# Patient Record
Sex: Male | Born: 1950 | Race: White | Hispanic: No | Marital: Married | Smoking: Former smoker
Health system: Southern US, Community
[De-identification: ages and names within clinical notes are randomized; demographics above are authoritative.]

## PROBLEM LIST (undated history)

## (undated) DIAGNOSIS — M810 Age-related osteoporosis without current pathological fracture: Secondary | ICD-10-CM

## (undated) DIAGNOSIS — Z972 Presence of dental prosthetic device (complete) (partial): Secondary | ICD-10-CM

## (undated) DIAGNOSIS — T7840XA Allergy, unspecified, initial encounter: Secondary | ICD-10-CM

## (undated) DIAGNOSIS — M1990 Unspecified osteoarthritis, unspecified site: Secondary | ICD-10-CM

## (undated) DIAGNOSIS — J410 Simple chronic bronchitis: Secondary | ICD-10-CM

## (undated) DIAGNOSIS — Z8719 Personal history of other diseases of the digestive system: Secondary | ICD-10-CM

## (undated) DIAGNOSIS — Z87442 Personal history of urinary calculi: Secondary | ICD-10-CM

## (undated) DIAGNOSIS — Z86010 Personal history of colonic polyps: Secondary | ICD-10-CM

## (undated) DIAGNOSIS — Z860101 Personal history of adenomatous and serrated colon polyps: Secondary | ICD-10-CM

## (undated) DIAGNOSIS — E039 Hypothyroidism, unspecified: Secondary | ICD-10-CM

## (undated) DIAGNOSIS — K811 Chronic cholecystitis: Secondary | ICD-10-CM

## (undated) DIAGNOSIS — F329 Major depressive disorder, single episode, unspecified: Secondary | ICD-10-CM

## (undated) DIAGNOSIS — R911 Solitary pulmonary nodule: Secondary | ICD-10-CM

## (undated) DIAGNOSIS — F32A Depression, unspecified: Secondary | ICD-10-CM

## (undated) DIAGNOSIS — F419 Anxiety disorder, unspecified: Secondary | ICD-10-CM

## (undated) DIAGNOSIS — Z8616 Personal history of COVID-19: Secondary | ICD-10-CM

## (undated) DIAGNOSIS — K08109 Complete loss of teeth, unspecified cause, unspecified class: Secondary | ICD-10-CM

## (undated) DIAGNOSIS — Z720 Tobacco use: Secondary | ICD-10-CM

## (undated) DIAGNOSIS — K5909 Other constipation: Secondary | ICD-10-CM

## (undated) DIAGNOSIS — E785 Hyperlipidemia, unspecified: Secondary | ICD-10-CM

## (undated) DIAGNOSIS — I6529 Occlusion and stenosis of unspecified carotid artery: Secondary | ICD-10-CM

## (undated) DIAGNOSIS — K219 Gastro-esophageal reflux disease without esophagitis: Secondary | ICD-10-CM

## (undated) DIAGNOSIS — J449 Chronic obstructive pulmonary disease, unspecified: Secondary | ICD-10-CM

## (undated) DIAGNOSIS — K5792 Diverticulitis of intestine, part unspecified, without perforation or abscess without bleeding: Secondary | ICD-10-CM

## (undated) DIAGNOSIS — R0609 Other forms of dyspnea: Secondary | ICD-10-CM

## (undated) DIAGNOSIS — I2510 Atherosclerotic heart disease of native coronary artery without angina pectoris: Secondary | ICD-10-CM

## (undated) DIAGNOSIS — Z973 Presence of spectacles and contact lenses: Secondary | ICD-10-CM

## (undated) DIAGNOSIS — I10 Essential (primary) hypertension: Secondary | ICD-10-CM

## (undated) HISTORY — PX: CAROTID ENDARTERECTOMY: SUR193

## (undated) HISTORY — DX: Essential (primary) hypertension: I10

## (undated) HISTORY — DX: Diverticulitis of intestine, part unspecified, without perforation or abscess without bleeding: K57.92

## (undated) HISTORY — DX: Anxiety disorder, unspecified: F41.9

## (undated) HISTORY — DX: Atherosclerotic heart disease of native coronary artery without angina pectoris: I25.10

## (undated) HISTORY — DX: Solitary pulmonary nodule: R91.1

## (undated) HISTORY — PX: THROAT SURGERY: SHX803

## (undated) HISTORY — DX: Major depressive disorder, single episode, unspecified: F32.9

## (undated) HISTORY — PX: COLONOSCOPY: SHX174

## (undated) HISTORY — DX: Depression, unspecified: F32.A

## (undated) HISTORY — DX: Unspecified osteoarthritis, unspecified site: M19.90

## (undated) HISTORY — DX: Hyperlipidemia, unspecified: E78.5

## (undated) HISTORY — DX: Tobacco use: Z72.0

## (undated) HISTORY — DX: Allergy, unspecified, initial encounter: T78.40XA

## (undated) HISTORY — DX: Age-related osteoporosis without current pathological fracture: M81.0

## (undated) HISTORY — DX: Occlusion and stenosis of unspecified carotid artery: I65.29

## (undated) HISTORY — DX: Gastro-esophageal reflux disease without esophagitis: K21.9

## (undated) HISTORY — PX: INGUINAL HERNIA REPAIR: SHX194

## (undated) HISTORY — PX: OTHER SURGICAL HISTORY: SHX169

---

## 1995-06-24 HISTORY — PX: THROAT SURGERY: SHX803

## 2000-06-23 DIAGNOSIS — I2510 Atherosclerotic heart disease of native coronary artery without angina pectoris: Secondary | ICD-10-CM

## 2000-06-23 HISTORY — DX: Atherosclerotic heart disease of native coronary artery without angina pectoris: I25.10

## 2000-08-04 ENCOUNTER — Ambulatory Visit (HOSPITAL_COMMUNITY): Admission: RE | Admit: 2000-08-04 | Discharge: 2000-08-04 | Payer: Self-pay | Admitting: Cardiology

## 2000-08-11 ENCOUNTER — Encounter: Payer: Self-pay | Admitting: Cardiology

## 2000-08-11 ENCOUNTER — Ambulatory Visit (HOSPITAL_COMMUNITY): Admission: RE | Admit: 2000-08-11 | Discharge: 2000-08-11 | Payer: Self-pay | Admitting: Cardiology

## 2000-08-11 HISTORY — PX: CARDIAC CATHETERIZATION: SHX172

## 2003-01-13 ENCOUNTER — Ambulatory Visit (HOSPITAL_COMMUNITY): Admission: RE | Admit: 2003-01-13 | Discharge: 2003-01-13 | Payer: Self-pay | Admitting: General Surgery

## 2003-06-24 HISTORY — PX: BRONCHOSCOPY: SUR163

## 2004-04-24 ENCOUNTER — Ambulatory Visit: Payer: Self-pay | Admitting: Critical Care Medicine

## 2004-04-25 ENCOUNTER — Ambulatory Visit: Payer: Self-pay | Admitting: Critical Care Medicine

## 2004-04-26 ENCOUNTER — Encounter (INDEPENDENT_AMBULATORY_CARE_PROVIDER_SITE_OTHER): Payer: Self-pay | Admitting: *Deleted

## 2004-04-26 ENCOUNTER — Ambulatory Visit: Payer: Self-pay | Admitting: Critical Care Medicine

## 2004-04-26 ENCOUNTER — Ambulatory Visit: Admission: RE | Admit: 2004-04-26 | Discharge: 2004-04-26 | Payer: Self-pay | Admitting: Critical Care Medicine

## 2004-05-06 ENCOUNTER — Ambulatory Visit (HOSPITAL_COMMUNITY): Admission: RE | Admit: 2004-05-06 | Discharge: 2004-05-06 | Payer: Self-pay | Admitting: Critical Care Medicine

## 2005-10-01 ENCOUNTER — Ambulatory Visit (HOSPITAL_COMMUNITY): Admission: RE | Admit: 2005-10-01 | Discharge: 2005-10-01 | Payer: Self-pay | Admitting: General Surgery

## 2005-10-01 HISTORY — PX: EXCISION NEUROMA: SHX6350

## 2006-07-22 ENCOUNTER — Encounter (INDEPENDENT_AMBULATORY_CARE_PROVIDER_SITE_OTHER): Payer: Self-pay | Admitting: *Deleted

## 2006-07-22 ENCOUNTER — Ambulatory Visit (HOSPITAL_COMMUNITY): Admission: RE | Admit: 2006-07-22 | Discharge: 2006-07-22 | Payer: Self-pay | Admitting: General Surgery

## 2007-04-28 ENCOUNTER — Ambulatory Visit (HOSPITAL_COMMUNITY): Admission: RE | Admit: 2007-04-28 | Discharge: 2007-04-28 | Payer: Self-pay | Admitting: Surgery

## 2007-04-28 HISTORY — PX: LAPAROSCOPIC INGUINAL HERNIA REPAIR: SUR788

## 2007-08-25 ENCOUNTER — Ambulatory Visit: Payer: Self-pay | Admitting: Cardiology

## 2007-09-06 ENCOUNTER — Ambulatory Visit: Payer: Self-pay

## 2007-09-21 ENCOUNTER — Ambulatory Visit: Payer: Self-pay | Admitting: Vascular Surgery

## 2007-09-22 ENCOUNTER — Ambulatory Visit: Payer: Self-pay | Admitting: Cardiology

## 2007-09-24 ENCOUNTER — Encounter: Payer: Self-pay | Admitting: Vascular Surgery

## 2007-09-24 ENCOUNTER — Inpatient Hospital Stay (HOSPITAL_COMMUNITY): Admission: RE | Admit: 2007-09-24 | Discharge: 2007-09-25 | Payer: Self-pay | Admitting: Vascular Surgery

## 2007-09-24 ENCOUNTER — Ambulatory Visit: Payer: Self-pay | Admitting: Vascular Surgery

## 2007-09-24 HISTORY — PX: CAROTID ENDARTERECTOMY: SUR193

## 2007-10-26 ENCOUNTER — Ambulatory Visit: Payer: Self-pay | Admitting: Vascular Surgery

## 2008-03-20 ENCOUNTER — Ambulatory Visit: Payer: Self-pay | Admitting: Cardiology

## 2008-03-27 ENCOUNTER — Ambulatory Visit: Payer: Self-pay | Admitting: Cardiology

## 2008-03-27 LAB — CONVERTED CEMR LAB
BUN: 16 mg/dL (ref 6–23)
Basophils Absolute: 0 10*3/uL (ref 0.0–0.1)
Basophils Relative: 0.4 % (ref 0.0–3.0)
CO2: 27 meq/L (ref 19–32)
Calcium: 9.2 mg/dL (ref 8.4–10.5)
Creatinine, Ser: 1 mg/dL (ref 0.4–1.5)
Eosinophils Absolute: 0.1 10*3/uL (ref 0.0–0.7)
Eosinophils Relative: 1.2 % (ref 0.0–5.0)
Hemoglobin: 17.1 g/dL — ABNORMAL HIGH (ref 13.0–17.0)
Lymphocytes Relative: 32.3 % (ref 12.0–46.0)
MCHC: 35.5 g/dL (ref 30.0–36.0)
MCV: 89.5 fL (ref 78.0–100.0)
Neutro Abs: 6 10*3/uL (ref 1.4–7.7)
RBC: 5.38 M/uL (ref 4.22–5.81)
WBC: 10 10*3/uL (ref 4.5–10.5)
aPTT: 27 s (ref 21.7–29.8)

## 2008-03-30 ENCOUNTER — Inpatient Hospital Stay (HOSPITAL_BASED_OUTPATIENT_CLINIC_OR_DEPARTMENT_OTHER): Admission: RE | Admit: 2008-03-30 | Discharge: 2008-03-30 | Payer: Self-pay | Admitting: Cardiology

## 2008-03-30 ENCOUNTER — Ambulatory Visit: Payer: Self-pay | Admitting: Cardiology

## 2008-03-30 HISTORY — PX: CARDIAC CATHETERIZATION: SHX172

## 2008-04-20 ENCOUNTER — Ambulatory Visit: Payer: Self-pay | Admitting: Cardiology

## 2008-06-07 ENCOUNTER — Ambulatory Visit (HOSPITAL_COMMUNITY): Admission: RE | Admit: 2008-06-07 | Discharge: 2008-06-07 | Payer: Self-pay | Admitting: Urology

## 2008-06-08 ENCOUNTER — Ambulatory Visit (HOSPITAL_COMMUNITY): Admission: RE | Admit: 2008-06-08 | Discharge: 2008-06-08 | Payer: Self-pay | Admitting: Urology

## 2009-02-07 ENCOUNTER — Encounter (INDEPENDENT_AMBULATORY_CARE_PROVIDER_SITE_OTHER): Payer: Self-pay | Admitting: *Deleted

## 2009-04-19 ENCOUNTER — Encounter: Payer: Self-pay | Admitting: Cardiology

## 2009-04-19 DIAGNOSIS — E785 Hyperlipidemia, unspecified: Secondary | ICD-10-CM | POA: Insufficient documentation

## 2009-04-19 DIAGNOSIS — I6529 Occlusion and stenosis of unspecified carotid artery: Secondary | ICD-10-CM | POA: Insufficient documentation

## 2009-04-19 DIAGNOSIS — F17200 Nicotine dependence, unspecified, uncomplicated: Secondary | ICD-10-CM | POA: Insufficient documentation

## 2009-04-19 DIAGNOSIS — I2510 Atherosclerotic heart disease of native coronary artery without angina pectoris: Secondary | ICD-10-CM | POA: Insufficient documentation

## 2009-04-20 ENCOUNTER — Ambulatory Visit: Payer: Self-pay | Admitting: Cardiology

## 2009-04-20 ENCOUNTER — Ambulatory Visit: Payer: Self-pay

## 2010-05-03 ENCOUNTER — Encounter: Payer: Self-pay | Admitting: Cardiology

## 2010-05-06 ENCOUNTER — Ambulatory Visit: Payer: Self-pay | Admitting: Cardiology

## 2010-05-06 DIAGNOSIS — N529 Male erectile dysfunction, unspecified: Secondary | ICD-10-CM | POA: Insufficient documentation

## 2010-06-10 ENCOUNTER — Encounter: Payer: Self-pay | Admitting: Cardiology

## 2010-06-10 ENCOUNTER — Ambulatory Visit: Payer: Self-pay

## 2010-07-13 ENCOUNTER — Encounter: Payer: Self-pay | Admitting: Critical Care Medicine

## 2010-07-23 NOTE — Miscellaneous (Signed)
  Clinical Lists Changes  Observations: Added new observation of US CAROTID: Stable carotid artery disease bilaterally, with mild smooth plaque, bilaterally 40-59% RICA stenosis 0-39% LICA stenosis Severe left subclavian artery stenosis, with vetebral steal  f/u 1 year (04/20/2009 8:42)      Carotid Doppler  Procedure date:  04/20/2009  Findings:      Stable carotid artery disease bilaterally, with mild smooth plaque, bilaterally 40-59% RICA stenosis 0-39% LICA stenosis Severe left subclavian artery stenosis, with vetebral steal  f/u 1 year

## 2010-07-23 NOTE — Assessment & Plan Note (Signed)
Summary: 1 yr rov in [place] office 414.01   Visit Type:  Follow-up Primary Provider:  Lindaann Pascal, PAc  CC:  CAD.  History of Present Illness: The patient  returns for yearly followup.  Since I last saw him he has had no new cardiovascular complaints. He still works. 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 This is very exerting and with this he does not have chest pressure, neck or arm discomfort. He does not have palpitations, presyncope or syncope. He does not have shortness of breath, PND or orthopnea. He does not have weight gain or edema. He unfortunately continues to smoke cigarettes despite giving him a prescription of Chantix last year. \ Current Medications (verified): 1)  Imdur 30 Mg Xr24h-Tab (Isosorbide Mononitrate) .... Daily 2)  Multivitamins   Tabs (Multiple Vitamin) .... Daily 3)  Aspirin 81 Mg  Tabs (Aspirin) .... Daily 4)  Flax Seed Oil 1000 Mg Caps (Flaxseed (Linseed)) .... Daily 5)  Prilosec 20 Mg Cpdr (Omeprazole) .... Daily 6)  Aleve 220 Mg Tabs (Naproxen Sodium) .... As Needed 7)  Pro-Flora Concentrate  Caps (Probiotic Product) .Marland Kitchen.. 1 By Mouth Daily 8)  Citracal Plus  Tabs (Multiple Minerals-Vitamins) .Marland Kitchen.. 1 By Mouth Daily 9)  Vitamin D3 1000 Unit Tabs (Cholecalciferol) .Marland Kitchen.. 1 By Mouth Dailhy 10)  Saw Palmetto 500 Mg Caps (Saw Palmetto (Serenoa Repens)) .Marland Kitchen.. 1 By Mouth Daily 11)  Lipitor 10 Mg Tabs (Atorvastatin Calcium) .Marland Kitchen.. 1 By Mouth Daily 12)  Imdur 30 Mg Xr24h-Tab (Isosorbide Mononitrate) .Marland Kitchen.. 1 By Mouth Daily 13)  Glucosamine-Chondroitin 250-200 Mg Caps (Glucosamine-Chondroitin) .Marland Kitchen.. 1 By Mouth Daily 14)  Vitamin E 400 Unit Caps (Vitamin E) .Marland Kitchen.. 1 By Mouth Dialy  Allergies (verified): No Known Drug Allergies  Past History:  Past Medical History: Reviewed history from 04/19/2009 and no changes required.  Coronary artery disease (left main had luminal   irregularities, the LAD had 30% stenosis, the first diagonal had   moderate-sized with ostial 35% stenosis, the circumflex in the AV groove   had ostial 25% stenosis, the first obtuse marginal had long proximal 30%   stenosis, the mid obtuse marginal was normal, the right coronary artery   was dominant and occluded proximally.  There was excellent left-to-right   collateral filling.  The EF was 55% with inferior hypokinesis.),   dyslipidemia, benign lung nodule, ongoing tobacco abuse (he says he has   down on a half-pack a day. This is down to half of his previous   tobacco), carotid stenosis (patchy angioplasty on the right carotid with   40-59% right stenosis, left stenosis normal, severe left subclavian   stenosis with vertebral steal)  Past Surgical History: Reviewed history from 04/19/2009 and no changes required.  Throat surgery, left inguinal herniorrhaphy in 2004.  Review of Systems       Erectile dysfunction. Otherwise as stated in the history of present illness and negative for all other systems.  Vital Signs:  Patient profile:   60 year old male Height:      71 inches Weight:      161 pounds BMI:     22.54 Pulse rate:   93 / minute Resp:     16 per minute BP sitting:   144 / 76  (right arm)  Vitals Entered By: Marrion Coy, CNA (May 06, 2010 11:09 AM)  Physical Exam  General:  Well developed, well nourished, in no acute distress. Head:  normocephalic and atraumatic Eyes:  PERRLA/EOM intact; conjunctiva  and lids normal. Mouth:  Teeth, gums and palate normal. Oral mucosa normal. Neck:  Neck supple, no JVD. No masses, thyromegaly or abnormal cervical nodes, Well-healed right CEA scar Chest Wall:  no deformities or breast masses noted   Detailed Cardiovascular Exam  Neck    Carotids: back left carotid bruit, otherwise unremarkable    Neck Veins: Normal, no JVD.    Heart    Inspection: no deformities or lifts noted.      Palpation: normal PMI with no thrills palpable.      Auscultation: regular rate and rhythm, S1, S2  without murmurs, rubs, gallops, or clicks.    Vascular    Abdominal Aorta: no palpable masses, pulsations, or audible bruits.      Femoral Pulses: normal femoral pulses bilaterally.      Pedal Pulses: normal pedal pulses bilaterally.      Peripheral Circulation: no clubbing, cyanosis, or edema noted with normal capillary refill.     EKG  Procedure date:  05/06/2010  Findings:      Sinus rhythm, rate 93, rightward axis, no acute ST-T wave changes.  Impression & Recommendations:  Problem # 1:  CAD (ICD-414.00) He has no ongoing symptoms. No further cardiovascular testing is suggested. He will continue with risk reduction.  Problem # 2:  CAROTID ARTERY DISEASE (ICD-433.10)  He is overdue for follow up and I will schedule this.  Problem # 3:  TOBACCO ABUSE (ICD-305.1) We again discussed the need to stop smoking. He says he doesn't think he will ever do this because he enjoys it too much.  Problem # 4:  DYSLIPIDEMIA (ICD-272.4) His lipids are followed by his primary physician. The goal should be an LDL less than 70 and HDL greater than 50.  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  Other Orders: Carotid Duplex (Carotid Duplex)  Patient Instructions: 1)  Your physician recommends that you schedule a follow-up appointment in: 12 months 2)  Your physician has recommended you make the following change in your medication: Start metoprolol succinate 25 mg by mouth daily. 3)  Viagra 50 mg by mouth as needed as directed. 4)  Your physician has requested that you have a carotid duplex. This test is an ultrasound of the carotid arteries in your neck. It looks at blood flow through these arteries that supply the brain with blood. Allow one hour for this exam. There are no restrictions or special instructions. Prescriptions: METOPROLOL SUCCINATE 25 MG XR24H-TAB (METOPROLOL SUCCINATE) Take one  tablet by mouth daily  #30 x 6   Entered by:   Dossie Arbour, RN, BSN   Authorized by:   Rollene Rotunda, MD, [facility]   Signed by:   Dossie Arbour, RN, BSN on 05/06/2010   Method used:   Faxed to ...       Hospital doctor (retail)       [address]       [place], [place]  [address]       Ph: [phone] or [phone]       Fax: [phone]   RxID:   [id number] VIAGRA 50 MG TABS (SILDENAFIL CITRATE) take one as needed as directed  #20 x 0   Entered by:   Dossie Arbour, RN, BSN   Authorized by:   Rollene Rotunda, MD, [facility]   Signed by:  Dossie Arbour, RN, BSN on 05/06/2010   Method used:   Faxed to ...       Hospital doctor (retail)       [address]       [place], [place]  [address]       Ph: [phone] or [phone]       Fax: [phone]   RxID:   [id number]  I have reviewed and approved all prescriptions at the time of this visit. Rollene Rotunda, MD, [facility]  May 06, 2010 11:58 AM

## 2010-07-25 NOTE — Medication Information (Signed)
Summary: Prescription Refill   Prescription Refill   Imported By: Roderic Ovens 06/04/2010 10:42:47  _____________________________________________________________________  External Attachment:    Type:   Image     Comment:   External Document

## 2010-11-05 NOTE — Op Note (Signed)
NAME:  [name] [name], [name] [name]            ACCOUNT NO.:  [id number]   MEDICAL RECORD NO.:  [id number]          PATIENT TYPE:  INP   LOCATION:  2550                         FACILITY:  [facility]   PHYSICIAN:  Di Kindle. Edilia Bo, M.D.DATE OF BIRTH:  08-Aug-1950   DATE OF PROCEDURE:  09/24/2007  DATE OF DISCHARGE:                               OPERATIVE REPORT   PREOPERATIVE DIAGNOSIS:  Greater than 80% right carotid stenosis.   POSTOPERATIVE DIAGNOSIS:  Greater than 80% right carotid stenosis.   PROCEDURE:  Right carotid endarterectomy with Dacron patch angioplasty.   SURGEON:  Di Kindle. Edilia Bo, M.D.   ASSISTANT:  Wilmon Arms, PA   ANESTHESIA:  General.   INDICATIONS:  This is a 60 year old gentleman who was seen by Dr.  Antoine Poche with coronary artery disease.  He had bilateral carotid bruits  and this prompted a carotid duplex scan which showed a greater than 80%  right carotid stenosis.  Right carotid endarterectomy was recommended in  order to lower his risk of future stroke.  The procedure and potential  complications including but not limited to bleeding, stroke  (periprocedural risk 1-2%), nerve injury, MI, or other unpredictable  medical problems were discussed with the patient preoperatively.  All of  his questions were answered and he was agreeable to proceed.   TECHNIQUE:  The patient was taken to the operating room and received a  general anesthetic.  Arterial line had been placed by anesthesia.  After  careful positioning the right neck was prepped and draped in the usual  sterile fashion.  An incision was made along the anterior border of the  sternocleidomastoid and the dissection carried down to the common  carotid artery which was dissected free and controlled with a Rumel  tourniquet.  The facial vein was divided between 2-0 silk ties.  The  internal carotid artery was controlled above the plaque.  Of note, the  bifurcation was somewhat high.  The hypoglossal  nerve was identified and  protected.  The patient was heparinized.  Clamps were then placed on the  internal and the external and the common carotid artery.  A longitudinal  arteriotomy was made in the common carotid artery.  This was extended  through the plaque into the internal carotid artery above the plaque.  A  10 Bard shunt was placed into the internal carotid artery and back bled  and placed in the common carotid artery and secured with a Rumel  tourniquet.  Flow was reestablished through the shunt.  An  endarterectomy plane was established proximally and the plaque was  sharply divided.  Eversion endarterectomy was performed of the external  carotid artery.  Distally there was a nice taper in the plaque and no  tacking sutures were required.  The artery was irrigated with copious  amounts of heparin and dextran and all loose debris removed.  A Dacron  patch was then sewn on using continuous 6-0 Prolene suture.  Prior to  completing the patch closure the shunt was removed.  The artery back  bled and flushed appropriately and the anastomosis completed.  Flow was  reestablished first  to the external carotid artery and into the internal  carotid artery.  At completion there was a good pulse distal to the  patch  and good Doppler flow.  Hemostasis was obtained in the wound.  Heparin  was partially reversed with protamine.  The wound was closed with a deep  layer of 3-0 Vicryl.  The skin closed with 4-0 Vicryl.  Sterile dressing  was applied.  The patient tolerated the procedure well and awoke  neurologically intact.      Di Kindle. Edilia Bo, M.D.  Electronically Signed     CSD/MEDQ  D:  09/24/2007  T:  09/24/2007  Job:  [id number]   cc:   Rollene Rotunda, MD, [facility], [facility]

## 2010-11-05 NOTE — Consult Note (Signed)
NEW PATIENT CONSULTATION   [name] [name], [name] [name]  DOB:  [birth year]                                       09/21/2007  [id number]#:[id number]   I saw the patient in the office today in consultation concerning  bilateral carotid disease.  This is a pleasant 60 year old gentleman who  was seen by Dr. Antoine Poche on August 25, 2007 as he was having some  intermittent chest pain.  On exam, Dr. Antoine Poche detected carotid bruits  and this prompted a duplex scan, which was done at the [facility], which  showed a greater than 80% right carotid stenosis with a 60-79% left  carotid stenosis.  He was sent for a vascular consultation.   The patient is left-handed.  He denies any history of stroke, TIA,  expressive or receptive aphasia, amaurosis fugax, or dizziness.   With respect to his chest pain, he says that he usually gets it in the  morning when he wakes up, and apparently has a history of reflux, which  is being treated medically.  Dr. Antoine Poche did set him up for a stress  test, which was an overall low-risk scan.  He has had a previous cardiac  catheterization by Dr. Riley Kill, which showed the left main was normal.  The first diagonal had a 40% osteal stenosis.  The circumflex was  normal.  The right coronary artery was the dominant vessel.  The PDA had  a 70-75% focal proximal stenosis.  He was managed medically after his  heart cath.   PAST MEDICAL HISTORY:  Significant for hypertension,  hypercholesterolemia, coronary artery disease.  He denies any history of  diabetes, history of previous myocardial infarction, history of  congestive heart failure, or history of COPD.   FAMILY HISTORY:  There is no history of premature cardiovascular  disease.  His father died of lung disease at age 5.   SOCIAL HISTORY:  He is married and he has 4 children.  He works as a  [employer].  He smokes a pack per day of cigarettes and has been  smoking for over 35 years.   ALLERGIES:  No known  drug allergies.   MEDICATIONS:  1. Amlodipine 1 p.o. daily.  2. Ciprodex 1 p.o. daily.  3. Vytorin 1 p.o. daily.  4. Aspirin 1 p.o. daily.  He does not have the dosages of his      medications, but will bring them when he comes for surgery.   REVIEW OF SYSTEMS:  GENERAL:  He has had no recent weight loss, weight  gain, or problems with his appetite.  He is 158 pounds, 5 feet 11 inches  tall.  CARDIAC:  He has had some occasional chest pain as described above.  He  has had no significant dyspnea at rest.  He does have some dyspnea on  exertion.  He has had no orthopnea or arrhythmias.  PULMONARY:  He has had no recent productive cough, bronchitis, asthma,  or wheezing.  GI:  He has had no recent change in his bowel habits.  Has no history of  peptic ulcer disease.  GU:  He has had no dysuria or frequency.  VASCULAR:  He denies any claudication, rest pain, or non-healing ulcers.  He has had no history of stroke.  He has no history of DVT or phlebitis.  NEURO:  He has had  no dizziness, black outs, headaches, or seizures.  ORTHO:  He does have some arthritis in his knees.  HEMATOLOGICAL:  He has had no bleeding problems or clotting disorders.   PHYSICAL EXAMINATION:  This is a pleasant 59 year old gentleman who  appears his stated age.  Blood pressure is 113/83, heart rate is 73,  saturation 99%.  Neck is supple.  He has bilateral carotid bruits.  Lungs are clear bilaterally to auscultation.  On cardiac exam, he has a  regular rate and rhythm.  Abdomen is soft and nontender.  He has normal-  pitched bowel sounds.  I cannot palpate an abdominal aortic aneurysm.  He has normal femoral pulses.  Both feet are warm and well-perfused.  He  has no significant lower extremity swelling.  Neurologic exam is  nonfocal.   Carotid duplex scan shows some intimal thickening throughout the common  carotid arteries.  On the right side, he has a greater than 80% carotid  stenosis.  This is just  distal to the origin of the right ICA.  Plaque  appears to extend 1.5 to 2 cm up the internal carotid artery.  On the  left side, he has a 60-79% stenosis.  Of note, he is known to have arm  pressure differential with pressure in the left arm 113/83 in the right  arm 153/89.  On carotid duplex scan, he had monophasic flow in the left  arm with some to and for flow noted in the left vertebral artery,  possibly consistent with subclavian steel.   With respect to his carotid disease, stenosis on the right is greater  than 80%, and I have recommended right carotid endarterectomy in order  to lower his risk of future stroke.  We had discussed the indications  for the procedure and the potential complications, including but not  limited to bleeding, wound healing problems, MI, stroke (periprocedural  risk 1 to 2%), nerve injury, or other unpredictable medical problems.  All of his questions are answered and he is agreeable to proceeding.  He  will continue his aspirin right up through surgery.  With respect to his  left carotid stenosis, this is less than 80% and I recommended followup  duplex scan in 6 months.  Would have to consider left carotid  endarterectomy if he develops left hemispheric symptoms or the stenosis  progressed to greater than 80%.  With respect to his left subclavian  stenosis, this appears to be asymptomatic and no further workup is  needed.   The patient is scheduled to see Dr. Antoine Poche tomorrow and, pending his  approval, will tentatively plan on proceeding with surgery on  09/24/2007.   Di Kindle. Edilia Bo, M.D.  Electronically Signed   CSD/MEDQ  D:  09/21/2007  [name]:  09/22/2007  Job:  [id number]   cc:   Rollene Rotunda, MD, [facility]  Dr Lindaann Pascal

## 2010-11-05 NOTE — Assessment & Plan Note (Signed)
OFFICE VISIT   [name], [name] T  DOB:  14-Jun-1951                                       10/26/2007  EAVWU#:[id number]   I saw the patient in the office today for followup after his recent  right carotid endarterectomy.  He is a pleasant 60 year old gentleman  who was being seen by Dr. Antoine Poche with coronary artery disease.  He  noted bilateral carotid bruits and this prompted a duplex scan which  showed a greater than 80% right carotid stenosis with a 60-79% left  carotid stenosis.  Right carotid endarterectomy was recommended in order  to lower his risk of future stroke.   He underwent a right carotid endarterectomy with Dacron patch  angioplasty on 09/24/2007.  He did well postoperatively and was  discharged on postoperative day #1.  He returns for his first followup  visit.  He has no specific complaints and has been gradually resuming  his normal activities.  He has had no focal weakness or paresthesias.   PHYSICAL EXAMINATION:  Vital signs:  Blood pressure is 152/98, heart  rate is 93.  Neck:  His right neck incision is healing nicely.  Neurologic:  Exam is nonfocal.  Lungs:  Are clear bilaterally to  auscultation.   Overall I am pleased with his progress and I will see him back in 6  months for a followup duplex scan.  He knows to call sooner if he has  problems.  In the meantime he knows to continue taking his aspirin.  We  will also follow his, continue to follow his moderate left carotid  stenosis.  He understands we would not consider a left carotid  endarterectomy unless the stenosis progressed to greater than 80%.   Di Kindle. Edilia Bo, M.D.  Electronically Signed   CSD/MEDQ  D:  10/26/2007  T:  10/27/2007  Job:  [id number]   cc:   Rollene Rotunda, MD, [facility], [address]

## 2010-11-05 NOTE — Op Note (Signed)
NAME:  [name] [name], [name] [name]            ACCOUNT NO.:  [id number]   MEDICAL RECORD NO.:  [id number]          PATIENT TYPE:  AMB   LOCATION:  DAY                          FACILITY:  [facility]   PHYSICIAN:  Ardeth Sportsman, MD     DATE OF BIRTH:  [birth year]   DATE OF PROCEDURE:  04/28/2007  DATE OF DISCHARGE:                               OPERATIVE REPORT   REFERRING PHYSICIAN:  Timothy E. Earlene Plater, M.D.   PRIMARY CARE PHYSICIAN:  [facility].   SURGEON:  Ardeth Sportsman, M.D.   ASSISTANTS:  None.   PREOPERATIVE DIAGNOSIS:  Bilateral inguinal hernias (left recurrent and  right new).   POSTOPERATIVE DIAGNOSIS:  Bilateral inguinal hernias (left recurrent and  right new).   PROCEDURE PERFORMED:  Laparoscopic bilateral hernia repair.   ANESTHESIA:  1. General anesthesia.  2. Local anesthetic in a field block around all port sites.  3. Bilateral ilioinguinal/genitofemoral/cord nerve blocks.   SPECIMENS:  None.   DRAINS:  None.   ESTIMATED BLOOD LOSS:  Approximately 5 mL.   COMPLICATIONS:  None apparent.   INDICATIONS:  Mr. Bartling is a pleasant 60 year old gentleman who had  has had left inguinal hernia repairs x2, the most recent one done in  2004, with evidence of recurrence, and also with a the right inguinal  hernia.  He was seen by my partner, Dr. Kendrick Ranch, who referred the  patient to me for a laparoscopic repair.   The anatomy and physiology of abdominal formation, testicular migration  was explained.  Pathophysiology of inguinal herniation with its risks of  incarceration, strangulation and debilitating pain and function were  discussed.  Options discussed and recommendations made for a  laparoscopic preperitoneal exploration with left and probable right  inguinal hernia repair.  Risks such as stroke, heart attack, deep venous  thrombosis, pulmonary embolism and death were discussed.  Risks such as  bleeding, need for transfusion, wound infection,  abscess, injury to the  nerves, prolonged pain, chronic nerve pain, hernia recurrence and other  risks were discussed.  Questions answered and he agreed to proceed.   OPERATIVE FINDINGS:  He had evidence of a moderate-sized direct hernia  defect on the left side that looked like he had some chronic  inflammation in the area.  He had no evidence of an indirect defect or  femoral obturator defect.  On the right he a small but definite indirect  inguinal hernia defect as well.   DESCRIPTION OF PROCEDURE:  Informed consent was confirmed.  The patient  received IV cefazolin just prior to induction.  He had sequential  compression devices active during the entire case.  He was positioned  supine, both arms tucked.  He underwent general anesthesia without any  difficulty.  His abdomen was clipped, prepped and draped in a sterile  fashion.   Entry was gained in the abdomen through an infraumbilical curvilinear  incision.  Weston Brass was made in the anterior rectus fascia just right of  midline and a 12 mm Hasson port was passed in the preperitoneal plane.   Capnopreperitoneum to 15 mmHg provided good abdominal wall  insufflation.  Camera dissection was used to help free the peritoneum off the anterior  abdominal wall and enough working space was created such that a 5 mm  port could be placed in the left midabdomen and right midabdomen.   Attention was turned to the left recurrent side.  It was freed off  laterally as well as down to the level of the pubis.  A window was made  in the true pelvis between the anterolateral pelvic wall and the  anterolateral bladder.  This exposed the level around the obturator  foramen well.  Peritoneum could be seen coming up into a direct hernia  defect.  The peritoneum was rather thin and there was unfortunately 3  small tears.  I was able to get around the hernia sac completely and see  that it was spared from the spermatic vessels and vas deferens.  Therefore,  using some controlled sharp dissection, I was able to get  into the hernia sac and reduce some incarcerated omentum and some  incarcerated fatty contents well.  The sac was completely reduced back  in.  The resulting peritoneal defect was closed using 3-0 Vicryl  stitches using intracorporeal laparoscopic suturing to good result.  Peritoneum was peeled proximally off the cord structures and posterior  as well.   Attention was turned towards the right side.  There was no evidence of a  direct hernia defect, however  there was a small but definite indirect  hernia sac defect.  This was able to be freed off the cord structures  and reduced posteriorly as well.   A 15 x 15 cm lightweight polypropylene (ULTRAPRO) mesh was used for each  side.  Each mesh was cut to a half-skull shape such that there was a  medial and inferior flap that rested down in the true pelvis between the  anterolateral bladder and anterolateral pelvic wall.  Midline raphe  structures on the bladder were kept intact to avoid over-dissection of  the bladder.  The mesh laid well laterally, superiorly and medially as  well as proximally/posteriorly, such that there was at least 3 inches  circumferential coverage around the mesh.  Lead points of the hernia  sacs were grasped from both sides and elevated anteriorly and proximally  as capnopreperitoneum was evacuated.  The ports were removed.  The  infraumbilical fascial defect was closed using a 0 Vicryl stitch x2.  Skin was closed using a 4-0 Monocryl stitch.  Sterile dressing was  applied.  The patient was extubated and sent to the recovery room in  stable condition.   I explained the operative findings to the patient's wife.  Postoperative  instructions were given.  Questions answered.  She expressed  understanding and appreciation.      Ardeth Sportsman, MD  Electronically Signed     SCG/MEDQ  D:  04/28/2007  T:  04/29/2007  Job:  [id number]

## 2010-11-05 NOTE — Assessment & Plan Note (Signed)
[facility]                            CARDIOLOGY OFFICE NOTE   [name] [name], [name] [name]                   MRN:          [id number]  DATE:08/25/2007                            DOB:          1951-05-22    REFERRING PHYSICIAN:  Lorin Picket Long, PA   REASON FOR PRESENTATION:  Evaluate the patient with chest pain and  coronary disease.   HISTORY OF PRESENT ILLNESS:  The patient is an 60 year old gentleman who  was seen in the [place] office in 2007 apparently.  He had a stress test he  describes at that time.  I do not have the results of this but do know  that he subsequently had cardiac catheterization by Dr. Riley Kill.  This  demonstrated left main was normal, the LAD included a first diagonal  with ostial 40% stenosis, circumflex was normal, right coronary artery  which was a dominant vessel.  There was 30% plaquing in a proximal mid  segment, there was a PDA that had 70-75% focal proximal stenosis  followed by 50% stenosis.  The patient was managed medically.  He has  since done well.  He, however, is now referred for chest discomfort.  This has been going on for several weeks.  Seems to be happening more in  the early morning but he is noticing it sporadically during the day.  Seems to be increasing in frequency.  It happens at rest.  He is active.  With his exerting activity, he does not bring this chest discomfort on.  He describes a left-sided discomfort.  Maybe a burning.  It is up to the  left shoulder.  May last for about a minute.  May be 8/10 in intensity.  He not sure whether it is similar to previous symptoms.  There has been  no associated nausea, vomiting or diaphoresis.  There has been no  palpitation, presyncope, syncope.  It goes away on its own.  He does not  notice it with foods.  He does have reflux, but does not think this is  that.  He has not had any burping.   PAST MEDICAL HISTORY:  Hyperlipidemia recently treated, benign lung  nodule  biopsied, nonobstructive coronary disease as described.   PAST SURGICAL HISTORY:  Laparoscopic inguinal hernia repair.   ALLERGIES:  None.   MEDICATIONS:  1. Prilosec 20 mg a day.  2. Cipro 250 mg b.i.d.  3. Crestor 10 mg daily.   SOCIAL HISTORY:  The patient is a Naval architect.  He is married.  He has  four children.  He has been smoking one pack or more per day for 40  years.   FAMILY HISTORY:  Noncontributory for early coronary artery disease.  Father died of lung disease at age 49.   REVIEW OF SYSTEMS:  As stated in the HPI.  Positive for reflux,  constipation, joint pains.  Negative for other systems.   PHYSICAL EXAMINATION:  The patient is in no acute distress.  Blood pressure 154/88, heart rate 74 and regular, weight 163 pounds,  body mass index 22.  HEENT:  Eyelids unremarkable.  Pupils are equal,  round, and reactive to  light and accommodation.  Fundi are not visualized.  Oral mucosa  unremarkable.  NECK:  No jugular venous distension at 45 degrees, carotid upstroke  brisk and symmetric, left carotid bruit, no thyromegaly.  LYMPHATICS:  No cervical, axillary, or inguinal adenopathy.  LUNGS:  Clear to auscultation bilaterally.  BACK:  No costovertebral angle tenderness.  CHEST:  Unremarkable.  HEART:  PMI not displaced or sustained, S1 and S2 within normal limits,  no S3, no S4, no clicks, rubs, murmurs.  ABDOMEN:  Flat, positive bowel sounds, normal in frequency and pitch, no  bruits, rebound, guarding.  No midline pulsatile mass, hepatomegaly,  splenomegaly.  SKIN:  No rashes, no nodules.  EXTREMITIES:  With 2+ pulses throughout, no edema, cyanosis, clubbing.  NEURO:  Oriented to person, place, and time, cranial nerves 2-12 grossly  intact, motor grossly intact.   EKG sinus rhythm, rate 78, axis within normal limits, intervals within  normal limits, no acute ST wave change.   ASSESSMENT/PLAN:  1. Chest discomfort, the patient's chest comfort has some atypical       features but also some worrisome features.  He has known small      vessel and nonobstructive disease.  He has ongoing risk factors.      Therefore, the pretest probability that this could be related to      obstructive coronary disease is moderately high.  He needs a stress      perfusion study for further evaluation.  He knows to present to the      ER with any increased symptoms.  2. Dyslipidemia.  He has just started Crestor.  We discussed this and      the need to treat his significant elevated LDL (177).  HDL was 38.  3. Tobacco.  We discussed this (greater than 3 minutes).  He has no      desire to quit smoking and so he will not.  4. Hypertension.  Blood pressure is slightly elevated here, but has      not been otherwise.  He can have this followed clinically and      treated with a beta blockers as a first choice if he has continued      hypertension.  5. A carotid bruit.  He will get a Doppler.  This has not been noted      before.  6. Followup will be as needed based on the results of the above.     Rollene Rotunda, MD, [facility]  Electronically Signed    JH/MedQ  DD: 08/25/2007  DT: 08/25/2007  Job #: [id number]   cc:   Lindaann Pascal, PA

## 2010-11-05 NOTE — Cardiovascular Report (Signed)
NAME:  [name] [name], [name] [name]            ACCOUNT NO.:  [id number]   MEDICAL RECORD NO.:  [id number]          PATIENT TYPE:  OIB   LOCATION:  1961                         FACILITY:  [facility]   PHYSICIAN:  Rollene Rotunda, MD, FACCDATE OF BIRTH:  08-09-1950   DATE OF PROCEDURE:  03/30/2008  DATE OF DISCHARGE:                            CARDIAC CATHETERIZATION   PRIMARY CARE PHYSICIAN:  Lindaann Pascal, [place], [facility].   PROCEDURE:  Left heart catheterization/coronary arteriography.   INDICATIONS:  Evaluated the patient with unstable angina.  He had had  previous nonobstructive disease.  He had some mild possible ischemia and  Cardiolite.  He presented with progressive symptoms.   PROCEDURE NOTE:  Left heart catheterization was performed in the right  femoral artery.  He was cannulated using the anterior wall puncture.  A  #4-French arterial sheath was inserted via the modified Seldinger  technique.  Preformed Judkins and pigtail catheter utilized.  The  patient tolerated the procedure well and left the lab in stable  condition.   RESULTS:  Hemodynamics LV 127/29, AO 121/90.   Coronaries:  Left main had luminal irregularities.  The LAD had 30%  stenosis.  First diagonal was moderate size with ostial 35 stenosis.  Circumflex in the AV groove had ostial 25% stenosis.  First obtuse  marginal was large with long proximal 30% stenosis.  Mid obtuse marginal  was small and normal.  The right coronary artery was dominant.  It was  occluded proximally.  The PDA was large and normal.  There was excellent  collateral filling from left-to-right.   Left ventriculogram:  The left ventriculogram was obtained in the RAO  projection.  The EF was about 55% with inferior hypokinesis, mild.   CONCLUSION:  Severe single-vessel coronary artery disease.  Mildly  reduced ejection fraction.   PLAN:  The patient needs aggressive risk factor.  I will talk to him at  length about stopping  smoking, but he does not think he can.  We will  address all other risk factors as well.      Rollene Rotunda, MD, [facility]  Electronically Signed     JH/MEDQ  D:  03/30/2008  T:  03/30/2008  Job:  [id number]

## 2010-11-05 NOTE — Assessment & Plan Note (Signed)
[facility]                            CARDIOLOGY OFFICE NOTE   [name] [name], [name] [name]                   MRN:          [id number]  DATE:04/20/2008                            DOB:          14-Jan-1951    PRIMARY CARE PHYSICIAN:  Lindaann Pascal, PA, [facility]   REASON FOR PRESENTATION:  Evaluate the patient with coronary artery  disease status post catheterization.   HISTORY OF PRESENT ILLNESS:  The patient returns for followup after  having had a catheterization on March 30, 2008.  The anatomy is  described below.  He says he has done well.  He had no problems with  catheterization.  He had some mild leg bruising but this has resolved.  He has had no groin pain.  He has had no further discomfort that he  still claims to be heartburn.  He has had none of the chest discomfort,  neck or arm discomfort.  He has had no palpitations, presyncope, or  syncope.  He has had no PND or orthopnea.  He has been active.  He did  get placed on Imdur and thinks this seems to have improved things.   PAST MEDICAL HISTORY:  Coronary artery disease (left main had luminal  irregularities, the LAD had 30% stenosis, the first diagonal had  moderate-sized with ostial 35% stenosis, the circumflex in the AV groove  had ostial 25% stenosis, the first obtuse marginal had long proximal 30%  stenosis, the mid obtuse marginal was normal, the right coronary artery  was dominant and occluded proximally.  There was excellent left-to-right  collateral filling.  The EF was 55% with inferior hypokinesis.),  dyslipidemia, benign lung nodule, ongoing tobacco abuse (he says he has  down on a half-pack a day. 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   ALLERGIES:  None.   MEDICATIONS:  1. Prilosec.  2. Amlodipine 5 mg daily.  3. Vytorin 10/40 daily.  4. Aspirin 81 mg daily.  5. Multivitamin.  6. Isosorbide 30 mg daily.   REVIEW OF SYSTEMS:  As stated in the HPI and otherwise negative for  other systems.   PHYSICAL EXAMINATION:  GENERAL:  The patient is in no distress.  VITAL SIGNS:  Blood pressure 128/84, heart rate 87 and regular, weight  157 pounds, body mass index 21.  HEENT:  Eyelids are unremarkable, pupils are equal, round and reactive  to light, fundi not visualized, oral mucosa unremarkable.  NECK:  No jugular distention at 40, well-healed right carotid  endarterectomy scar, left carotid bruit, no thyromegaly.  LYMPHATICS:  No adenopathy.  LUNGS:  Clear to auscultation bilaterally.  CHEST:  Unremarkable.  HEART:  PMI not displaced or sustained, S1 and S2 within normals, no S3,  no S4, no clicks, no rubs, no murmurs.  ABDOMEN:  Flat, positive bowel sounds normal in frequency and pitch, no  bruits, no rebound, no guarding  or midline pulsatile mass, no  organomegaly.  SKIN:  No rashes, no nodules.  EXTREMITIES:  2+ pulses, no edema, no cyanosis or clubbing.  NEURO:  Grossly intact.   ASSESSMENT AND PLAN:  1. Coronary artery disease.  The patient has coronary artery disease      as described.  I am going to continue to emphasize secondary risk      reduction.  No further cardiovascular testing is planned.  2. Tobacco.  He has cut down on his cigarettes per medically and I      have plot this.  However, he needs to completely quit.  3. Dyslipidemia per [facility].  Goal LDL should be less than      70 given his severe disease with ongoing tobacco abuse with an HDL      greater than 40.  4. Peripheral vascular disease is followed by Dr. Edilia Bo.  He is due      to have a carotid Doppler again in 1 year.  5. Follow-up.  I will see him back in 6 months or sooner if  needed.     Rollene Rotunda, MD, [facility]  Electronically Signed    JH/MedQ  DD: 04/20/2008  DT: 04/21/2008  Job #: [id number]   cc:   Lindaann Pascal, PA

## 2010-11-05 NOTE — Discharge Summary (Signed)
NAME:  [name] [name], [name] [name]            ACCOUNT NO.:  [id number]   MEDICAL RECORD NO.:  [id number]          PATIENT TYPE:  INP   LOCATION:  3301                         FACILITY:  [facility]   PHYSICIAN:  Di Kindle. Edilia Bo, M.D.DATE OF BIRTH:  Oct 12, 1950   DATE OF ADMISSION:  09/24/2007  DATE OF DISCHARGE:  09/25/2007                               DISCHARGE SUMMARY   BRIEF HISTORY:  This is a 60 year old gentleman, who was found to have  carotid bruits by Dr. Antoine Poche.  This prompted a duplex scan, which  showed a greater than 80% right carotid stenosis and 60-79% left carotid  stenosis.  Right carotid endarterectomy was recommended in order to  lower his risk of future stroke.  The remainder of his history and  physical is as dictated without addition or deletion.   HOSPITAL COURSE:  The patient was taken to the operating room on September 24, 2007, where he underwent a right carotid endarterectomy with Dacron  patch angioplasty.  He was found to have a tight 80-90% right carotid  stenosis.  He awoke neurologically intact.  He did well postoperatively.  He was monitored initially in the recovery room and then transferred to  3300.  There, he remained hemodynamically stable and neurologically  intact, and by postoperative day #1, he was ambulating, voiding, and  eating without difficulty.  He was ready for discharge.   DISCHARGE DIAGNOSIS:  Greater than 80% right carotid stenosis.   PROCEDURES:  Right carotid endarterectomy with Dacron patch angioplasty  on September 24, 2007.   SECONDARY DIAGNOSES:  1. Hypertension.  2. Hypercholesterolemia.  3. Coronary artery disease.   DISCHARGE MEDICATIONS:  1. Amlodipine 1 mg p.o. daily.  2. Ciprodex 1 p.o. daily.  3. Vytorin 1 p.o. daily.  4. Aspirin 325 mg p.o. daily.  5. Tylox 1-2 q.4 h. p.r.n. pain.   Condition on discharge is good.  Discharge is to home.  Followup is in 2  weeks.      Di Kindle. Edilia Bo, M.D.  Electronically  Signed     CSD/MEDQ  D:  10/13/2007  T:  10/14/2007  Job:  [id number]   cc:   Rollene Rotunda, MD, [facility]

## 2010-11-05 NOTE — Assessment & Plan Note (Signed)
[facility]                            CARDIOLOGY OFFICE NOTE   [name] [name], [name] [name]                   MRN:          [id number]  DATE:09/22/2007                            DOB:          03-07-1951    PRIMARY CARE PHYSICIAN:  Lindaann Pascal, PA; [facility]   REASON FOR PRESENTATION:  Preoperative evaluation of a patient with  peripheral vascular disease.   HISTORY OF PRESENT ILLNESS:  This is the  second office visit for this  60 year old gentleman.  I saw him in early March.  He was having some  chest discomfort.  It was somewhat atypical for angina.  However, he had  known nonobstructive disease.  I sent for a stress perfusion study.  This demonstrated a low-risk scan.  There were some decrease counts in  the inferior wall that was slightly reversible.  This may represent some  worsening disease in the PDA.  However, this was a questionable finding.  He had thickening of all regions of his left ventricular muscle.  He had  an overall well-preserved ejection fraction.  This was felt to be a low-  risk scan.   He did have follow-up of carotid bruits, and was found to have greater  than 80% right carotid stenosis and 60-79% left carotid stenosis; with  left subclavian stenosis and a probable steel.  I sent him to Dr.  Waverly Ferrari, who was planning to do a right carotid  endarterectomy on Friday, September 24, 2007.   The patient returns to discuss this.  He continues to get this  discomfort.  It happens in the morning after he drinks his coffee.  He  gets a burning and needs to burp.  It may happen at night after eating  as well.  However, he can be quite vigorously active pumping a crank to  lower his truck onto the hitch.  With this activity, he does not bring  on this chest discomfort.  He has had no palpitations, presyncope or  syncope.  He is not having any PND or orthopnea.  He continues to smoke  cigarettes.   PAST MEDICAL HISTORY:  1. Coronary artery disease:  (Catheterization 2007.  His left main was      normal.  The LAD had a first diagonal that had ostial 40% stenosis.      The circumflex was normal.  The right coronary artery was dominant.      There was 30% plaque in the proximal mid segment.  The PDA has 70-      75% focal proximal stenosis, followed by 50% stenosis.  He was      managed medically.).  2. Dyslipidemia.  3. Benign lung nodule biopsy.  4. Peripheral vascular disease as described.  5. Laparoscopic inguinal hernia repair.   ALLERGIES:  NONE.   MEDICATIONS:  1. Prilosec 20 mg daily.  2. Amlodipine 5 mg daily.  3. Vytorin 10/40 daily.   REVIEW OF SYSTEMS:  As stated in the HPI; otherwise negative for other  systems.   PHYSICAL EXAMINATION:  The patient is in no distress.  Blood pressure  124/74, heart rate 75 and regular, weight 160 pounds, body mass index  22.  HEENT:  Eyes unremarkable.  Pupils equal, round and reactive to light.  Fundi not visualized.  Oral mucosa normal.  NECK:  No jugular distention at 45 degrees.  Carotid upstroke brisk and  symmetric.  Left carotid bruit.  No thyromegaly.  LYMPHATICS:  No cervical, axillary or inguinal adenopathy.  LUNGS:  Clear to auscultation bilaterally.  BACK:  No costovertebral angle mass.  CHEST:  Unremarkable.  HEART:  PMI not displaced or sustained; S1 and S2 within normal limits.  No S3, no S4; no clicks, rubs, murmurs.  ABDOMEN:  Flat.  Positive bowel sounds; normal frequency and pitch.  No  bruits, rebound, guarding or midline pulsatile mass or organomegaly.  SKIN:  No rashes or nodules.  EXTREMITIES:  There was 2+ right radial, 1+ left radial, 2+ dorsalis  pedis and posterior tibialis bilaterally.  No cyanosis, clubbing and no  edema.  NEUROLOGIC:  Oriented to person, place and time.  Cranial nerves II-XII  grossly intact.  Motor grossly intact.   ASSESSMENT AND PLAN:  1. Preoperative Clearance.  I further  questioned the patient about his      chest pain.  It is quite atypical.  It has been a stable pattern.      This is not reproducible with exertion.  He had a low risk nuclear      study.  Therefore, given this and according to ACC/H guidelines the      patient is an acceptable risk for carotid endarterectomy without      further imaging.  I had a long discussion with the patient and his      wife about this recommendation.  2. Next coronary artery disease.  The patient did have nonobstructive      coronary disease, but again a low risk study as above.  He needs      aggressive risk reduction.  We had a long, very frank discussion      about this.  He understands that his risk of future cardiovascular      events, if he does not make some serious lifestyle changes, is very      high.  He should start on an aspirin.  3. Hypertension.  His blood pressure will always be higher in the      right arm than the left, given the subclavian stenosis.  He needs      to have his blood pressure checked in that arm and treated      according to those readings.  We discussed this.  4. Dyslipidemia.  He was just started on Vytorin.  His LDL was 177.  I      think his goal should be an LDL less than 70, and an HDL greater      than 40.  I will defer to [facility], who is following this.  5. Tobacco.  We again had a greater than 3 minute discussion about the      need to stop smoking.  He may be conceding a little bit in his      desire to quit smoking.  He would like to have a nicotine patch      during his hospitalization, and continue this thereafter.  He has      tried Chantix and failed.   FOLLOWUP:  He should come back here at least yearly, or sooner if he has  any change in his symptoms.Rollene Rotunda, MD, [facility]  Electronically Signed    JH/MedQ  DD: 09/22/2007  DT: 09/22/2007  Job #: [id number]   cc:   Di Kindle. Edilia Bo, M.D.

## 2010-11-05 NOTE — H&P (Signed)
NAME:  [name] [name], [name] [name]            ACCOUNT NO.:  [id number]   MEDICAL RECORD NO.:  [id number]          PATIENT TYPE:  AMB   LOCATION:  DAY                           FACILITY:  [facility]   PHYSICIAN:  Dennie Maizes, M.D.   DATE OF BIRTH:  09-Jul-1950   DATE OF ADMISSION:  06/07/2008  DATE OF DISCHARGE:  LH                              HISTORY & PHYSICAL   CHIEF COMPLAINT:  Intermittent severe left flank pain, left upper  ureteral calculus with obstruction.   HISTORY OF PRESENT ILLNESS:  This 60 year old male is referred to me  from [facility].  He has been having severe  intermittent left flank pain radiating to the front for about 10 days.  The pain intensity is 10/10.  He has not passed a stone yet.  His pain  is adequately relieved with pain medications at present.  He denies  fevers, chills, rigors, voiding difficulty or gross hematuria.  No past  history of urolithiasis.  He does not have any significant voiding  difficulty at present.   PAST MEDICAL HISTORY:  1. Status post right carotid endarterectomy.  2. Multiple bilateral inguinal hernia repairs.  3. History of cardiac catheterization November 2009.  The patient has      been started on medications.   MEDICATIONS:  1. Hydrocodone.  2. Isosorbide.  3. Vytorin.  4. Amlodipine.  5. Cipro.   ALLERGIES:  None.   PHYSICAL EXAMINATION:  VITAL SIGNS:  Height 5 feet 11 inches, weight 157  pounds.  HEENT:  Normal.  LUNGS:  Clear to auscultation.  HEART:  Regular rate and rhythm.  No murmurs.  ABDOMEN:  Soft.  No palpable flank mass or CVA tenderness.  Bladder is  not palpable.  GENITALIA:  Penis and testes are normal.  RECTAL:  Not done.   Evaluation was done with a CT scan of the abdomen and pelvis without  contrast, as well as x-ray of the KUB area.  This revealed an 8 mm size  stone in the left upper ureter with mild obstruction.  The stone is  located at the level of the L4 transverse process.   The patient has a  small right nonobstructing stone.   IMPRESSION:  1. Left upper ureteral calculus with obstruction.  2. Left renal colic.  3. Nonobstructing small right renal calculus.   PLAN:  I have discussed with the patient regarding management options.  He is scheduled to undergo lithotripsy of the stone at [facility] on June 07, 2008.  I explained to the patient regarding the  diagnosis, operative details, alternate treatments, outcome, possible  risks and complications, and he has agreed for the procedure to be done.  Additional procedures like stent placement may be necessary in view of  the large stone.      Dennie Maizes, M.D.  Electronically Signed     SK/MEDQ  D:  06/07/2008  T:  06/07/2008  Job:  [id number]   cc:   [facility]

## 2010-11-05 NOTE — Assessment & Plan Note (Signed)
[facility]                            CARDIOLOGY OFFICE NOTE   [name] [name], [name] [name]                   MRN:          [id number]  DATE:03/27/2008                            DOB:          1951-04-17    PRIMARY CARE PHYSICIAN:  Dr. Lorin Picket Long   REASON FOR PRESENTATION:  Evaluate the patient with chest pain.   HISTORY OF PRESENT ILLNESS:  The patient presents for followup of chest  discomfort and referral from Mr. Jacqulyn Bath to investigate new symptoms.   The patient is a 60 year old gentleman with a history of coronary  disease as described below.  He presented for preoperative evaluation  most recently in April prior to having a carotid endarterectomy.  He had  a stress perfusion study which demonstrated mildly decreased inferior  wall counts.  This was possible ischemia versus attenuation.  However,  it was felt to be low risk and so I approved him for surgery.  He had  this without any cardiovascular complications.   He now is referred back by Mr. Jacqulyn Bath because he is having increasing  chest pain.  This is happening now daily.  It may happen at rest.  It is  brought on with exertion at times.  It is a mid chest discomfort.  It  may radiate to both arms.  It goes up to his jaw.  He does get sweaty on  occasion.  It may be 9/10 in intensity.  He takes Gas-X.  He does have  burping.  It last 3-4 minutes.  It is similar to previous pain, but more  intense and more frequent.  He does get it at times with spicy food  which he avoids.  He denies any new shortness of breath.  He has had no  PND or orthopnea.  He does not have palpitation, presyncope or syncope.  Unfortunately, he continues to smoke cigarettes.   PAST MEDICAL HISTORY:  Coronary artery disease (Catheterization in 2007.  His left main was normal.  The LAD had a first diagonal with an ostial  of 40% stenosis.  The circumflex was normal.  The right coronary artery  was dominant.  There was 30%  plaque in the proximal and mid segment.  The PDA had 70-75% focal proximal stenosis.  This was followed by 50%  stenosis.  He was managed medically), dyslipidemia, benign lung nodule,  ongoing tobacco abuse, carotid stenosis (greater than 80% right carotid  stenosis and 60-70 9% left carotid stenosis with left subclavian  stenosis and a probable steel.   PAST SURGICAL HISTORY:  Laparoscopic inguinal hernia repair bilaterally.  Right carotid endarterectomy.   ALLERGIES:  None.   MEDICATIONS:  1. Prilosec 10 mg b.i.d.  2. Amlodipine 5 mg daily.  3. Vytorin 10/40 nightly.  4. Aspirin 81 mg daily.  5. Multivitamin.   SOCIAL HISTORY:  The patient is married since 2005.  He smokes now one-  pack per day.  He is a Marine scientist.   FAMILY HISTORY:  Noncontributory for early coronary artery disease.   REVIEW OF SYSTEMS:  As stated in the HPI and otherwise  negative for all  other systems.   PHYSICAL EXAMINATION:  GENERAL:  The patient is in no distress.  VITAL SIGNS:  Blood pressure 129/84, heart rate 82 and regular, weight  158 pounds, body mass index 21.  HEENT:  Eyes unremarkable.  Pupils equal, round and reactive to light.  Fundi not visualized.  Oral mucosa unremarkable, edentulous.  NECK:  Well-healed right carotid endarterectomy scar.  No jugular venous  distention.  Waveform within normal limits.  Carotid upstroke brisk and  symmetrical.  Left carotid bruit, no thyromegaly.  LYMPHATICS:  No cervical, axillary, or inguinal adenopathy.  LUNGS:  Clear to auscultation bilaterally.  BACK:  No costovertebral angle tenderness.  CHEST:  Unremarkable.  HEART:  PMI not displaced or sustained.  S1 and S2 within normal.  No  S3, no S4.  No clicks, no rubs, no murmur.  ABDOMEN:  Flat, positive bowel sounds normal in frequency and pitch.  No  bruits, no rebound, no guarding.  No midline pulsatile mass.  No  hepatomegaly, no splenomegaly.  SKIN:  No rashes, no nodules.   EXTREMITIES:  2+ pulse throughout.  No edema, no cyanosis, no clubbing.  NEUROLOGICAL:  Oriented to person, place, and time.  Cranial nerves II  through XII grossly intact.  Motor grossly intact.   EKG, sinus rhythm, rate 82, axis within normal limits, intervals within  normal limits, no acute ST-T wave changes.   ASSESSMENT AND PLAN:  1. Chest.  The patient's chest discomfort is worrisome for unstable      angina.  He did have a negative stress perfusion study in April.      However, his symptoms do have some features that sound like angina      and we cannot exclude this.  I think safely based on the previous      perfusion study, he does have multiple risk factors and known      peripheral vascular disease.  He has had nonobstructive disease in      the past.  Therefore, with this high pretest probability of      obstructive disease, the next step is cardiac catheterization.  I      explained this to him time in detail.  He understands all the risks      and benefits of cardiac catheterization.  He and his wife agreed to      proceed.  2. Tobacco.  He says he cannot give up smoking.  We have talked about      this again.  3. Risk reduction.  He had his lipids [facility] at [facility].      The goal of LDL is less than 100 and HDL greater than 40.  4. Peripheral vascular disease.  He is followed by Dr. Edilia Bo.  5. Followup will be at the time of his cardiac catheterization.     Rollene Rotunda, MD, [facility]  Electronically Signed    JH/MedQ  DD: 03/27/2008  DT: 03/27/2008  Job #: [id number]   cc:   Dr. Lindaann Pascal

## 2010-11-08 NOTE — H&P (Signed)
NAME:  [name] [name], [name] [name]            ACCOUNT NO.:  [id number]   MEDICAL RECORD NO.:  [id number]          PATIENT TYPE:   LOCATION:                                 FACILITY:   PHYSICIAN:  Dalia Heading, M.D.  DATE OF BIRTH:  [birth year]   DATE OF ADMISSION:  DATE OF DISCHARGE:  LH                                HISTORY & PHYSICAL   CHIEF COMPLAINT:  Left groin pain.   HISTORY OF PRESENT ILLNESS:  The patient is a 60 year old white male status  post a left inguinal herniorrhaphy in 2004 who presents with intermittent  left groin pain.  He does have a history of nonspecific musculoskeletal  pain.  He states he continues to have intermittent burning in the left groin  region where his surgery was.  Thus, there is a question of a neuroma  present in this region.   PAST MEDICAL HISTORY:  As noted above.   PAST SURGICAL HISTORY:  Throat surgery, left inguinal herniorrhaphy in 2004.   CURRENT MEDICATIONS:  Prilosec, baby aspirin, Flexeril p.r.n.   ALLERGIES:  No known drug allergies.   REVIEW OF SYSTEMS:  The patient smokes a pack cigarettes a day.  Denies any  significant alcohol use.  He denies any other cardiopulmonary difficulties  or bleeding disorders.   PHYSICAL EXAMINATION:  GENERAL:  On physical examination, the patient is a  well-developed, well-nourished white male in no acute distress.  LUNGS:  Clear to auscultation with equal breath sounds bilaterally.  HEART:  Heart examination reveals regular rate and rhythm without S3, S4 or  murmurs.  ABDOMEN:  The abdomen is soft, nontender, nondistended.  No  hepatosplenomegaly or masses noted.  A well-healed left inguinal scar is  noted.  There is mild point tenderness in the subcutaneous tissue around the  internal ring.  This seems to radiate to the left testicle and scrotum.   IMPRESSION:  Neuroma, left inguinal.   PLAN:  The patient is scheduled for excision of neuroma, left inguinal,  October 01, 2005.  Risks and benefits  of the procedure including bleeding,  pain, recurrence of the symptoms and numbness in the skin were fully  explained to the patient, who gave informed consent.      Dalia Heading, M.D.  Electronically Signed     MAJ/MEDQ  D:  09/18/2005  T:  09/18/2005  Job:  [id number]

## 2010-11-08 NOTE — H&P (Signed)
NAME:  [name] [name]            ACCOUNT NO.:  [id number]   MEDICAL RECORD NO.:  [id number]          PATIENT TYPE:  AMB   LOCATION:  DAY                           FACILITY:  [facility]   PHYSICIAN:  Dalia Heading, M.D.  DATE OF BIRTH:  07-14-1950   DATE OF ADMISSION:  DATE OF DISCHARGE:  LH                              HISTORY & PHYSICAL   CHIEF COMPLAINT:  Recurrent left groin pain/hernia.   HISTORY OF PRESENT ILLNESS:  The patient is a 60 year old white male  status post a left inguinal herniorrhaphy in 2004 with a left groin  exploration and excision of a neuroma in 2007 who presents with ongoing  left groin pain.  He states the pain radiates to his left pubic  tubercle.  It does radiate down to the left testicle.  Small hernia is  noted when straining.  He also complains of dysuria and pain with  erection which I told him is not related to his left inguinal pain.   PAST MEDICAL HISTORY:  As noted above, throat surgery.   CURRENT MEDICATIONS:  None.   ALLERGIES:  No known drug allergies.   REVIEW OF SYSTEMS:  The patient smokes a pack cigarettes a day.  Denies  any alcohol use.  Denies any other cardiopulmonary difficulties or  bleeding disorders.   PHYSICAL EXAMINATION:  The patient is a well-developed, well-nourished  white male in no acute distress.  Lungs are clear to auscultation with  equal breath sounds bilaterally.  Heart examination reveals a regular  rate and rhythm without S3, S4 or murmurs.  The abdomen is soft and  nondistended.  He is tender over the left internal ring.  Mild swelling  is noted.  No hepatosplenomegaly or masses are noted.  Genitourinary  examination is within normal limits.   IMPRESSION:  Recurrent left groin pain/hernia, question abnormal  reaction to the previously placed plug/patch mesh.   PLAN:  The patient is scheduled for a recurrent left inguinal  herniorrhaphy with removal of the mesh on July 22, 2006.  The risks  and benefits of  the procedure including bleeding, infection, ongoing  pain and the possibly recurrence of the hernia were fully explained to  the patient, gave informed consent.      Dalia Heading, M.D.  Electronically Signed     MAJ/MEDQ  D:  07/21/2006  T:  07/21/2006  Job:  [id number]   cc:   [facility] Short Stay   Colon Flattery, D.O.  He has moved to [place].  This information was given by  his former office.

## 2010-11-08 NOTE — H&P (Signed)
   NAME:  [name] [name], [name] [name] NO.:  [id number]   MEDICAL RECORD NO.:  [id number]                  PATIENT TYPE:   LOCATION:                                       FACILITY:   PHYSICIAN:  Dalia Heading, M.D.               DATE OF BIRTH:  05-25-1951   DATE OF ADMISSION:  01/13/2003  DATE OF DISCHARGE:                                HISTORY & PHYSICAL   CHIEF COMPLAINT:  Left inguinal hernia.   HISTORY OF PRESENT ILLNESS:  Patient is a 60 year old white male who was  referred for evaluation and treatment of a left inguinal hernia.  He noticed  a lump in the left groin region three weeks ago.  The pain radiates to the  left testicle.  It is made worse with straining.  The symptoms seem to be  worsening.   PAST MEDICAL HISTORY:  1. Includes musculoskeletal pain.  2. Indigestion.   PAST SURGICAL HISTORY:  Throat surgery.   CURRENT MEDICATIONS:  Celebrex, Prilosec, baby aspirin.   ALLERGIES:  No known drug allergies.   REVIEW OF SYSTEMS:  The patient does smoke a pack of cigarettes a day.  Denies any significant alcohol use.  Denies any recent cardiopulmonary  difficulties or bleeding disorders.   PHYSICAL EXAMINATION:  GENERAL:  The patient is a well-developed, well-  nourished, white male in no acute distress.  VITAL SIGNS:  He is afebrile and vital signs are stable.  LUNGS:  Clear to auscultation with equal breath sounds bilaterally.  HEART:  Reveals a regular, rate and rhythm without S3, S4 or murmurs.  ABDOMEN:  Soft, nontender, nondistended.  No hepatosplenomegaly or masses  are noted.  Left inguinal is noted which is easily reducible.  GENITOURINARY:  Within normal limits.   IMPRESSION:  Left inguinal hernia.    PLAN:  The patient was scheduled for left inguinal herniorrhaphy on January 13, 2003.  The risks and benefits of the procedure including bleeding,  infection, pain, the possibility of recurrence of the hernia were fully  explained to  the patient.  He gave informed consent.                                                Dalia Heading, M.D.    MAJ/MEDQ  D:  01/10/2003  T:  01/10/2003  Job:  [id number]   cc:   Colon Flattery, MD  [address]  [place]  [place] [address]  Fax: [phone]

## 2010-11-08 NOTE — Op Note (Signed)
NAME:  [name] [name], [name] [name]                      ACCOUNT NO.:  [id number]   MEDICAL RECORD NO.:  [id number]                   PATIENT TYPE:  AMB   LOCATION:  DAY                                  FACILITY:  [facility]   PHYSICIAN:  Dalia Heading, M.D.               DATE OF BIRTH:  [birth year]   DATE OF PROCEDURE:  01/13/2003  DATE OF DISCHARGE:                                 OPERATIVE REPORT   PREOPERATIVE DIAGNOSIS:  Left inguinal hernia.   POSTOPERATIVE DIAGNOSIS:  Left inguinal hernia.   PROCEDURE:  Left inguinal herniorrhaphy.   SURGEON:  Dalia Heading, M.D.   ANESTHESIA:  General endotracheal   INDICATIONS:  The patient is a 60 year old white male who presents with  symptomatic left inguinal hernia.  The risks and benefits of the procedure  including bleeding, infection, pain, and the possibility of recurrence of  the hernia were fully explained to the patient, who gave informed consent.   DESCRIPTION OF PROCEDURE:  The patient was placed in the supine position.  The left groin region was prepped and draped using the usual sterile  technique with Betadine.  Surgical site confirmation was performed.   A transverse incision was made in the left groin region down to the external  oblique aponeurosis.  The aponeurosis was incised to the external ring.  A  Penrose drain was placed around the spermatic cord.  The  ilioinguinal nerve  was identified and retracted superiorly from the operative field.  The  patient had a direct hernia.  This was incised along its base and inversed.  A large sized plug was then placed in this region and secured  circumferentially to the transversalis fascia using a 2-0 Novofil  interrupted suture.   On Onlay Marlex mesh patch was then placed along the floor of the inguinal  canal and secured superiorly to the conjoined tendon and inferiorly to the  shelving edge of Poupart's ligament using a 2-0 Novofil interrupted suture.  The internal ring was  recreated using a 2-0 Novofil interrupted suture.  The  external oblique aponeurosis was reapproximated using a 2-0 Vicryl running  suture.  The subcutaneous layer was reapproximated using a 3-0 Vicryl  interrupted suture.  The skin was closed using a 4-0 Vicryl subcuticular  suture  Sensorcaine 0.5% was instilled into the surrounding wound and the  wound was covered with Collodion.   All tape and needle counts were correct at the end of the procedure.  The  patient was extubated in the operating room and went back to recovery room  in awake and stable condition.   COMPLICATIONS:  None.    SPECIMEN:  None.   BLOOD LOSS:  Minimal.  Dalia Heading, M.D.    MAJ/MEDQ  D:  01/13/2003  T:  01/13/2003  Job:  [id number]   cc:   Colon Flattery, MD  [address]  [place]  [place] [address]  Fax: [phone]

## 2010-11-08 NOTE — Op Note (Signed)
NAME:  [name] [name], [name] [name]            ACCOUNT NO.:  [id number]   MEDICAL RECORD NO.:  [id number]          PATIENT TYPE:  AMB   LOCATION:  DAY                           FACILITY:  [facility]   PHYSICIAN:  Dalia Heading, M.D.  DATE OF BIRTH:  06-28-1950   DATE OF PROCEDURE:  07/22/2006  DATE OF DISCHARGE:                               OPERATIVE REPORT   PREOPERATIVE DIAGNOSIS:  Recurrent left inguinal hernia, pain.   POSTOPERATIVE DIAGNOSIS:  Recurrent left inguinal hernia, pain.   PROCEDURE:  Recurrent left inguinal herniorrhaphy.   SURGEON:  Dalia Heading, MD   ANESTHESIA:  General endotracheal.   INDICATIONS:  The patient is a 60 year old white male status post left  inguinal herniorrhaphy with a plug and patch mesh placement in the past,  who now presents with recurrent left inguinal pain and swelling.  The  patient now comes to the operating room for removal of the previously-  placed mesh and repair of the left inguinal hernia.  The risks and  benefits of the procedure were fully explained to the patient, who gave  informed consent.   PROCEDURE NOTE:  The patient was placed in the supine position.  After  induction of general endotracheal anesthesia, the left groin region was  prepped and draped using the usual sterile technique with Betadine.  Surgical site confirmation was performed.   An incision was made through the previous surgical scar, which was  excised.  This was taken down to the external oblique aponeurosis.  The  aponeurosis was incised to the external ring.  The ilioinguinal nerve  was identified.  A high ligation of this was performed and a section of  the nerve was sent to pathology for further examination.  The  polypropylene mesh plug that was placed for repair of a direct hernia in  the past was removed without difficulty.  The onlay polypropylene mesh  was also removed using sharp dissection without difficulty.  A portion  the transversalis fascia with  conjoined tendon was then secured to the  superior aspect of the shelving edge of Poupart's ligament using a  running 2-0 Novofil suture.  The external oblique aponeurosis was also  approximated using a 2-0 Novofil running suture.  The subcutaneous layer  was reapproximated using 3-0 Vicryl interrupted sutures.  The skin was  closed using a 4-0 Vicryl subcuticular suture.  Sensorcaine 0.5% was  instilled into the surrounding wound.  Of note was the fact that a  steroid injection was made into the left pubic tubercle and surrounding  scar tissue.  Dermabond was then applied to the wound.   All tape and needle counts were correct at the end of the procedure.  The patient was extubated in the operating room and went back to the  recovery room awake, in stable condition.   COMPLICATIONS:  None.   SPECIMEN:  Ilioinguinal nerve.   BLOOD LOSS:  Minimal.      Dalia Heading, M.D.  Electronically Signed     MAJ/MEDQ  D:  07/22/2006  T:  07/22/2006  Job:  [id number]

## 2010-11-08 NOTE — Cardiovascular Report (Signed)
[place]. [facility]  Patient:    [name] [name], [name] [name]                     MRN: [id number] Proc. Date: 08/11/00 Adm. Date:  [id number] Disc. Date: [id number] Attending:  Ronaldo Miyamoto CC:         Cardiac Catheterization Laboratory  Luis Abed, M.D. [facility] - & Colon Flattery, M.D. - [place], [facility], [place], [place]   Cardiac Catheterization  PROCEDURE: 1. Left heart catheterization. 2. Selective coronary arteriography. 3. Selective left ventriculography. 4. Intercoronary nitroglycerin administration.  CARDIOLOGIST:  Arturo Morton. Riley Kill, M.D.  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  A Cardiolyte scan was done, which revealed no evidence of perfusion deficit.  As a result, he continued to have symptoms and was seen by Roxanne Mins, P.A.-C. in the [facility] in [place]. A cardiac catheterization was then recommended.  DESCRIPTION OF PROCEDURE:  The procedure was performed from the right femoral artery using 6-French catheters.  He tolerated the procedure without complication.  We did use a JL5 diagnostic catheter to engage the left main. Intercoronary nitroglycerin was also given.  Following this, repeat views of the right coronary artery were obtained.  The patient experienced no complications.  HEMODYNAMIC DATA: Central aortic pressure:  121/69. LV pressure:  120/15.  There was no gradient on pullback across the aortic valve.  ANGIOGRAPHIC DATA: LEFT VENTRICULOGRAPHY:  The left ventriculography was performed in the RAO projection.  The ejection fraction was calculated at 55.9%.  No definite segmental wall motion abnormalities were seen.  RESULTS: 1. Left main coronary artery:  The left main coronary artery was free of    critical disease. 2. Left anterior descending coronary artery:  The left anterior descending    artery coursed to the apex.  There were two major  diagonal branches.    The first diagonal branch had some ostial tapering that measured about    40% luminal reduction, but did not appear to be flow-limiting.  The    distal LAD wrapped the apex. 3. Circumflex coronary artery:  The circumflex artery provided two marginal    branches and was free of critical disease. 4. Right coronary artery:  The right coronary artery demonstrates about    30% segmental plaquing in the junction of the proximal and midvessel.    Beyond this there is another area of mild luminal irregularity of about    20% in the distal portion of the midvessel.  The proximal portion of    the posterior descending artery just beyond the ostium has about a    70%-75% area of focal stenosis.  Beyond this area there is about 50%    narrowing, and this whole ostial segment is somewhat segmentally diseased.    It is a moderate-sized posterior descending branch.  The large    posterolateral branch is without significant focal narrowing.  CONCLUSIONS: 1. Preserved overall left ventricular function. 2. A 70%-75% ostial right coronary artery stenosis. 3. Scattered lesions as noted above. 4. An 8.0 mm lung nodule.  PLAN: 1. Review the films with Dr. Luis Abed. 2. Recommend that the patient stop smoking. 3. Aspirin and nitrates. 4. Follow up in the [facility]. 5. Limited CT scan to evaluate the lung nodule.DD:  08/11/00 TD:  08/12/00 Job: [id number]

## 2010-11-08 NOTE — Op Note (Signed)
NAME:  [name] [name], [name] [name]            ACCOUNT NO.:  [id number]   MEDICAL RECORD NO.:  [id number]          PATIENT TYPE:  AMB   LOCATION:  DAY                           FACILITY:  [facility]   PHYSICIAN:  Dalia Heading, M.D.  DATE OF BIRTH:  17-Oct-1950   DATE OF PROCEDURE:  DATE OF DISCHARGE:                                 OPERATIVE REPORT   PREOPERATIVE DIAGNOSIS:  Left groin pain.   POSTOPERATIVE DIAGNOSIS:  Left groin pain.   PROCEDURE:  Excision of neuroma, left inguinal.   SURGEON:  Dr. Franky Macho.   ANESTHESIA:  General endotracheal.   INDICATIONS:  The patient is a 60 year old white male status post left  inguinal herniorrhaphy in 2004 who presents with intermittent left groin.Marland Kitchen  He does have a history of nonspecific musculoskeletal pain.  The patient  probably has a neuroma in this region, now comes to the operating for  exploration of left inguinal region.  Risks and benefits of the procedure  including bleeding, infection, recurrence of the symptoms, and numbness of  the skin were fully explained to the patient, who gave informed consent.   PROCEDURE NOTE:  The patient was placed in supine position.  After induction  of general endotracheal anesthesia, the left groin region was prepped and  draped in the usual sterile technique with Betadine.  Surgical site  confirmation was performed.   A left groin incision was made through the previous surgical scar.  This was  taken down to the external oblique aponeurosis.  The aponeurosis was incised  to the external ring.  Excess scar tissue was excised from Poupart's  ligament down to the pubic tubercle.  Any sutures in this region were  removed without difficulty.  Kenalog was then injected over the pubic  tubercle as well as 0.5% Sensorcaine.  Care was taken to avoid the vas  deferens and testicular vessels.  Excess scar tissue was also excised over  the conjoined tendon.  Several sutures of 2-0 Novofil were removed.  No  hernia was noted be present.  The previous repair was noted be intact.  There was some scar tissue around the lateral aspect of the internal ring  and this was excised without difficulty.  It is suspected that this  contained a neuroma.  Any bleeding was controlled using Bovie  electrocautery.  The good incorporation of the previous polypropylene mesh  was noted.  The external oblique aponeurosis was reapproximated using a 2-0  Vicryl running suture.  The subcutaneous layer was reapproximated using a 3-  0 Vicryl interrupted suture.  Skin was closed using a 4-0 Vicryl  subcuticular suture.  0.5 cm Sensorcaine was instilled in the surrounding  wound.  Dermabond was then applied.   All tape and needle counts correct at the end of the procedure.  The patient  was extubated in the operating room went back to recovery room awake in  stable condition.   COMPLICATIONS:  None.   SPECIMEN:  None.   BLOOD LOSS:  Minimal.      Dalia Heading, M.D.  Electronically Signed     MAJ/MEDQ  D:  10/01/2005  T:  10/01/2005  Job:  [id number]

## 2010-11-08 NOTE — Op Note (Signed)
NAME:  [name], [name]            ACCOUNT NO.:  [id number]   MEDICAL RECORD NO.:  [id number]          PATIENT TYPE:  AMB   LOCATION:  CARD                         [facility]AC[facility]   PHYSICIAN:  Shan Levans, M.D. LHCDATE OF BIRTH:  23-May-1951   DATE OF PROCEDURE:  04/26/2004  DATE OF DISCHARGE:                                 OPERATIVE REPORT   PROBLEMS:  Bronchoscopy.   INDICATIONS:  Right hilar adenopathy.  Evaluate for cause.   OPERATOR:  Shan Levans, M.D.   ANESTHESIA:  Xylocaine local 1%.   PREOP MEDICATIONS:  Versed 3 mg IV push, Demerol 30 mg IV push.   PROCEDURE:  The Pentax video bronchoscope is introduced into the oropharynx.  The upper airways are visualized.  Were unremarkable.  The entire  tracheobronchial tree was visualized and revealed diffuse tracheobronchitis  with intense airway inflammation seen and some scattered, thick mucus seen.  Transbronchial biopsies were obtained from the right middle lobe and right  lower lobe.  Wang fine needle aspiration was obtained between the air flow  divider between the right upper lobe and right bronchus intermedius.  Bronchial washings were obtained.  Complications were none.   IMPRESSION:  Right hilar fullness, rule out malignancy versus react to  change.   RECOMMENDATIONS:  Follow up microbiology and pathology.     Patr   PW/MEDQ  D:  04/26/2004  T:  04/26/2004  Job:  [id number]

## 2011-03-14 ENCOUNTER — Other Ambulatory Visit: Payer: Self-pay | Admitting: Cardiology

## 2011-03-18 LAB — CBC
HCT: 49.6
Hemoglobin: 13.1
Hemoglobin: 17
MCHC: 34.3
MCV: 89.3
RBC: 4.27
RBC: 5.57
RDW: 13
WBC: 10.7 — ABNORMAL HIGH
WBC: 13.2 — ABNORMAL HIGH

## 2011-03-18 LAB — URINALYSIS, ROUTINE W REFLEX MICROSCOPIC
Bilirubin Urine: NEGATIVE
Glucose, UA: NEGATIVE
Protein, ur: NEGATIVE
Urobilinogen, UA: 0.2

## 2011-03-18 LAB — COMPREHENSIVE METABOLIC PANEL
ALT: 18
Alkaline Phosphatase: 113
BUN: 16
CO2: 29
Chloride: 102
GFR calc non Af Amer: >60
Glucose, Bld: 101 — ABNORMAL HIGH
Potassium: 3.8
Sodium: 140
Total Bilirubin: 0.8

## 2011-03-18 LAB — BASIC METABOLIC PANEL
CO2: 25
Calcium: 8.4
Chloride: 107
Creatinine, Ser: 1
GFR calc Af Amer: >60
Sodium: 137

## 2011-03-18 LAB — PROTIME-INR
INR: 0.9
Prothrombin Time: 12

## 2011-03-18 LAB — ABO/RH: ABO/RH(D): B POS

## 2011-03-18 LAB — URINE MICROSCOPIC-ADD ON

## 2011-03-18 LAB — TYPE AND SCREEN

## 2011-03-28 ENCOUNTER — Encounter: Payer: Self-pay | Admitting: Cardiology

## 2011-03-28 LAB — BASIC METABOLIC PANEL
CO2: 25 mEq/L (ref 19–32)
Chloride: 106 mEq/L (ref 96–112)
GFR calc Af Amer: >60 mL/min (ref >60)
Glucose, Bld: 114 mg/dL — ABNORMAL HIGH (ref 70–99)
Sodium: 139 mEq/L (ref 135–145)

## 2011-03-31 ENCOUNTER — Ambulatory Visit (INDEPENDENT_AMBULATORY_CARE_PROVIDER_SITE_OTHER): Payer: Self-pay | Admitting: Cardiology

## 2011-03-31 ENCOUNTER — Encounter: Payer: Self-pay | Admitting: Cardiology

## 2011-03-31 DIAGNOSIS — E785 Hyperlipidemia, unspecified: Secondary | ICD-10-CM

## 2011-03-31 DIAGNOSIS — I6529 Occlusion and stenosis of unspecified carotid artery: Secondary | ICD-10-CM

## 2011-03-31 DIAGNOSIS — I2510 Atherosclerotic heart disease of native coronary artery without angina pectoris: Secondary | ICD-10-CM

## 2011-03-31 DIAGNOSIS — F17200 Nicotine dependence, unspecified, uncomplicated: Secondary | ICD-10-CM

## 2011-03-31 NOTE — Patient Instructions (Signed)
The current medical regimen is effective;  continue present plan and medications.  Follow up in 1 year with Dr Hochrein.  You will receive a letter in the mail 2 months before you are due.  Please call us when you receive this letter to schedule your follow up appointment.  

## 2011-03-31 NOTE — Assessment & Plan Note (Signed)
He has not tolerated two of the statins.  He has a follow up with Paulita Cradle and I encourage him to work with her.  I suspect that he would at least tolerate Pravastatin.

## 2011-03-31 NOTE — Assessment & Plan Note (Signed)
He is due to have carotid doppler again in Dec.  He has bilateral 40 - 59% stenosis.

## 2011-03-31 NOTE — Progress Notes (Signed)
HPI The patient presents for follow up of CAD.  Since I last saw him he has done well.  The patient denies any new symptoms such as chest discomfort, neck or arm discomfort. There has been no new shortness of breath, PND or orthopnea. There have been no reported palpitations, presyncope or syncope.  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  Unfortunately, he still smokes cigarettes.  Allergies  Allergen Reactions  . Crestor (Rosuvastatin Calcium)     intolerance  . Lipitor (Atorvastatin Calcium)     Body cramps    Current Outpatient Prescriptions  Medication Sig Dispense Refill  . aspirin 81 MG tablet Take 81 mg by mouth daily.        . calcium citrate-vitamin D 200-200 MG-UNIT TABS Take 1 tablet by mouth daily.        . Cholecalciferol (VITAMIN D-3 PO) Take by mouth.        . Flaxseed, Linseed, (FLAX SEED OIL) 1000 MG CAPS Take 1 capsule by mouth daily.        . isosorbide mononitrate (IMDUR) 30 MG 24 hr tablet Take 30 mg by mouth daily.        . Multiple Vitamin (MULTIVITAMIN) tablet Take 1 tablet by mouth daily.        . naproxen sodium (ANAPROX) 220 MG tablet Take 220 mg by mouth 2 (two) times daily with a meal.        . omeprazole (PRILOSEC) 20 MG capsule Take 20 mg by mouth daily.        . Probiotic Product (PRO-FLORA CONCENTRATE) CAPS Take by mouth.        . sildenafil (VIAGRA) 50 MG tablet Take 50 mg by mouth daily as needed.        . TOPROL XL 25 MG 24 hr tablet TAKE 1 TABLET DAILY  30 each  3  . vitamin E (VITAMIN E) 400 UNIT capsule Take 400 Units by mouth daily.          Past Medical History  Diagnosis Date  . Coronary artery disease     left main had luminal irregularities, the LAD had 30% stenosis, the first diagonal had moderate-sized with ostial 35% stenosis, the circumflex in the AV groove had ostial 25% stenosis, the first obtuse marginal had long proximal 30% stenosis, the mid obtuse marginal was normal, the right coronary  artery was dominant and occluded proximally.  There was excellent left to right collateral filling.   Marland Kitchen CAD (coronary artery disease)     The EF was 55% with inferior hypokinesis    . Carotid stenosis   . Dyslipidemia   . Lung nodule     benign  . Tobacco abuse     Past Surgical History  Procedure Date  . Throat surgery   . Left inguinal herniorrhaphy     ROS:  As stated in the HPI and negative for all other systems.  PHYSICAL EXAM BP 120/64  Pulse 70  Ht 5\' 11"  (1.803 m)  Wt 160 lb (72.576 kg)  BMI 22.32 kg/m2 GENERAL:  Well appearing HEENT:  Pupils equal round and reactive, fundi not visualized, oral mucosa unremarkable, dentures NECK:  No jugular venous distention, waveform within normal limits, carotid upstroke brisk and symmetric, left bruit, right CEA scar, no thyromegaly LYMPHATICS:  No cervical, inguinal adenopathy LUNGS:  Clear to auscultation bilaterally BACK:  No CVA tenderness CHEST:  Unremarkable HEART:  PMI not displaced  or sustained,S1 and S2 within normal limits, no S3, no S4, no clicks, no rubs, no murmurs ABD:  Flat, positive bowel sounds normal in frequency in pitch, no bruits, no rebound, no guarding, no midline pulsatile mass, no hepatomegaly, no splenomegaly EXT:  2 plus pulses throughout, no edema, no cyanosis no clubbing SKIN:  No rashes no nodules NEURO:  Cranial nerves II through XII grossly intact, motor grossly intact throughout PSYCH:  Cognitively intact, oriented to person place and time   EKG:  Sinus rhythm, rate 70, axis within normal limits, intervals within normal limits, no acute ST-T wave changes.   ASSESSMENT AND PLAN

## 2011-03-31 NOTE — Assessment & Plan Note (Signed)
We discussed a specific strategy for tobacco cessation.  (Greater than three minutes discussing tobacco cessation.)  I gave him a telephone number to call.  He wants to discuss the electronic cigarette.

## 2011-03-31 NOTE — Assessment & Plan Note (Signed)
The patient has no new sypmtoms.  No further cardiovascular testing is indicated.  We will continue with aggressive risk reduction and meds as listed.  He does know that he is at much higher risk for acute event if he does not take a statin or stop smoking.

## 2011-04-01 LAB — HEMOGLOBIN AND HEMATOCRIT, BLOOD
HCT: 47.3
Hemoglobin: 16.3

## 2011-06-23 ENCOUNTER — Encounter (INDEPENDENT_AMBULATORY_CARE_PROVIDER_SITE_OTHER): Payer: BC Managed Care – PPO | Admitting: *Deleted

## 2011-06-23 ENCOUNTER — Other Ambulatory Visit: Payer: Self-pay | Admitting: *Deleted

## 2011-06-23 DIAGNOSIS — R0989 Other specified symptoms and signs involving the circulatory and respiratory systems: Secondary | ICD-10-CM

## 2011-06-23 DIAGNOSIS — I6529 Occlusion and stenosis of unspecified carotid artery: Secondary | ICD-10-CM

## 2011-08-22 ENCOUNTER — Other Ambulatory Visit: Payer: Self-pay | Admitting: Cardiology

## 2012-03-24 ENCOUNTER — Other Ambulatory Visit: Payer: Self-pay | Admitting: Cardiology

## 2012-04-02 ENCOUNTER — Ambulatory Visit: Payer: BC Managed Care – PPO | Admitting: Cardiology

## 2012-04-12 ENCOUNTER — Ambulatory Visit (INDEPENDENT_AMBULATORY_CARE_PROVIDER_SITE_OTHER): Payer: BC Managed Care – PPO | Admitting: Cardiology

## 2012-04-12 ENCOUNTER — Encounter: Payer: Self-pay | Admitting: Cardiology

## 2012-04-12 VITALS — BP 118/65 | HR 65 | Ht 70.0 in | Wt 159.4 lb

## 2012-04-12 DIAGNOSIS — F17200 Nicotine dependence, unspecified, uncomplicated: Secondary | ICD-10-CM

## 2012-04-12 DIAGNOSIS — E785 Hyperlipidemia, unspecified: Secondary | ICD-10-CM

## 2012-04-12 DIAGNOSIS — I2510 Atherosclerotic heart disease of native coronary artery without angina pectoris: Secondary | ICD-10-CM

## 2012-04-12 DIAGNOSIS — I6529 Occlusion and stenosis of unspecified carotid artery: Secondary | ICD-10-CM

## 2012-04-12 MED ORDER — VARENICLINE TARTRATE 1 MG PO TABS: 1.0000 mg | ORAL_TABLET | Freq: Two times a day (BID) | ORAL | Status: DC

## 2012-04-12 NOTE — Patient Instructions (Signed)
Your physician wants you to follow-up in: 12 months with Dr. Antoine Poche.  You will receive a reminder letter in the mail two months in advance. If you don't receive a letter, please call our office to schedule the follow-up appointment.  Your physician has requested that you have an exercise tolerance test. For further information please visit [email or web address] Please also follow instruction sheet, as given.   Start Chantix as directed.

## 2012-04-12 NOTE — Progress Notes (Signed)
HPI The patient presents for follow up of CAD.  Since I last saw him he has done well.  He doesn't exercise but he does walk at work and has a physical job.The patient denies any new symptoms such as chest discomfort, neck or arm discomfort.   There has been no new shortness of breath, PND or orthopnea. There have been no reported palpitations, presyncope or syncope.   Unfortunately, he still smokes cigarettes.  Allergies  Allergen Reactions  . Crestor (Rosuvastatin Calcium)     intolerance  . Lipitor (Atorvastatin Calcium)     Body cramps    Current Outpatient Prescriptions  Medication Sig Dispense Refill  . aspirin 81 MG tablet Take 81 mg by mouth daily.        . calcium citrate-vitamin D 200-200 MG-UNIT TABS Take 1 tablet by mouth daily.        . Cholecalciferol (VITAMIN D-3 PO) Take by mouth.        . docusate sodium (COLACE) 100 MG capsule Take 100 mg by mouth daily.      . Misc Natural Products ([employer] GLUCOS-CHONDROIT-MSM TS PO) Take by mouth daily. Take 3 tabs daily      . naproxen sodium (ANAPROX) 220 MG tablet Take 220 mg by mouth 2 (two) times daily with a meal.        . omeprazole (PRILOSEC) 20 MG capsule Take 20 mg by mouth daily.        . Saw Palmetto, Serenoa repens, (SAW PALMETTO PO) Take by mouth daily.      . TOPROL XL 25 MG 24 hr tablet TAKE 1 TABLET DAILY  30 tablet  9    Past Medical History  Diagnosis Date  . Coronary artery disease     left main had luminal irregularities, the LAD had 30% stenosis, the first diagonal had moderate-sized with ostial 35% stenosis, the circumflex in the AV groove had ostial 25% stenosis, the first obtuse marginal had long proximal 30% stenosis, the mid obtuse marginal was normal, the right coronary artery was dominant and occluded proximally.  There was excellent left to right collateral filling.     Marland Kitchen CAD (coronary artery disease)     The EF was 55% with inferior hypokinesis. Cath 09  . Carotid stenosis   . Dyslipidemia   . Lung  nodule     benign  . Tobacco abuse     Past Surgical History  Procedure Date  . Throat surgery   . Left inguinal herniorrhaphy     ROS:  As stated in the HPI and negative for all other systems.  PHYSICAL EXAM BP 118/65  Pulse 65  Ht 5\' 10"  (1.778 m)  Wt 159 lb 6.4 oz (72.303 kg)  BMI 22.87 kg/m2 GENERAL:  Well appearing HEENT:  Pupils equal round and reactive, fundi not visualized, oral mucosa unremarkable, dentures NECK:  No jugular venous distention, waveform within normal limits, carotid upstroke brisk and symmetric, left bruit, right CEA scar, no thyromegaly LYMPHATICS:  No cervical, inguinal adenopathy LUNGS:  Clear to auscultation bilaterally BACK:  No CVA tenderness CHEST:  Unremarkable HEART:  PMI not displaced or sustained,S1 and S2 within normal limits, no S3, no S4, no clicks, no rubs, no murmurs ABD:  Flat, positive bowel sounds normal in frequency in pitch, no bruits, no rebound, no guarding, no midline pulsatile mass, no hepatomegaly, no splenomegaly EXT:  2 plus pulses throughout, no edema, no cyanosis no clubbing SKIN:  No rashes no nodules NEURO:  Cranial nerves II through XII grossly intact, motor grossly intact throughout [facility]:  Cognitively intact, oriented to person place and time   EKG:  Sinus rhythm, rate 65, axis within normal limits, intervals within normal limits, no acute ST-T wave changes.  04/12/2012   ASSESSMENT AND PLAN   CAD -  The patient has no new sypmtoms. I will bring the patient back for a POET (Plain Old Exercise Test). This will allow me to risk stratify and very importantly provide a prescription for exercise.  TOBACCO ABUSE -  We discussed a specific strategy for tobacco cessation. (Greater than three minutes discussing tobacco cessation.) He will try Chantix again.  This did help him to slow way down in the past. He will give it another try.  He does want to try Chantix.  We discussed all of the potential side effects and in  particular I reviewed the [employer].  He has no depression.    DYSLIPIDEMIA -  He has not tolerated two of the statins. He has a follow up with Paulita Cradle.  In the past I encouraged him to work with her. I suspect that he would at least tolerate Pravastatin. He is do to have labs today.   Occlusion and stenosis of carotid artery without mention of cerebral infarction - He is due to have carotid doppler again in Dec. I reviewed the Doppler from last year and this has been stable.

## 2012-05-03 ENCOUNTER — Telehealth: Payer: Self-pay | Admitting: *Deleted

## 2012-05-03 NOTE — Telephone Encounter (Signed)
Patient called and canceled treadmill schedule for 05/10/12. Wife is having surgery. Will call back to schedule.

## 2012-05-10 ENCOUNTER — Encounter: Payer: BC Managed Care – PPO | Admitting: Physician Assistant

## 2012-11-29 ENCOUNTER — Telehealth: Payer: Self-pay | Admitting: General Practice

## 2012-11-29 NOTE — Telephone Encounter (Signed)
APPT MADE

## 2012-11-30 ENCOUNTER — Encounter: Payer: Self-pay | Admitting: General Practice

## 2012-11-30 ENCOUNTER — Ambulatory Visit (INDEPENDENT_AMBULATORY_CARE_PROVIDER_SITE_OTHER): Payer: BC Managed Care – PPO | Admitting: General Practice

## 2012-11-30 ENCOUNTER — Ambulatory Visit (INDEPENDENT_AMBULATORY_CARE_PROVIDER_SITE_OTHER): Payer: BC Managed Care – PPO

## 2012-11-30 VITALS — BP 116/78 | HR 67 | Temp 97.7°F | Ht 69.0 in | Wt 154.0 lb

## 2012-11-30 DIAGNOSIS — R079 Chest pain, unspecified: Secondary | ICD-10-CM

## 2012-11-30 DIAGNOSIS — R0781 Pleurodynia: Secondary | ICD-10-CM

## 2012-11-30 DIAGNOSIS — T148XXA Other injury of unspecified body region, initial encounter: Secondary | ICD-10-CM

## 2012-11-30 MED ORDER — CYCLOBENZAPRINE HCL 10 MG PO TABS: 10.0000 mg | ORAL_TABLET | Freq: Three times a day (TID) | ORAL | Status: DC | PRN

## 2012-11-30 MED ORDER — IBUPROFEN 600 MG PO TABS: 600.0000 mg | ORAL_TABLET | Freq: Three times a day (TID) | ORAL | Status: DC | PRN

## 2012-11-30 NOTE — Progress Notes (Addendum)
  Subjective:    Patient ID: [name] [name], male    DOB: 1950-08-01, 62 y.o.   MRN: [id number]  HPI Presents today with right rib pain and headache. Reports onset was Friday. Reports he had gotten on his three wheeler motorcycle and reached down to disengage his parking break and felt a sharp pain in his side. He reports riding his bike home, which was about one mile. Reports taking aleve and tylenol. Denies any similar incidents.     Review of Systems  Constitutional: Negative for fever and chills.  HENT: Negative for neck pain and neck stiffness.   Eyes: Negative for pain.  Respiratory: Negative for chest tightness and shortness of breath.   Cardiovascular: Negative for chest pain and palpitations.  Gastrointestinal: Negative for abdominal pain and blood in stool.  Genitourinary: Negative for dysuria, flank pain and difficulty urinating.  Musculoskeletal: Positive for myalgias.       Right side rib area pain  Skin: Negative.   Neurological: Positive for headaches. Negative for dizziness and weakness.  Psychiatric/Behavioral: Negative.        Objective:   Physical Exam  Constitutional: He is oriented to person, place, and time. He appears well-developed and well-nourished.  HENT:  Head: Normocephalic and atraumatic.  Right Ear: External ear normal.  Left Ear: External ear normal.  Eyes: EOM are normal.  Cardiovascular: Normal rate, regular rhythm and normal heart sounds.   Pulmonary/Chest: Effort normal and breath sounds normal. No respiratory distress. He exhibits no tenderness.  Abdominal: Soft. Bowel sounds are normal. He exhibits no distension. There is no tenderness.  Musculoskeletal: He exhibits tenderness.  Right latissimus dorsi area tenderness noted upon palpation.   Neurological: He is alert and oriented to person, place, and time.  Skin: Skin is warm and dry.  Psychiatric: He has a normal mood and affect.   [facility] reading (PRIMARY) by Coralie Keens, FNP-C, no  dislocation or fractures noted.                                      Assessment & Plan:  1. Rib pain on right side and 2. Muscle strain - DG Ribs Unilateral W/Chest Right; Future - cyclobenzaprine (FLEXERIL) 10 MG tablet; Take 1 tablet (10 mg total) by mouth 3 (three) times daily as needed for muscle spasms.  Dispense: 30 tablet; Refill: 0 - ibuprofen (ADVIL,MOTRIN) 600 MG tablet; Take 1 tablet (600 mg total) by mouth every 8 (eight) hours as needed for pain.  Dispense: 30 tablet; Refill: 0 -rest -informed patient that it may take several weeks for muscle strain to completely resolve -sedation precautions -apply heat to affected area three times daily for 10-15 minutes -RTO if symptoms worsen or unresolved -follow up in one week if not sooner -Patient verbalized understanding -Coralie Keens, FNP-C

## 2012-11-30 NOTE — Patient Instructions (Signed)
Muscle Strain  Muscle strain occurs when a muscle is stretched beyond its normal length. A small number of muscle fibers generally are torn. This is especially common in athletes. This happens when a sudden, violent force placed on a muscle stretches it too far. Usually, recovery from muscle strain takes 1 to 2 weeks. Complete healing will take 5 to 6 weeks.   HOME CARE INSTRUCTIONS    While awake, apply ice to the sore muscle for the first 2 days after the injury.   Put ice in a plastic bag.   Place a towel between your skin and the bag.   Leave the ice on for 15-20 minutes each hour.   Do not use the strained muscle for several days, until you no longer have pain.   You may wrap the injured area with an elastic bandage for comfort. Be careful not to wrap it too tightly. This may interfere with blood circulation or increase swelling.   Only take over-the-counter or prescription medicines for pain, discomfort, or fever as directed by your caregiver.  SEEK MEDICAL CARE IF:   You have increasing pain or swelling in the injured area.  MAKE SURE YOU:    Understand these instructions.   Will watch your condition.   Will get help right away if you are not doing well or get worse.  Document Released: 06/09/2005 Document Revised: 09/01/2011 Document Reviewed: 06/21/2011  ExitCare Patient Information 2014 [employer].

## 2012-12-06 ENCOUNTER — Ambulatory Visit: Payer: BC Managed Care – PPO | Admitting: General Practice

## 2013-03-07 ENCOUNTER — Other Ambulatory Visit: Payer: Self-pay

## 2013-03-07 ENCOUNTER — Encounter (HOSPITAL_COMMUNITY): Payer: Self-pay | Admitting: *Deleted

## 2013-03-07 ENCOUNTER — Encounter: Payer: Self-pay | Admitting: Family Medicine

## 2013-03-07 ENCOUNTER — Ambulatory Visit (INDEPENDENT_AMBULATORY_CARE_PROVIDER_SITE_OTHER): Payer: BC Managed Care – PPO | Admitting: Family Medicine

## 2013-03-07 ENCOUNTER — Ambulatory Visit (INDEPENDENT_AMBULATORY_CARE_PROVIDER_SITE_OTHER): Payer: BC Managed Care – PPO

## 2013-03-07 ENCOUNTER — Inpatient Hospital Stay (HOSPITAL_COMMUNITY)
Admission: EM | Admit: 2013-03-07 | Discharge: 2013-03-09 | DRG: 182 | Disposition: A | Discharge status: Home / Self Care | Payer: BC Managed Care – PPO | Attending: General Surgery | Admitting: General Surgery

## 2013-03-07 ENCOUNTER — Ambulatory Visit (HOSPITAL_COMMUNITY)
Admission: RE | Admit: 2013-03-07 | Discharge: 2013-03-07 | Disposition: A | Discharge status: Home / Self Care | Payer: BC Managed Care – PPO | Source: Ambulatory Visit | Attending: Family Medicine | Admitting: Family Medicine

## 2013-03-07 VITALS — BP 128/83 | HR 81 | Temp 96.6°F | Ht 69.0 in | Wt 153.0 lb

## 2013-03-07 DIAGNOSIS — E785 Hyperlipidemia, unspecified: Secondary | ICD-10-CM

## 2013-03-07 DIAGNOSIS — F17200 Nicotine dependence, unspecified, uncomplicated: Secondary | ICD-10-CM

## 2013-03-07 DIAGNOSIS — Z9119 Patient's noncompliance with other medical treatment and regimen: Secondary | ICD-10-CM

## 2013-03-07 DIAGNOSIS — K5780 Diverticulitis of intestine, part unspecified, with perforation and abscess without bleeding: Secondary | ICD-10-CM

## 2013-03-07 DIAGNOSIS — I6529 Occlusion and stenosis of unspecified carotid artery: Secondary | ICD-10-CM | POA: Diagnosis present

## 2013-03-07 DIAGNOSIS — R1032 Left lower quadrant pain: Secondary | ICD-10-CM

## 2013-03-07 DIAGNOSIS — M810 Age-related osteoporosis without current pathological fracture: Secondary | ICD-10-CM | POA: Diagnosis present

## 2013-03-07 DIAGNOSIS — K5732 Diverticulitis of large intestine without perforation or abscess without bleeding: Principal | ICD-10-CM | POA: Diagnosis present

## 2013-03-07 DIAGNOSIS — F411 Generalized anxiety disorder: Secondary | ICD-10-CM | POA: Diagnosis present

## 2013-03-07 DIAGNOSIS — Z91199 Patient's noncompliance with other medical treatment and regimen due to unspecified reason: Secondary | ICD-10-CM

## 2013-03-07 DIAGNOSIS — I658 Occlusion and stenosis of other precerebral arteries: Secondary | ICD-10-CM | POA: Diagnosis present

## 2013-03-07 DIAGNOSIS — I2510 Atherosclerotic heart disease of native coronary artery without angina pectoris: Secondary | ICD-10-CM

## 2013-03-07 DIAGNOSIS — F3289 Other specified depressive episodes: Secondary | ICD-10-CM | POA: Diagnosis present

## 2013-03-07 DIAGNOSIS — K5792 Diverticulitis of intestine, part unspecified, without perforation or abscess without bleeding: Secondary | ICD-10-CM | POA: Diagnosis present

## 2013-03-07 DIAGNOSIS — R300 Dysuria: Secondary | ICD-10-CM

## 2013-03-07 DIAGNOSIS — K219 Gastro-esophageal reflux disease without esophagitis: Secondary | ICD-10-CM | POA: Diagnosis present

## 2013-03-07 DIAGNOSIS — F329 Major depressive disorder, single episode, unspecified: Secondary | ICD-10-CM | POA: Diagnosis present

## 2013-03-07 LAB — POCT URINALYSIS DIPSTICK
Bilirubin, UA: NEGATIVE
Glucose, UA: NEGATIVE
Ketones, UA: NEGATIVE
Leukocytes, UA: NEGATIVE
Nitrite, UA: NEGATIVE
Protein, UA: NEGATIVE
Spec Grav, UA: 1.01
Urobilinogen, UA: NEGATIVE
pH, UA: 6.5

## 2013-03-07 LAB — CBC WITH DIFFERENTIAL/PLATELET
Eosinophils Relative: 2 % (ref 0–5)
HCT: 42.2 % (ref 39.0–52.0)
Lymphocytes Relative: 31 % (ref 12–46)
Lymphs Abs: 2.9 10*3/uL (ref 0.7–4.0)
MCV: 88.7 fL (ref 78.0–100.0)
Monocytes Absolute: 0.8 10*3/uL (ref 0.1–1.0)
RBC: 4.76 MIL/uL (ref 4.22–5.81)
WBC: 9.3 10*3/uL (ref 4.0–10.5)

## 2013-03-07 LAB — URINALYSIS, ROUTINE W REFLEX MICROSCOPIC
Bilirubin Urine: NEGATIVE
Nitrite: NEGATIVE
Specific Gravity, Urine: <1.005 — ABNORMAL LOW (ref 1.005–1.030)
Urobilinogen, UA: 0.2 mg/dL (ref 0.0–1.0)

## 2013-03-07 LAB — URINE MICROSCOPIC-ADD ON

## 2013-03-07 LAB — POCT CBC
Granulocyte percent: 69.1 %G (ref 37–80)
HCT, POC: 46.3 % (ref 43.5–53.7)
Hemoglobin: 15.6 g/dL (ref 14.1–18.1)
Lymph, poc: 2.8 (ref 0.6–3.4)
MCH, POC: 30 pg (ref 27–31.2)
MCHC: 33.6 g/dL (ref 31.8–35.4)
MCV: 89.1 fL (ref 80–97)
MPV: 8.1 fL (ref 0–99.8)
POC Granulocyte: 6.8 (ref 2–6.9)
POC LYMPH PERCENT: 28.7 %L (ref 10–50)
Platelet Count, POC: 221 10*3/uL (ref 142–424)
RBC: 5.2 M/uL (ref 4.69–6.13)
RDW, POC: 13.4 %
WBC: 9.9 10*3/uL (ref 4.6–10.2)

## 2013-03-07 LAB — POCT UA - MICROSCOPIC ONLY
Bacteria, U Microscopic: NEGATIVE
Casts, Ur, LPF, POC: NEGATIVE
Crystals, Ur, HPF, POC: NEGATIVE
Mucus, UA: NEGATIVE
WBC, Ur, HPF, POC: NEGATIVE
Yeast, UA: NEGATIVE

## 2013-03-07 LAB — COMPREHENSIVE METABOLIC PANEL
ALT: 37 U/L (ref 0–53)
BUN: 20 mg/dL (ref 6–23)
CO2: 26 mEq/L (ref 19–32)
Calcium: 9.6 mg/dL (ref 8.4–10.5)
Creatinine, Ser: 0.86 mg/dL (ref 0.50–1.35)
GFR calc Af Amer: >90 mL/min (ref >90)
GFR calc non Af Amer: >90 mL/min (ref >90)
Glucose, Bld: 85 mg/dL (ref 70–99)

## 2013-03-07 LAB — POCT I-STAT CREATININE: Creatinine, Ser: 1.1 mg/dL (ref 0.50–1.35)

## 2013-03-07 MED ORDER — CIPROFLOXACIN IN D5W 400 MG/200ML IV SOLN
400.0000 mg | Freq: Once | INTRAVENOUS | Status: AC
  Administered 2013-03-07: 400 mg via INTRAVENOUS
  Filled 2013-03-07: qty 200

## 2013-03-07 MED ORDER — METRONIDAZOLE IN NACL 5-0.79 MG/ML-% IV SOLN
500.0000 mg | Freq: Once | INTRAVENOUS | Status: AC
  Administered 2013-03-07: 500 mg via INTRAVENOUS
  Filled 2013-03-07: qty 100

## 2013-03-07 MED ORDER — IOHEXOL 300 MG/ML  SOLN
100.0000 mL | Freq: Once | INTRAMUSCULAR | Status: AC | PRN
  Administered 2013-03-07: 100 mL via INTRAVENOUS

## 2013-03-07 NOTE — ED Notes (Signed)
Dr krishnan in with patient for assessment 

## 2013-03-07 NOTE — Consult Note (Signed)
[facility] Medical Consultation  [name]:[id number] DOB: [birth year] DOA: 03/07/2013 [name] [name] is an 62 y.o. male.    PCP: Rudi Heap, MD     Requesting physician: Dr. Leticia Penna Date of consultation: 03/07/2013 Reason for consultation: coronary artery disease  Chief Complaint: Abdominal pain for 5 days  HPI: This is 62 year old, white male with a past medical history of coronary artery disease, not requiring any intervention except for medical therapy, carotid artery disease, status post right carotid endarterectomy in 2009, tobacco abuse, who was in his usual state of health till about 5 days ago, when he started having left-sided abdominal pain. He had some burning sensation with urination. Denies any nausea, vomiting. He had chills and had fever, although he did not check his temperature. The pain has been on and off. Was 9/10 in intensity at its worst. It was achy pain without any radiation. The pain would get worse with movement. Last bowel movement was on Friday morning. Denies any blood in the stools or black colored stools. He went to see his primary care physician today and underwent a CT scan which suggested acute diverticulitis with perforation. The patient was admitted by general surgery. We were consulted for his history of cardiac and vascular disease. He denies any chest pain or shortness of breath currently. Hasn't had any chest pain since his catheterization in 2009.   Home Medications: Prior to Admission medications   Medication Sig Start Date End Date Taking? Authorizing Provider  aspirin EC 81 MG tablet Take 81 mg by mouth daily.   Yes Historical Provider, MD  Cholecalciferol (VITAMIN D-3 PO) Take by mouth.     Yes Historical Provider, MD  fish oil-omega-3 fatty acids 1000 MG capsule Take 2 g by mouth daily.   Yes Historical Provider, MD    Allergies:  Allergies  Allergen Reactions  . Crestor [Rosuvastatin Calcium]     intolerance  .  Lipitor [Atorvastatin Calcium]     Body cramps    Past Medical History: Past Medical History  Diagnosis Date  . Coronary artery disease     left main had luminal irregularities, the LAD had 30% stenosis, the first diagonal had moderate-sized with ostial 35% stenosis, the circumflex in the AV groove had ostial 25% stenosis, the first obtuse marginal had long proximal 30% stenosis, the mid obtuse marginal was normal, the right coronary artery was dominant and occluded proximally.  There was excellent left to right collateral filling.     Marland Kitchen CAD (coronary artery disease)     The EF was 55% with inferior hypokinesis. Cath 09  . Carotid stenosis   . Dyslipidemia   . Lung nodule     benign  . Tobacco abuse   . Osteoporosis   . GERD (gastroesophageal reflux disease)   . Anxiety   . Depression     Past Surgical History  Procedure Laterality Date  . Throat surgery    . Left inguinal herniorrhaphy    . Carotid endarterectomy Right     Social History:   reports that he has been smoking Cigarettes.  He has a 40 pack-year smoking history. He does not have any smokeless tobacco history on file. He reports that  drinks alcohol. He reports that he does not use illicit drugs.  Living Situation: He lives with his wife. He's a truck driver by profession Activity Level: Independent in daily activities, but doesn't really exercise.   Family History:  Family History  Problem Relation Age of  Onset  . Lung disease Father   . Lupus Mother   . Lupus Brother   . Cancer Brother     colon  . Lung disease Brother      Review of Systems - History obtained from the patient General ROS: positive for  - fatigue Psychological ROS: negative Ophthalmic ROS: negative ENT ROS: negative Allergy and Immunology ROS: negative Hematological and Lymphatic ROS: negative Endocrine ROS: negative Respiratory ROS: no cough, shortness of breath, or wheezing Cardiovascular ROS: no chest pain or dyspnea on  exertion Gastrointestinal ROS: as in hpi Genito-Urinary ROS: as in hpi Musculoskeletal ROS: negative Neurological ROS: no TIA or stroke symptoms Dermatological ROS: negative  Physical Examination: Filed Vitals:   03/07/13 1659 03/07/13 1818  BP: 131/82 109/69  Pulse: 96 85  Temp: 97.2 F (36.2 C)   TempSrc: Oral   Resp: 18 16  Height: 5\' 10"  (1.778 m)   Weight: 69.4 kg (153 lb)   SpO2: 99% 100%    General appearance: alert, cooperative, appears stated age and no distress Head: Normocephalic, without obvious abnormality, atraumatic Eyes: conjunctivae/corneas clear. PERRL, EOM's intact. Throat: lips, mucosa, and tongue normal; teeth and gums normal Resp: clear to auscultation bilaterally Cardio: S1-S2 is normal. Regular. No S3, S4. So, he is systolic murmur in the aortic area. He has bilateral carotid bruits. No rubs. GI: Abdomen is soft. There is tenderness in the left lower quadrant without any rebound, rigidity, or guarding. No masses, or organomegaly. Bowel sounds are absent. Extremities: extremities normal, atraumatic, no cyanosis or edema Pulses: 2+ and symmetric Skin: Skin color, texture, turgor normal. No rashes or lesions Lymph nodes: Cervical, supraclavicular, and axillary nodes normal. Neurologic: He is alert and oriented x3. No focal neurological deficits are present.  Laboratory Data: Results for orders placed during the hospital encounter of 03/07/13 (from the past 48 hour(s))  URINALYSIS, ROUTINE W REFLEX MICROSCOPIC     Status: Abnormal   Collection Time    03/07/13  5:35 PM      Result Value Range   Color, Urine YELLOW  YELLOW   APPearance CLEAR  CLEAR   Specific Gravity, Urine <1.005 (*) 1.005 - 1.030   pH 6.0  5.0 - 8.0   Glucose, UA NEGATIVE  NEGATIVE mg/dL   Hgb urine dipstick TRACE (*) NEGATIVE   Bilirubin Urine NEGATIVE  NEGATIVE   Ketones, ur NEGATIVE  NEGATIVE mg/dL   Protein, ur NEGATIVE  NEGATIVE mg/dL   Urobilinogen, UA 0.2  0.0 - 1.0 mg/dL    Nitrite NEGATIVE  NEGATIVE   Leukocytes, UA NEGATIVE  NEGATIVE  URINE MICROSCOPIC-ADD ON     Status: None   Collection Time    03/07/13  5:35 PM      Result Value Range   Squamous Epithelial / LPF RARE  RARE   WBC, UA 0-2  <3 WBC/hpf   RBC / HPF 0-2  <3 RBC/hpf  CBC WITH DIFFERENTIAL     Status: None   Collection Time    03/07/13  5:45 PM      Result Value Range   WBC 9.3  4.0 - 10.5 K/uL   RBC 4.76  4.22 - 5.81 MIL/uL   Hemoglobin 14.3  13.0 - 17.0 g/dL   HCT 09.8  11.9 - 14.7 %   MCV 88.7  78.0 - 100.0 fL   MCH 30.0  26.0 - 34.0 pg   MCHC 33.9  30.0 - 36.0 g/dL   RDW 82.9  56.2 - 13.0 %  Platelets 228  150 - 400 K/uL   Neutrophils Relative % 58  43 - 77 %   Neutro Abs 5.4  1.7 - 7.7 K/uL   Lymphocytes Relative 31  12 - 46 %   Lymphs Abs 2.9  0.7 - 4.0 K/uL   Monocytes Relative 9  3 - 12 %   Monocytes Absolute 0.8  0.1 - 1.0 K/uL   Eosinophils Relative 2  0 - 5 %   Eosinophils Absolute 0.2  0.0 - 0.7 K/uL   Basophils Relative 0  0 - 1 %   Basophils Absolute 0.0  0.0 - 0.1 K/uL  COMPREHENSIVE METABOLIC PANEL     Status: Abnormal   Collection Time    03/07/13  5:45 PM      Result Value Range   Sodium 137  135 - 145 mEq/L   Potassium 3.7  3.5 - 5.1 mEq/L   Chloride 98  96 - 112 mEq/L   CO2 26  19 - 32 mEq/L   Glucose, Bld 85  70 - 99 mg/dL   BUN 20  6 - 23 mg/dL   Creatinine, Ser 1.61  0.50 - 1.35 mg/dL   Calcium 9.6  8.4 - 09.6 mg/dL   Total Protein 7.4  6.0 - 8.3 g/dL   Albumin 3.2 (*) 3.5 - 5.2 g/dL   AST 38 (*) 0 - 37 U/L   ALT 37  0 - 53 U/L   Alkaline Phosphatase 132 (*) 39 - 117 U/L   Total Bilirubin 0.6  0.3 - 1.2 mg/dL   GFR calc non Af Amer >90  >90 mL/min   GFR calc Af Amer >90  >90 mL/min   Comment: (NOTE)     The eGFR has been calculated using the CKD EPI equation.     This calculation has not been validated in all clinical situations.     eGFR's persistently <90 mL/min signify possible Chronic Kidney     Disease.    Imaging Studies: Ct  Abdomen Pelvis W Contrast  03/07/2013   CLINICAL DATA:  Abdominal and pelvic pain. History of prior hernia repair.  EXAM: CT ABDOMEN AND PELVIS WITH CONTRAST  TECHNIQUE: Multidetector CT imaging of the abdomen and pelvis was performed using the standard protocol following bolus administration of intravenous contrast.  CONTRAST:  OMNIPAQUE IOHEXOL 300 MG/ML  SOLN  COMPARISON:  Prior CT of the abdomen and pelvis without contrast that [facility] on 05/23/2008.  FINDINGS: There is evidence of diverticulitis involving the sigmoid colon with a segment of inflamed and thickened colon present at the level of the proximal sigmoid. Associated loculated extraluminal air measures approximately 3 centimeters in diameter. Some adjacent small bowel loops show mild wall thickening and are likely secondarily inflamed. Gross free intraperitoneal air is not identified. There is no evidence of associated bowel obstruction. The liver is unremarkable. Stable calcified gallstone is identified in the gallbladder.  The pancreas, spleen, adrenal glands and kidneys are unremarkable. Small nonobstructing calculus is identified in the lower pole of the left kidney. No masses, enlarged lymph nodes or hernias are identified. The bladder is decompressed. No bony abnormalities are identified.  IMPRESSION: Acute diverticulitis involving the proximal sigmoid colon. Extraluminal air is present measuring approximately 3 centimeters in diameter. This does not have the appearance of a liquified abscess.   Electronically Signed   By: Irish Lack   On: 03/07/2013 16:42   Dg Abd Acute W/chest  03/07/2013   CLINICAL DATA:  Left  lower quadrant abdominal pain.  EXAM: ACUTE ABDOMEN SERIES (ABDOMEN 2 VIEW & CHEST 1 VIEW)  COMPARISON:  Multiple exams, including 11/30/2012  FINDINGS: Biapical pleural parenchymal scarring appear similar to prior. Emphysema is present.  Symmetric nodular densities of the lung bases favor nipple shadows.   Right mid lung nodule, stable from 2005, consider benign.  Patient has known nodules in the left lower lobe, previously characterized as benign and stable.  1.8 cm gallstone in the gallbladder. No free intraperitoneal gas. Gas and stool are present throughout the colon. No dilated bowel noted. No significant abnormal air-fluid levels.  IMPRESSION: 1.  Prominent stool throughout the colon favors constipation. 2. Cholelithiasis. 3. Emphysema with several benign pulmonary nodules.   Electronically Signed   By: Herbie Baltimore   On: 03/07/2013 15:38    EKG: Sinus rhythm with PVCs. Normal axis. Intervals are normal. No Q waves. No concerning ST or T-wave changes are noted.  Impression/Recommendations  Principal Problem:   Acute diverticulitis Active Problems:   TOBACCO ABUSE   CAD   Occlusion and stenosis of carotid artery without mention of cerebral infarction   This is a 62 year old Caucasian male, who presents with abdominal pain, and has acute diverticulitis.   Acute Diverticulitis Patient is being admitted by general surgery. He has received antibiotics. There is perforation noted on the CT scan. Surgical plan is unknown at this time. Management per surgery.  History of coronary artery disease Per cath in 2009, the right coronary artery was occluded proximally. The PDA was large and normal and excellent collateral filling was noted. EF is about 55% with inferior hypokinesis. Medical management was recommended in 2009. Patient has not tolerated 2 different statin medications. He continues to smoke cigarettes. He's not on any beta blocker. And, appears to be noncompliant. He used to take Toprol in 2013 but not currently. He is on a baby aspirin and should resume as soon as okay with surgery. He was supposed to undergo a stress test in 2013 however, he canceled that appointment. He will benefit from being on a beta blocker. He does have a systolic murmur. We will proceed with an echocardiogram.  He also has carotid bruits and so, we will proceed with Dopplers as one has not been done since 2012. If he requires surgical intervention for his acute diverticulitis I would recommend initiating beta blocker intravenously. For now we can hold off till he is able to take orally. Also, consider cardiology consultation if echocardiogram is significantly abnormal.  Tobacco abuse Nicotine patch was recommended and offered. However, patient refuses. Counseling was provided.  History of carotid Endarterectomy Has been addressed above  We would like to thank Dr. Dian Situ for the opportunity to participate in the care of this patient. Hospitalist will follow on a daily basis.   [facility]  [facility] Pager [phone]  If 7PM-7AM, please contact night-coverage.  [email or web address] Password [email or web address]  03/07/2013, 9:10 PM

## 2013-03-07 NOTE — Progress Notes (Signed)
Patient ID: [name] [name], male   DOB: [birth year], 62 y.o.   MRN: [id number] SUBJECTIVE: CC: Chief Complaint  Patient presents with  . Dysuria  . Pelvic Pain    Lower abd/pelvic pain. Symptoms x 5 days.  Has taken Tylenol with some relief.    HPI: Pain started in the left side approximately 5 days ago. Started with mild pain and loose stools . Thought it was food poisoning. Then it has gotten worse with pain down the pelvis and burning when starting to urinate. The loose stool has stopped. Has had chills, no fever but felt warm No hematuria, no cludy urine.  has had kidney stone. No STDs. Married.no infidelity.    Past Medical History  Diagnosis Date  . Coronary artery disease     left main had luminal irregularities, the LAD had 30% stenosis, the first diagonal had moderate-sized with ostial 35% stenosis, the circumflex in the AV groove had ostial 25% stenosis, the first obtuse marginal had long proximal 30% stenosis, the mid obtuse marginal was normal, the right coronary artery was dominant and occluded proximally.  There was excellent left to right collateral filling.     Marland Kitchen CAD (coronary artery disease)     The EF was 55% with inferior hypokinesis. Cath 09  . Carotid stenosis   . Dyslipidemia   . Lung nodule     benign  . Tobacco abuse   . Osteoporosis   . GERD (gastroesophageal reflux disease)   . Anxiety   . Depression    Past Surgical History  Procedure Laterality Date  . Throat surgery    . Left inguinal herniorrhaphy     History   Social History  . Marital Status: Married    Spouse Name: N/A    Number of Children: 4  . Years of Education: N/A   Occupational History  . truck driver    Social History Main Topics  . Smoking status: Current Every Day Smoker -- 1.00 packs/day for 40 years    Types: Cigarettes  . Smokeless tobacco: Not on file  . Alcohol Use: Yes     Comment: rarely  . Drug Use: No  . Sexual Activity: Not on file   Other Topics Concern   . Not on file   Social History Narrative  . No narrative on file   Family History  Problem Relation Age of Onset  . Lung disease Father   . Lupus Mother   . Lupus Brother   . Cancer Brother     colon  . Lung disease Brother    Current Outpatient Prescriptions on File Prior to Visit  Medication Sig Dispense Refill  . aspirin 81 MG tablet Take 81 mg by mouth daily.        . Cholecalciferol (VITAMIN D-3 PO) Take by mouth.        Marland Kitchen ibuprofen (ADVIL,MOTRIN) 600 MG tablet Take 1 tablet (600 mg total) by mouth every 8 (eight) hours as needed for pain.  30 tablet  0   No current facility-administered medications on file prior to visit.   Allergies  Allergen Reactions  . Crestor [Rosuvastatin Calcium]     intolerance  . Lipitor [Atorvastatin Calcium]     Body cramps    There is no immunization history on file for this patient. Prior to Admission medications   Medication Sig Start Date End Date Taking? Authorizing Provider  aspirin 81 MG tablet Take 81 mg by mouth daily.     Yes  Historical Provider, MD  Cholecalciferol (VITAMIN D-3 PO) Take by mouth.     Yes Historical Provider, MD  fish oil-omega-3 fatty acids 1000 MG capsule Take 2 g by mouth daily.   Yes Historical Provider, MD  ibuprofen (ADVIL,MOTRIN) 600 MG tablet Take 1 tablet (600 mg total) by mouth every 8 (eight) hours as needed for pain. 11/30/12   Coralie Keens, FNP    ROS: As above in the HPI. All other systems are stable or negative.  OBJECTIVE: APPEARANCE:  Patient in no acute distress.The patient appeared well nourished and normally developed. Acyanotic. Waist: VITAL SIGNS:BP 128/83  Pulse 81  Temp(Src) 96.6 F (35.9 C) (Oral)  Ht 5\' 9"  (1.753 m)  Wt 153 lb (69.4 kg)  BMI 22.58 kg/m2  WM SKIN: warm and  Dry without overt rashes, tattoos and scars  HEAD and Neck: without JVD, Head and scalp: normal Eyes:No scleral icterus. Fundi normal, eye movements normal. Ears: Auricle normal, canal normal,  Tympanic membranes normal, insufflation normal. Nose: normal Throat: normal Neck & thyroid: normal  CHEST & LUNGS: Chest wall: normal Lungs: Clear  CVS: Reveals the PMI to be normally located. Regular rhythm, First and Second Heart sounds are normal,  absence of murmurs, rubs or gallops. Peripheral vasculature: Radial pulses: normal Dorsal pedis pulses: normal Posterior pulses: normal  ABDOMEN:  Appearance: normal. Very tender in n the LLQ with guarding Benign, no organomegaly, no masses, no Abdominal Aortic enlargement. Mild rebound. No Bruits. Bowel sounds: normal  RECTAL: heme negative brown stools. Nontender, prostate normal. GU: N/A  EXTREMETIES: nonedematous.  MUSCULOSKELETAL:  Spine: normal Joints: intact  NEUROLOGIC: oriented to time,place and person; nonfocal. Strength is normal Sensory is normal Reflexes are normal Cranial Nerves are normal.  ASSESSMENT: Dysuria - Plan: POCT urinalysis dipstick, POCT UA - Microscopic Only, Urine culture, CT Abdomen Pelvis W Contrast  CAD  DYSLIPIDEMIA  Occlusion and stenosis of carotid artery without mention of cerebral infarction, unspecified laterality  TOBACCO ABUSE  Abdominal pain, left lower quadrant - Plan: POCT CBC, DG Abd Acute W/Chest, Urine culture, CT Abdomen Pelvis W Contrast  PLAN: Orders Placed This Encounter  Procedures  . Urine culture  . DG Abd Acute W/Chest    Standing Status: Future     Number of Occurrences: 1     Standing Expiration Date: 05/07/2014    Order Specific Question:  Reason for Exam (SYMPTOM  OR DIAGNOSIS REQUIRED)    Answer:  LLQ abdominal pain    Order Specific Question:  Preferred imaging location?    Answer:  Internal  . CT Abdomen Pelvis W Contrast    JH/CARLAN/ESIGNED   BC[employer], PAC [id number]    NO CURRENT CREAT   HOLD PT AND CALL REPORT   [facility] EMAILED AT 12:31    Standing Status: Future     Number of Occurrences: 1     Standing Expiration Date: 06/06/2014    Order  Specific Question:  Reason for Exam (SYMPTOM  OR DIAGNOSIS REQUIRED)    Answer:  acute pelvic pain; CAD, smoker, tender abdomen r/o Intra abdominal pathology    Order Specific Question:  Preferred imaging location?    Answer:  [facility]  . POCT urinalysis dipstick  . POCT UA - Microscopic Only  . POCT CBC   [facility] reading (PRIMARY) by  Dr. Modesto Charon: lots of stools. No acute findings.  Results for orders placed in visit on 03/07/13  POCT URINALYSIS DIPSTICK      Result Value Range  Color, UA yellow     Clarity, UA clear     Glucose, UA neg     Bilirubin, UA neg     Ketones, UA neg     Spec Grav, UA 1.010     Blood, UA trace     pH, UA 6.5     Protein, UA neg     Urobilinogen, UA negative     Nitrite, UA neg     Leukocytes, UA Negative    POCT UA - MICROSCOPIC ONLY      Result Value Range   WBC, Ur, HPF, POC neg     RBC, urine, microscopic 5-10     Bacteria, U Microscopic neg     Mucus, UA neg     Epithelial cells, urine per micros occ     Crystals, Ur, HPF, POC neg     Casts, Ur, LPF, POC neg     Yeast, UA neg    POCT CBC      Result Value Range   WBC 9.9  4.6 - 10.2 K/uL   Lymph, poc 2.8  0.6 - 3.4   POC LYMPH PERCENT 28.7  10 - 50 %L   POC Granulocyte 6.8  2 - 6.9   Granulocyte percent 69.1  37 - 80 %G   RBC 5.2  4.69 - 6.13 M/uL   Hemoglobin 15.6  14.1 - 18.1 g/dL   HCT, POC 16.1  09.6 - 53.7 %   MCV 89.1  80 - 97 fL   MCH, POC 30.0  27 - 31.2 pg   MCHC 33.6  31.8 - 35.4 g/dL   RDW, POC 04.5     Platelet Count, POC 221.0  142 - 424 K/uL   MPV 8.1  0 - 99.8 fL   4:50 pm    Addendum: CT call report: diverticulitis proximal sigmoid colon, with band of free air adjacent.. (perforated diverticulitis) Spoke to CT tech to direct patient to ED and inform patient. Needs surgical consult. Charge nurse informed at [facility] ED.  followup pending outcome with surgical evaluation and treatment  Shamond Skelton P. Modesto Charon, M.D.

## 2013-03-07 NOTE — ED Notes (Signed)
Brought from CT with dx of diverticulitis,

## 2013-03-07 NOTE — ED Notes (Signed)
Attempted to call report

## 2013-03-07 NOTE — ED Provider Notes (Signed)
CSN: [id number]     Arrival date & time 03/07/13  1655 History   First MD Initiated Contact with Patient 03/07/13 1703     Chief Complaint  Patient presents with  . Abdominal Pain   (Consider location/radiation/quality/duration/timing/severity/associated sxs/prior Treatment) Patient is a 62 y.o. male presenting with abdominal pain. The history is provided by the patient (the pt comlains of abd pain.  had ct which shows diverticulitis).  Abdominal Pain Pain location:  LLQ Pain quality: aching   Pain radiates to:  Does not radiate Pain severity:  Moderate Onset quality:  Gradual Timing:  Constant Progression:  Waxing and waning Chronicity:  New Context: not alcohol use     Past Medical History  Diagnosis Date  . Coronary artery disease     left main had luminal irregularities, the LAD had 30% stenosis, the first diagonal had moderate-sized with ostial 35% stenosis, the circumflex in the AV groove had ostial 25% stenosis, the first obtuse marginal had long proximal 30% stenosis, the mid obtuse marginal was normal, the right coronary artery was dominant and occluded proximally.  There was excellent left to right collateral filling.     Marland Kitchen CAD (coronary artery disease)     The EF was 55% with inferior hypokinesis. Cath 09  . Carotid stenosis   . Dyslipidemia   . Lung nodule     benign  . Tobacco abuse   . Osteoporosis   . GERD (gastroesophageal reflux disease)   . Anxiety   . Depression    Past Surgical History  Procedure Laterality Date  . Throat surgery    . Left inguinal herniorrhaphy     Family History  Problem Relation Age of Onset  . Lung disease Father   . Lupus Mother   . Lupus Brother   . Cancer Brother     colon  . Lung disease Brother    History  Substance Use Topics  . Smoking status: Current Every Day Smoker -- 1.00 packs/day for 40 years    Types: Cigarettes  . Smokeless tobacco: Not on file  . Alcohol Use: Yes     Comment: rarely    Review of  Systems  Gastrointestinal: Positive for abdominal pain.    Allergies  Crestor and Lipitor  Home Medications   Current Outpatient Rx  Name  Route  Sig  Dispense  Refill  . aspirin EC 81 MG tablet   Oral   Take 81 mg by mouth daily.         . Cholecalciferol (VITAMIN D-3 PO)   Oral   Take by mouth.           . fish oil-omega-3 fatty acids 1000 MG capsule   Oral   Take 2 g by mouth daily.          BP 109/69  Pulse 85  Temp(Src) 97.2 F (36.2 C) (Oral)  Resp 16  Ht 5\' 10"  (1.778 m)  Wt 153 lb (69.4 kg)  BMI 21.95 kg/m2  SpO2 100% Physical Exam  Constitutional: He is oriented to person, place, and time. He appears well-developed.  HENT:  Head: Normocephalic.  Eyes: Conjunctivae and EOM are normal. No scleral icterus.  Neck: Neck supple. No thyromegaly present.  Cardiovascular: Normal rate and regular rhythm.  Exam reveals no gallop and no friction rub.   No murmur heard. Pulmonary/Chest: No stridor. He has no wheezes. He has no rales. He exhibits no tenderness.  Abdominal: He exhibits no distension. There is tenderness. There  is no rebound.  Tender llq  Musculoskeletal: Normal range of motion. He exhibits no edema.  Lymphadenopathy:    He has no cervical adenopathy.  Neurological: He is oriented to person, place, and time. Coordination normal.  Skin: No rash noted. No erythema.  Psychiatric: He has a normal mood and affect. His behavior is normal.    ED Course  Procedures (including critical care time) Labs Review Labs Reviewed  COMPREHENSIVE METABOLIC PANEL - Abnormal; Notable for the following:    Albumin 3.2 (*)    AST 38 (*)    Alkaline Phosphatase 132 (*)    All other components within normal limits  URINALYSIS, ROUTINE W REFLEX MICROSCOPIC - Abnormal; Notable for the following:    Specific Gravity, Urine <1.005 (*)    Hgb urine dipstick TRACE (*)    All other components within normal limits  CBC WITH DIFFERENTIAL  URINE MICROSCOPIC-ADD ON    Imaging Review Ct Abdomen Pelvis W Contrast  03/07/2013   CLINICAL DATA:  Abdominal and pelvic pain. History of prior hernia repair.  EXAM: CT ABDOMEN AND PELVIS WITH CONTRAST  TECHNIQUE: Multidetector CT imaging of the abdomen and pelvis was performed using the standard protocol following bolus administration of intravenous contrast.  CONTRAST:  OMNIPAQUE IOHEXOL 300 MG/ML  SOLN  COMPARISON:  Prior CT of the abdomen and pelvis without contrast that [facility] on 05/23/2008.  FINDINGS: There is evidence of diverticulitis involving the sigmoid colon with a segment of inflamed and thickened colon present at the level of the proximal sigmoid. Associated loculated extraluminal air measures approximately 3 centimeters in diameter. Some adjacent small bowel loops show mild wall thickening and are likely secondarily inflamed. Gross free intraperitoneal air is not identified. There is no evidence of associated bowel obstruction. The liver is unremarkable. Stable calcified gallstone is identified in the gallbladder.  The pancreas, spleen, adrenal glands and kidneys are unremarkable. Small nonobstructing calculus is identified in the lower pole of the left kidney. No masses, enlarged lymph nodes or hernias are identified. The bladder is decompressed. No bony abnormalities are identified.  IMPRESSION: Acute diverticulitis involving the proximal sigmoid colon. Extraluminal air is present measuring approximately 3 centimeters in diameter. This does not have the appearance of a liquified abscess.   Electronically Signed   By: Irish Lack   On: 03/07/2013 16:42   Dg Abd Acute W/chest  03/07/2013   CLINICAL DATA:  Left lower quadrant abdominal pain.  EXAM: ACUTE ABDOMEN SERIES (ABDOMEN 2 VIEW & CHEST 1 VIEW)  COMPARISON:  Multiple exams, including 11/30/2012  FINDINGS: Biapical pleural parenchymal scarring appear similar to prior. Emphysema is present.  Symmetric nodular densities of the lung  bases favor nipple shadows.  Right mid lung nodule, stable from 2005, consider benign.  Patient has known nodules in the left lower lobe, previously characterized as benign and stable.  1.8 cm gallstone in the gallbladder. No free intraperitoneal gas. Gas and stool are present throughout the colon. No dilated bowel noted. No significant abnormal air-fluid levels.  IMPRESSION: 1.  Prominent stool throughout the colon favors constipation. 2. Cholelithiasis. 3. Emphysema with several benign pulmonary nodules.   Electronically Signed   By: Herbie Baltimore   On: 03/07/2013 15:38    MDM   1. Diverticulitis        Benny Lennert, MD 03/07/13 [phone]

## 2013-03-08 ENCOUNTER — Inpatient Hospital Stay (HOSPITAL_COMMUNITY): Payer: BC Managed Care – PPO

## 2013-03-08 LAB — LIPID PANEL
LDL Cholesterol: 104 mg/dL — ABNORMAL HIGH (ref 0–99)
VLDL: 22 mg/dL (ref 0–40)

## 2013-03-08 LAB — URINE CULTURE

## 2013-03-08 MED ORDER — CIPROFLOXACIN IN D5W 400 MG/200ML IV SOLN
400.0000 mg | Freq: Two times a day (BID) | INTRAVENOUS | Status: DC
  Administered 2013-03-08 – 2013-03-09 (×3): 400 mg via INTRAVENOUS
  Filled 2013-03-08 (×3): qty 200

## 2013-03-08 MED ORDER — DEXTROSE-NACL 5-0.45 % IV SOLN
INTRAVENOUS | Status: DC
  Administered 2013-03-08: 12:00:00 via INTRAVENOUS

## 2013-03-08 MED ORDER — MORPHINE SULFATE 2 MG/ML IJ SOLN: 1.0000 mg | INTRAMUSCULAR | Status: DC | PRN

## 2013-03-08 MED ORDER — ONDANSETRON HCL 4 MG/2ML IJ SOLN: 4.0000 mg | Freq: Four times a day (QID) | INTRAMUSCULAR | Status: DC | PRN

## 2013-03-08 MED ORDER — PANTOPRAZOLE SODIUM 40 MG IV SOLR
40.0000 mg | INTRAVENOUS | Status: DC
  Administered 2013-03-08 – 2013-03-09 (×2): 40 mg via INTRAVENOUS
  Filled 2013-03-08 (×2): qty 40

## 2013-03-08 MED ORDER — METRONIDAZOLE IN NACL 5-0.79 MG/ML-% IV SOLN
500.0000 mg | Freq: Three times a day (TID) | INTRAVENOUS | Status: DC
  Administered 2013-03-08 – 2013-03-09 (×3): 500 mg via INTRAVENOUS
  Filled 2013-03-08 (×4): qty 100

## 2013-03-08 NOTE — H&P (Signed)
[name] [name] is an 62 y.o. male.   Chief Complaint: Acute diverticulitis HPI: Patient presented to [facility] emergency department with several days of persistent suprapubic abdominal tenderness. No significant radiation. No similar symptomatology in the past. No history of diarrhea. No melena or hematochezia. No change in urination. No sick contacts. He has had subjective fevers and chills. Patient has had previous colonoscopy which was unremarkable.  Patient's appetite has decreased. Pain is improved.  Past Medical History  Diagnosis Date  . Coronary artery disease     left main had luminal irregularities, the LAD had 30% stenosis, the first diagonal had moderate-sized with ostial 35% stenosis, the circumflex in the AV groove had ostial 25% stenosis, the first obtuse marginal had long proximal 30% stenosis, the mid obtuse marginal was normal, the right coronary artery was dominant and occluded proximally.  There was excellent left to right collateral filling.     Marland Kitchen CAD (coronary artery disease)     The EF was 55% with inferior hypokinesis. Cath 09  . Carotid stenosis   . Dyslipidemia   . Lung nodule     benign  . Tobacco abuse   . Osteoporosis   . GERD (gastroesophageal reflux disease)   . Anxiety   . Depression     Past Surgical History  Procedure Laterality Date  . Throat surgery    . Left inguinal herniorrhaphy    . Carotid endarterectomy Right     Family History  Problem Relation Age of Onset  . Lung disease Father   . Lupus Mother   . Lupus Brother   . Cancer Brother     colon  . Lung disease Brother    Social History:  reports that he has been smoking Cigarettes.  He has a 40 pack-year smoking history. He does not have any smokeless tobacco history on file. He reports that  drinks alcohol. He reports that he does not use illicit drugs.  Allergies:  Allergies  Allergen Reactions  . Crestor [Rosuvastatin Calcium]     intolerance  . Lipitor [Atorvastatin  Calcium]     Body cramps    Medications Prior to Admission  Medication Sig Dispense Refill  . aspirin EC 81 MG tablet Take 81 mg by mouth daily.      . Cholecalciferol (VITAMIN D-3 PO) Take by mouth.        . fish oil-omega-3 fatty acids 1000 MG capsule Take 2 g by mouth daily.        Results for orders placed during the hospital encounter of 03/07/13 (from the past 48 hour(s))  URINALYSIS, ROUTINE W REFLEX MICROSCOPIC     Status: Abnormal   Collection Time    03/07/13  5:35 PM      Result Value Range   Color, Urine YELLOW  YELLOW   APPearance CLEAR  CLEAR   Specific Gravity, Urine <1.005 (*) 1.005 - 1.030   pH 6.0  5.0 - 8.0   Glucose, UA NEGATIVE  NEGATIVE mg/dL   Hgb urine dipstick TRACE (*) NEGATIVE   Bilirubin Urine NEGATIVE  NEGATIVE   Ketones, ur NEGATIVE  NEGATIVE mg/dL   Protein, ur NEGATIVE  NEGATIVE mg/dL   Urobilinogen, UA 0.2  0.0 - 1.0 mg/dL   Nitrite NEGATIVE  NEGATIVE   Leukocytes, UA NEGATIVE  NEGATIVE  URINE MICROSCOPIC-ADD ON     Status: None   Collection Time    03/07/13  5:35 PM      Result Value Range  Squamous Epithelial / LPF RARE  RARE   WBC, UA 0-2  <3 WBC/hpf   RBC / HPF 0-2  <3 RBC/hpf  CBC WITH DIFFERENTIAL     Status: None   Collection Time    03/07/13  5:45 PM      Result Value Range   WBC 9.3  4.0 - 10.5 K/uL   RBC 4.76  4.22 - 5.81 MIL/uL   Hemoglobin 14.3  13.0 - 17.0 g/dL   HCT 16.1  09.6 - 04.5 %   MCV 88.7  78.0 - 100.0 fL   MCH 30.0  26.0 - 34.0 pg   MCHC 33.9  30.0 - 36.0 g/dL   RDW 40.9  81.1 - 91.4 %   Platelets 228  150 - 400 K/uL   Neutrophils Relative % 58  43 - 77 %   Neutro Abs 5.4  1.7 - 7.7 K/uL   Lymphocytes Relative 31  12 - 46 %   Lymphs Abs 2.9  0.7 - 4.0 K/uL   Monocytes Relative 9  3 - 12 %   Monocytes Absolute 0.8  0.1 - 1.0 K/uL   Eosinophils Relative 2  0 - 5 %   Eosinophils Absolute 0.2  0.0 - 0.7 K/uL   Basophils Relative 0  0 - 1 %   Basophils Absolute 0.0  0.0 - 0.1 K/uL  COMPREHENSIVE METABOLIC  PANEL     Status: Abnormal   Collection Time    03/07/13  5:45 PM      Result Value Range   Sodium 137  135 - 145 mEq/L   Potassium 3.7  3.5 - 5.1 mEq/L   Chloride 98  96 - 112 mEq/L   CO2 26  19 - 32 mEq/L   Glucose, Bld 85  70 - 99 mg/dL   BUN 20  6 - 23 mg/dL   Creatinine, Ser 7.82  0.50 - 1.35 mg/dL   Calcium 9.6  8.4 - 95.6 mg/dL   Total Protein 7.4  6.0 - 8.3 g/dL   Albumin 3.2 (*) 3.5 - 5.2 g/dL   AST 38 (*) 0 - 37 U/L   ALT 37  0 - 53 U/L   Alkaline Phosphatase 132 (*) 39 - 117 U/L   Total Bilirubin 0.6  0.3 - 1.2 mg/dL   GFR calc non Af Amer >90  >90 mL/min   GFR calc Af Amer >90  >90 mL/min   Comment: (NOTE)     The eGFR has been calculated using the CKD EPI equation.     This calculation has not been validated in all clinical situations.     eGFR's persistently <90 mL/min signify possible Chronic Kidney     Disease.  LIPID PANEL     Status: Abnormal   Collection Time    03/08/13  5:09 AM      Result Value Range   Cholesterol 154  0 - 200 mg/dL   Triglycerides 213  <086 mg/dL   HDL 28 (*) >57 mg/dL   Total CHOL/HDL Ratio 5.5     VLDL 22  0 - 40 mg/dL   LDL Cholesterol 846 (*) 0 - 99 mg/dL   Comment:            Total Cholesterol/HDL:CHD Risk     Coronary Heart Disease Risk Table                         Men   Women  1/2 Average Risk   3.4   3.3      Average Risk       5.0   4.4      2 X Average Risk   9.6   7.1      3 X Average Risk  23.4   11.0                Use the calculated Patient Ratio     above and the CHD Risk Table     to determine the patient's CHD Risk.                ATP III CLASSIFICATION (LDL):      <100     mg/dL   Optimal      161-096  mg/dL   Near or Above                        Optimal      130-159  mg/dL   Borderline      045-409  mg/dL   High      >811     mg/dL   Very High   Ct Abdomen Pelvis W Contrast  03/07/2013   CLINICAL DATA:  Abdominal and pelvic pain. History of prior hernia repair.  EXAM: CT ABDOMEN AND PELVIS WITH  CONTRAST  TECHNIQUE: Multidetector CT imaging of the abdomen and pelvis was performed using the standard protocol following bolus administration of intravenous contrast.  CONTRAST:  OMNIPAQUE IOHEXOL 300 MG/ML  SOLN  COMPARISON:  Prior CT of the abdomen and pelvis without contrast that [facility] on 05/23/2008.  FINDINGS: There is evidence of diverticulitis involving the sigmoid colon with a segment of inflamed and thickened colon present at the level of the proximal sigmoid. Associated loculated extraluminal air measures approximately 3 centimeters in diameter. Some adjacent small bowel loops show mild wall thickening and are likely secondarily inflamed. Gross free intraperitoneal air is not identified. There is no evidence of associated bowel obstruction. The liver is unremarkable. Stable calcified gallstone is identified in the gallbladder.  The pancreas, spleen, adrenal glands and kidneys are unremarkable. Small nonobstructing calculus is identified in the lower pole of the left kidney. No masses, enlarged lymph nodes or hernias are identified. The bladder is decompressed. No bony abnormalities are identified.  IMPRESSION: Acute diverticulitis involving the proximal sigmoid colon. Extraluminal air is present measuring approximately 3 centimeters in diameter. This does not have the appearance of a liquified abscess.   Electronically Signed   By: Irish Lack   On: 03/07/2013 16:42   US Carotid Duplex Bilateral  03/08/2013   *RADIOLOGY REPORT*  Clinical Data: Carotid bruits, history of endarterectomy.  BILATERAL CAROTID DUPLEX ULTRASOUND  Technique: Wallace Cullens scale imaging, color Doppler and duplex ultrasound were performed of bilateral carotid and vertebral arteries in the neck.  Comparison:  Comparison with outside study [facility] dated 06/23/2011  Criteria:  Quantification of carotid stenosis is based on velocity parameters that correlate the residual internal carotid diameter  with NASCET-based stenosis levels, using the diameter of the distal internal carotid lumen as the denominator for stenosis measurement.  The following velocity measurements were obtained:                   PEAK SYSTOLIC/END DIASTOLIC RIGHT ICA:                        116/30cm/sec CCA:  97/21cm/sec SYSTOLIC ICA/CCA RATIO:     1.20 DIASTOLIC ICA/CCA RATIO:    1.46 ECA:                          125cm/sec  LEFT ICA:                        119/38cm/sec CCA:                        151/22cm/sec SYSTOLIC ICA/CCA RATIO:     0.79 DIASTOLIC ICA/CCA RATIO:    1.71 ECA:                        105cm/sec  Findings:  RIGHT CAROTID ARTERY: Intimal thickening is noted as well as mild atherosclerotic plaque in the region of the carotid bulb extending in the proximal internal carotid artery. No focal hemodynamically significant stenosis is noted.  RIGHT VERTEBRAL ARTERY:  Antegrade in nature.  LEFT CAROTID ARTERY: Intimal thickening is noted as well as mild plaque in the region of the carotid bulb and proximal internal carotid artery. No focal hemodynamically significant stenosis is noted.  LEFT VERTEBRAL ARTERY:  Reversed consistent with the given clinical history of subclavian steal.  IMPRESSION: Bilateral plaque is noted.  No focal hemodynamically significant stenosis is noted bilaterally. The velocities are roughly similar to that seen on the prior exam from 2012.  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   Original Report Authenticated By: Alcide Clever, M.D.   Dg Abd Acute W/chest  03/07/2013   CLINICAL DATA:  Left lower quadrant abdominal pain.  EXAM: ACUTE ABDOMEN SERIES (ABDOMEN 2 VIEW & CHEST 1 VIEW)  COMPARISON:  Multiple exams, including 11/30/2012  FINDINGS: Biapical pleural parenchymal scarring appear similar to prior. Emphysema is present.  Symmetric nodular densities of the lung bases favor nipple shadows.  Right mid lung nodule, stable from 2005, consider benign.  Patient has known nodules in the  left lower lobe, previously characterized as benign and stable.  1.8 cm gallstone in the gallbladder. No free intraperitoneal gas. Gas and stool are present throughout the colon. No dilated bowel noted. No significant abnormal air-fluid levels.  IMPRESSION: 1.  Prominent stool throughout the colon favors constipation. 2. Cholelithiasis. 3. Emphysema with several benign pulmonary nodules.   Electronically Signed   By: Herbie Baltimore   On: 03/07/2013 15:38    Review of Systems  Constitutional: Positive for fever and chills.  HENT: Negative.   Eyes: Negative.   Respiratory: Negative.   Gastrointestinal: Positive for nausea, abdominal pain and diarrhea. Negative for heartburn, vomiting, constipation, blood in stool and melena.  Genitourinary: Negative.   Musculoskeletal: Negative.   Skin: Negative.   Neurological: Negative.   Endo/Heme/Allergies: Negative.   Psychiatric/Behavioral: Negative.     Blood pressure 117/65, pulse 87, temperature 97 F (36.1 C), temperature source Oral, resp. rate 20, height 5\' 10"  (1.778 m), weight 69.6 kg (153 lb 7 oz), SpO2 95.00%. Physical Exam  Constitutional: He is oriented to person, place, and time. He appears well-developed and well-nourished. No distress.  HENT:  Head: Normocephalic and atraumatic.  Eyes: Conjunctivae and EOM are normal. Pupils are equal, round, and reactive to light. No scleral icterus.  Neck: Normal range of motion. No tracheal deviation present.  Cardiovascular: Normal rate, regular rhythm and normal heart sounds.   Respiratory: Effort normal and breath sounds normal. No respiratory distress.  GI: Soft. Bowel sounds are normal. He exhibits no distension and no mass. There is tenderness (moderate suprapubic, no diffuse peritoneal signs). There is no rebound and no guarding.  Musculoskeletal: Normal range of motion.  Lymphadenopathy:    He has no cervical adenopathy.  Neurological: He is alert and oriented to person, place, and time.   Skin: Skin is warm and dry.     Assessment/Plan Acute diverticulitis with microperforation. At this point continue IV fluid hydration. Continue IV antibiotics. As patient's symptoms have slightly improved and his clinical presentation is unremarkable at this point patient will be started on clears and slowly advance diet. Indications for surgical intervention have been discussed at length the patient. Indications for urgent intervention have been discussed as well as the likely surgical course should this occur. Continue to monitor her WBC count. Continue IV antibiotic for least the next 24 hours.  Jamontae Thwaites C 03/08/2013, 12:44 PM

## 2013-03-08 NOTE — Plan of Care (Signed)
Problem: Phase I Progression Outcomes Goal: OOB as tolerated unless otherwise ordered Outcome: Completed/Met Date Met:  03/08/13 Patient ambulating in hallway without difficulty.

## 2013-03-08 NOTE — Progress Notes (Signed)
.   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 We discussed ways for him to pack portable healthy foods while on the road to improve his nutrition status and intake.    Body mass index is 22.02 kg/(m^2). Patient meets criteria for normal based on current BMI.   Current diet order is full liquids, patient is consuming approximately 75% of meals at this time (per pt). Obtained his preferences for meal tonight. Labs and medications reviewed.   No nutrition interventions warranted at this time. If nutrition issues arise, please consult RD.   Royann Shivers MS,RD,LDN,CSG Office: [phone] Pager: [phone]

## 2013-03-08 NOTE — Progress Notes (Signed)
UR Chart Review Completed  

## 2013-03-08 NOTE — Progress Notes (Signed)
[facility] PROGRESS NOTE  CORDALE MANERA WUJ:[id number] DOB: June 17, 1951 DOA: 03/07/2013 PCP: Rudi Heap, MD  Assessment/Plan: 1. Acute diverticulitis with perforation: further care and decisions per primary service. -meanwhile continue NPO -continue IV antibiotics  -PRN antiemetics and analgesics.  2. Hx of CAD:Per cath in 2009, the right coronary artery was occluded proximally. The PDA was large and normal and excellent collateral filling was noted. EF is about 55% with inferior hypokinesis. Medical management was recommended in 2009.  -no CP and no SOB -BP soft to start any IV b-blockers at this moment -will recommend to resume ASA and start low dose b-blocker once able to tolerate PO's -follow 2-D echo  3. Tobacco abuse: cessation counseling provided.  4. Carotid artery stenosis Bilaterally: mild disease and plaques seen bilaterally on his dopplers exam. He is status post R endarterectomy. -LDL is 104; and HDL 28 -has not been able to tolerate statins in the past. -will recommend low fat diet, ASA and fish oil  DVT: SCD's   Code Status: Full Family Communication: no family at bedside Disposition Plan: to be determine by primary service   Procedures:  See below for x-ray reports  Antibiotics:  cipro and flagyl (02/1513)  HPI/Subjective: Patient afebrile, no CP, no nausea, no SOB. Reports no BM since Friday (03/04/13).  Objective: Filed Vitals:   03/08/13 0500  BP: 117/65  Pulse: 87  Temp: 97 F (36.1 C)  Resp: 20   No intake or output data in the 24 hours ending 03/08/13 0908 Filed Weights   03/07/13 1659 03/07/13 2131  Weight: 69.4 kg (153 lb) 69.6 kg (153 lb 7 oz)    Exam:   General:  NAD, afebrile, denies CP, or SOB  Cardiovascular: positive SEM (aortic stenosis suspected by auscultation area); RRR, no rubs or gallops  Respiratory: good air movement, no no wheezing or crackles  Abdomen: soft, no guarding; positive BS; tenderness to  palpation on his LLQ; no distension  Musculoskeletal: no edema, cyanosis or clubbing  Data Reviewed: Basic Metabolic Panel:  Recent Labs Lab 03/07/13 1618 03/07/13 1745  NA  --  137  K  --  3.7  CL  --  98  CO2  --  26  GLUCOSE  --  85  BUN  --  20  CREATININE 1.10 0.86  CALCIUM  --  9.6   Liver Function Tests:  Recent Labs Lab 03/07/13 1745  AST 38*  ALT 37  ALKPHOS 132*  BILITOT 0.6  PROT 7.4  ALBUMIN 3.2*   CBC:  Recent Labs Lab 03/07/13 1149 03/07/13 1745  WBC 9.9 9.3  NEUTROABS  --  5.4  HGB 15.6 14.3  HCT 46.3 42.2  MCV 89.1 88.7  PLT  --  228    Studies: Ct Abdomen Pelvis W Contrast  03/07/2013   CLINICAL DATA:  Abdominal and pelvic pain. History of prior hernia repair.  EXAM: CT ABDOMEN AND PELVIS WITH CONTRAST  TECHNIQUE: Multidetector CT imaging of the abdomen and pelvis was performed using the standard protocol following bolus administration of intravenous contrast.  CONTRAST:  OMNIPAQUE IOHEXOL 300 MG/ML  SOLN  COMPARISON:  Prior CT of the abdomen and pelvis without contrast that [facility] on 05/23/2008.  FINDINGS: There is evidence of diverticulitis involving the sigmoid colon with a segment of inflamed and thickened colon present at the level of the proximal sigmoid. Associated loculated extraluminal air measures approximately 3 centimeters in diameter. Some adjacent small bowel loops show mild wall thickening  and are likely secondarily inflamed. Gross free intraperitoneal air is not identified. There is no evidence of associated bowel obstruction. The liver is unremarkable. Stable calcified gallstone is identified in the gallbladder.  The pancreas, spleen, adrenal glands and kidneys are unremarkable. Small nonobstructing calculus is identified in the lower pole of the left kidney. No masses, enlarged lymph nodes or hernias are identified. The bladder is decompressed. No bony abnormalities are identified.  IMPRESSION: Acute  diverticulitis involving the proximal sigmoid colon. Extraluminal air is present measuring approximately 3 centimeters in diameter. This does not have the appearance of a liquified abscess.   Electronically Signed   By: Irish Lack   On: 03/07/2013 16:42   US Carotid Duplex Bilateral  03/08/2013   *RADIOLOGY REPORT*  Clinical Data: Carotid bruits, history of endarterectomy.  BILATERAL CAROTID DUPLEX ULTRASOUND  Technique: Wallace Cullens scale imaging, color Doppler and duplex ultrasound were performed of bilateral carotid and vertebral arteries in the neck.  Comparison:  Comparison with outside study [facility] dated 06/23/2011  Criteria:  Quantification of carotid stenosis is based on velocity parameters that correlate the residual internal carotid diameter with NASCET-based stenosis levels, using the diameter of the distal internal carotid lumen as the denominator for stenosis measurement.  The following velocity measurements were obtained:                   PEAK SYSTOLIC/END DIASTOLIC RIGHT ICA:                        116/30cm/sec CCA:                        97/21cm/sec SYSTOLIC ICA/CCA RATIO:     1.20 DIASTOLIC ICA/CCA RATIO:    1.46 ECA:                          125cm/sec  LEFT ICA:                        119/38cm/sec CCA:                        151/22cm/sec SYSTOLIC ICA/CCA RATIO:     0.79 DIASTOLIC ICA/CCA RATIO:    1.71 ECA:                        105cm/sec  Findings:  RIGHT CAROTID ARTERY: Intimal thickening is noted as well as mild atherosclerotic plaque in the region of the carotid bulb extending in the proximal internal carotid artery. No focal hemodynamically significant stenosis is noted.  RIGHT VERTEBRAL ARTERY:  Antegrade in nature.  LEFT CAROTID ARTERY: Intimal thickening is noted as well as mild plaque in the region of the carotid bulb and proximal internal carotid artery. No focal hemodynamically significant stenosis is noted.  LEFT VERTEBRAL ARTERY:  Reversed consistent with the given  clinical history of subclavian steal.  IMPRESSION: Bilateral plaque is noted.  No focal hemodynamically significant stenosis is noted bilaterally. The velocities are roughly similar to that seen on the prior exam from 2012.  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   Original Report Authenticated By: Alcide Clever, M.D.   Dg Abd Acute W/chest  03/07/2013   CLINICAL DATA:  Left lower quadrant abdominal pain.  EXAM: ACUTE ABDOMEN SERIES (ABDOMEN 2 VIEW & CHEST 1 VIEW)  COMPARISON:  Multiple exams, including  11/30/2012  FINDINGS: Biapical pleural parenchymal scarring appear similar to prior. Emphysema is present.  Symmetric nodular densities of the lung bases favor nipple shadows.  Right mid lung nodule, stable from 2005, consider benign.  Patient has known nodules in the left lower lobe, previously characterized as benign and stable.  1.8 cm gallstone in the gallbladder. No free intraperitoneal gas. Gas and stool are present throughout the colon. No dilated bowel noted. No significant abnormal air-fluid levels.  IMPRESSION: 1.  Prominent stool throughout the colon favors constipation. 2. Cholelithiasis. 3. Emphysema with several benign pulmonary nodules.   Electronically Signed   By: Herbie Baltimore   On: 03/07/2013 15:38    Scheduled Meds: . ciprofloxacin  400 mg Intravenous Q12H  . metronidazole  500 mg Intravenous Q8H  . pantoprazole (PROTONIX) IV  40 mg Intravenous Q24H   Continuous Infusions:   Principal Problem:   Acute diverticulitis Active Problems:   TOBACCO ABUSE   CAD   Occlusion and stenosis of carotid artery without mention of cerebral infarction    Time spent: >30 minutes    Geselle Cardosa  [facility] Pager [phone]. If 7PM-7AM, please contact night-coverage at [email or web address], password [facility] 03/08/2013, 9:08 AM  LOS: 1 day

## 2013-03-09 DIAGNOSIS — I059 Rheumatic mitral valve disease, unspecified: Secondary | ICD-10-CM

## 2013-03-09 LAB — BASIC METABOLIC PANEL
BUN: 13 mg/dL (ref 6–23)
CO2: 26 mEq/L (ref 19–32)
Chloride: 104 mEq/L (ref 96–112)
GFR calc non Af Amer: 86 mL/min — ABNORMAL LOW (ref >90)
Glucose, Bld: 121 mg/dL — ABNORMAL HIGH (ref 70–99)
Potassium: 3.6 mEq/L (ref 3.5–5.1)
Sodium: 138 mEq/L (ref 135–145)

## 2013-03-09 LAB — CBC
HCT: 39.5 % (ref 39.0–52.0)
Hemoglobin: 13.7 g/dL (ref 13.0–17.0)
RBC: 4.5 MIL/uL (ref 4.22–5.81)

## 2013-03-09 MED ORDER — CIPROFLOXACIN HCL 250 MG PO TABS
500.0000 mg | ORAL_TABLET | Freq: Two times a day (BID) | ORAL | Status: DC
  Administered 2013-03-09: 500 mg via ORAL
  Filled 2013-03-09: qty 2

## 2013-03-09 MED ORDER — CIPROFLOXACIN HCL 500 MG PO TABS: 500.0000 mg | ORAL_TABLET | Freq: Two times a day (BID) | ORAL | Status: DC

## 2013-03-09 MED ORDER — METRONIDAZOLE 500 MG PO TABS: 500.0000 mg | ORAL_TABLET | Freq: Three times a day (TID) | ORAL | Status: DC

## 2013-03-09 MED ORDER — METRONIDAZOLE 500 MG PO TABS
500.0000 mg | ORAL_TABLET | Freq: Three times a day (TID) | ORAL | Status: DC
  Administered 2013-03-09: 500 mg via ORAL
  Filled 2013-03-09: qty 1

## 2013-03-09 NOTE — Progress Notes (Signed)
[facility] PROGRESS NOTE  [name] [name] ZOX:[id number] DOB: 02-14-1951 DOA: 03/07/2013 PCP: Rudi Heap, MD  Assessment/Plan: 1. Acute diverticulitis with perforation: further care and decisions per primary service. Tolerating full liq.  -PRN antiemetics and analgesics.  2. Hx of CAD:Per cath in 2009, the right coronary artery was occluded proximally. The PDA was large and normal and excellent collateral filling was noted. EF is about 55% with inferior hypokinesis. Medical management was recommended in 2009.  -no CP and no SOB -BP soft to start any IV b-blockers at this moment -will recommend to resume ASA and start low dose b-blocker once able to tolerate PO's   3. Tobacco abuse: cessation counseling provided.  4. Carotid artery stenosis Bilaterally: mild disease and plaques seen bilaterally on his dopplers exam. He is status post R endarterectomy. -LDL is 104; and HDL 28 -has not been able to tolerate statins in the past. -will recommend low fat diet, ASA and fish oil  DVT: SCD's   Code Status: Full Family Communication: no family at bedside Disposition Plan: to be determine by primary service   Procedures:  See below for x-ray reports  Antibiotics:  cipro and flagyl (02/1513)  HPI/Subjective: Patient afebrile, no CP, no nausea, no SOB. Reports no BM since Friday (03/04/13).  Objective: Filed Vitals:   03/09/13 0704  BP: 136/72  Pulse: 86  Temp: 98.4 F (36.9 C)  Resp: 18    Intake/Output Summary (Last 24 hours) at 03/09/13 1322 Last data filed at 03/09/13 0900  Gross per 24 hour  Intake 1115.83 ml  Output      0 ml  Net 1115.83 ml   Filed Weights   03/07/13 1659 03/07/13 2131  Weight: 69.4 kg (153 lb) 69.6 kg (153 lb 7 oz)    Exam:   General:  NAD, afebrile, denies CP, or SOB  Cardiovascular: positive SEM (aortic stenosis suspected by auscultation area); RRR, no rubs or gallops  Respiratory: good air movement, no no wheezing or  crackles  Abdomen: soft, no guarding; positive BS; tenderness to palpation on his LLQ; no distension  Musculoskeletal: no edema, cyanosis or clubbing  Data Reviewed: Basic Metabolic Panel:  Recent Labs Lab 03/07/13 1618 03/07/13 1745 03/09/13 0519  NA  --  137 138  K  --  3.7 3.6  CL  --  98 104  CO2  --  26 26  GLUCOSE  --  85 121*  BUN  --  20 13  CREATININE 1.10 0.86 0.99  CALCIUM  --  9.6 9.2   Liver Function Tests:  Recent Labs Lab 03/07/13 1745  AST 38*  ALT 37  ALKPHOS 132*  BILITOT 0.6  PROT 7.4  ALBUMIN 3.2*   CBC:  Recent Labs Lab 03/07/13 1149 03/07/13 1745 03/09/13 0519  WBC 9.9 9.3 11.2*  NEUTROABS  --  5.4  --   HGB 15.6 14.3 13.7  HCT 46.3 42.2 39.5  MCV 89.1 88.7 87.8  PLT  --  228 243    Studies: Ct Abdomen Pelvis W Contrast  03/07/2013   CLINICAL DATA:  Abdominal and pelvic pain. History of prior hernia repair.  EXAM: CT ABDOMEN AND PELVIS WITH CONTRAST  TECHNIQUE: Multidetector CT imaging of the abdomen and pelvis was performed using the standard protocol following bolus administration of intravenous contrast.  CONTRAST:  OMNIPAQUE IOHEXOL 300 MG/ML  SOLN  COMPARISON:  Prior CT of the abdomen and pelvis without contrast that [facility] on 05/23/2008.  FINDINGS: There is  evidence of diverticulitis involving the sigmoid colon with a segment of inflamed and thickened colon present at the level of the proximal sigmoid. Associated loculated extraluminal air measures approximately 3 centimeters in diameter. Some adjacent small bowel loops show mild wall thickening and are likely secondarily inflamed. Gross free intraperitoneal air is not identified. There is no evidence of associated bowel obstruction. The liver is unremarkable. Stable calcified gallstone is identified in the gallbladder.  The pancreas, spleen, adrenal glands and kidneys are unremarkable. Small nonobstructing calculus is identified in the lower pole of the left  kidney. No masses, enlarged lymph nodes or hernias are identified. The bladder is decompressed. No bony abnormalities are identified.  IMPRESSION: Acute diverticulitis involving the proximal sigmoid colon. Extraluminal air is present measuring approximately 3 centimeters in diameter. This does not have the appearance of a liquified abscess.   Electronically Signed   By: Irish Lack   On: 03/07/2013 16:42   US Carotid Duplex Bilateral  03/08/2013   *RADIOLOGY REPORT*  Clinical Data: Carotid bruits, history of endarterectomy.  BILATERAL CAROTID DUPLEX ULTRASOUND  Technique: Wallace Cullens scale imaging, color Doppler and duplex ultrasound were performed of bilateral carotid and vertebral arteries in the neck.  Comparison:  Comparison with outside study [facility] dated 06/23/2011  Criteria:  Quantification of carotid stenosis is based on velocity parameters that correlate the residual internal carotid diameter with NASCET-based stenosis levels, using the diameter of the distal internal carotid lumen as the denominator for stenosis measurement.  The following velocity measurements were obtained:                   PEAK SYSTOLIC/END DIASTOLIC RIGHT ICA:                        116/30cm/sec CCA:                        97/21cm/sec SYSTOLIC ICA/CCA RATIO:     1.20 DIASTOLIC ICA/CCA RATIO:    1.46 ECA:                          125cm/sec  LEFT ICA:                        119/38cm/sec CCA:                        151/22cm/sec SYSTOLIC ICA/CCA RATIO:     0.79 DIASTOLIC ICA/CCA RATIO:    1.71 ECA:                        105cm/sec  Findings:  RIGHT CAROTID ARTERY: Intimal thickening is noted as well as mild atherosclerotic plaque in the region of the carotid bulb extending in the proximal internal carotid artery. No focal hemodynamically significant stenosis is noted.  RIGHT VERTEBRAL ARTERY:  Antegrade in nature.  LEFT CAROTID ARTERY: Intimal thickening is noted as well as mild plaque in the region of the carotid bulb and  proximal internal carotid artery. No focal hemodynamically significant stenosis is noted.  LEFT VERTEBRAL ARTERY:  Reversed consistent with the given clinical history of subclavian steal.  IMPRESSION: Bilateral plaque is noted.  No focal hemodynamically significant stenosis is noted bilaterally. The velocities are roughly similar to that seen on the prior exam from 2012.  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   Original  Report Authenticated By: Alcide Clever, M.D.   Dg Abd Acute W/chest  03/07/2013   CLINICAL DATA:  Left lower quadrant abdominal pain.  EXAM: ACUTE ABDOMEN SERIES (ABDOMEN 2 VIEW & CHEST 1 VIEW)  COMPARISON:  Multiple exams, including 11/30/2012  FINDINGS: Biapical pleural parenchymal scarring appear similar to prior. Emphysema is present.  Symmetric nodular densities of the lung bases favor nipple shadows.  Right mid lung nodule, stable from 2005, consider benign.  Patient has known nodules in the left lower lobe, previously characterized as benign and stable.  1.8 cm gallstone in the gallbladder. No free intraperitoneal gas. Gas and stool are present throughout the colon. No dilated bowel noted. No significant abnormal air-fluid levels.  IMPRESSION: 1.  Prominent stool throughout the colon favors constipation. 2. Cholelithiasis. 3. Emphysema with several benign pulmonary nodules.   Electronically Signed   By: Herbie Baltimore   On: 03/07/2013 15:38    Scheduled Meds: . ciprofloxacin  500 mg Oral BID  . metroNIDAZOLE  500 mg Oral Q8H  . pantoprazole (PROTONIX) IV  40 mg Intravenous Q24H   Continuous Infusions: . dextrose 5 % and 0.45% NaCl 1,000 mL infusion 50 mL/hr at 03/08/13 1144    Principal Problem:   Acute diverticulitis Active Problems:   TOBACCO ABUSE   CAD   Occlusion and stenosis of carotid artery without mention of cerebral infarction    Time spent: 25 minutes    Seth Higginbotham  [facility] Pager [phone]. If 7PM-7AM, please contact night-coverage at  [email or web address], password [facility] 03/09/2013, 1:22 PM  LOS: 2 days

## 2013-03-09 NOTE — Progress Notes (Signed)
IV removed, site WNL.  Pt given d/c instructions, new prescriptions called into phamacy.  Discussed home care with patient and discussed home medications, patient verbalizes understanding, teachback completed. F/U appointment in place, pt states they will keep appointment. Pt is stable at this time. Pt refused wheelchair, ambulated to main entrance, escorted by staff.

## 2013-03-09 NOTE — Progress Notes (Signed)
*  PRELIMINARY RESULTS* Echocardiogram 2D Echocardiogram has been performed.  [name] [name] 03/09/2013, 9:18 AM

## 2013-03-09 NOTE — Progress Notes (Signed)
Subjective: No pain. Tolerating full liquids. No nausea. No fevers or chills. Positive bowel movement.  Objective: Vital signs in last 24 hours: Temp:  [98.1 F (36.7 C)-98.8 F (37.1 C)] 98.4 F (36.9 C) (09/17 0704) Pulse Rate:  [86-91] 86 (09/17 0704) Resp:  [18-20] 18 (09/17 0704) BP: (92-136)/(50-72) 136/72 mmHg (09/17 0704) SpO2:  [97 %-99 %] 98 % (09/17 0704) Last BM Date: 03/09/13  Intake/Output from previous day: 09/16 0701 - 09/17 0700 In: 1415.8 [P.O.:840; I.V.:275.8; IV Piggyback:300] Out: -  Intake/Output this shift:    General appearance: alert and no distress GI: soft, non-tender; bowel sounds normal; no masses,  no organomegaly  Lab Results:   Recent Labs  03/07/13 1745 03/09/13 0519  WBC 9.3 11.2*  HGB 14.3 13.7  HCT 42.2 39.5  PLT 228 243   BMET  Recent Labs  03/07/13 1745 03/09/13 0519  NA 137 138  K 3.7 3.6  CL 98 104  CO2 26 26  GLUCOSE 85 121*  BUN 20 13  CREATININE 0.86 0.99  CALCIUM 9.6 9.2   PT/INR No results found for this basename: LABPROT, INR,  in the last 72 hours ABG No results found for this basename: PHART, PCO2, PO2, HCO3,  in the last 72 hours  Studies/Results: Ct Abdomen Pelvis W Contrast  03/07/2013   CLINICAL DATA:  Abdominal and pelvic pain. History of prior hernia repair.  EXAM: CT ABDOMEN AND PELVIS WITH CONTRAST  TECHNIQUE: Multidetector CT imaging of the abdomen and pelvis was performed using the standard protocol following bolus administration of intravenous contrast.  CONTRAST:  OMNIPAQUE IOHEXOL 300 MG/ML  SOLN  COMPARISON:  Prior CT of the abdomen and pelvis without contrast that [facility] on 05/23/2008.  FINDINGS: There is evidence of diverticulitis involving the sigmoid colon with a segment of inflamed and thickened colon present at the level of the proximal sigmoid. Associated loculated extraluminal air measures approximately 3 centimeters in diameter. Some adjacent small bowel loops  show mild wall thickening and are likely secondarily inflamed. Gross free intraperitoneal air is not identified. There is no evidence of associated bowel obstruction. The liver is unremarkable. Stable calcified gallstone is identified in the gallbladder.  The pancreas, spleen, adrenal glands and kidneys are unremarkable. Small nonobstructing calculus is identified in the lower pole of the left kidney. No masses, enlarged lymph nodes or hernias are identified. The bladder is decompressed. No bony abnormalities are identified.  IMPRESSION: Acute diverticulitis involving the proximal sigmoid colon. Extraluminal air is present measuring approximately 3 centimeters in diameter. This does not have the appearance of a liquified abscess.   Electronically Signed   By: Irish Lack   On: 03/07/2013 16:42   US Carotid Duplex Bilateral  03/08/2013   *RADIOLOGY REPORT*  Clinical Data: Carotid bruits, history of endarterectomy.  BILATERAL CAROTID DUPLEX ULTRASOUND  Technique: Wallace Cullens scale imaging, color Doppler and duplex ultrasound were performed of bilateral carotid and vertebral arteries in the neck.  Comparison:  Comparison with outside study [facility] dated 06/23/2011  Criteria:  Quantification of carotid stenosis is based on velocity parameters that correlate the residual internal carotid diameter with NASCET-based stenosis levels, using the diameter of the distal internal carotid lumen as the denominator for stenosis measurement.  The following velocity measurements were obtained:                   PEAK SYSTOLIC/END DIASTOLIC RIGHT ICA:  116/30cm/sec CCA:                        97/21cm/sec SYSTOLIC ICA/CCA RATIO:     1.20 DIASTOLIC ICA/CCA RATIO:    1.46 ECA:                          125cm/sec  LEFT ICA:                        119/38cm/sec CCA:                        151/22cm/sec SYSTOLIC ICA/CCA RATIO:     0.79 DIASTOLIC ICA/CCA RATIO:    1.71 ECA:                        105cm/sec   Findings:  RIGHT CAROTID ARTERY: Intimal thickening is noted as well as mild atherosclerotic plaque in the region of the carotid bulb extending in the proximal internal carotid artery. No focal hemodynamically significant stenosis is noted.  RIGHT VERTEBRAL ARTERY:  Antegrade in nature.  LEFT CAROTID ARTERY: Intimal thickening is noted as well as mild plaque in the region of the carotid bulb and proximal internal carotid artery. No focal hemodynamically significant stenosis is noted.  LEFT VERTEBRAL ARTERY:  Reversed consistent with the given clinical history of subclavian steal.  IMPRESSION: Bilateral plaque is noted.  No focal hemodynamically significant stenosis is noted bilaterally. The velocities are roughly similar to that seen on the prior exam from 2012.  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   Original Report Authenticated By: Alcide Clever, M.D.   Dg Abd Acute W/chest  03/07/2013   CLINICAL DATA:  Left lower quadrant abdominal pain.  EXAM: ACUTE ABDOMEN SERIES (ABDOMEN 2 VIEW & CHEST 1 VIEW)  COMPARISON:  Multiple exams, including 11/30/2012  FINDINGS: Biapical pleural parenchymal scarring appear similar to prior. Emphysema is present.  Symmetric nodular densities of the lung bases favor nipple shadows.  Right mid lung nodule, stable from 2005, consider benign.  Patient has known nodules in the left lower lobe, previously characterized as benign and stable.  1.8 cm gallstone in the gallbladder. No free intraperitoneal gas. Gas and stool are present throughout the colon. No dilated bowel noted. No significant abnormal air-fluid levels.  IMPRESSION: 1.  Prominent stool throughout the colon favors constipation. 2. Cholelithiasis. 3. Emphysema with several benign pulmonary nodules.   Electronically Signed   By: Herbie Baltimore   On: 03/07/2013 15:38    Anti-infectives: Anti-infectives   Start     Dose/Rate Route Frequency Ordered Stop   03/09/13 1400  metroNIDAZOLE (FLAGYL) tablet 500 mg     500 mg  Oral 3 times per day 03/09/13 1025 03/19/13 1359   03/09/13 1030  ciprofloxacin (CIPRO) tablet 500 mg     500 mg Oral 2 times daily 03/09/13 1025 03/19/13 0759   03/08/13 1100  metroNIDAZOLE (FLAGYL) IVPB 500 mg  Status:  Discontinued     500 mg 100 mL/hr over 60 Minutes Intravenous Every 8 hours 03/08/13 0907 03/09/13 1025   03/08/13 1000  ciprofloxacin (CIPRO) IVPB 400 mg  Status:  Discontinued     400 mg 200 mL/hr over 60 Minutes Intravenous Every 12 hours 03/08/13 0907 03/09/13 1025   03/07/13 1730  ciprofloxacin (CIPRO) IVPB 400 mg     400 mg 200 mL/hr over  60 Minutes Intravenous  Once 03/07/13 1726 03/07/13 2014   03/07/13 1730  metroNIDAZOLE (FLAGYL) IVPB 500 mg     500 mg 100 mL/hr over 60 Minutes Intravenous  Once 03/07/13 1726 03/07/13 1913      Assessment/Plan: s/p * No surgery found * Acute diverticulitis. We'll slowly advance diet to regular diet this afternoon. Continue to increase activity. Transition to oral antibiotics. Likely discharge later today or proceed tomorrow morning  LOS: 2 days    Gor Vestal C 03/09/2013

## 2013-03-17 NOTE — Discharge Summary (Signed)
Physician Discharge Summary  Patient ID: [name] [name] MRN: [id number] DOB/AGE: [birth year] 62 y.o.  Admit date: 03/07/2013 Discharge date: 03/09/2013  Admission Diagnoses: Acute diverticulitis  Discharge Diagnoses: the same Principal Problem:   Acute diverticulitis Active Problems:   TOBACCO ABUSE   CAD   Occlusion and stenosis of carotid artery without mention of cerebral infarction   Discharged Condition: stable  Hospital Course: Patient presented with abdominal pain.  Work-up consistent with diverticulitis.  Admitted for abx coverage.  Continued to demonstrate improvement.    Consults: None  Significant Diagnostic Studies: labs: daily  Treatments: IV hydration and antibiotics: Cipro and metronidazole  Discharge Exam: Blood pressure 128/73, pulse 93, temperature 98.1 F (36.7 C), temperature source Oral, resp. rate 18, height 5\' 10"  (1.778 m), weight 69.6 kg (153 lb 7 oz), SpO2 98.00%. General appearance: alert and no distress Resp: clear to auscultation bilaterally Cardio: regular rate and rhythm GI: soft, non-tender; bowel sounds normal; no masses,  no organomegaly  Disposition: 01-Home or Self Care  Discharge Orders   Future Orders Complete By Expires   Diet - low sodium heart healthy  As directed    Discharge instructions  As directed    Comments:     Increase activity as tolerated. May place ice pack for comfort.  Increase activity as tolerated. May place ice pack for comfort.   Increase activity slowly  As directed        Medication List         aspirin EC 81 MG tablet  Take 81 mg by mouth daily.     ciprofloxacin 500 MG tablet  Commonly known as:  CIPRO  Take 1 tablet (500 mg total) by mouth 2 (two) times daily.     fish oil-omega-3 fatty acids 1000 MG capsule  Take 2 g by mouth daily.     metroNIDAZOLE 500 MG tablet  Commonly known as:  FLAGYL  Take 1 tablet (500 mg total) by mouth every 8 (eight) hours.     VITAMIN D-3 PO  Take by  mouth.           Follow-up Information   Follow up with Fabio Bering, MD On 03/17/2013.   Specialty:  General Surgery   Contact information:   [address] [place] [address] [phone]       Signed: Fabio Bering 03/17/2013, 12:48 AM

## 2013-06-23 HISTORY — PX: EXTRACORPOREAL SHOCK WAVE LITHOTRIPSY: SHX1557

## 2013-07-21 ENCOUNTER — Telehealth: Payer: Self-pay | Admitting: Family Medicine

## 2013-07-22 ENCOUNTER — Ambulatory Visit (INDEPENDENT_AMBULATORY_CARE_PROVIDER_SITE_OTHER): Payer: BC Managed Care – PPO | Admitting: Nurse Practitioner

## 2013-07-22 VITALS — BP 111/72 | HR 77 | Temp 96.6°F | Ht 70.0 in | Wt 153.0 lb

## 2013-07-22 DIAGNOSIS — K5732 Diverticulitis of large intestine without perforation or abscess without bleeding: Secondary | ICD-10-CM

## 2013-07-22 MED ORDER — CIPROFLOXACIN HCL 500 MG PO TABS
500.0000 mg | ORAL_TABLET | Freq: Two times a day (BID) | ORAL | Status: DC
Start: 2013-07-22 — End: 2014-03-06

## 2013-07-22 MED ORDER — METRONIDAZOLE 500 MG PO TABS: 500.0000 mg | ORAL_TABLET | Freq: Three times a day (TID) | ORAL | Status: DC

## 2013-07-22 NOTE — Telephone Encounter (Signed)
Patient walked in to office today and was seen

## 2013-07-22 NOTE — Patient Instructions (Signed)
Diverticulitis °A diverticulum is a small pouch or sac on the colon. Diverticulosis is the presence of these diverticula on the colon. Diverticulitis is the irritation (inflammation) or infection of diverticula. °CAUSES  °The colon and its diverticula contain bacteria. If food particles block the tiny opening to a diverticulum, the bacteria inside can grow and cause an increase in pressure. This leads to infection and inflammation and is called diverticulitis. °SYMPTOMS  °· Abdominal pain and tenderness. Usually, the pain is located on the left side of your abdomen. However, it could be located elsewhere. °· Fever. °· Bloating. °· Feeling sick to your stomach (nausea). °· Throwing up (vomiting). °· Abnormal stools. °DIAGNOSIS  °Your caregiver will take a history and perform a physical exam. Since many things can cause abdominal pain, other tests may be necessary. Tests may include: °· Blood tests. °· Urine tests. °· X-ray of the abdomen. °· CT scan of the abdomen. °Sometimes, surgery is needed to determine if diverticulitis or other conditions are causing your symptoms. °TREATMENT  °Most of the time, you can be treated without surgery. Treatment includes: °· Resting the bowels by only having liquids for a few days. As you improve, you will need to eat a low-fiber diet. °· Intravenous (IV) fluids if you are losing body fluids (dehydrated). °· Antibiotic medicines that treat infections may be given. °· Pain and nausea medicine, if needed. °· Surgery if the inflamed diverticulum has burst. °HOME CARE INSTRUCTIONS  °· Try a clear liquid diet (broth, tea, or water for as long as directed by your caregiver). You may then gradually begin a low-fiber diet as tolerated.  °A low-fiber diet is a diet with less than 10 grams of fiber. Choose the foods below to reduce fiber in the diet: °· White breads, cereals, rice, and pasta. °· Cooked fruits and vegetables or soft fresh fruits and vegetables without the skin. °· Ground or  well-cooked tender beef, ham, veal, lamb, pork, or poultry. °· Eggs and seafood. °· After your diverticulitis symptoms have improved, your caregiver may put you on a high-fiber diet. A high-fiber diet includes 14 grams of fiber for every 1000 calories consumed. For a standard 2000 calorie diet, you would need 28 grams of fiber. Follow these diet guidelines to help you increase the fiber in your diet. It is important to slowly increase the amount fiber in your diet to avoid gas, constipation, and bloating. °· Choose whole-grain breads, cereals, pasta, and brown rice. °· Choose fresh fruits and vegetables with the skin on. Do not overcook vegetables because the more vegetables are cooked, the more fiber is lost. °· Choose more nuts, seeds, legumes, dried peas, beans, and lentils. °· Look for food products that have greater than 3 grams of fiber per serving on the Nutrition Facts label. °· Take all medicine as directed by your caregiver. °· If your caregiver has given you a follow-up appointment, it is very important that you go. Not going could result in lasting (chronic) or permanent injury, pain, and disability. If there is any problem keeping the appointment, call to reschedule. °SEEK MEDICAL CARE IF:  °· Your pain does not improve. °· You have a hard time advancing your diet beyond clear liquids. °· Your bowel movements do not return to normal. °SEEK IMMEDIATE MEDICAL CARE IF:  °· Your pain becomes worse. °· You have an oral temperature above 102° F (38.9° C), not controlled by medicine. °· You have repeated vomiting. °· You have bloody or black, tarry stools. °·   Symptoms that brought you to your caregiver become worse or are not getting better. °MAKE SURE YOU:  °· Understand these instructions. °· Will watch your condition. °· Will get help right away if you are not doing well or get worse. °Document Released: 03/19/2005 Document Revised: 09/01/2011 Document Reviewed: 07/15/2010 °[employer]® Patient Information  ©2014 [employer]. ° °

## 2013-07-22 NOTE — Progress Notes (Signed)
   Subjective:    Patient ID: [name] [name], male    DOB: 1950-06-28, 63 y.o.   MRN: [id number]  HPI Patient her today with c/o left lower quadrant pain- has history of diverticulitis- pain is the same a past flare ups- said he ate a cheese burger 2 days ago and that is when it started.    Review of Systems  Constitutional: Negative.   HENT: Negative.   Respiratory: Negative.   Cardiovascular: Negative.   Gastrointestinal: Positive for abdominal pain. Negative for diarrhea and constipation.  Genitourinary: Negative.   Musculoskeletal: Negative.   Neurological: Negative.   All other systems reviewed and are negative.       Objective:   Physical Exam  Constitutional: He appears well-developed and well-nourished.  Cardiovascular: Normal rate, regular rhythm and normal heart sounds.   Pulmonary/Chest: Effort normal and breath sounds normal.  Abdominal: Soft. Bowel sounds are normal. There is tenderness (eft upper and lower quadrant).  Skin: Skin is warm and dry.  Psychiatric: He has a normal mood and affect. His behavior is normal. Judgment and thought content normal.   BP 111/72  Pulse 77  Temp(Src) 96.6 F (35.9 C) (Oral)  Ht 5\' 10"  (1.778 m)  Wt 153 lb (69.4 kg)  BMI 21.95 kg/m2        Assessment & Plan:   1. Diverticulitis of colon (without mention of hemorrhage)    Meds ordered this encounter  Medications  . ciprofloxacin (CIPRO) 500 MG tablet    Sig: Take 1 tablet (500 mg total) by mouth 2 (two) times daily.    Dispense:  14 tablet    Refill:  0    Order Specific Question:  Supervising Provider    Answer:  Chipper Herb [[id number]]  . metroNIDAZOLE (FLAGYL) 500 MG tablet    Sig: Take 1 tablet (500 mg total) by mouth every 8 (eight) hours.    Dispense:  21 tablet    Refill:  0    Order Specific Question:  Supervising Provider    Answer:  Chipper Herb [[id number]]   Watch diet  RTO prn  [name]-Margaret Hassell Done, FNP

## 2013-09-02 ENCOUNTER — Ambulatory Visit (INDEPENDENT_AMBULATORY_CARE_PROVIDER_SITE_OTHER): Payer: BC Managed Care – PPO | Admitting: Physician Assistant

## 2013-09-02 ENCOUNTER — Encounter (INDEPENDENT_AMBULATORY_CARE_PROVIDER_SITE_OTHER): Payer: Self-pay

## 2013-09-02 VITALS — BP 125/72 | HR 83 | Temp 96.6°F | Ht 70.0 in | Wt 155.0 lb

## 2013-09-02 DIAGNOSIS — N3090 Cystitis, unspecified without hematuria: Secondary | ICD-10-CM

## 2013-09-02 DIAGNOSIS — R350 Frequency of micturition: Secondary | ICD-10-CM

## 2013-09-02 LAB — POCT UA - MICROSCOPIC ONLY
Bacteria, U Microscopic: NEGATIVE
CASTS, UR, LPF, POC: NEGATIVE
Crystals, Ur, HPF, POC: NEGATIVE
MUCUS UA: NEGATIVE
RBC, urine, microscopic: NEGATIVE
YEAST UA: NEGATIVE

## 2013-09-02 LAB — POCT URINALYSIS DIPSTICK
Bilirubin, UA: NEGATIVE
Blood, UA: NEGATIVE
Glucose, UA: NEGATIVE
KETONES UA: NEGATIVE
Nitrite, UA: NEGATIVE
PH UA: 6
PROTEIN UA: NEGATIVE
SPEC GRAV UA: 1.01
Urobilinogen, UA: NEGATIVE

## 2013-09-02 MED ORDER — NITROFURANTOIN MONOHYD MACRO 100 MG PO CAPS: 100.0000 mg | ORAL_CAPSULE | Freq: Two times a day (BID) | ORAL | Status: DC

## 2013-09-02 NOTE — Progress Notes (Signed)
   Subjective:    Patient ID: [name] [name], male    DOB: 1950-09-23, 63 y.o.   MRN: [id number]  HPI 63 y/o male presents with c/o urinary frequency and burning with urination x 2 days. No aggravating or relieving factors.     Review of Systems  Constitutional: Negative.   Respiratory: Negative.   Cardiovascular: Negative.   Gastrointestinal: Negative.   Genitourinary: Positive for dysuria (burning), urgency and frequency. Negative for hematuria, discharge and difficulty urinating.  Musculoskeletal: Negative for back pain.       Objective:   Physical Exam  Nursing note and vitals reviewed. Constitutional: He is oriented to person, place, and time. He appears well-developed and well-nourished. No distress.  Pulmonary/Chest: Effort normal. No respiratory distress.  Abdominal: Soft. Bowel sounds are normal. He exhibits no distension and no mass. There is no tenderness. There is no rebound and no guarding.  Negative for CVA TTP  Neurological: He is alert and oriented to person, place, and time.  Skin: He is not diaphoretic.  Psychiatric: He has a normal mood and affect. His behavior is normal.          Assessment & Plan:  1. Cystitis: U/A positive for leukocytosis 2+: Will tx w/ Macrobid 100mg  BID x 5 days. Drink plenty of fluids. Have patient RTC in 2 wks for reassessment.

## 2013-09-02 NOTE — Patient Instructions (Signed)
Take all of medication as prescribed. Drink plenty of noncaffienated fluids. RTC if s/s worsen or do not imp[rove.

## 2014-02-21 NOTE — Addendum Note (Signed)
Addended by: Chevis Pretty on: 02/21/2014 11:50 AM   Modules accepted: Level of Service

## 2014-03-06 ENCOUNTER — Ambulatory Visit (INDEPENDENT_AMBULATORY_CARE_PROVIDER_SITE_OTHER): Payer: BC Managed Care – PPO | Admitting: Family Medicine

## 2014-03-06 VITALS — BP 137/80 | HR 74 | Temp 97.1°F

## 2014-03-06 DIAGNOSIS — R5381 Other malaise: Secondary | ICD-10-CM

## 2014-03-06 DIAGNOSIS — Z0000 Encounter for general adult medical examination without abnormal findings: Secondary | ICD-10-CM

## 2014-03-06 DIAGNOSIS — E785 Hyperlipidemia, unspecified: Secondary | ICD-10-CM

## 2014-03-06 DIAGNOSIS — K589 Irritable bowel syndrome without diarrhea: Secondary | ICD-10-CM

## 2014-03-06 DIAGNOSIS — R350 Frequency of micturition: Secondary | ICD-10-CM

## 2014-03-06 DIAGNOSIS — R5383 Other fatigue: Secondary | ICD-10-CM

## 2014-03-06 LAB — POCT URINALYSIS DIPSTICK
Bilirubin, UA: NEGATIVE
Blood, UA: NEGATIVE
Glucose, UA: NEGATIVE
Ketones, UA: NEGATIVE
Leukocytes, UA: NEGATIVE
Nitrite, UA: NEGATIVE
Protein, UA: NEGATIVE
Spec Grav, UA: <=1.005
Urobilinogen, UA: NEGATIVE
pH, UA: 7

## 2014-03-06 LAB — POCT CBC
Granulocyte percent: 54.4 %G (ref 37–80)
HCT, POC: 45.4 % (ref 43.5–53.7)
Hemoglobin: 14.9 g/dL (ref 14.1–18.1)
Lymph, poc: 3.8 — AB (ref 0.6–3.4)
MCH, POC: 28.9 pg (ref 27–31.2)
MCHC: 32.7 g/dL (ref 31.8–35.4)
MCV: 88.2 fL (ref 80–97)
MPV: 7.8 fL (ref 0–99.8)
POC Granulocyte: 5 (ref 2–6.9)
POC LYMPH PERCENT: 41.4 %L (ref 10–50)
Platelet Count, POC: 253 10*3/uL (ref 142–424)
RBC: 5.2 M/uL (ref 4.69–6.13)
RDW, POC: 13.9 %
WBC: 9.2 10*3/uL (ref 4.6–10.2)

## 2014-03-06 LAB — POCT UA - MICROSCOPIC ONLY
Bacteria, U Microscopic: NEGATIVE
Casts, Ur, LPF, POC: NEGATIVE
Crystals, Ur, HPF, POC: NEGATIVE
Mucus, UA: NEGATIVE
RBC, urine, microscopic: NEGATIVE
WBC, Ur, HPF, POC: NEGATIVE
Yeast, UA: NEGATIVE

## 2014-03-06 MED ORDER — LINACLOTIDE 145 MCG PO CAPS: 145.0000 ug | ORAL_CAPSULE | Freq: Every day | ORAL | Status: DC

## 2014-03-06 NOTE — Progress Notes (Signed)
   Subjective:    Patient ID: [name] [name], male    DOB: [birth year], 63 y.o.   MRN: [id number]  HPI  This 63 y.o. male presents for evaluation of CPE.  He is a smoker.  He has hx of CAD dx'd with cardiac cath a few years ago and he was told it was not significant and didn't require PCI.  He states he has difficulty with chronic constipation.  He does not want colonoscopy because he states his insurance will not pay.  He states he has hx of diverticulitis and constipation makes this worse.  Review of Systems C/o constipation   No chest pain, SOB, HA, dizziness, vision change, N/V, diarrhea, constipation, dysuria, urinary urgency or frequency, myalgias, arthralgias or rash.  Objective:   Physical Exam  Vital signs noted  Well developed well nourished male.  HEENT - Head atraumatic Normocephalic                Eyes - PERRLA, Conjuctiva - clear Sclera- Clear EOMI                Ears - EAC's Wnl TM's Wnl Gross Hearing WNL                Nose - Nares patent                 Throat - oropharanx wnl Respiratory - Lungs CTA bilateral Cardiac - RRR S1 and S2 without murmur GI - Abdomen soft Nontender and bowel sounds active x 4 Extremities - No edema. Neuro - Grossly intact.      Assessment & Plan:  Dyslipidemia - Plan: CMP14+EGFR, Lipid panel  Urinary frequency - Plan: POCT urinalysis dipstick, POCT UA - Microscopic Only, PSA, total and free  Other fatigue - Plan: POCT CBC, CMP14+EGFR, Thyroid Panel With TSH  IBS - Linzess 145 mcg one po qd #30w/11 rf  Exam - Discussed getting colonoscopy and he declines.  Discussed DC smoking.  Discussed he  Fish oil otc and ASA 81 mg po qd.  Tobacco abuse - discussed dc smoking   Lysbeth Penner FNP

## 2014-03-07 LAB — CMP14+EGFR
ALT: 15 IU/L (ref 0–44)
AST: 21 IU/L (ref 0–40)
Albumin/Globulin Ratio: 1.5 (ref 1.1–2.5)
Albumin: 4.3 g/dL (ref 3.6–4.8)
Alkaline Phosphatase: 126 IU/L — ABNORMAL HIGH (ref 39–117)
BUN/Creatinine Ratio: 18 (ref 10–22)
BUN: 19 mg/dL (ref 8–27)
CO2: 21 mmol/L (ref 18–29)
Calcium: 9.6 mg/dL (ref 8.6–10.2)
Chloride: 105 mmol/L (ref 97–108)
Creatinine, Ser: 1.04 mg/dL (ref 0.76–1.27)
GFR calc Af Amer: 88 mL/min/{1.73_m2} (ref >59)
GFR calc non Af Amer: 76 mL/min/{1.73_m2} (ref >59)
Globulin, Total: 2.9 g/dL (ref 1.5–4.5)
Glucose: 106 mg/dL — ABNORMAL HIGH (ref 65–99)
Potassium: 5.1 mmol/L (ref 3.5–5.2)
Sodium: 146 mmol/L — ABNORMAL HIGH (ref 134–144)
Total Bilirubin: 0.2 mg/dL (ref 0.0–1.2)
Total Protein: 7.2 g/dL (ref 6.0–8.5)

## 2014-03-07 LAB — LIPID PANEL
Chol/HDL Ratio: 5.4 ratio units — ABNORMAL HIGH (ref 0.0–5.0)
Cholesterol, Total: 236 mg/dL — ABNORMAL HIGH (ref 100–199)
HDL: 44 mg/dL (ref >39)
LDL Calculated: 170 mg/dL — ABNORMAL HIGH (ref 0–99)
Triglycerides: 112 mg/dL (ref 0–149)
VLDL Cholesterol Cal: 22 mg/dL (ref 5–40)

## 2014-03-07 LAB — THYROID PANEL WITH TSH
Free Thyroxine Index: 1.7 (ref 1.2–4.9)
T3 Uptake Ratio: 25 % (ref 24–39)
T4, Total: 6.9 ug/dL (ref 4.5–12.0)
TSH: 6.29 u[IU]/mL — ABNORMAL HIGH (ref 0.450–4.500)

## 2014-03-07 LAB — PSA, TOTAL AND FREE
PSA, Free Pct: 38.2 %
PSA, Free: 0.42 ng/mL
PSA: 1.1 ng/mL (ref 0.0–4.0)

## 2014-03-09 ENCOUNTER — Telehealth: Payer: Self-pay | Admitting: Family Medicine

## 2014-03-09 ENCOUNTER — Other Ambulatory Visit: Payer: Self-pay | Admitting: Family Medicine

## 2014-03-09 MED ORDER — LEVOTHYROXINE SODIUM 25 MCG PO TABS: 25.0000 ug | ORAL_TABLET | Freq: Every day | ORAL | Status: DC

## 2014-03-09 NOTE — Telephone Encounter (Signed)
Please review labs and i will call patient

## 2014-05-12 ENCOUNTER — Ambulatory Visit (INDEPENDENT_AMBULATORY_CARE_PROVIDER_SITE_OTHER): Payer: BC Managed Care – PPO | Admitting: Family Medicine

## 2014-05-12 ENCOUNTER — Encounter: Payer: Self-pay | Admitting: Family Medicine

## 2014-05-12 ENCOUNTER — Ambulatory Visit (INDEPENDENT_AMBULATORY_CARE_PROVIDER_SITE_OTHER): Payer: BC Managed Care – PPO

## 2014-05-12 VITALS — BP 113/78 | HR 80 | Temp 96.3°F | Ht 70.0 in | Wt 150.2 lb

## 2014-05-12 DIAGNOSIS — M25531 Pain in right wrist: Secondary | ICD-10-CM

## 2014-05-12 MED ORDER — NAPROXEN 500 MG PO TABS: 500.0000 mg | ORAL_TABLET | Freq: Two times a day (BID) | ORAL | Status: DC

## 2014-05-12 NOTE — Progress Notes (Signed)
   Subjective:    Patient ID: [name] [name], male    DOB: [birth year], 63 y.o.   MRN: [id number]  HPI Patient is here with c/o right antecubital region tenderness from an injury that occurred last week.  Review of Systems  Constitutional: Negative for fever.  HENT: Negative for ear pain.   Eyes: Negative for discharge.  Respiratory: Negative for cough.   Cardiovascular: Negative for chest pain.  Gastrointestinal: Negative for abdominal distention.  Endocrine: Negative for polyuria.  Genitourinary: Negative for difficulty urinating.  Musculoskeletal: Negative for gait problem and neck pain.  Skin: Negative for color change and rash.  Neurological: Negative for speech difficulty and headaches.  Psychiatric/Behavioral: Negative for agitation.       Objective:    BP 113/78 mmHg  Pulse 80  Temp(Src) 96.3 F (35.7 C) (Oral)  Ht 5\' 10"  (1.778 m)  Wt 150 lb 3.2 oz (68.13 kg)  BMI 21.55 kg/m2 Physical Exam  Constitutional: He is oriented to person, place, and time. He appears well-developed and well-nourished.  HENT:  Head: Normocephalic and atraumatic.  Mouth/Throat: Oropharynx is clear and moist.  Eyes: Pupils are equal, round, and reactive to light.  Neck: Normal range of motion. Neck supple.  Cardiovascular: Normal rate and regular rhythm.   No murmur heard. Pulmonary/Chest: Effort normal and breath sounds normal.  Abdominal: Soft. Bowel sounds are normal. There is no tenderness.  Neurological: He is alert and oriented to person, place, and time.  Skin: Skin is warm and dry.  Psychiatric: He has a normal mood and affect.          Assessment & Plan:     ICD-9-CM ICD-10-CM   1. Pain in joint, forearm, right 719.43 M25.531 DG Forearm Right     naproxen (NAPROSYN) 500 MG tablet     No Follow-up on file.  Lysbeth Penner FNP

## 2015-03-06 ENCOUNTER — Encounter: Payer: Self-pay | Admitting: Family Medicine

## 2015-03-06 ENCOUNTER — Ambulatory Visit (INDEPENDENT_AMBULATORY_CARE_PROVIDER_SITE_OTHER): Payer: 59 | Admitting: Family Medicine

## 2015-03-06 VITALS — BP 143/99 | HR 84 | Temp 96.7°F | Ht 70.0 in | Wt 151.4 lb

## 2015-03-06 DIAGNOSIS — E785 Hyperlipidemia, unspecified: Secondary | ICD-10-CM | POA: Diagnosis not present

## 2015-03-06 DIAGNOSIS — E039 Hypothyroidism, unspecified: Secondary | ICD-10-CM | POA: Insufficient documentation

## 2015-03-06 DIAGNOSIS — Z720 Tobacco use: Secondary | ICD-10-CM

## 2015-03-06 DIAGNOSIS — R030 Elevated blood-pressure reading, without diagnosis of hypertension: Secondary | ICD-10-CM | POA: Insufficient documentation

## 2015-03-06 DIAGNOSIS — E038 Other specified hypothyroidism: Secondary | ICD-10-CM

## 2015-03-06 DIAGNOSIS — I2510 Atherosclerotic heart disease of native coronary artery without angina pectoris: Secondary | ICD-10-CM

## 2015-03-06 DIAGNOSIS — F17200 Nicotine dependence, unspecified, uncomplicated: Secondary | ICD-10-CM

## 2015-03-06 DIAGNOSIS — E0590 Thyrotoxicosis, unspecified without thyrotoxic crisis or storm: Secondary | ICD-10-CM | POA: Insufficient documentation

## 2015-03-06 MED ORDER — SIMVASTATIN 10 MG PO TABS: 10.0000 mg | ORAL_TABLET | Freq: Every day | ORAL | Status: DC

## 2015-03-06 NOTE — Patient Instructions (Addendum)
Great to meet you!  We will try to get a colonoscopy next summer.   You also need a pneumonia shot  Please consider stopping smoking,   Bring the stool test back to our front office at your convenience,.   Lets follow up in 2 months.

## 2015-03-06 NOTE — Progress Notes (Signed)
   HPI  Patient presents for an annual l exam and lab work  Hypothyroidism He's been on Synthroid for about a year and has not noticed any difference. He would like to stop the medication if he can. He denies decreased energy, hair loss, nail changes  Explains how much much money he spends on health insurance every month, also his deductible is $6500 so he is essentially cash patient when he comes in.  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  He is not willing to do colonoscopy right now due to cost. He is also not willing to get a pneumonia shot due to cost.  PMH - smoking status noted, , ROS: Per HPI  Objective: BP 143/99 mmHg  Pulse 84  Temp(Src) 96.7 F (35.9 C) (Oral)  Ht $R'5\' 10"'jm$  (1.778 m)  Wt 151 lb 6.4 oz (68.675 kg)  BMI 21.72 kg/m2 Gen: NAD, alert, cooperative with exam HEENT: NCAT, TMs WNL BL, oropharynx clear, nares clear, he has dentures Neck: Well-healed scar bilaterally from bilateral CEA. No thyromegaly tobacco abuse ood S1/S2, no murmur Resp: CTABL, no wheezes, non-labored Abd: SNTND, BS present, no guarding or organomegaly Ext: No edema, warm Neuro: Alert and oriented, No gross deficits  Assesstobacco abuse ment and plan:  # annual exam Recommended C scope, he cant afford it currently Recommed prevnar, cant afford right now Fasting labs  # tobacco abuse Recommended quitting, he's not interested  # elevated CV risk, HLD Statin intolerance X2, Try low dose simvastatin ASCVD risk in 10 years = 23.9%  # elevatated BP without Dx of HTN Log, recheck in 1 month Consider 5 mg amlodiine for now as he will likely not return for labwork until he is on medicare.   # subclinical hypothyroidism On low dose synthroid, wants to quit Previously TSH 6.2 but T4 normal, so ok to quit if the same is still true   Orders Placed This  Encounter  Procedures  . CBC with Differential  . CMP14+EGFR  . Lipid Panel  . TSH  . T4, Free    Meds ordered this encounter  Medications  . Multiple Vitamin (MULTI VITAMIN MENS) tablet    Sig: Take 1 tablet by mouth daily.    Laroy Apple, MD [facility] 03/06/2015, 9:30 AM

## 2015-03-07 LAB — CBC WITH DIFFERENTIAL/PLATELET
BASOS ABS: 0 10*3/uL (ref 0.0–0.2)
Basos: 0 %
EOS (ABSOLUTE): 0.2 10*3/uL (ref 0.0–0.4)
EOS: 2 %
HEMATOCRIT: 49.7 % (ref 37.5–51.0)
HEMOGLOBIN: 16.5 g/dL (ref 12.6–17.7)
IMMATURE GRANS (ABS): 0 10*3/uL (ref 0.0–0.1)
Immature Granulocytes: 0 %
LYMPHS ABS: 4.6 10*3/uL — AB (ref 0.7–3.1)
LYMPHS: 46 %
MCH: 30.5 pg (ref 26.6–33.0)
MCHC: 33.2 g/dL (ref 31.5–35.7)
MCV: 92 fL (ref 79–97)
MONOCYTES: 7 %
Monocytes Absolute: 0.7 10*3/uL (ref 0.1–0.9)
Neutrophils Absolute: 4.5 10*3/uL (ref 1.4–7.0)
Neutrophils: 45 %
Platelets: 213 10*3/uL (ref 150–379)
RBC: 5.41 x10E6/uL (ref 4.14–5.80)
RDW: 13.6 % (ref 12.3–15.4)
WBC: 9.9 10*3/uL (ref 3.4–10.8)

## 2015-03-07 LAB — CMP14+EGFR
ALBUMIN: 4.4 g/dL (ref 3.6–4.8)
ALT: 13 IU/L (ref 0–44)
AST: 19 IU/L (ref 0–40)
Albumin/Globulin Ratio: 1.5 (ref 1.1–2.5)
Alkaline Phosphatase: 119 IU/L — ABNORMAL HIGH (ref 39–117)
BUN / CREAT RATIO: 13 (ref 10–22)
BUN: 13 mg/dL (ref 8–27)
Bilirubin Total: 0.6 mg/dL (ref 0.0–1.2)
CO2: 26 mmol/L (ref 18–29)
CREATININE: 1.04 mg/dL (ref 0.76–1.27)
Calcium: 10.1 mg/dL (ref 8.6–10.2)
Chloride: 100 mmol/L (ref 97–108)
GFR, EST AFRICAN AMERICAN: 87 mL/min/{1.73_m2} (ref >59)
GFR, EST NON AFRICAN AMERICAN: 76 mL/min/{1.73_m2} (ref >59)
GLOBULIN, TOTAL: 3 g/dL (ref 1.5–4.5)
GLUCOSE: 106 mg/dL — AB (ref 65–99)
Potassium: 5 mmol/L (ref 3.5–5.2)
SODIUM: 143 mmol/L (ref 134–144)
TOTAL PROTEIN: 7.4 g/dL (ref 6.0–8.5)

## 2015-03-07 LAB — LIPID PANEL
CHOLESTEROL TOTAL: 251 mg/dL — AB (ref 100–199)
Chol/HDL Ratio: 5.5 ratio units — ABNORMAL HIGH (ref 0.0–5.0)
HDL: 46 mg/dL (ref >39)
LDL CALC: 178 mg/dL — AB (ref 0–99)
Triglycerides: 133 mg/dL (ref 0–149)
VLDL Cholesterol Cal: 27 mg/dL (ref 5–40)

## 2015-03-07 LAB — T4, FREE: Free T4: 1.11 ng/dL (ref 0.82–1.77)

## 2015-03-07 LAB — TSH: TSH: 3.6 u[IU]/mL (ref 0.450–4.500)

## 2015-07-17 ENCOUNTER — Ambulatory Visit (INDEPENDENT_AMBULATORY_CARE_PROVIDER_SITE_OTHER): Payer: Self-pay | Admitting: Family Medicine

## 2015-07-17 ENCOUNTER — Encounter: Payer: Self-pay | Admitting: Family Medicine

## 2015-07-17 VITALS — BP 140/86 | HR 89 | Temp 96.6°F | Ht 70.0 in | Wt 153.0 lb

## 2015-07-17 DIAGNOSIS — K5792 Diverticulitis of intestine, part unspecified, without perforation or abscess without bleeding: Secondary | ICD-10-CM

## 2015-07-17 MED ORDER — METRONIDAZOLE 500 MG PO TABS: 500.0000 mg | ORAL_TABLET | Freq: Three times a day (TID) | ORAL | Status: DC

## 2015-07-17 MED ORDER — CIPROFLOXACIN HCL 500 MG PO TABS: 500.0000 mg | ORAL_TABLET | Freq: Two times a day (BID) | ORAL | Status: DC

## 2015-07-17 NOTE — Patient Instructions (Signed)
Great to meet you!  Please let us know if you get worse or do not get better as expected.   Finish all of the antibiotics Do not drink alcohol with flagyl (metronidazole), it will cause severe nausea  Diverticulitis Diverticulitis is inflammation or infection of small pouches in your colon that form when you have a condition called diverticulosis. The pouches in your colon are called diverticula. Your colon, or large intestine, is where water is absorbed and stool is formed. Complications of diverticulitis can include:  Bleeding.  Severe infection.  Severe pain.  Perforation of your colon.  Obstruction of your colon. CAUSES  Diverticulitis is caused by bacteria. Diverticulitis happens when stool becomes trapped in diverticula. This allows bacteria to grow in the diverticula, which can lead to inflammation and infection. RISK FACTORS People with diverticulosis are at risk for diverticulitis. Eating a diet that does not include enough fiber from fruits and vegetables may make diverticulitis more likely to develop. SYMPTOMS  Symptoms of diverticulitis may include:  Abdominal pain and tenderness. The pain is normally located on the left side of the abdomen, but may occur in other areas.  Fever and chills.  Bloating.  Cramping.  Nausea.  Vomiting.  Constipation.  Diarrhea.  Blood in your stool. DIAGNOSIS  Your health care provider will ask you about your medical history and do a physical exam. You may need to have tests done because many medical conditions can cause the same symptoms as diverticulitis. Tests may include:  Blood tests.  Urine tests.  Imaging tests of the abdomen, including X-rays and CT scans. When your condition is under control, your health care provider may recommend that you have a colonoscopy. A colonoscopy can show how severe your diverticula are and whether something else is causing your symptoms. TREATMENT  Most cases of diverticulitis are  mild and can be treated at home. Treatment may include:  Taking over-the-counter pain medicines.  Following a clear liquid diet.  Taking antibiotic medicines by mouth for 7-10 days. More severe cases may be treated at a hospital. Treatment may include:  Not eating or drinking.  Taking prescription pain medicine.  Receiving antibiotic medicines through an IV tube.  Receiving fluids and nutrition through an IV tube.  Surgery. HOME CARE INSTRUCTIONS   Follow your health care provider's instructions carefully.  Follow a full liquid diet or other diet as directed by your health care provider. After your symptoms improve, your health care provider may tell you to change your diet. He or she may recommend you eat a high-fiber diet. Fruits and vegetables are good sources of fiber. Fiber makes it easier to pass stool.  Take fiber supplements or probiotics as directed by your health care provider.  Only take medicines as directed by your health care provider.  Keep all your follow-up appointments. SEEK MEDICAL CARE IF:   Your pain does not improve.  You have a hard time eating food.  Your bowel movements do not return to normal. SEEK IMMEDIATE MEDICAL CARE IF:   Your pain becomes worse.  Your symptoms do not get better.  Your symptoms suddenly get worse.  You have a fever.  You have repeated vomiting.  You have bloody or black, tarry stools. MAKE SURE YOU:   Understand these instructions.  Will watch your condition.  Will get help right away if you are not doing well or get worse.   This information is not intended to replace advice given to you by your health care provider.  Make sure you discuss any questions you have with your health care provider.   Document Released: 03/19/2005 Document Revised: 06/14/2013 Document Reviewed: 05/04/2013 Elsevier Interactive Patient Education [employer].

## 2015-07-17 NOTE — Progress Notes (Signed)
   HPI  Patient presents today here with abdominal bloating and discomfort.  Patient explains that over the last 3-4 days he's had continuous abdominal bloating and discomfort. He states that this is similar to the onset of his previous episodes of diverticulitis. He does not have any fevers, chills, sweats, or diarrhea.  He is breathing easily, tolerating foods and fluids normally. He does have increased bloating discomfort after eating.   PMH: Smoking status noted ROS: Per HPI  Objective: BP 140/86 mmHg  Pulse 89  Temp(Src) 96.6 F (35.9 C) (Oral)  Ht 5\' 10"  (1.778 m)  Wt 153 lb (69.4 kg)  BMI 21.95 kg/m2 Gen: NAD, alert, cooperative with exam HEENT: NCAT CV: RRR, good S1/S2, no murmur Resp: CTABL, no wheezes, non-labored Abd: SNTND, BS present, no guarding or organomegaly  Assessment and plan:  # Diverticulitis Mild, likely developing diverticulitis Treat with Cipro and Flagyl Discussed with patient low threshold for return if worsening Return to clinic if worsening or does not improve as expected   Meds ordered this encounter  Medications  . ciprofloxacin (CIPRO) 500 MG tablet    Sig: Take 1 tablet (500 mg total) by mouth 2 (two) times daily.    Dispense:  14 tablet    Refill:  0  . metroNIDAZOLE (FLAGYL) 500 MG tablet    Sig: Take 1 tablet (500 mg total) by mouth 3 (three) times daily.    Dispense:  21 tablet    Refill:  0    Laroy Apple, MD [facility] 07/17/2015, 9:11 AM

## 2015-12-24 ENCOUNTER — Encounter: Payer: Self-pay | Admitting: Family Medicine

## 2016-01-07 ENCOUNTER — Emergency Department (HOSPITAL_COMMUNITY): Payer: PPO

## 2016-01-07 ENCOUNTER — Encounter: Payer: Self-pay | Admitting: Pediatrics

## 2016-01-07 ENCOUNTER — Emergency Department (HOSPITAL_COMMUNITY)
Admission: EM | Admit: 2016-01-07 | Discharge: 2016-01-07 | Disposition: A | Discharge status: Home / Self Care | Payer: PPO | Attending: Emergency Medicine | Admitting: Emergency Medicine

## 2016-01-07 ENCOUNTER — Ambulatory Visit (INDEPENDENT_AMBULATORY_CARE_PROVIDER_SITE_OTHER): Payer: PPO | Admitting: Pediatrics

## 2016-01-07 ENCOUNTER — Encounter (HOSPITAL_COMMUNITY): Payer: Self-pay | Admitting: Emergency Medicine

## 2016-01-07 VITALS — BP 154/92 | HR 70 | Temp 97.2°F | Ht 70.0 in | Wt 149.2 lb

## 2016-01-07 DIAGNOSIS — R030 Elevated blood-pressure reading, without diagnosis of hypertension: Secondary | ICD-10-CM

## 2016-01-07 DIAGNOSIS — E785 Hyperlipidemia, unspecified: Secondary | ICD-10-CM

## 2016-01-07 DIAGNOSIS — R55 Syncope and collapse: Secondary | ICD-10-CM

## 2016-01-07 DIAGNOSIS — R42 Dizziness and giddiness: Secondary | ICD-10-CM | POA: Diagnosis not present

## 2016-01-07 DIAGNOSIS — R6889 Other general symptoms and signs: Secondary | ICD-10-CM | POA: Diagnosis not present

## 2016-01-07 DIAGNOSIS — Z8042 Family history of malignant neoplasm of prostate: Secondary | ICD-10-CM | POA: Diagnosis not present

## 2016-01-07 DIAGNOSIS — R059 Cough, unspecified: Secondary | ICD-10-CM

## 2016-01-07 DIAGNOSIS — I2510 Atherosclerotic heart disease of native coronary artery without angina pectoris: Secondary | ICD-10-CM | POA: Insufficient documentation

## 2016-01-07 DIAGNOSIS — R05 Cough: Secondary | ICD-10-CM

## 2016-01-07 DIAGNOSIS — Z23 Encounter for immunization: Secondary | ICD-10-CM

## 2016-01-07 DIAGNOSIS — Z0001 Encounter for general adult medical examination with abnormal findings: Secondary | ICD-10-CM

## 2016-01-07 DIAGNOSIS — Z0000 Encounter for general adult medical examination without abnormal findings: Secondary | ICD-10-CM

## 2016-01-07 DIAGNOSIS — R739 Hyperglycemia, unspecified: Secondary | ICD-10-CM | POA: Diagnosis not present

## 2016-01-07 DIAGNOSIS — Z7982 Long term (current) use of aspirin: Secondary | ICD-10-CM | POA: Insufficient documentation

## 2016-01-07 DIAGNOSIS — F17210 Nicotine dependence, cigarettes, uncomplicated: Secondary | ICD-10-CM | POA: Insufficient documentation

## 2016-01-07 DIAGNOSIS — R531 Weakness: Secondary | ICD-10-CM | POA: Diagnosis not present

## 2016-01-07 DIAGNOSIS — N400 Enlarged prostate without lower urinary tract symptoms: Secondary | ICD-10-CM

## 2016-01-07 DIAGNOSIS — R011 Cardiac murmur, unspecified: Secondary | ICD-10-CM

## 2016-01-07 DIAGNOSIS — R399 Unspecified symptoms and signs involving the genitourinary system: Secondary | ICD-10-CM

## 2016-01-07 DIAGNOSIS — I2699 Other pulmonary embolism without acute cor pulmonale: Secondary | ICD-10-CM

## 2016-01-07 DIAGNOSIS — R0602 Shortness of breath: Secondary | ICD-10-CM

## 2016-01-07 DIAGNOSIS — Z720 Tobacco use: Secondary | ICD-10-CM

## 2016-01-07 DIAGNOSIS — Z1211 Encounter for screening for malignant neoplasm of colon: Secondary | ICD-10-CM

## 2016-01-07 DIAGNOSIS — R404 Transient alteration of awareness: Secondary | ICD-10-CM | POA: Diagnosis not present

## 2016-01-07 DIAGNOSIS — Z87891 Personal history of nicotine dependence: Secondary | ICD-10-CM

## 2016-01-07 DIAGNOSIS — IMO0001 Reserved for inherently not codable concepts without codable children: Secondary | ICD-10-CM

## 2016-01-07 LAB — URINALYSIS, COMPLETE
Bilirubin, UA: NEGATIVE
Glucose, UA: NEGATIVE
Ketones, UA: NEGATIVE
LEUKOCYTES UA: NEGATIVE
Nitrite, UA: NEGATIVE
PH UA: 7 (ref 5.0–7.5)
PROTEIN UA: NEGATIVE
RBC, UA: NEGATIVE
Specific Gravity, UA: 1.01 (ref 1.005–1.030)
UUROB: 0.2 mg/dL (ref 0.2–1.0)

## 2016-01-07 LAB — URINALYSIS, ROUTINE W REFLEX MICROSCOPIC
BILIRUBIN URINE: NEGATIVE
GLUCOSE, UA: NEGATIVE mg/dL
HGB URINE DIPSTICK: NEGATIVE
Ketones, ur: NEGATIVE mg/dL
Leukocytes, UA: NEGATIVE
Nitrite: NEGATIVE
PH: 6 (ref 5.0–8.0)
Protein, ur: NEGATIVE mg/dL
SPECIFIC GRAVITY, URINE: 1.023 (ref 1.005–1.030)

## 2016-01-07 LAB — BASIC METABOLIC PANEL
ANION GAP: 7 (ref 5–15)
BUN: 16 mg/dL (ref 6–20)
CHLORIDE: 106 mmol/L (ref 101–111)
CO2: 24 mmol/L (ref 22–32)
Calcium: 9.3 mg/dL (ref 8.9–10.3)
Creatinine, Ser: 1.08 mg/dL (ref 0.61–1.24)
GFR calc non Af Amer: >60 mL/min (ref >60)
GLUCOSE: 145 mg/dL — AB (ref 65–99)
Potassium: 4.4 mmol/L (ref 3.5–5.1)
Sodium: 137 mmol/L (ref 135–145)

## 2016-01-07 LAB — BAYER DCA HB A1C WAIVED: HB A1C: 5.9 % (ref <7.0)

## 2016-01-07 LAB — CBC
HEMATOCRIT: 45.4 % (ref 39.0–52.0)
HEMOGLOBIN: 15.5 g/dL (ref 13.0–17.0)
MCH: 30.6 pg (ref 26.0–34.0)
MCHC: 34.1 g/dL (ref 30.0–36.0)
MCV: 89.7 fL (ref 78.0–100.0)
Platelets: 202 10*3/uL (ref 150–400)
RBC: 5.06 MIL/uL (ref 4.22–5.81)
RDW: 12.9 % (ref 11.5–15.5)
WBC: 9.7 10*3/uL (ref 4.0–10.5)

## 2016-01-07 LAB — MICROSCOPIC EXAMINATION
Bacteria, UA: NONE SEEN
EPITHELIAL CELLS (NON RENAL): NONE SEEN /HPF (ref 0–10)
RBC MICROSCOPIC, UA: NONE SEEN /HPF (ref 0–?)
WBC UA: NONE SEEN /HPF (ref 0–?)

## 2016-01-07 LAB — CBG MONITORING, ED: GLUCOSE-CAPILLARY: 161 mg/dL — AB (ref 65–99)

## 2016-01-07 LAB — D-DIMER, QUANTITATIVE: D-Dimer, Quant: 0.94 ug/mL-FEU — ABNORMAL HIGH (ref 0.00–0.50)

## 2016-01-07 MED ORDER — PRAVASTATIN SODIUM 20 MG PO TABS: 20.0000 mg | ORAL_TABLET | Freq: Every day | ORAL | Status: DC

## 2016-01-07 MED ORDER — TAMSULOSIN HCL 0.4 MG PO CAPS: 0.4000 mg | ORAL_CAPSULE | Freq: Every day | ORAL | Status: DC

## 2016-01-07 MED ORDER — ALBUTEROL SULFATE HFA 108 (90 BASE) MCG/ACT IN AERS: 2.0000 | INHALATION_SPRAY | RESPIRATORY_TRACT | Status: DC | PRN

## 2016-01-07 MED ORDER — IOPAMIDOL (ISOVUE-370) INJECTION 76%
INTRAVENOUS | Status: AC
  Administered 2016-01-07: 100 mL
  Filled 2016-01-07: qty 100

## 2016-01-07 MED ORDER — VARENICLINE TARTRATE 0.5 MG X 11 & 1 MG X 42 PO MISC: ORAL | Status: DC

## 2016-01-07 NOTE — Patient Instructions (Signed)
Start tamsulosin for prostate Start chantix for smoking cessation. Start the dose pack two weeks before your quit date. Come back to see me in 2 months. Labs today Start pravastatin 20mg  every night Will get you back in to see Dr. Percival Spanish Aorta ultrasound

## 2016-01-07 NOTE — ED Notes (Signed)
Pt returned from ct

## 2016-01-07 NOTE — ED Notes (Signed)
Pt and family understood dc material. NAD noted 

## 2016-01-07 NOTE — ED Provider Notes (Addendum)
CSN: [id number]     Arrival date & time 01/07/16  1903 History   First MD Initiated Contact with Patient 01/07/16 1907     Chief Complaint  Patient presents with  . Near Syncope     (Consider location/radiation/quality/duration/timing/severity/associated sxs/prior Treatment) HPI    Started meds today Had episode of near syncope that lasted < 1 hour Acute onset just prior to arrival No associated symptoms No treatment prior to arrival Sx were constant but resolved spontaneously  Past Medical History  Diagnosis Date  . Coronary artery disease     left main had luminal irregularities, the LAD had 30% stenosis, the first diagonal had moderate-sized with ostial 35% stenosis, the circumflex in the AV groove had ostial 25% stenosis, the first obtuse marginal had long proximal 30% stenosis, the mid obtuse marginal was normal, the right coronary artery was dominant and occluded proximally.  There was excellent left to right collateral filling.     Marland Kitchen CAD (coronary artery disease)     The EF was 55% with inferior hypokinesis. Cath 09  . Carotid stenosis   . Dyslipidemia   . Lung nodule     benign  . Tobacco abuse   . Osteoporosis   . GERD (gastroesophageal reflux disease)   . Anxiety   . Depression    Past Surgical History  Procedure Laterality Date  . Throat surgery    . Left inguinal herniorrhaphy    . Carotid endarterectomy Right    Family History  Problem Relation Age of Onset  . Lung disease Father   . Lupus Mother   . Lupus Brother   . Cancer Brother     colon  . Lung disease Brother    Social History  Substance Use Topics  . Smoking status: Current Every Day Smoker -- 1.00 packs/day for 40 years    Types: Cigarettes  . Smokeless tobacco: None  . Alcohol Use: Yes     Comment: rarely    Review of Systems  All other systems reviewed and are negative.     Allergies  Crestor; Flomax; and Lipitor  Home Medications   Prior to Admission medications    Medication Sig Start Date End Date Taking? Authorizing Provider  Cholecalciferol (VITAMIN D-3 PO) Take 1 capsule by mouth daily.    Yes Historical Provider, MD  fish oil-omega-3 fatty acids 1000 MG capsule Take 1,000 mg by mouth daily.    Yes Historical Provider, MD  linaclotide (LINZESS) 72 MCG capsule Take 72 mcg by mouth every morning.   Yes Historical Provider, MD  Multiple Vitamin (MULTI VITAMIN MENS) tablet Take 1 tablet by mouth daily.   Yes Historical Provider, MD  Probiotic Product (PROBIOTIC & ACIDOPHILUS EX ST PO) Take 1 capsule by mouth daily as needed (for GI symptoms).    Yes Historical Provider, MD  tamsulosin (FLOMAX) 0.4 MG CAPS capsule Take 1 capsule (0.4 mg total) by mouth daily. 01/07/16  Yes Eustaquio Maize, MD  albuterol (PROVENTIL HFA;VENTOLIN HFA) 108 (90 Base) MCG/ACT inhaler Inhale 2 puffs into the lungs every 4 (four) hours as needed for wheezing or shortness of breath. 01/07/16   Noemi Chapel, MD  aspirin EC 81 MG tablet Take 81 mg by mouth daily.    Historical Provider, MD  pravastatin (PRAVACHOL) 20 MG tablet Take 1 tablet (20 mg total) by mouth daily. 01/07/16   Eustaquio Maize, MD  varenicline (CHANTIX STARTING MONTH PAK) 0.5 MG X 11 & 1 MG X 42 tablet Take  one 0.5 mg tablet by mouth once daily for 3 days, then increase to one 0.5 mg tablet BID for 4 days, then increase to one 1 mg tablet BID. 01/07/16   Eustaquio Maize, MD   BP 111/72 mmHg  Pulse 70  Temp(Src) 97.8 F (36.6 C) (Oral)  Resp 13  SpO2 97% Physical Exam  Constitutional: He is oriented to person, place, and time. He appears well-developed and well-nourished. No distress.  HENT:  Head: Normocephalic and atraumatic.  Eyes: Conjunctivae are normal.  Cardiovascular: Normal rate and normal heart sounds.   No murmur heard. Pulmonary/Chest: Effort normal. He has wheezes (right, expiratory).  Abdominal: Soft. There is no tenderness.  Musculoskeletal: He exhibits no edema or tenderness.  Neurological: He  is alert and oriented to person, place, and time.  Skin: Skin is warm. He is not diaphoretic.  Psychiatric: He has a normal mood and affect. His behavior is normal.  Nursing note and vitals reviewed.   ED Course  Procedures (including critical care time) Labs Review Labs Reviewed  BASIC METABOLIC PANEL - Abnormal; Notable for the following:    Glucose, Bld 145 (*)    All other components within normal limits  D-DIMER, QUANTITATIVE (NOT AT [facility]) - Abnormal; Notable for the following:    D-Dimer, Quant 0.94 (*)    All other components within normal limits  CBG MONITORING, ED - Abnormal; Notable for the following:    Glucose-Capillary 161 (*)    All other components within normal limits  CBC  URINALYSIS, ROUTINE W REFLEX MICROSCOPIC (NOT AT [facility])    Imaging Review Ct Angio Chest Pe W Or Wo Contrast  01/07/2016  CLINICAL DATA:  Lightheadedness and dizziness. Concern for pulmonary embolus. EXAM: CT ANGIOGRAPHY CHEST WITH CONTRAST TECHNIQUE: Multidetector CT imaging of the chest was performed using the standard protocol during bolus administration of intravenous contrast. Multiplanar CT image reconstructions and MIPs were obtained to evaluate the vascular anatomy. CONTRAST:  80 mL of Isovue 370 COMPARISON:  Chest x-ray from earlier today FINDINGS: Central airways are normal. No pneumothorax. Moderate to severe emphysematous changes are seen in the lungs. There is a calcified granuloma in the right lung on series 6, image 42. Several small bilateral uncalcified nodules are seen as well. A representative nodule on the right, seen on series 6, image 67, measuring 4.5 mm. A representative nodule on the left as seen on series 6, image 67 measuring 7 mm. Several other small bilateral nodules are seen. No suspicious masses or infiltrates. The thoracic aorta is normal in caliber. Hard and soft plaque is seen in the origins of the right brachiocephalic, left common carotid, and left subclavian arteries.  There is significant narrowing of the proximal left subclavian artery as seen on series 4, image 23. No effusions. A few calcified nodes are seen in the right hilum consistent previous granulomatous disease. No uncalcified adenopathy is identified. The heart size is normal. No effusions. No pulmonary emboli identified. Evaluation of the upper abdomen demonstrates cholelithiasis. No other acute abnormalities. No suspicious bony lesions. Review of the MIP images confirms the above findings. IMPRESSION: 1. No pulmonary emboli. 2. Bilateral pulmonary nodules as described above with the largest uncalcified nodule measuring 7 mm on the left. See below for follow-up recommendations. 3. Hard and soft plaque at the origins of the right brachiocephalic, left common carotid, and left subclavian arteries as described above. Non-contrast chest CT at 3-6 months is recommended. If the nodules are stable at time of repeat CT,  then future CT at 18-24 months (from today's scan) is considered optional for low-risk patients, but is recommended for high-risk patients. This recommendation follows the consensus statement: Guidelines for Management of Incidental Pulmonary Nodules Detected on CT Images:From the Fleischner Society 2017; published online before print (10.1148/radiol.SG:5268862). Electronically Signed   By: Dorise Bullion III M.D   On: 01/07/2016 23:08   Dg Chest Port 1 View  01/07/2016  CLINICAL DATA:  Syncope and cough for 1 day. EXAM: PORTABLE CHEST 1 VIEW COMPARISON:  Single view of the chest 03/07/2013. FINDINGS: The chest is hyperexpanded with attenuation of the pulmonary vasculature. Lungs are clear. Heart size is normal. No pneumothorax or pleural effusion. IMPRESSION: COPD without acute disease. Electronically Signed   By: Inge Rise M.D.   On: 01/07/2016 20:54   I have personally reviewed and evaluated these images and lab results as part of my medical decision-making.   EKG  Interpretation   Date/Time:  Monday January 07 2016 19:40:06 EDT Ventricular Rate:  81 PR Interval:    QRS Duration: 97 QT Interval:  387 QTC Calculation: 450 R Axis:   86 Text Interpretation:  Sinus rhythm Borderline right axis deviation Since  last tracing rate slower Confirmed by Jadia Capers  MD, Limuel Nieblas ([id number]) on  01/07/2016 8:08:08 PM      MDM   Final diagnoses:  Near syncope   Patient presents with near syncope episode after starting tamsulosin earlier today. The near-syncope episode occurred for hours after his first dose. 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 CT of the chest demonstrated no evidence of pulmonary embolism but did show pulmonary nodules that the patient was made aware of. He was instructed to follow-up with a repeat CT of the chest in 3-6 months.  EKG showed normal sinus rhythm with ventricular rate of 81 bpm and no evidence of ischemia, abnormal intervals, dysrhythmia, WPW, Brugada syndrome, or long QT. Patient was discharged home in good condition with instructions not to take tamsulosin until he is cleared to do so by primary care as this is the likely etiology of his lightheadedness. He was able to ambulate without difficulty in the emergency department prior to discharge   Allie Bossier, MD 01/08/16 [place], MD 01/08/16 [id number]  Noemi Chapel, MD 01/23/16 [phone]

## 2016-01-07 NOTE — ED Notes (Signed)
Patient transported to CT 

## 2016-01-07 NOTE — Progress Notes (Signed)
Subjective:    Patient ID: [name] [name], male    DOB: 09-30-1950, 65 y.o.   MRN: [id number]  CC: Annual Exam; Burning with urination; and Groin Pain   HPI: [name] [name] is a 65 y.o. male presenting for Annual Exam; Burning with urination; and Groin Pain  Burning with urination: every time he goes burns, also with frequency. Ongoing past week No new sexual partners  Groin: Pain since surgery years ago Ongoing, not bad, worse when constipated  Constipation: Since the hernia surgery years ago has to take laxative to stool  Falls: no falls Cognition: no trouble with memory  HTN: at home BP 120s  Had episode of feeling tightness last week while sitting down under L arm Usually feels it when he is having a hard time with gas, ongoing problem with diverticulitis and constipation He says he is more SOB over last few weeks than he has been in the past Continues to smoke   Depression screen [facility] 2/9 01/08/2016 01/07/2016 07/17/2015 03/06/2015 05/12/2014  Decreased Interest 0 0 0 0 0  Down, Depressed, Hopeless 0 0 0 0 0  PHQ - 2 Score 0 0 0 0 0     Relevant past medical, surgical, family and social history reviewed and updated as indicated.  Interim medical history since our last visit reviewed. Allergies and medications reviewed and updated.  ROS: All systems negative other than what is in HPI  History  Smoking status  . Current Every Day Smoker -- 1.00 packs/day for 40 years  . Types: Cigarettes  Smokeless tobacco  . Not on file       Objective:    BP 154/92 mmHg  Pulse 70  Temp(Src) 97.2 F (36.2 C) (Oral)  Ht 5\' 10"  (1.778 m)  Wt 149 lb 3.2 oz (67.677 kg)  BMI 21.41 kg/m2  Wt Readings from Last 3 Encounters:  01/08/16 148 lb 3.2 oz (67.223 kg)  01/07/16 149 lb 3.2 oz (67.677 kg)  07/17/15 153 lb (69.4 kg)     Gen: NAD, alert, cooperative with exam, NCAT EYES: EOMI, no scleral injection or icterus ENT:  TMs pearly gray b/l, OP without  erythema LYMPH: no cervical LAD CV: NRRR, normal [id number], systolic ejection murmur II/VI at RUSB, distal pulses 2+ b/l Resp: CTABL, no wheezes, normal WOB Abd: +BS, soft, NTND. no guarding or organomegaly Ext: No edema, warm Neuro: Alert and oriented, strength equal b/l UE and LE, coordination grossly normal MSK: normal muscle bulk     Assessment & Plan:    [name] [name] was seen today for annual exam.  Diagnoses and all orders for this visit:  Encounter for general adult medical examination with abnormal findings -     US Aorta; Future -     Hepatitis C antibody screen -     Basic metabolic panel -     PSA, total and free -     Pneumococcal conjugate vaccine 13-valent  UTI symptoms No signs of infection Groin pain unchanged, started after hernia surgery years ago -     Urinalysis, Complete  History of smoking 25-50 pack years -     US Aorta; Future  Family history of prostate cancer -     PSA, total and free  Tobacco abuse -     varenicline (CHANTIX STARTING MONTH PAK) 0.5 MG X 11 & 1 MG X 42 tablet; Take one 0.5 mg tablet by mouth once daily for 3 days, then increase to one 0.5 mg  tablet BID for 4 days, then increase to one 1 mg tablet BID. (Patient not taking: Reported on 01/08/2016)  Hyperglycemia -     Bayer DCA Hb A1c Waived  Screen for colon cancer -     Fecal occult blood, imunochemical; Future  BPH (benign prostatic hyperplasia) -     tamsulosin (FLOMAX) 0.4 MG CAPS capsule; Take 1 capsule (0.4 mg total) by mouth daily.  HLD (hyperlipidemia) -     pravastatin (PRAVACHOL) 20 MG tablet; Take 1 tablet (20 mg total) by mouth daily. (Patient not taking: Reported on 01/08/2016)  Shortness of breath Current smoker, wants to quit H/o CAD. Exercise is limited by SOB. -     Ambulatory referral to Cardiology  Atherosclerosis of native coronary artery of native heart without angina pectoris Previously followed by Dr. Percival Spanish, pt wants to follow up with him. -      Ambulatory referral to Cardiology  Routine history and physical examination of adult  Murmur Aortic valve murmur, in 2014 [facility] with mildly calcified annulus. Will monitor. Elevated blood pressure  Other orders -     Microscopic Examination     Follow up plan: Return for 2 weeks for BP recheck nurse visit, 2 months with Dr. Evette Doffing. F/u smoking cessation  Assunta Found, MD [facility] 01/07/2016, 8:59 AM

## 2016-01-07 NOTE — ED Provider Notes (Signed)
Patient is a 65 year old male, he was recently started on tamsulosin for prostate problems, this was today, he took his first dose at 11:00 in the morning, several hours later while he was sitting down he felt an overwhelming sense of diaphoresis lightheadedness and near-syncope. This lasted for approximately 45 minutes gradually improving and by the time he was in the ambulance he was much better. On arrival the patient has no complaints and on exam has clear heart and lung sounds, no peripheral edema or asymmetry, no murmurs rubs or gallops, normal neurologic exam. 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 At this time the patient does not have any significant edema. We'll obtain labs, EKG and a d-dimer to rule out PE as the patient is low risk but not no risk. This is likely related to the medications that he started today.  D dimer elevated but CT neg for PE  There are nodules - pt aware of same   EKG Interpretation  Date/Time:  Monday January 07 2016 19:40:06 EDT Ventricular Rate:  81 PR Interval:    QRS Duration: 97 QT Interval:  387 QTC Calculation: 450 R Axis:   86 Text Interpretation:  Sinus rhythm Borderline right axis deviation Since last tracing rate slower Confirmed by Omario Ander  MD, Jayen Bromwell ([id number]) on 01/07/2016 8:08:08 PM Also confirmed by Sabra Heck  MD, [place] ([id number]), editor [place]  CCT, [place] ([id number])  on 01/08/2016 7:11:44 AM      I saw and evaluated the patient, reviewed the resident's note and I agree with the findings and plan.   I personally interpreted the EKG as well as the resident and agree with the interpretation on the resident's chart.  Final diagnoses:  Near syncope      Noemi Chapel, MD 01/08/16 [phone]

## 2016-01-07 NOTE — ED Notes (Signed)
Per [facility] patient was eating a restaraunt when he suddenly became light headed and dizzy.  Patient denies syncope.  States his only history is diverticulitis.  Patient is alert and oriented and denies pain.

## 2016-01-08 ENCOUNTER — Ambulatory Visit (INDEPENDENT_AMBULATORY_CARE_PROVIDER_SITE_OTHER): Payer: PPO | Admitting: Family

## 2016-01-08 ENCOUNTER — Encounter: Payer: Self-pay | Admitting: Family

## 2016-01-08 ENCOUNTER — Ambulatory Visit: Payer: PPO | Admitting: Family Medicine

## 2016-01-08 VITALS — BP 140/88 | HR 78 | Temp 97.0°F | Ht 70.0 in | Wt 148.2 lb

## 2016-01-08 DIAGNOSIS — F17200 Nicotine dependence, unspecified, uncomplicated: Secondary | ICD-10-CM | POA: Diagnosis not present

## 2016-01-08 DIAGNOSIS — Z09 Encounter for follow-up examination after completed treatment for conditions other than malignant neoplasm: Secondary | ICD-10-CM | POA: Diagnosis not present

## 2016-01-08 DIAGNOSIS — R55 Syncope and collapse: Secondary | ICD-10-CM

## 2016-01-08 DIAGNOSIS — K1370 Unspecified lesions of oral mucosa: Secondary | ICD-10-CM | POA: Diagnosis not present

## 2016-01-08 LAB — BASIC METABOLIC PANEL
BUN/Creatinine Ratio: 16 (ref 10–24)
BUN: 18 mg/dL (ref 8–27)
CALCIUM: 9.5 mg/dL (ref 8.6–10.2)
CHLORIDE: 101 mmol/L (ref 96–106)
CO2: 22 mmol/L (ref 18–29)
Creatinine, Ser: 1.12 mg/dL (ref 0.76–1.27)
GFR calc non Af Amer: 69 mL/min/{1.73_m2} (ref >59)
GFR, EST AFRICAN AMERICAN: 79 mL/min/{1.73_m2} (ref >59)
Glucose: 108 mg/dL — ABNORMAL HIGH (ref 65–99)
Potassium: 5 mmol/L (ref 3.5–5.2)
Sodium: 142 mmol/L (ref 134–144)

## 2016-01-08 LAB — PSA, TOTAL AND FREE
PSA FREE PCT: 33.3 %
PSA FREE: 0.4 ng/mL
Prostate Specific Ag, Serum: 1.2 ng/mL (ref 0.0–4.0)

## 2016-01-08 LAB — HEPATITIS C ANTIBODY

## 2016-01-08 NOTE — Progress Notes (Signed)
   Subjective:    Patient ID: [name] [name], male    DOB: 1951/02/27, 65 y.o.   MRN: [id number]  HPI PT presents to the office today for hospital follow up for near syncope. PT was seen in the clinic yesterday and was started on tamsulosin for prostate problems. Pt states he took one dose and four hours later he felt hot, sweaty, vision blurry, and lightheadness. Pt went to the ED and had a positive D-Dimer, but negative CT scan to rule out PE. Pt's EKG was WNL. PT states he feels "fine" today. Pt denies any fatigue, weakness, or visual changes today.   [facility] notes were reviewed.   Review of Systems  Constitutional: Negative.   HENT: Negative.   Respiratory: Negative.   Cardiovascular: Negative.   Gastrointestinal: Negative.   Endocrine: Negative.   Genitourinary: Negative.   Musculoskeletal: Negative.   Neurological: Negative.   Hematological: Negative.   Psychiatric/Behavioral: Negative.   All other systems reviewed and are negative.      Objective:   Physical Exam  Constitutional: He is oriented to person, place, and time. He appears well-developed and well-nourished. No distress.  HENT:  Head: Normocephalic.  Right Ear: External ear normal.  Left Ear: External ear normal.  Nose: Nose normal.  Mouth/Throat: Oropharynx is clear and moist. Oral lesions (left cheek) present.  Eyes: Pupils are equal, round, and reactive to light. Right eye exhibits no discharge. Left eye exhibits no discharge.  Neck: Normal range of motion. Neck supple. No thyromegaly present.  Cardiovascular: Normal rate, regular rhythm, normal heart sounds and intact distal pulses.   No murmur heard. Pulmonary/Chest: Effort normal and breath sounds normal. No respiratory distress. He has no wheezes.  Abdominal: Soft. Bowel sounds are normal. He exhibits no distension. There is no tenderness.  Musculoskeletal: Normal range of motion. He exhibits no edema or tenderness.  Neurological: He is alert and  oriented to person, place, and time.  Skin: Skin is warm and dry. No rash noted. No erythema.  Psychiatric: He has a normal mood and affect. His behavior is normal. Judgment and thought content normal.  Vitals reviewed.     BP 140/88 mmHg  Pulse 78  Temp(Src) 97 F (36.1 C) (Oral)  Ht 5\' 10"  (1.778 m)  Wt 148 lb 3.2 oz (67.223 kg)  BMI 21.26 kg/m2     Assessment & Plan:  1. Near syncope  2. Hospital discharge follow-up  3. Oral lesion -PT states he has had this lesion for years and it is from "bitting the skin" Pt a current smoker and told if lesion changes to RTO  Smoking cessation discussed   Flomax added to allergy list, avoid medication Force fluids Keep appts with PCP Discussed importance of following up with lung nodules found on CT scan. PT states he has had the nodules for years.  RTO prn  Evelina Dun, FNP

## 2016-01-08 NOTE — Patient Instructions (Signed)

## 2016-01-11 ENCOUNTER — Telehealth: Payer: Self-pay | Admitting: *Deleted

## 2016-01-11 NOTE — Telephone Encounter (Signed)
Pt notified of results Verbalizes understanding Lipid added Okay per Dr Evette Doffing

## 2016-01-11 NOTE — Telephone Encounter (Signed)
-----   Message from Eustaquio Maize, MD sent at 01/11/2016  7:29 AM EDT ----- Please let p tknow-- Hepatitis screne negative His blood sugar level were slightly high, high enough to have pre-diabetes. Decrease sugar intake, simple carbohydrates, eat lots of fruits and veg We will check level again in a year. Prostate numbers were unchanged from last check.

## 2016-01-16 ENCOUNTER — Ambulatory Visit (HOSPITAL_COMMUNITY): Payer: PPO

## 2016-01-28 ENCOUNTER — Ambulatory Visit: Payer: PPO | Admitting: Pediatrics

## 2016-02-11 ENCOUNTER — Ambulatory Visit (HOSPITAL_COMMUNITY)
Admission: RE | Admit: 2016-02-11 | Discharge: 2016-02-11 | Disposition: A | Discharge status: Home / Self Care | Payer: PPO | Source: Ambulatory Visit | Attending: Pediatrics | Admitting: Pediatrics

## 2016-02-11 DIAGNOSIS — R6889 Other general symptoms and signs: Secondary | ICD-10-CM | POA: Diagnosis not present

## 2016-02-11 DIAGNOSIS — Z0001 Encounter for general adult medical examination with abnormal findings: Secondary | ICD-10-CM | POA: Diagnosis not present

## 2016-02-11 DIAGNOSIS — Z87891 Personal history of nicotine dependence: Secondary | ICD-10-CM | POA: Diagnosis not present

## 2016-02-11 DIAGNOSIS — Z136 Encounter for screening for cardiovascular disorders: Secondary | ICD-10-CM | POA: Diagnosis not present

## 2016-02-11 DIAGNOSIS — I77811 Abdominal aortic ectasia: Secondary | ICD-10-CM | POA: Insufficient documentation

## 2016-02-18 ENCOUNTER — Encounter: Payer: Self-pay | Admitting: Cardiology

## 2016-03-03 ENCOUNTER — Ambulatory Visit: Payer: PPO | Admitting: Cardiology

## 2016-03-24 ENCOUNTER — Encounter: Payer: Self-pay | Admitting: Pediatrics

## 2016-03-24 ENCOUNTER — Ambulatory Visit (INDEPENDENT_AMBULATORY_CARE_PROVIDER_SITE_OTHER): Payer: PPO | Admitting: Pediatrics

## 2016-03-24 VITALS — BP 136/80 | HR 85 | Temp 96.8°F | Ht 70.0 in | Wt 148.6 lb

## 2016-03-24 DIAGNOSIS — J069 Acute upper respiratory infection, unspecified: Secondary | ICD-10-CM | POA: Diagnosis not present

## 2016-03-24 DIAGNOSIS — Z1211 Encounter for screening for malignant neoplasm of colon: Secondary | ICD-10-CM | POA: Diagnosis not present

## 2016-03-24 DIAGNOSIS — J45909 Unspecified asthma, uncomplicated: Secondary | ICD-10-CM

## 2016-03-24 DIAGNOSIS — R030 Elevated blood-pressure reading, without diagnosis of hypertension: Secondary | ICD-10-CM | POA: Diagnosis not present

## 2016-03-24 DIAGNOSIS — F17200 Nicotine dependence, unspecified, uncomplicated: Secondary | ICD-10-CM | POA: Diagnosis not present

## 2016-03-24 MED ORDER — ALBUTEROL SULFATE HFA 108 (90 BASE) MCG/ACT IN AERS: 2.0000 | INHALATION_SPRAY | RESPIRATORY_TRACT | 2 refills | Status: DC | PRN

## 2016-03-24 NOTE — Progress Notes (Signed)
  Subjective:   Patient ID: [name] [name], male    DOB: 05-Jul-1950, 65 y.o.   MRN: [id number] CC: Follow-up and chest congestion  HPI: [name] [name] is a 65 y.o. male presenting for Follow-up and chest congestion  Congestion for past week Starting taking tylenol yesterday Mostly in nose No fevers, just feeling acheyness Appetite ok  No further episodes of lightheadedness/dizziness Was given apbutoerl in the ED in July for qheezing He has been taking it since he got sick in the morning, has been helping  No trouble with urination chantix gave him a headache Not interested in any help now with tobacco cessation, smokes apprx 1 ppd Truck driver  Relevant past medical, surgical, family and social history reviewed. Allergies and medications reviewed and updated. History  Smoking Status  . Current Every Day Smoker  . Packs/day: 1.00  . Years: 40.00  . Types: Cigarettes  Smokeless Tobacco  . Never Used   ROS: Per HPI   Objective:    BP 136/80   Pulse 85   Temp (!) 96.8 F (36 C) (Oral)   Ht 5\' 10"  (1.778 m)   Wt 148 lb 9.6 oz (67.4 kg)   BMI 21.32 kg/m   Wt Readings from Last 3 Encounters:  03/24/16 148 lb 9.6 oz (67.4 kg)  01/08/16 148 lb 3.2 oz (67.2 kg)  01/07/16 149 lb 3.2 oz (67.7 kg)    Gen: NAD, alert, cooperative with exam, NCAT EYES: EOMI, no conjunctival injection, or no icterus ENT:  TMs pearly gray b/l, OP with mild erythema LYMPH: no cervical LAD CV: NRRR, normal S1/S2, no murmur, distal pulses 2+ b/l Resp: CTABL, no wheezes, normal WOB Neuro: Alert and oriented, strength equal b/l UE and LE, coordination grossly normal MSK: normal muscle bulk  Assessment & Plan:  [name] [name] was seen today for follow-up and chest congestion.  Diagnoses and all orders for this visit:  Acute URI Discussed symptomatic care tylenol -     albuterol (PROVENTIL HFA;VENTOLIN HFA) 108 (90 Base) MCG/ACT inhaler; Inhale 2 puffs into the lungs every 4 (four) hours as  needed for wheezing or shortness of breath.  TOBACCO ABUSE Precontemplative, will cont to encourage  Elevated blood pressure reading without diagnosis of hypertension Improved today Per pt always better at home, in [id number] systolic  Reactive airway disease without complication, unspecified asthma severity, unspecified whether persistent albuteorl helping with his cough since he got sick Does not take regularly If he still needs it regularly after URI resolves need to start controller medicine, get PFTs -     albuterol (PROVENTIL HFA;VENTOLIN HFA) 108 (90 Base) MCG/ACT inhaler; Inhale 2 puffs into the lungs every 4 (four) hours as needed for wheezing or shortness of breath.  Colon cancer screening -     Ambulatory referral to Gastroenterology   Follow up plan: Return in about 6 months (around 09/22/2016) for med follow up. Assunta Found, MD [place]

## 2016-05-26 ENCOUNTER — Encounter: Payer: Self-pay | Admitting: Pediatrics

## 2016-07-26 ENCOUNTER — Encounter: Payer: Self-pay | Admitting: Family

## 2016-07-26 ENCOUNTER — Ambulatory Visit (INDEPENDENT_AMBULATORY_CARE_PROVIDER_SITE_OTHER): Payer: PPO | Admitting: Family

## 2016-07-26 VITALS — BP 125/77 | HR 86 | Temp 97.3°F | Ht 70.0 in | Wt 154.6 lb

## 2016-07-26 DIAGNOSIS — J029 Acute pharyngitis, unspecified: Secondary | ICD-10-CM | POA: Diagnosis not present

## 2016-07-26 MED ORDER — AZITHROMYCIN 250 MG PO TABS: ORAL_TABLET | ORAL | 0 refills | Status: DC

## 2016-07-26 NOTE — Patient Instructions (Signed)

## 2016-07-26 NOTE — Progress Notes (Signed)
   Subjective:    Patient ID: [name] [name], male    DOB: [birth year], 66 y.o.   MRN: [id number]  Headache   This is a new problem. The current episode started in the past 7 days. The problem occurs constantly. Associated symptoms include coughing and ear pain.  Sore Throat   This is a new problem. The current episode started in the past 7 days. The problem has been gradually worsening. The pain is at a severity of 7/10. The pain is moderate. Associated symptoms include congestion, coughing, ear pain, headaches, a plugged ear sensation, shortness of breath and trouble swallowing. He has tried acetaminophen and NSAIDs for the symptoms. The treatment provided mild relief.  Cough  Associated symptoms include ear pain, headaches and shortness of breath.      Review of Systems  HENT: Positive for congestion, ear pain and trouble swallowing.   Respiratory: Positive for cough and shortness of breath.   Neurological: Positive for headaches.  All other systems reviewed and are negative.      Objective:   Physical Exam  Constitutional: He is oriented to person, place, and time. He appears well-developed and well-nourished. No distress.  HENT:  Head: Normocephalic.  Right Ear: External ear normal.  Left Ear: External ear normal.  Mouth/Throat: Posterior oropharyngeal edema and posterior oropharyngeal erythema present.  Eyes: Pupils are equal, round, and reactive to light. Right eye exhibits no discharge. Left eye exhibits no discharge.  Neck: Normal range of motion. Neck supple. No thyromegaly present.  Cardiovascular: Normal rate, regular rhythm, normal heart sounds and intact distal pulses.   No murmur heard. Pulmonary/Chest: Effort normal and breath sounds normal. No respiratory distress. He has no wheezes.  Abdominal: Soft. Bowel sounds are normal. He exhibits no distension. There is no tenderness.  Musculoskeletal: Normal range of motion. He exhibits no edema or tenderness.    Neurological: He is alert and oriented to person, place, and time. He has normal reflexes. No cranial nerve deficit.  Skin: Skin is warm and dry. No rash noted. No erythema.  Psychiatric: He has a normal mood and affect. His behavior is normal. Judgment and thought content normal.  Vitals reviewed.     BP 125/77   Pulse 86   Temp 97.3 F (36.3 C) (Oral)   Ht 5\' 10"  (1.778 m)   Wt 154 lb 9.6 oz (70.1 kg)   BMI 22.18 kg/m      Assessment & Plan:  1. Acute pharyngitis, unspecified etiology - Take meds as prescribed - Use a cool mist humidifier  -Use saline nose sprays frequently -Saline irrigations of the nose can be very helpful if done frequently.  * 4X daily for 1 week*  * Use of a nettie pot can be helpful with this. Follow directions with this* -Force fluids -For any cough or congestion  Use plain Mucinex- regular strength or max strength is fine   * Children- consult with Pharmacist for dosing -For fever or aces or pains- take tylenol or ibuprofen appropriate for age and weight.  * for fevers greater than 101 orally you may alternate ibuprofen and tylenol every  3 hours. -Throat lozenges if help -New toothbrush in 3 days - azithromycin (ZITHROMAX) 250 MG tablet; Take 500 mg once, then 250 mg for four days  Dispense: 6 tablet; Refill: 0   Evelina Dun, FNP

## 2016-09-06 DIAGNOSIS — H04123 Dry eye syndrome of bilateral lacrimal glands: Secondary | ICD-10-CM | POA: Diagnosis not present

## 2016-09-06 DIAGNOSIS — H40033 Anatomical narrow angle, bilateral: Secondary | ICD-10-CM | POA: Diagnosis not present

## 2016-09-29 ENCOUNTER — Encounter (INDEPENDENT_AMBULATORY_CARE_PROVIDER_SITE_OTHER): Payer: Self-pay

## 2016-09-29 ENCOUNTER — Ambulatory Visit (INDEPENDENT_AMBULATORY_CARE_PROVIDER_SITE_OTHER): Payer: PPO | Admitting: Family Medicine

## 2016-09-29 ENCOUNTER — Encounter: Payer: Self-pay | Admitting: Family Medicine

## 2016-09-29 VITALS — BP 132/74 | HR 85 | Temp 96.6°F | Ht 70.0 in | Wt 152.2 lb

## 2016-09-29 DIAGNOSIS — J209 Acute bronchitis, unspecified: Secondary | ICD-10-CM | POA: Diagnosis not present

## 2016-09-29 MED ORDER — DOXYCYCLINE HYCLATE 100 MG PO TABS: 100.0000 mg | ORAL_TABLET | Freq: Two times a day (BID) | ORAL | 0 refills | Status: DC

## 2016-09-29 MED ORDER — METHYLPREDNISOLONE ACETATE 80 MG/ML IJ SUSP
80.0000 mg | Freq: Once | INTRAMUSCULAR | Status: AC
  Administered 2016-09-29: 80 mg via INTRAMUSCULAR

## 2016-09-29 MED ORDER — ALBUTEROL SULFATE HFA 108 (90 BASE) MCG/ACT IN AERS: 2.0000 | INHALATION_SPRAY | RESPIRATORY_TRACT | 2 refills | Status: DC | PRN

## 2016-09-29 NOTE — Addendum Note (Signed)
Addended by: Nigel Berthold C on: 09/29/2016 10:22 AM   Modules accepted: Orders

## 2016-09-29 NOTE — Patient Instructions (Addendum)
Great to see you!  Be sure to take all antibiotics  Come back with any concerns  Consider quitting smoking.

## 2016-09-29 NOTE — Progress Notes (Signed)
   HPI  Patient presents today here for cough.  Patient explains that he's had cough, severe malaise, headache, and chest congestion for about one week. He denies severe shortness of breath. He is a smoker, he is at least moderately concerned about COPD development.  He is out of albuterol. He had a similar illness in October 2017, he was sitting treated with azithromycin in February 2018 with complete resolution  PMH: Smoking status noted ROS: Per HPI  Objective: BP 132/74   Pulse 85   Temp (!) 96.6 F (35.9 C) (Oral)   Ht 5\' 10"  (1.778 m)   Wt 152 lb 3.2 oz (69 kg)   BMI 21.84 kg/m  Gen: NAD, alert, cooperative with exam HEENT: NCAT CV: RRR, good S1/S2, no murmur Resp: Expiratory wheezes throughout, some coarse breath sounds bilaterally upper lung fields, nonlabored, good air movement Abd: SNTND, BS present, no guarding or organomegaly Ext: No edema, warm Neuro: Alert and oriented, No gross deficits  Assessment and plan:  # Acute bronchitis With some concern for underlying reactive airway disease versus COPD. Quit smoking Treat with doxycycline plus IM Depo-Medrol. RTC with any concerns, consider PFTs when he is well.    Meds ordered this encounter  Medications  . doxycycline (VIBRA-TABS) 100 MG tablet    Sig: Take 1 tablet (100 mg total) by mouth 2 (two) times daily. 1 po bid    Dispense:  20 tablet    Refill:  0  . albuterol (PROVENTIL HFA;VENTOLIN HFA) 108 (90 Base) MCG/ACT inhaler    Sig: Inhale 2 puffs into the lungs every 4 (four) hours as needed for wheezing or shortness of breath.    Dispense:  1 Inhaler    Refill:  [place], MD [facility] 09/29/2016, 10:06 AM

## 2016-10-23 ENCOUNTER — Telehealth: Payer: Self-pay | Admitting: Family Medicine

## 2016-10-23 NOTE — Telephone Encounter (Signed)
Appointment made 5/7 with Ronnald Ramp.

## 2016-10-23 NOTE — Telephone Encounter (Signed)
What symptoms do you have? diverticulitis  How long have you been sick? About a week  Have you been seen for this problem? yes  If your provider decides to give you a prescription, which pharmacy would you like for it to be sent to? [facility].   Patient informed that this information will be sent to the clinical staff for review and that they should receive a follow up call.

## 2016-10-27 ENCOUNTER — Ambulatory Visit (INDEPENDENT_AMBULATORY_CARE_PROVIDER_SITE_OTHER): Payer: PPO | Admitting: Physician Assistant

## 2016-10-27 ENCOUNTER — Encounter: Payer: Self-pay | Admitting: Gastroenterology

## 2016-10-27 ENCOUNTER — Encounter: Payer: Self-pay | Admitting: Physician Assistant

## 2016-10-27 VITALS — BP 131/83 | HR 83 | Temp 96.6°F | Ht 70.0 in | Wt 149.8 lb

## 2016-10-27 DIAGNOSIS — K5904 Chronic idiopathic constipation: Secondary | ICD-10-CM | POA: Diagnosis not present

## 2016-10-27 DIAGNOSIS — K5792 Diverticulitis of intestine, part unspecified, without perforation or abscess without bleeding: Secondary | ICD-10-CM

## 2016-10-27 DIAGNOSIS — Z1211 Encounter for screening for malignant neoplasm of colon: Secondary | ICD-10-CM | POA: Diagnosis not present

## 2016-10-27 MED ORDER — AMOXICILLIN-POT CLAVULANATE 875-125 MG PO TABS: 1.0000 | ORAL_TABLET | Freq: Two times a day (BID) | ORAL | 1 refills | Status: DC

## 2016-10-27 NOTE — Progress Notes (Signed)
BP 131/83   Pulse 83   Temp (!) 96.6 F (35.9 C) (Oral)   Ht 5\' 10"  (1.778 m)   Wt 149 lb 12.8 oz (67.9 kg)   BMI 21.49 kg/m    Subjective:    Patient ID: [name] [name], male    DOB: 09/03/1950, 66 y.o.   MRN: [id number]  HPI: [name] [name] is a 66 y.o. male presenting on 10/27/2016 for Diverticulitis  For many years he has had chronic constipation and for the most part is able to take OTC stool softener/laxative and maintain daily bowel movement.  In recent days he has had a flare up of diverticulitis. He has pain in the LUQ mainly that goes down the left side. Denies fever or chills. No nausea or vomiting. Stools do not get very loose due to his constipation. He has not had a colonoscopy yet.  Relevant past medical, surgical, family and social history reviewed and updated as indicated. Allergies and medications reviewed and updated.  Past Medical History:  Diagnosis Date  . Anxiety   . CAD (coronary artery disease)    The EF was 55% with inferior hypokinesis. Cath 09  . Carotid stenosis   . Coronary artery disease    left main had luminal irregularities, the LAD had 30% stenosis, the first diagonal had moderate-sized with ostial 35% stenosis, the circumflex in the AV groove had ostial 25% stenosis, the first obtuse marginal had long proximal 30% stenosis, the mid obtuse marginal was normal, the right coronary artery was dominant and occluded proximally.  There was excellent left to right collateral filling.     . Depression   . Dyslipidemia   . GERD (gastroesophageal reflux disease)   . Lung nodule    benign  . Osteoporosis   . Tobacco abuse     Past Surgical History:  Procedure Laterality Date  . CAROTID ENDARTERECTOMY Right   . left inguinal herniorrhaphy    . THROAT SURGERY      Review of Systems  Constitutional: Negative.  Negative for appetite change and fatigue.  HENT: Negative.   Eyes: Negative.  Negative for pain and visual disturbance.    Respiratory: Negative.  Negative for cough, chest tightness, shortness of breath and wheezing.   Cardiovascular: Negative.  Negative for chest pain, palpitations and leg swelling.  Gastrointestinal: Positive for abdominal distention, abdominal pain and constipation. Negative for blood in stool, diarrhea, nausea and vomiting.  Endocrine: Negative.   Genitourinary: Negative.   Musculoskeletal: Negative.   Skin: Negative.  Negative for color change and rash.  Neurological: Negative.  Negative for weakness, numbness and headaches.  Psychiatric/Behavioral: Negative.     Allergies as of 10/27/2016      Reactions   Crestor [rosuvastatin Calcium] Other (See Comments)   Lethargy and muscle aches   Flomax [tamsulosin Hcl] Other (See Comments)   Made patient hypotensive, clammy, sweaty, and feel like he was going to pass out (EMS HAD TO BE CALLED)   Lipitor [atorvastatin Calcium] Other (See Comments)   Body cramps, lethargy and muscle aches      Medication List       Accurate as of 10/27/16 11:42 AM. Always use your most recent med list.          albuterol 108 (90 Base) MCG/ACT inhaler Commonly known as:  PROVENTIL HFA;VENTOLIN HFA Inhale 2 puffs into the lungs every 4 (four) hours as needed for wheezing or shortness of breath.   amoxicillin-clavulanate 875-125 MG tablet Commonly known  as:  AUGMENTIN Take 1 tablet by mouth 2 (two) times daily.   aspirin EC 81 MG tablet Take 81 mg by mouth daily.   fish oil-omega-3 fatty acids 1000 MG capsule Take 1,000 mg by mouth daily.   MULTI VITAMIN MENS tablet Take 1 tablet by mouth daily.   VITAMIN D-3 PO Take 1 capsule by mouth daily.          Objective:    BP 131/83   Pulse 83   Temp (!) 96.6 F (35.9 C) (Oral)   Ht 5\' 10"  (1.778 m)   Wt 149 lb 12.8 oz (67.9 kg)   BMI 21.49 kg/m   Allergies  Allergen Reactions  . Crestor [Rosuvastatin Calcium] Other (See Comments)    Lethargy and muscle aches  . Flomax [Tamsulosin Hcl]  Other (See Comments)    Made patient hypotensive, clammy, sweaty, and feel like he was going to pass out (EMS HAD TO BE CALLED)  . Lipitor [Atorvastatin Calcium] Other (See Comments)    Body cramps, lethargy and muscle aches    Physical Exam  Constitutional: He appears well-developed and well-nourished. No distress.  HENT:  Head: Normocephalic and atraumatic.  Eyes: Conjunctivae and EOM are normal. Pupils are equal, round, and reactive to light.  Cardiovascular: Normal rate, regular rhythm and normal heart sounds.   Pulmonary/Chest: Effort normal and breath sounds normal. No respiratory distress.  Abdominal: Normal appearance. Bowel sounds are increased. There is no hepatosplenomegaly. There is tenderness in the left upper quadrant and left lower quadrant. There is no rebound and no CVA tenderness.  Skin: Skin is warm and dry.  Psychiatric: He has a normal mood and affect. His behavior is normal.  Nursing note and vitals reviewed.       Assessment & Plan:   1. Diverticulitis - amoxicillin-clavulanate (AUGMENTIN) 875-125 MG tablet; Take 1 tablet by mouth 2 (two) times daily.  Dispense: 20 tablet; Refill: 1 - Ambulatory referral to Gastroenterology  2. Screening for colon cancer - Ambulatory referral to Gastroenterology  3. Chronic idiopathic constipation - Ambulatory referral to Gastroenterology   Current Outpatient Prescriptions:  .  albuterol (PROVENTIL HFA;VENTOLIN HFA) 108 (90 Base) MCG/ACT inhaler, Inhale 2 puffs into the lungs every 4 (four) hours as needed for wheezing or shortness of breath., Disp: 1 Inhaler, Rfl: 2 .  aspirin EC 81 MG tablet, Take 81 mg by mouth daily., Disp: , Rfl:  .  Cholecalciferol (VITAMIN D-3 PO), Take 1 capsule by mouth daily. , Disp: , Rfl:  .  fish oil-omega-3 fatty acids 1000 MG capsule, Take 1,000 mg by mouth daily. , Disp: , Rfl:  .  Multiple Vitamin (MULTI VITAMIN MENS) tablet, Take 1 tablet by mouth daily., Disp: , Rfl:  .   amoxicillin-clavulanate (AUGMENTIN) 875-125 MG tablet, Take 1 tablet by mouth 2 (two) times daily., Disp: 20 tablet, Rfl: 1  Continue all other maintenance medications as listed above.  Follow up plan: Return if symptoms worsen or fail to improve.  Educational handout given for diverticulitis  Terald Sleeper PA-C [facility] [address]  [place], [place] [address] [phone]   10/27/2016, 11:42 AM

## 2016-10-27 NOTE — Patient Instructions (Signed)

## 2016-11-14 ENCOUNTER — Ambulatory Visit: Payer: PPO

## 2017-01-05 ENCOUNTER — Ambulatory Visit (AMBULATORY_SURGERY_CENTER): Payer: Self-pay

## 2017-01-05 VITALS — Ht 71.0 in | Wt 148.0 lb

## 2017-01-05 DIAGNOSIS — Z1211 Encounter for screening for malignant neoplasm of colon: Secondary | ICD-10-CM

## 2017-01-05 DIAGNOSIS — R300 Dysuria: Secondary | ICD-10-CM | POA: Diagnosis not present

## 2017-01-05 DIAGNOSIS — R399 Unspecified symptoms and signs involving the genitourinary system: Secondary | ICD-10-CM | POA: Diagnosis not present

## 2017-01-05 MED ORDER — NA SULFATE-K SULFATE-MG SULF 17.5-3.13-1.6 GM/177ML PO SOLN: 1.0000 | Freq: Once | ORAL | 0 refills | Status: AC

## 2017-01-05 NOTE — Progress Notes (Signed)
Denies allergies to eggs or soy products. Denies complication of anesthesia or sedation. Denies use of weight loss medication. Denies use of O2.   Emmi instructions given for colonoscopy.  

## 2017-01-12 ENCOUNTER — Encounter: Payer: Self-pay | Admitting: Gastroenterology

## 2017-01-19 ENCOUNTER — Ambulatory Visit (AMBULATORY_SURGERY_CENTER): Payer: PPO | Admitting: Gastroenterology

## 2017-01-19 ENCOUNTER — Encounter: Payer: Self-pay | Admitting: Gastroenterology

## 2017-01-19 VITALS — BP 117/61 | HR 65 | Temp 96.6°F | Resp 14 | Ht 71.0 in | Wt 148.0 lb

## 2017-01-19 DIAGNOSIS — Z1211 Encounter for screening for malignant neoplasm of colon: Secondary | ICD-10-CM

## 2017-01-19 DIAGNOSIS — D126 Benign neoplasm of colon, unspecified: Secondary | ICD-10-CM | POA: Diagnosis not present

## 2017-01-19 DIAGNOSIS — D120 Benign neoplasm of cecum: Secondary | ICD-10-CM

## 2017-01-19 DIAGNOSIS — K635 Polyp of colon: Secondary | ICD-10-CM

## 2017-01-19 DIAGNOSIS — D122 Benign neoplasm of ascending colon: Secondary | ICD-10-CM

## 2017-01-19 DIAGNOSIS — Z800 Family history of malignant neoplasm of digestive organs: Secondary | ICD-10-CM | POA: Diagnosis not present

## 2017-01-19 DIAGNOSIS — D123 Benign neoplasm of transverse colon: Secondary | ICD-10-CM

## 2017-01-19 DIAGNOSIS — Z1212 Encounter for screening for malignant neoplasm of rectum: Secondary | ICD-10-CM

## 2017-01-19 HISTORY — PX: COLONOSCOPY: SHX174

## 2017-01-19 MED ORDER — SODIUM CHLORIDE 0.9 % IV SOLN: 500.0000 mL | INTRAVENOUS | Status: DC

## 2017-01-19 NOTE — Progress Notes (Signed)
Called to room to assist during endoscopic procedure.  Patient ID and intended procedure confirmed with present staff. Received instructions for my participation in the procedure from the performing physician.  

## 2017-01-19 NOTE — Op Note (Signed)
[facility] Patient Name: [name] [name] Procedure Date: 01/19/2017 9:11 AM MRN: [id number] Endoscopist: Remo Lipps P. Charlita Brian MD, MD Age: 66 Referring MD:  Date of Birth: 06-18-1951 Gender: Male Account #: [id number] Procedure:                Colonoscopy Indications:              Screening in patient at increased risk: Family                            history of 1st-degree relative with colorectal                            cancer (brother age 10s), history of                            diverticulitis, here for first time colonoscopy Medicines:                Monitored Anesthesia Care Procedure:                Pre-Anesthesia Assessment:                           - Prior to the procedure, a History and Physical                            was performed, and patient medications and                            allergies were reviewed. The patient's tolerance of                            previous anesthesia was also reviewed. The risks                            and benefits of the procedure and the sedation                            options and risks were discussed with the patient.                            All questions were answered, and informed consent                            was obtained. Prior Anticoagulants: The patient has                            taken aspirin, last dose was 1 day prior to                            procedure. ASA Grade Assessment: III - A patient                            with severe systemic disease. After reviewing the  risks and benefits, the patient was deemed in                            satisfactory condition to undergo the procedure.                           After obtaining informed consent, the colonoscope                            was passed under direct vision. Throughout the                            procedure, the patient's blood pressure, pulse, and                            oxygen saturations  were monitored continuously. The                            Colonoscope was introduced through the anus and                            advanced to the the terminal ileum, with                            identification of the appendiceal orifice and IC                            valve. The colonoscopy was performed without                            difficulty. The patient tolerated the procedure                            well. The quality of the bowel preparation was                            good. The terminal ileum, ileocecal valve,                            appendiceal orifice, and rectum were photographed. Scope In: 9:23:45 AM Scope Out: 10:03:53 AM Scope Withdrawal Time: 0 hours 36 minutes 19 seconds  Total Procedure Duration: 0 hours 40 minutes 8 seconds  Findings:                 The perianal and digital rectal examinations were                            normal.                           The terminal ileum appeared normal.                           A 15 mm polyp was found in the ileocecal valve -  wrapped around from the outside to the inside                            aspect of the valve and was technically challenging                            to visualize it completely. The polyp was sessile,                            and did not appear to invade the ileum upon ileal                            intubation. The polyp was removed with a piecemeal                            technique using a cold snare. Resection and                            retrieval were thought to have been complete                            although was difficult to visualize the back side                            of the IC valve.                           Two sessile polyps were found in the ascending                            colon. The polyps were 5 to 10 mm in size. These                            polyps were removed with a cold snare. Resection                             and retrieval were complete.                           A roughly 20-25 mm laterally spreading polyp was                            found in the ascending colon. The polyp was                            sessile. The polyp was removed with a piecemeal                            technique using a cold snare, it was somewhat                            technically difficult to get good positioning to  remove certain portions of the polyp, which led to                            prolonged polypectomy. Resection and retrieval were                            thought to have been complete. It was located just                            distal to the valve along the opposite wall of the                            valve - tattoo not placed.                           Two sessile polyps were found in the transverse                            colon. The polyps were 5 to 6 mm in size. These                            polyps were removed with a cold snare. Resection                            and retrieval were complete.                           Many medium-mouthed diverticula were found in the                            sigmoid colon.                           Internal hemorrhoids were found during retroflexion.                           The exam was otherwise without abnormality. Complications:            No immediate complications. Estimated blood loss:                            Minimal. Estimated Blood Loss:     Estimated blood loss was minimal. Impression:               - The examined portion of the ileum was normal.                           - One 15 mm polyp at the ileocecal valve, removed                            piecemeal using a cold snare. Resected and                            retrieved.                           -  Two 5 to 10 mm polyps in the ascending colon,                            removed with a cold snare. Resected and retrieved.                            - One 20-25 mm polyp in the ascending colon,                            removed piecemeal using a cold snare. Resected and                            retrieved.                           - Two 5 to 6 mm polyps in the transverse colon,                            removed with a cold snare. Resected and retrieved.                           - Diverticulosis in the sigmoid colon.                           - Internal hemorrhoids.                           - The examination was otherwise normal. Recommendation:           - Patient has a contact number available for                            emergencies. The signs and symptoms of potential                            delayed complications were discussed with the                            patient. Return to normal activities tomorrow.                            Written discharge instructions were provided to the                            patient.                           - Resume previous diet.                           - Continue present medications.                           - Await pathology results.                           -  Repeat colonoscopy is recommended for                            surveillance in 3 months to re-evaluate the IC                            valve polyp and the largest ascending colon polyp -                            ensure no residual polyp tissue                           - No ibuprofen, naproxen, or other non-steroidal                            anti-inflammatory drugs for 2 weeks after polyp                            removal. Remo Lipps P. Skilynn Durney MD, MD 01/19/2017 12:31:30 PM This report has been signed electronically.

## 2017-01-19 NOTE — Progress Notes (Signed)
Alert and oriented x3, pleased with MAC, report to RN  

## 2017-01-19 NOTE — Patient Instructions (Signed)
YOU HAD AN ENDOSCOPIC PROCEDURE TODAY AT [facility]:   Refer to the procedure report that was given to you for any specific questions about what was found during the examination.  If the procedure report does not answer your questions, please call your gastroenterologist to clarify.  If you requested that your care partner not be given the details of your procedure findings, then the procedure report has been included in a sealed envelope for you to review at your convenience later.  YOU SHOULD EXPECT: Some feelings of bloating in the abdomen. Passage of more gas than usual.  Walking can help get rid of the air that was put into your GI tract during the procedure and reduce the bloating. If you had a lower endoscopy (such as a colonoscopy or flexible sigmoidoscopy) you may notice spotting of blood in your stool or on the toilet paper. If you underwent a bowel prep for your procedure, you may not have a normal bowel movement for a few days.  Please Note:  You might notice some irritation and congestion in your nose or some drainage.  This is from the oxygen used during your procedure.  There is no need for concern and it should clear up in a day or so.  SYMPTOMS TO REPORT IMMEDIATELY:   Following lower endoscopy (colonoscopy or flexible sigmoidoscopy):  Excessive amounts of blood in the stool  Significant tenderness or worsening of abdominal pains  Swelling of the abdomen that is new, acute  Fever of 100F or higher   Following upper endoscopy (EGD)  Vomiting of blood or coffee ground material  New chest pain or pain under the shoulder blades  Painful or persistently difficult swallowing  New shortness of breath  Fever of 100F or higher  Black, tarry-looking stools  For urgent or emergent issues, a gastroenterologist can be reached at any hour by calling [phone].   DIET:  We do recommend a small meal at first, but then you may proceed to your regular diet.  Drink  plenty of fluids but you should avoid alcoholic beverages for 24 hours.  ACTIVITY:  You should plan to take it easy for the rest of today and you should NOT DRIVE or use heavy machinery until tomorrow (because of the sedation medicines used during the test).    FOLLOW UP: Our staff will call the number listed on your records the next business day following your procedure to check on you and address any questions or concerns that you may have regarding the information given to you following your procedure. If we do not reach you, we will leave a message.  However, if you are feeling well and you are not experiencing any problems, there is no need to return our call.  We will assume that you have returned to your regular daily activities without incident.  If any biopsies were taken you will be contacted by phone or by letter within the next 1-3 weeks.  Please call us at [phone] if you have not heard about the biopsies in 3 weeks.   Await for biopsy report to determine next repeat Colonoscopy screening No ibuprofen, naproxen or other non-steroidal anti-inflammatory drugs for 2 weeks after polyp removal. Tylenol okay if needed. Polyps (handout given) Diverticulosis (handout given) Hemorrhoids (handout given)  SIGNATURES/CONFIDENTIALITY: You and/or your care partner have signed paperwork which will be entered into your electronic medical record.  These signatures attest to the fact that that the information above on your  After Visit Summary has been reviewed and is understood.  Full responsibility of the confidentiality of this discharge information lies with you and/or your care-partner.

## 2017-01-19 NOTE — Progress Notes (Signed)
Pt's states no medical or surgical changes since previsit or office visit.  No egg or soy allergy  

## 2017-01-20 ENCOUNTER — Telehealth: Payer: Self-pay | Admitting: *Deleted

## 2017-01-20 NOTE — Telephone Encounter (Signed)
  Follow up Call-  Call back number 01/19/2017  Post procedure Call Back phone  # [phone]  Permission to leave phone message Yes  Some recent data might be hidden     Patient questions:  Do you have a fever, pain , or abdominal swelling? No. Pain Score  0 *  Have you tolerated food without any problems? Yes.    Have you been able to return to your normal activities? Yes.    Do you have any questions about your discharge instructions: Diet   No. Medications  No. Follow up visit  No.  Do you have questions or concerns about your Care? No.  Actions: * If pain score is 4 or above: No action needed, pain <4.

## 2017-01-23 ENCOUNTER — Encounter: Payer: Self-pay | Admitting: Gastroenterology

## 2017-05-11 ENCOUNTER — Encounter: Payer: Self-pay | Admitting: Gastroenterology

## 2017-05-26 ENCOUNTER — Ambulatory Visit (INDEPENDENT_AMBULATORY_CARE_PROVIDER_SITE_OTHER): Payer: PPO | Admitting: Family Medicine

## 2017-05-26 ENCOUNTER — Encounter: Payer: Self-pay | Admitting: Family Medicine

## 2017-05-26 DIAGNOSIS — R05 Cough: Secondary | ICD-10-CM

## 2017-05-26 DIAGNOSIS — R059 Cough, unspecified: Secondary | ICD-10-CM

## 2017-05-26 MED ORDER — AZITHROMYCIN 250 MG PO TABS: ORAL_TABLET | ORAL | 0 refills | Status: DC

## 2017-05-26 MED ORDER — FLUTICASONE PROPIONATE 50 MCG/ACT NA SUSP: 2.0000 | Freq: Every day | NASAL | 0 refills | Status: DC

## 2017-05-26 MED ORDER — ALBUTEROL SULFATE HFA 108 (90 BASE) MCG/ACT IN AERS: 2.0000 | INHALATION_SPRAY | RESPIRATORY_TRACT | 2 refills | Status: DC | PRN

## 2017-05-26 NOTE — Addendum Note (Signed)
Addended by: Timmothy Euler on: 05/26/2017 10:09 AM   Modules accepted: Orders

## 2017-05-26 NOTE — Patient Instructions (Signed)
Great to see you!   Acute Bronchitis, Adult Acute bronchitis is when air tubes (bronchi) in the lungs suddenly get swollen. The condition can make it hard to breathe. It can also cause these symptoms:  A cough.  Coughing up clear, yellow, or green mucus.  Wheezing.  Chest congestion.  Shortness of breath.  A fever.  Body aches.  Chills.  A sore throat.  Follow these instructions at home: Medicines  Take over-the-counter and prescription medicines only as told by your doctor.  If you were prescribed an antibiotic medicine, take it as told by your doctor. Do not stop taking the antibiotic even if you start to feel better. General instructions  Rest.  Drink enough fluids to keep your pee (urine) clear or pale yellow.  Avoid smoking and secondhand smoke. If you smoke and you need help quitting, ask your doctor. Quitting will help your lungs heal faster.  Use an inhaler, cool mist vaporizer, or humidifier as told by your doctor.  Keep all follow-up visits as told by your doctor. This is important. How is this prevented? To lower your risk of getting this condition again:  Wash your hands often with soap and water. If you cannot use soap and water, use hand sanitizer.  Avoid contact with people who have cold symptoms.  Try not to touch your hands to your mouth, nose, or eyes.  Make sure to get the flu shot every year.  Contact a doctor if:  Your symptoms do not get better in 2 weeks. Get help right away if:  You cough up blood.  You have chest pain.  You have very bad shortness of breath.  You become dehydrated.  You faint (pass out) or keep feeling like you are going to pass out.  You keep throwing up (vomiting).  You have a very bad headache.  Your fever or chills gets worse. This information is not intended to replace advice given to you by your health care provider. Make sure you discuss any questions you have with your health care  provider. Document Released: 11/26/2007 Document Revised: 01/16/2016 Document Reviewed: 11/28/2015 Elsevier Interactive Patient Education  2017 Elsevier Inc.  

## 2017-05-26 NOTE — Progress Notes (Addendum)
   HPI  Patient presents today here with acute illness.  Patient's states that he is had 1 day of cough, congestion, headache, sore throat, and left ear pain.  For about 1 week patient has had significant nasal congestion  Patient tolerating food and fluids like usual.  He is a smoker, not interested in quitting at this time.  He does normally respond well to albuterol, however he is almost out.  PMH: Smoking status noted ROS: Per HPI  Objective: BP (!) 152/87   Pulse 84   Temp (!) 96.7 F (35.9 C) (Oral)   Ht 5\' 11"  (1.803 m)   Wt 147 lb 3.2 oz (66.8 kg)   SpO2 98%   BMI 20.53 kg/m  Gen: NAD, alert, cooperative with exam HEENT: NCAT, oropharynx moist with mild erythema, nares with swollen turbinates bilaterally, TMs normal bilaterally, no tenderness to palpation of bilateral sinuses CV: RRR, good S1/S2, no murmur Resp: Nonlabored, faint wheezes throughout Abd: SNTND, BS present, no guarding or organomegaly Ext: No edema, warm Neuro: Alert and oriented, No gross deficits  Assessment and plan:  #Cough Treating with azithromycin, 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  He will have limited availability of medical care after that. Possible underlying COPD He is also a smoker He is also had a milder illness now with acute worsening Albuterol refilled Exam reasurring   Meds ordered this encounter  Medications  . azithromycin (ZITHROMAX) 250 MG tablet    Sig: Take 2 tablets on day 1 and 1 tablet daily after that    Dispense:  6 tablet    Refill:  0  . albuterol (PROVENTIL HFA;VENTOLIN HFA) 108 (90 Base) MCG/ACT inhaler    Sig: Inhale 2 puffs into the lungs every 4 (four) hours as needed for wheezing or shortness of breath.    Dispense:  1 Inhaler    Refill:  2  . fluticasone (FLONASE) 50 MCG/ACT nasal spray    Sig: Place 2 sprays into both nostrils daily.    Dispense:  16 g    Refill:  [place], MD [facility] 05/26/2017, 10:09 AM

## 2017-06-29 ENCOUNTER — Ambulatory Visit (INDEPENDENT_AMBULATORY_CARE_PROVIDER_SITE_OTHER): Payer: PPO | Admitting: *Deleted

## 2017-06-29 ENCOUNTER — Encounter: Payer: Self-pay | Admitting: *Deleted

## 2017-06-29 ENCOUNTER — Ambulatory Visit (INDEPENDENT_AMBULATORY_CARE_PROVIDER_SITE_OTHER): Payer: PPO

## 2017-06-29 VITALS — BP 158/89 | HR 78 | Ht 70.0 in | Wt 153.0 lb

## 2017-06-29 DIAGNOSIS — Z0000 Encounter for general adult medical examination without abnormal findings: Secondary | ICD-10-CM

## 2017-06-29 DIAGNOSIS — Z23 Encounter for immunization: Secondary | ICD-10-CM

## 2017-06-29 DIAGNOSIS — M810 Age-related osteoporosis without current pathological fracture: Secondary | ICD-10-CM | POA: Diagnosis not present

## 2017-06-29 DIAGNOSIS — M8588 Other specified disorders of bone density and structure, other site: Secondary | ICD-10-CM

## 2017-06-29 DIAGNOSIS — M8589 Other specified disorders of bone density and structure, multiple sites: Secondary | ICD-10-CM

## 2017-06-29 NOTE — Progress Notes (Signed)
Subjective:   [name] [name] is a 67 y.o. male who presents for an Initial Medicare Annual Wellness Visit. 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 They have 4 adult children and many grandchildren. He also spent 4 years in the [employer].   Review of Systems  Health is about the same as last year.   Cardiac Risk Factors include: advanced age (>38men, >97 women);smoking/ tobacco exposure;sedentary lifestyle;male gender  Musculoskeletal: arthralgias hands and knees. Burning pain Left lateral ankle/foot that sometimes radiates upwards. Pain is tolerable and doesn't affect daily activities.    Other systems negative.    Objective:    Today's Vitals   06/29/17 0859  BP: (!) 158/89  Pulse: 78  Weight: 153 lb (69.4 kg)  Height: 5\' 10"  (1.778 m)  PainSc: 5    Body mass index is 21.95 kg/m.  Advanced Directives 06/29/2017 01/19/2017 01/05/2017 01/07/2016 03/07/2013  Does Patient Have a Medical Advance Directive? No No No No Patient does not have advance directive  Would patient like information on creating a medical advance directive? No - Patient declined - - - -  Pre-existing out of facility DNR order (yellow form or pink MOST form) - - - - No    Current Medications (verified) Outpatient Encounter Medications as of 06/29/2017  Medication Sig  . albuterol (PROVENTIL HFA;VENTOLIN HFA) 108 (90 Base) MCG/ACT inhaler Inhale 2 puffs into the lungs every 4 (four) hours as needed for wheezing or shortness of breath.  Marland Kitchen aspirin EC 81 MG tablet Take 81 mg by mouth daily.  . Cholecalciferol (VITAMIN D-3 PO) Take 1 capsule by mouth daily.   . fish oil-omega-3 fatty acids 1000 MG capsule Take 1,000 mg by mouth daily.   . Multiple Vitamin (MULTI VITAMIN MENS) tablet Take 1 tablet by mouth daily.  Marland Kitchen azithromycin (ZITHROMAX) 250 MG tablet Take 2 tablets on day 1 and 1 tablet daily after that  . fluticasone (FLONASE) 50 MCG/ACT nasal  spray Place 2 sprays into both nostrils daily.   Facility-Administered Encounter Medications as of 06/29/2017  Medication  . 0.9 %  sodium chloride infusion    Allergies (verified) Crestor [rosuvastatin calcium]; Flomax [tamsulosin hcl]; and Lipitor [atorvastatin calcium]   History: Past Medical History:  Diagnosis Date  . Allergy   . Anxiety    Patient denies  . Arthritis   . CAD (coronary artery disease)    The EF was 55% with inferior hypokinesis. Cath 09  . Carotid stenosis   . Coronary artery disease    left main had luminal irregularities, the LAD had 30% stenosis, the first diagonal had moderate-sized with ostial 35% stenosis, the circumflex in the AV groove had ostial 25% stenosis, the first obtuse marginal had long proximal 30% stenosis, the mid obtuse marginal was normal, the right coronary artery was dominant and occluded proximally.  There was excellent left to right collateral filling.     . Depression    Patient denies  . Diverticulitis   . Dyslipidemia   . GERD (gastroesophageal reflux disease)   . Hyperlipidemia   . Lung nodule    benign  . Osteoporosis   . Tobacco abuse    Past Surgical History:  Procedure Laterality Date  . CAROTID ENDARTERECTOMY Right   . COLONOSCOPY    . left inguinal herniorrhaphy    . THROAT SURGERY     Family History  Problem Relation Age of  Onset  . Lung disease Father   . Lupus Mother   . Lupus Brother   . Cancer Brother        colon  . Lung disease Brother   . Healthy Daughter   . Esophageal cancer Neg Hx   . Rectal cancer Neg Hx   . Stomach cancer Neg Hx   . Pancreatic cancer Neg Hx    Social History   Socioeconomic History  . Marital status: Married    Spouse name: [name]  . Number of children: 4  . Years of education: high school diploma  . Highest education level: 12th grade  Social Needs  . Financial resource strain: Not hard at all  . Food insecurity - worry: Never true  . Food insecurity - inability: Never  true  . Transportation needs - medical: No  . Transportation needs - non-medical: No  Occupational History  . Occupation: truck Education administrator: [employer]  Tobacco Use  . Smoking status: Current Every Day Smoker    Packs/day: 1.00    Years: 40.00    Pack years: 40.00    Types: Cigarettes  . Smokeless tobacco: Never Used  Substance and Sexual Activity  . Alcohol use: Yes    Comment: rarely  . Drug use: No  . Sexual activity: None  Other Topics Concern  . None  Social History Narrative   Lives with wife in a split level home.    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   Has 1 daughter, 3 sons, and many grandchildren.   Tobacco Counseling Ready to quit: No Counseling given: Yes   Clinical Intake:    Pain : 0-10 Pain Score: 5  Pain Type: Chronic pain Pain Location: Generalized(Left lateral ankle pain that is a stinging pain that radiates up and arthritis in hands and knees) Pain Orientation: Left Pain Radiating Towards: Upwards from Left heel through leg Pain Descriptors / Indicators: Burning, Aching Pain Onset: More than a month ago Pain Frequency: Constant Effect of Pain on Daily Activities: Very little     Nutritional Status: BMI of 19-24  Normal Diabetes: No  How often do you need to have someone help you when you read instructions, pamphlets, or other written materials from your doctor or pharmacy?: 1 - Never What is the last grade level you completed in school?: finished high school education while in the [employer]?: No     Activities of Daily Living In your present state of health, do you have any difficulty performing the following activities: 06/29/2017  Hearing? N  Vision? N  Comment Eye exam is up to date. Last exam was last with Anthony Sar, OD  Difficulty concentrating or making decisions? N  Walking or climbing stairs? N  Dressing or bathing? N  Doing errands, shopping? N  Preparing Food and eating ? N  Using  the Toilet? N  In the past six months, have you accidently leaked urine? N  Do you have problems with loss of bowel control? N  Managing your Medications? N  Managing your Finances? N  Housekeeping or managing your Housekeeping? N  Some recent data might be hidden     Immunizations and Health Maintenance Immunization History  Administered Date(s) Administered  . Pneumococcal Conjugate-13 01/07/2016  . Pneumococcal Polysaccharide-23 06/29/2017   Health Maintenance Due  Topic Date Due  . Samul Dada  12/14/1969    Patient Care Team: Timmothy Euler, MD as PCP - General (Family  Medicine)  Indicate any recent Medical Services you may have received from other than [facility] providers in the past year (date may be approximate).    Assessment:   This is a routine wellness examination for [place].  Hearing/Vision screen No deficits noted during visit. Eye exam is up to date.   Dietary issues and exercise activities discussed: Current Exercise Habits: The patient does not participate in regular exercise at present, Exercise limited by: None identified  Goals    . Exercise 150 min/wk Moderate Activity    . Quit Smoking      Depression Screen PHQ 2/9 Scores 06/29/2017 05/26/2017 10/27/2016 09/29/2016  PHQ - 2 Score 0 1 0 0    Fall Risk Fall Risk  06/29/2017 05/26/2017 10/27/2016 09/29/2016 07/26/2016  Falls in the past year? No No No No No    Is the patient's home free of loose throw rugs in walkways, pet beds, electrical cords, etc?   yes      Grab bars in the bathroom? no      Handrails on the stairs?   yes      Adequate lighting?   yes   Cognitive Function:    MMSE - Mini Mental State Exam 06/29/2017  Orientation to time 5  Orientation to Place 5  Registration 3  Attention/ Calculation 3  Recall 3  Language- name 2 objects 2  Language- repeat 1  Language- follow 3 step command 3  Language- read & follow direction 1  Write a sentence 1  Copy design 1  Total score 28   Normal  exam    Screening Tests Health Maintenance  Topic Date Due  . TETANUS/TDAP  12/14/1969  . INFLUENZA VACCINE  01/26/2018 (Originally 01/21/2017)  . COLONOSCOPY  01/20/2027  . Hepatitis C Screening  Completed  . PNA vac Low Risk Adult  Completed        Plan:   Pneumovax given today Dexa scan done today Increase activity level. Aim for 150 min of moderate activity weekly Stop smoking Keep f/u with PCP  I have personally reviewed and noted the following in the patient's chart:   . Medical and social history . Use of alcohol, tobacco or illicit drugs  . Current medications and supplements . Functional ability and status . Nutritional status . Physical activity . Advanced directives . List of other physicians . Hospitalizations, surgeries, and ER visits in previous 12 months . Vitals . Screenings to include cognitive, depression, and falls . Referrals and appointments  In addition, I have reviewed and discussed with patient certain preventive protocols, quality metrics, and best practice recommendations. A written personalized care plan for preventive services as well as general preventive health recommendations were provided to patient.     Chong Sicilian, RN   06/29/2017     I have reviewed and agree with the above AWV documentation.   Laroy Apple, MD [facility] 06/29/2017, 1:26 PM

## 2017-06-29 NOTE — Patient Instructions (Addendum)
[name] [name] , Thank you for taking time to come for your Medicare Wellness Visit. I appreciate your ongoing commitment to your health goals. Please review the following plan we discussed and let me know if I can assist you in the future.   These are the goals we discussed: Goals    . Exercise 150 min/wk Moderate Activity    . Quit Smoking       This is a list of the screening recommended for you and due dates:  Adult vaccines due  Topic Date Due  . TETANUS/TDAP  12/14/1969   Health Maintenance  Topic Date Due  . Samul Dada  12/14/1969  . INFLUENZA VACCINE  01/26/2018 (Originally 01/21/2017)  . COLONOSCOPY  01/20/2027  . Hepatitis C Screening  Completed  . PNA vac Low Risk Adult  Completed     Pneumococcal Polysaccharide Vaccine: What You Need to Know 1. Why get vaccinated? Vaccination can protect older adults (and some children and younger adults) from pneumococcal disease. Pneumococcal disease is caused by bacteria that can spread from person to person through close contact. It can cause ear infections, and it can also lead to more serious infections of the:  Lungs (pneumonia),  Blood (bacteremia), and  Covering of the brain and spinal cord (meningitis). Meningitis can cause deafness and brain damage, and it can be fatal.  Anyone can get pneumococcal disease, but children under 52 years of age, people with certain medical conditions, adults over 21 years of age, and cigarette smokers are at the highest risk. About 18,000 older adults die each year from pneumococcal disease in the [place]. Treatment of pneumococcal infections with penicillin and other drugs used to be more effective. But some strains of the disease have become resistant to these drugs. This makes prevention of the disease, through vaccination, even more important. 2. Pneumococcal polysaccharide vaccine (PPSV23) Pneumococcal polysaccharide vaccine (PPSV23) protects against 23 types of pneumococcal  bacteria. It will not prevent all pneumococcal disease. PPSV23 is recommended for:  All adults 71 years of age and older,  Anyone 2 through 67 years of age with certain long-term health problems,  Anyone 2 through 67 years of age with a weakened immune system,  Adults 8 through 67 years of age who smoke cigarettes or have asthma.  Most people need only one dose of PPSV. A second dose is recommended for certain high-risk groups. People 53 and older should get a dose even if they have gotten one or more doses of the vaccine before they turned 65. Your healthcare provider can give you more information about these recommendations. Most healthy adults develop protection within 2 to 3 weeks of getting the shot. 3. Some people should not get this vaccine  Anyone who has had a life-threatening allergic reaction to PPSV should not get another dose.  Anyone who has a severe allergy to any component of PPSV should not receive it. Tell your provider if you have any severe allergies.  Anyone who is moderately or severely ill when the shot is scheduled may be asked to wait until they recover before getting the vaccine. Someone with a mild illness can usually be vaccinated.  Children less than 16 years of age should not receive this vaccine.  There is no evidence that PPSV is harmful to either a pregnant woman or to her fetus. However, as a precaution, women who need the vaccine should be vaccinated before becoming pregnant, if possible. 4. Risks of a vaccine reaction With any medicine, including vaccines,  there is a chance of side effects. These are usually mild and go away on their own, but serious reactions are also possible. About half of people who get PPSV have mild side effects, such as redness or pain where the shot is given, which go away within about two days. Less than 1 out of 100 people develop a fever, muscle aches, or more severe local reactions. Problems that could happen after any  vaccine:  People sometimes faint after a medical procedure, including vaccination. Sitting or lying down for about 15 minutes can help prevent fainting, and injuries caused by a fall. Tell your doctor if you feel dizzy, or have vision changes or ringing in the ears.  Some people get severe pain in the shoulder and have difficulty moving the arm where a shot was given. This happens very rarely.  Any medication can cause a severe allergic reaction. Such reactions from a vaccine are very rare, estimated at about 1 in a million doses, and would happen within a few minutes to a few hours after the vaccination. As with any medicine, there is a very remote chance of a vaccine causing a serious injury or death. The safety of vaccines is always being monitored. For more information, visit: http://www.aguilar.org/ 5. What if there is a serious reaction? What should I look for? Look for anything that concerns you, such as signs of a severe allergic reaction, very high fever, or unusual behavior. Signs of a severe allergic reaction can include hives, swelling of the face and throat, difficulty breathing, a fast heartbeat, dizziness, and weakness. These would usually start a few minutes to a few hours after the vaccination. What should I do? If you think it is a severe allergic reaction or other emergency that can't wait, call 9-1-1 or get to the nearest hospital. Otherwise, call your doctor. Afterward, the reaction should be reported to the Vaccine Adverse Event Reporting System (VAERS). Your doctor might file this report, or you can do it yourself through the VAERS web site at www.vaers.SamedayNews.es, or by calling 904-110-1433. VAERS does not give medical advice. 6. How can I learn more?  Ask your doctor. He or she can give you the vaccine package insert or suggest other sources of information.  Call your local or state health department.  Contact the Centers for Disease Control and Prevention  (CDC): ? Call 910 738 1996 (1-800-CDC-INFO) or ? Visit CDC's website at http://hunter.com/ CDC Pneumococcal Polysaccharide Vaccine VIS (10/14/13) This information is not intended to replace advice given to you by your health care provider. Make sure you discuss any questions you have with your health care provider. Document Released: 04/06/2006 Document Revised: 02/28/2016 Document Reviewed: 02/28/2016 Elsevier Interactive Patient Education  2017 [employer].

## 2017-07-13 ENCOUNTER — Ambulatory Visit (INDEPENDENT_AMBULATORY_CARE_PROVIDER_SITE_OTHER): Payer: PPO | Admitting: Family Medicine

## 2017-07-13 ENCOUNTER — Encounter: Payer: Self-pay | Admitting: Family Medicine

## 2017-07-13 VITALS — BP 163/89 | HR 84 | Temp 96.8°F | Ht 70.0 in | Wt 152.2 lb

## 2017-07-13 DIAGNOSIS — M810 Age-related osteoporosis without current pathological fracture: Secondary | ICD-10-CM | POA: Diagnosis not present

## 2017-07-13 DIAGNOSIS — K5792 Diverticulitis of intestine, part unspecified, without perforation or abscess without bleeding: Secondary | ICD-10-CM | POA: Diagnosis not present

## 2017-07-13 DIAGNOSIS — E039 Hypothyroidism, unspecified: Secondary | ICD-10-CM

## 2017-07-13 DIAGNOSIS — Z7689 Persons encountering health services in other specified circumstances: Secondary | ICD-10-CM | POA: Diagnosis not present

## 2017-07-13 DIAGNOSIS — E038 Other specified hypothyroidism: Secondary | ICD-10-CM

## 2017-07-13 DIAGNOSIS — E785 Hyperlipidemia, unspecified: Secondary | ICD-10-CM

## 2017-07-13 MED ORDER — AMOXICILLIN-POT CLAVULANATE 875-125 MG PO TABS: 1.0000 | ORAL_TABLET | Freq: Three times a day (TID) | ORAL | 0 refills | Status: DC

## 2017-07-13 NOTE — Patient Instructions (Signed)
We will work on prolia for you   Diverticulitis Diverticulitis is when small pockets in your large intestine (colon) get infected or swollen. This causes stomach pain and watery poop (diarrhea). These pouches are called diverticula. They form in people who have a condition called diverticulosis. Follow these instructions at home: Medicines  Take over-the-counter and prescription medicines only as told by your doctor. These include: ? Antibiotics. ? Pain medicines. ? Fiber pills. ? Probiotics. ? Stool softeners.  Do not drive or use heavy machinery while taking prescription pain medicine.  If you were prescribed an antibiotic, take it as told. Do not stop taking it even if you feel better. General instructions  Follow a diet as told by your doctor.  When you feel better, your doctor may tell you to change your diet. You may need to eat a lot of fiber. Fiber makes it easier to poop (have bowel movements). Healthy foods with fiber include: ? Berries. ? Beans. ? Lentils. ? Green vegetables.  Exercise 3 or more times a week. Aim for 30 minutes each time. Exercise enough to sweat and make your heart beat faster.  Keep all follow-up visits as told. This is important. You may need to have an exam of the large intestine. This is called a colonoscopy. Contact a doctor if:  Your pain does not get better.  You have a hard time eating or drinking.  You are not pooping like normal. Get help right away if:  Your pain gets worse.  Your problems do not get better.  Your problems get worse very fast.  You have a fever.  You throw up (vomit) more than one time.  You have poop that is: ? Bloody. ? Black. ? Tarry. Summary  Diverticulitis is when small pockets in your large intestine (colon) get infected or swollen.  Take medicines only as told by your doctor.  Follow a diet as told by your doctor. This information is not intended to replace advice given to you by your health  care provider. Make sure you discuss any questions you have with your health care provider. Document Released: 11/26/2007 Document Revised: 06/26/2016 Document Reviewed: 06/26/2016 Elsevier Interactive Patient Education  2017 [employer].

## 2017-07-13 NOTE — Progress Notes (Signed)
   HPI  Patient presents today discussed DEXA scan results as well as left lower quadrant pain.  Patient has history of diverticulitis and states that his left lower quadrant pain is consistent with diverticulitis flare. He states this started about 2-3 days ago after eating sandwich on the road. No fever, chills, sweats.  He is tolerating food and fluids like usual.  DEXA scan Patient states he has family history of osteoporosis and multiple family members including meals. He is interested in Spray. He already takes supplement of calcium, he is unaware of what the doses. He does eat one serving of milk and 1-2 servings of cheese daily.   PMH: Smoking status noted ROS: Per HPI  Objective: BP (!) 163/89   Pulse 84   Temp (!) 96.8 F (36 C) (Oral)   Ht '5\' 10"'$  (1.778 m)   Wt 152 lb 3.2 oz (69 kg)   BMI 21.84 kg/m  Gen: NAD, alert, cooperative with exam HEENT: NCAT CV: RRR, good S1/S2, no murmur Resp: CTABL, no wheezes, non-labored Ext: No edema, warm Neuro: Alert and oriented, No gross deficits  Assessment and plan:  #Osteoporosis T score -3.1 Patient is already taking calcium supplementation he gets about 600 mg of calcium dietary daily. No change in supplement, however I would like him to bring in details next visit Gentle calcium replacement given known cardiac calcium as well as carotid calcification Prolia recommended, we will pursue arranging this  #Diverticulitis Mild, exam is reassuring Patient with characteristic pain Augmentin 3 times daily  Subclinical hypothyroidism-asymptomatic, labs  Hyperlipidemia Repeat labs- non fasting  Orders Placed This Encounter  Procedures  . CMP14+EGFR  . CBC with Differential/Platelet  . Lipid panel  . TSH  . VITAMIN D 25 Hydroxy (Vit-D Deficiency, Fractures)    Meds ordered this encounter  Medications  . amoxicillin-clavulanate (AUGMENTIN) 875-125 MG tablet    Sig: Take 1 tablet by mouth 3 (three) times daily.      Dispense:  30 tablet    Refill:  0    TID for diverticulitis    Laroy Apple, MD [place] 07/13/2017, 9:55 AM

## 2017-07-14 LAB — CBC WITH DIFFERENTIAL/PLATELET
Basophils Absolute: 0 10*3/uL (ref 0.0–0.2)
Basos: 0 %
EOS (ABSOLUTE): 0.1 10*3/uL (ref 0.0–0.4)
Eos: 2 %
Hematocrit: 48.5 % (ref 37.5–51.0)
Hemoglobin: 16.3 g/dL (ref 13.0–17.7)
Immature Grans (Abs): 0 10*3/uL (ref 0.0–0.1)
Immature Granulocytes: 0 %
LYMPHS ABS: 4 10*3/uL — AB (ref 0.7–3.1)
Lymphs: 44 %
MCH: 31.1 pg (ref 26.6–33.0)
MCHC: 33.6 g/dL (ref 31.5–35.7)
MCV: 93 fL (ref 79–97)
MONOS ABS: 0.6 10*3/uL (ref 0.1–0.9)
Monocytes: 7 %
NEUTROS ABS: 4.2 10*3/uL (ref 1.4–7.0)
Neutrophils: 47 %
PLATELETS: 217 10*3/uL (ref 150–379)
RBC: 5.24 x10E6/uL (ref 4.14–5.80)
RDW: 13.9 % (ref 12.3–15.4)
WBC: 8.9 10*3/uL (ref 3.4–10.8)

## 2017-07-14 LAB — CMP14+EGFR
ALT: 13 IU/L (ref 0–44)
AST: 19 IU/L (ref 0–40)
Albumin/Globulin Ratio: 1.4 (ref 1.2–2.2)
Albumin: 4.2 g/dL (ref 3.6–4.8)
Alkaline Phosphatase: 124 IU/L — ABNORMAL HIGH (ref 39–117)
BUN/Creatinine Ratio: 13 (ref 10–24)
BUN: 15 mg/dL (ref 8–27)
Bilirubin Total: 0.4 mg/dL (ref 0.0–1.2)
CALCIUM: 9.3 mg/dL (ref 8.6–10.2)
CO2: 25 mmol/L (ref 20–29)
CREATININE: 1.16 mg/dL (ref 0.76–1.27)
Chloride: 103 mmol/L (ref 96–106)
GFR calc Af Amer: 75 mL/min/{1.73_m2} (ref >59)
GFR calc non Af Amer: 65 mL/min/{1.73_m2} (ref >59)
GLUCOSE: 128 mg/dL — AB (ref 65–99)
Globulin, Total: 2.9 g/dL (ref 1.5–4.5)
POTASSIUM: 5.2 mmol/L (ref 3.5–5.2)
Sodium: 143 mmol/L (ref 134–144)
TOTAL PROTEIN: 7.1 g/dL (ref 6.0–8.5)

## 2017-07-14 LAB — VITAMIN D 25 HYDROXY (VIT D DEFICIENCY, FRACTURES): VIT D 25 HYDROXY: 44.9 ng/mL (ref 30.0–100.0)

## 2017-07-14 LAB — LIPID PANEL
CHOLESTEROL TOTAL: 206 mg/dL — AB (ref 100–199)
Chol/HDL Ratio: 4.1 ratio (ref 0.0–5.0)
HDL: 50 mg/dL (ref >39)
LDL CALC: 136 mg/dL — AB (ref 0–99)
Triglycerides: 102 mg/dL (ref 0–149)
VLDL Cholesterol Cal: 20 mg/dL (ref 5–40)

## 2017-07-14 LAB — TSH: TSH: 3.95 u[IU]/mL (ref 0.450–4.500)

## 2017-07-15 LAB — HGB A1C W/O EAG: HEMOGLOBIN A1C: 5.9 % — AB (ref 4.8–5.6)

## 2017-07-15 LAB — SPECIMEN STATUS REPORT

## 2017-07-30 ENCOUNTER — Ambulatory Visit (INDEPENDENT_AMBULATORY_CARE_PROVIDER_SITE_OTHER): Payer: PPO | Admitting: Family Medicine

## 2017-07-30 ENCOUNTER — Encounter: Payer: Self-pay | Admitting: Family Medicine

## 2017-07-30 VITALS — BP 147/79 | HR 82 | Temp 97.2°F | Ht 70.0 in | Wt 149.2 lb

## 2017-07-30 DIAGNOSIS — I2510 Atherosclerotic heart disease of native coronary artery without angina pectoris: Secondary | ICD-10-CM

## 2017-07-30 DIAGNOSIS — R079 Chest pain, unspecified: Secondary | ICD-10-CM | POA: Diagnosis not present

## 2017-07-30 MED ORDER — METOPROLOL SUCCINATE ER 25 MG PO TB24: 25.0000 mg | ORAL_TABLET | Freq: Every day | ORAL | 3 refills | Status: DC

## 2017-07-30 NOTE — Progress Notes (Signed)
   HPI  Patient presents today with chest pain.  Patient states that it started on Sunday, he describes left-sided chest heaviness with radiation to his left shoulder at times.  He states that today it resolved but it continues to come and go.  He states that it comes and goes without exertion or any other obvious aggravating factors.  It lasted all day Sunday and began to improve the following day.  His wife states that he is under a lot of stress. He has had a heart catheterization previously finding nonobstructive CAD, he has had statin intolerance x2-3 different agents.   PMH: Smoking status noted ROS: Per HPI  Objective: BP (!) 147/79   Pulse 82   Temp (!) 97.2 F (36.2 C) (Oral)   Ht 5\' 10"  (1.778 m)   Wt 149 lb 3.2 oz (67.7 kg)   BMI 21.41 kg/m   Gen: NAD, alert, cooperative with exam HEENT: NCAT CV: RRR, good S1/S2, no murmur Chest wall -tenderness to palpation of left-sided costochondral joint, with significant pain but not true reproduction of pain Resp: CTABL, no wheezes, non-labored Ext: No edema, warm Neuro: Alert and oriented, No gross deficits  Assessment and plan:  #Chest pain With known nonobstructive CAD, I recommend seeing cardiology soon as possible. EKG pending currently Start beta-blocker He does have chest wall tenderness to palpation, however it is no true reproduction of pain so I think it is most prudent to go ahead and have him evaluated by cardiology as well. Risk factors include smoking, hyperlipidemia, family history, prediabetes He works doing long drives in a truck, recommended stopping until cardiology can see  EKG- Baseline artifact but [place], MD [facility] 07/30/2017, 1:30 PM

## 2017-07-30 NOTE — Patient Instructions (Signed)
Great to see you!   Nonspecific Chest Pain Chest pain can be caused by many different conditions. There is a chance that your pain could be related to something serious, such as a heart attack or a blood clot in your lungs. Chest pain can also be caused by conditions that are not life-threatening. If you have chest pain, it is very important to follow up with your doctor. Follow these instructions at home: Medicines  If you were prescribed an antibiotic medicine, take it as told by your doctor. Do not stop taking the antibiotic even if you start to feel better.  Take over-the-counter and prescription medicines only as told by your doctor. Lifestyle  Do not use any products that contain nicotine or tobacco, such as cigarettes and e-cigarettes. If you need help quitting, ask your doctor.  Do not drink alcohol.  Make lifestyle changes as told by your doctor. These may include: ? Getting regular exercise. Ask your doctor for some activities that are safe for you. ? Eating a heart-healthy diet. A diet specialist (dietitian) can help you to learn healthy eating options. ? Staying at a healthy weight. ? Managing diabetes, if needed. ? Lowering your stress, as with deep breathing or spending time in nature. General instructions  Avoid any activities that make you feel chest pain.  If your chest pain is because of heartburn: ? Raise (elevate) the head of your bed about 6 inches (15 cm). You can do this by putting blocks under the bed legs at the head of the bed. ? Do not sleep with extra pillows under your head. That does not help heartburn.  Keep all follow-up visits as told by your doctor. This is important. This includes any further testing if your chest pain does not go away. Contact a doctor if:  Your chest pain does not go away.  You have a rash with blisters on your chest.  You have a fever.  You have chills. Get help right away if:  Your chest pain is worse.  You have a  cough that gets worse, or you cough up blood.  You have very bad (severe) pain in your belly (abdomen).  You are very weak.  You pass out (faint).  You have either of these for no clear reason: ? Sudden chest discomfort. ? Sudden discomfort in your arms, back, neck, or jaw.  You have shortness of breath at any time.  You suddenly start to sweat, or your skin gets clammy.  You feel sick to your stomach (nauseous).  You throw up (vomit).  You suddenly feel light-headed or dizzy.  Your heart starts to beat fast, or it feels like it is skipping beats. These symptoms may be an emergency. Do not wait to see if the symptoms will go away. Get medical help right away. Call your local emergency services (911 in the [place] Do not drive yourself to the hospital. This information is not intended to replace advice given to you by your health care provider. Make sure you discuss any questions you have with your health care provider. Document Released: 11/26/2007 Document Revised: 03/03/2016 Document Reviewed: 03/03/2016 Elsevier Interactive Patient Education  2017 [employer].

## 2017-08-02 ENCOUNTER — Encounter: Payer: Self-pay | Admitting: Cardiology

## 2017-08-02 NOTE — H&P (View-Only) (Signed)
Cardiology Office Note   Date:  08/03/2017   ID:  [name], DOB [birth year], MRN [id number]  PCP:  Timmothy Euler, MD  Cardiologist:   No primary care provider on file. Referring:  Timmothy Euler, MD  Chief Complaint  Patient presents with  . Chest Pain      History of Present Illness: [name] [name] is a 67 y.o. male who was referred by Timmothy Euler, MD for evaluation of chest pain.  I saw him in 2013.  He had non obstructive CAD.  I last saw him in 2013.  He is referred back because 8 days ago he started having chest discomfort.  This is a heaviness like bricks on his chest.  It was severe Sunday when it started.  It lasted all day.  It was 9 out of 10 in intensity.  He has had some ongoing discomfort with that since then.  He points left of his sternum under his left breast.  He is not had any jaw or arm pain.  He did not have any associated symptoms such as nausea vomiting or diaphoresis.  He thought it might of been reflux but it was much more intense.  He has not been particularly active since that time but he has not noticed any reproducible symptoms.  The pain just seems to be there but not as severe as it was again 8 days ago.  He has chronic dyspnea on exertion.  He does not have PND or orthopnea.  He does not notice any palpitations, presyncope or syncope.  Unfortunately he continues to smoke cigarettes.   Past Medical History:  Diagnosis Date  . Allergy   . Anxiety    Patient denies  . Arthritis   . CAD (coronary artery disease)    The EF was 55% with inferior hypokinesis. Cath 09  . Carotid stenosis   . Coronary artery disease    left main had luminal irregularities, the LAD had 30% stenosis, the first diagonal had moderate-sized with ostial 35% stenosis, the circumflex in the AV groove had ostial 25% stenosis, the first obtuse marginal had long proximal 30% stenosis, the mid obtuse marginal was normal, the right coronary artery was dominant  and occluded proximally.  There was excellent left to right collateral filling.     . Depression    Patient denies  . Diverticulitis   . Dyslipidemia   . GERD (gastroesophageal reflux disease)   . Lung nodule    benign  . Osteoporosis   . Tobacco abuse     Past Surgical History:  Procedure Laterality Date  . CAROTID ENDARTERECTOMY Right   . COLONOSCOPY    . left inguinal herniorrhaphy    . THROAT SURGERY       Current Outpatient Medications  Medication Sig Dispense Refill  . albuterol (PROVENTIL HFA;VENTOLIN HFA) 108 (90 Base) MCG/ACT inhaler Inhale 2 puffs into the lungs every 4 (four) hours as needed for wheezing or shortness of breath. 1 Inhaler 2  . aspirin EC 81 MG tablet Take 81 mg by mouth daily.    . calcium carbonate (CALCIUM 600) 600 MG TABS tablet Take 600 mg by mouth daily with breakfast.    . Cholecalciferol (VITAMIN D-3 PO) Take 20 mg by mouth daily.     . fexofenadine (ALLEGRA) 180 MG tablet Take 180 mg by mouth daily.    . Flaxseed, Linseed, (FLAXSEED OIL) 1000 MG CAPS Take 1 capsule by mouth daily.    Marland Kitchen  fluticasone (FLONASE) 50 MCG/ACT nasal spray Place 2 sprays into both nostrils daily. 16 g 0  . metoprolol succinate (TOPROL-XL) 25 MG 24 hr tablet Take 1 tablet (25 mg total) by mouth daily. 90 tablet 3  . Multiple Vitamin (MULTI VITAMIN MENS) tablet Take 1 tablet by mouth daily.    Marland Kitchen omeprazole (PRILOSEC) 20 MG capsule Take 20 mg by mouth daily.     Current Facility-Administered Medications  Medication Dose Route Frequency Provider Last Rate Last Dose  . 0.9 %  sodium chloride infusion  500 mL Intravenous Continuous Armbruster, Carlota Raspberry, MD        Allergies:   Crestor [rosuvastatin calcium]; Flomax [tamsulosin hcl]; and Lipitor [atorvastatin calcium]    Social History:  The patient  reports that he has been smoking cigarettes.  He has a 40.00 pack-year smoking history. he has never used smokeless tobacco. He reports that he drinks alcohol. He reports that  he does not use drugs.   Family History:  The patient's family history includes Cancer in his brother; Healthy in his daughter; Lung disease in his brother and father; Lupus in his brother and mother.    ROS:  Please see the history of present illness.   Otherwise, review of systems are positive for none.   All other systems are reviewed and negative.    PHYSICAL EXAM: VS:  BP (!) 150/82   Pulse 70   Ht 5\' 10"  (1.778 m)   Wt 148 lb (67.1 kg)   BMI 21.24 kg/m  , BMI Body mass index is 21.24 kg/m. GENERAL:  Well appearing, dentures HEENT:  Pupils equal round and reactive, fundi not visualized, oral mucosa unremarkable NECK:  No jugular venous distention, waveform within normal limits, carotid upstroke brisk and symmetric, no bruits, no thyromegaly LYMPHATICS:  No cervical, inguinal adenopathy LUNGS:  Clear to auscultation bilaterally BACK:  No CVA tenderness CHEST:  Unremarkable HEART:  PMI not displaced or sustained,S1 and S2 within normal limits, no S3, no S4, no clicks, no rubs, soft apical systolic murmurs ABD:  Flat, positive bowel sounds normal in frequency in pitch, no bruits, no rebound, no guarding, no midline pulsatile mass, no hepatomegaly, no splenomegaly EXT:  2 plus pulses throughout, no edema, no cyanosis no clubbing SKIN:  No rashes no nodules NEURO:  Cranial nerves II through XII grossly intact, motor grossly intact throughout PSYCH:  Cognitively intact, oriented to person place and time    EKG:  EKG is not ordered today. The ekg ordered 07/30/17 demonstrates sinus rhythm, baseline artifact, axis within normal limits, intervals within normal limits, unable to assess P wave axis, no acute ST-T wave changes.   Recent Labs: 07/13/2017: ALT 13; BUN 15; Creatinine, Ser 1.16; Hemoglobin 16.3; Platelets 217; Potassium 5.2; Sodium 143; TSH 3.950    Lipid Panel    Component Value Date/Time   CHOL 206 (H) 07/13/2017 0930   TRIG 102 07/13/2017 0930   HDL 50 07/13/2017  0930   CHOLHDL 4.1 07/13/2017 0930   CHOLHDL 5.5 03/08/2013 0509   VLDL 22 03/08/2013 0509   LDLCALC 136 (H) 07/13/2017 0930      Wt Readings from Last 3 Encounters:  08/03/17 148 lb (67.1 kg)  07/30/17 149 lb 3.2 oz (67.7 kg)  07/13/17 152 lb 3.2 oz (69 kg)      Other studies Reviewed: Additional studies/ records that were reviewed today include: Labs, EKG. Review of the above records demonstrates:  Please see elsewhere in the note.  ASSESSMENT AND PLAN:  CHEST PAIN: I am concerned that this is unstable angina not well treated risk factors.  The pretest probability of obstructive coronary disease is high.  He has had known occlusive disease in the past medically managed.  Cardiac catheter is indicated. The patient understands that risks included but are not limited to stroke (1 in 1000), death (1 in 110), kidney failure [usually temporary] (1 in 500), bleeding (1 in 200), allergic reaction [possibly serious] (1 in 200).  The patient understands and agrees to proceed.   HTN:   He was just started on beta-blocker.  He might need further med titration but I will do this based on home blood pressure readings.  He says his blood pressure is well controlled at home.  DYSLIPIDEMIA: He has not tolerated statins but agrees to try Livalo.  If he does not tolerate this I would suggest the lipid clinic for possible PCSK9  TOBACCO:   Chantix did not work.  He will call [phone]   Current medicines are reviewed at length with the patient today.  The patient does not have concerns regarding medicines.  The following changes have been made:  As above  Labs/ tests ordered today include:   Orders Placed This Encounter  Procedures  . CBC  . Basic Metabolic Panel (BMET)  . TSH  . INR/PT  . APTT     Disposition:   FU with me after the cath.     Signed, Minus Breeding, MD  08/03/2017 3:24 PM    [facility]

## 2017-08-02 NOTE — Progress Notes (Signed)
Cardiology Office Note   Date:  08/03/2017   ID:  [name] [name], DOB May 24, 1951, MRN [id number]  PCP:  Timmothy Euler, MD  Cardiologist:   No primary care provider on file. Referring:  Timmothy Euler, MD  Chief Complaint  Patient presents with  . Chest Pain      History of Present Illness: [name] [name] is a 67 y.o. male who was referred by Timmothy Euler, MD for evaluation of chest pain.  I saw him in 2013.  He had non obstructive CAD.  I last saw him in 2013.  He is referred back because 8 days ago he started having chest discomfort.  This is a heaviness like bricks on his chest.  It was severe Sunday when it started.  It lasted all day.  It was 9 out of 10 in intensity.  He has had some ongoing discomfort with that since then.  He points left of his sternum under his left breast.  He is not had any jaw or arm pain.  He did not have any associated symptoms such as nausea vomiting or diaphoresis.  He thought it might of been reflux but it was much more intense.  He has not been particularly active since that time but he has not noticed any reproducible symptoms.  The pain just seems to be there but not as severe as it was again 8 days ago.  He has chronic dyspnea on exertion.  He does not have PND or orthopnea.  He does not notice any palpitations, presyncope or syncope.  Unfortunately he continues to smoke cigarettes.   Past Medical History:  Diagnosis Date  . Allergy   . Anxiety    Patient denies  . Arthritis   . CAD (coronary artery disease)    The EF was 55% with inferior hypokinesis. Cath 09  . Carotid stenosis   . Coronary artery disease    left main had luminal irregularities, the LAD had 30% stenosis, the first diagonal had moderate-sized with ostial 35% stenosis, the circumflex in the AV groove had ostial 25% stenosis, the first obtuse marginal had long proximal 30% stenosis, the mid obtuse marginal was normal, the right coronary artery was dominant  and occluded proximally.  There was excellent left to right collateral filling.     . Depression    Patient denies  . Diverticulitis   . Dyslipidemia   . GERD (gastroesophageal reflux disease)   . Lung nodule    benign  . Osteoporosis   . Tobacco abuse     Past Surgical History:  Procedure Laterality Date  . CAROTID ENDARTERECTOMY Right   . COLONOSCOPY    . left inguinal herniorrhaphy    . THROAT SURGERY       Current Outpatient Medications  Medication Sig Dispense Refill  . albuterol (PROVENTIL HFA;VENTOLIN HFA) 108 (90 Base) MCG/ACT inhaler Inhale 2 puffs into the lungs every 4 (four) hours as needed for wheezing or shortness of breath. 1 Inhaler 2  . aspirin EC 81 MG tablet Take 81 mg by mouth daily.    . calcium carbonate (CALCIUM 600) 600 MG TABS tablet Take 600 mg by mouth daily with breakfast.    . Cholecalciferol (VITAMIN D-3 PO) Take 20 mg by mouth daily.     . fexofenadine (ALLEGRA) 180 MG tablet Take 180 mg by mouth daily.    . Flaxseed, Linseed, (FLAXSEED OIL) 1000 MG CAPS Take 1 capsule by mouth daily.    Marland Kitchen  fluticasone (FLONASE) 50 MCG/ACT nasal spray Place 2 sprays into both nostrils daily. 16 g 0  . metoprolol succinate (TOPROL-XL) 25 MG 24 hr tablet Take 1 tablet (25 mg total) by mouth daily. 90 tablet 3  . Multiple Vitamin (MULTI VITAMIN MENS) tablet Take 1 tablet by mouth daily.    Marland Kitchen omeprazole (PRILOSEC) 20 MG capsule Take 20 mg by mouth daily.     Current Facility-Administered Medications  Medication Dose Route Frequency Provider Last Rate Last Dose  . 0.9 %  sodium chloride infusion  500 mL Intravenous Continuous Armbruster, Carlota Raspberry, MD        Allergies:   Crestor [rosuvastatin calcium]; Flomax [tamsulosin hcl]; and Lipitor [atorvastatin calcium]    Social History:  The patient  reports that he has been smoking cigarettes.  He has a 40.00 pack-year smoking history. he has never used smokeless tobacco. He reports that he drinks alcohol. He reports that  he does not use drugs.   Family History:  The patient's family history includes Cancer in his brother; Healthy in his daughter; Lung disease in his brother and father; Lupus in his brother and mother.    ROS:  Please see the history of present illness.   Otherwise, review of systems are positive for none.   All other systems are reviewed and negative.    PHYSICAL EXAM: VS:  BP (!) 150/82   Pulse 70   Ht 5\' 10"  (1.778 m)   Wt 148 lb (67.1 kg)   BMI 21.24 kg/m  , BMI Body mass index is 21.24 kg/m. GENERAL:  Well appearing, dentures HEENT:  Pupils equal round and reactive, fundi not visualized, oral mucosa unremarkable NECK:  No jugular venous distention, waveform within normal limits, carotid upstroke brisk and symmetric, no bruits, no thyromegaly LYMPHATICS:  No cervical, inguinal adenopathy LUNGS:  Clear to auscultation bilaterally BACK:  No CVA tenderness CHEST:  Unremarkable HEART:  PMI not displaced or sustained,S1 and S2 within normal limits, no S3, no S4, no clicks, no rubs, soft apical systolic murmurs ABD:  Flat, positive bowel sounds normal in frequency in pitch, no bruits, no rebound, no guarding, no midline pulsatile mass, no hepatomegaly, no splenomegaly EXT:  2 plus pulses throughout, no edema, no cyanosis no clubbing SKIN:  No rashes no nodules NEURO:  Cranial nerves II through XII grossly intact, motor grossly intact throughout PSYCH:  Cognitively intact, oriented to person place and time    EKG:  EKG is not ordered today. The ekg ordered 07/30/17 demonstrates sinus rhythm, baseline artifact, axis within normal limits, intervals within normal limits, unable to assess P wave axis, no acute ST-T wave changes.   Recent Labs: 07/13/2017: ALT 13; BUN 15; Creatinine, Ser 1.16; Hemoglobin 16.3; Platelets 217; Potassium 5.2; Sodium 143; TSH 3.950    Lipid Panel    Component Value Date/Time   CHOL 206 (H) 07/13/2017 0930   TRIG 102 07/13/2017 0930   HDL 50 07/13/2017  0930   CHOLHDL 4.1 07/13/2017 0930   CHOLHDL 5.5 03/08/2013 0509   VLDL 22 03/08/2013 0509   LDLCALC 136 (H) 07/13/2017 0930      Wt Readings from Last 3 Encounters:  08/03/17 148 lb (67.1 kg)  07/30/17 149 lb 3.2 oz (67.7 kg)  07/13/17 152 lb 3.2 oz (69 kg)      Other studies Reviewed: Additional studies/ records that were reviewed today include: Labs, EKG. Review of the above records demonstrates:  Please see elsewhere in the note.  ASSESSMENT AND PLAN:  CHEST PAIN: I am concerned that this is unstable angina not well treated risk factors.  The pretest probability of obstructive coronary disease is high.  He has had known occlusive disease in the past medically managed.  Cardiac catheter is indicated. The patient understands that risks included but are not limited to stroke (1 in 1000), death (1 in 83), kidney failure [usually temporary] (1 in 500), bleeding (1 in 200), allergic reaction [possibly serious] (1 in 200).  The patient understands and agrees to proceed.   HTN:   He was just started on beta-blocker.  He might need further med titration but I will do this based on home blood pressure readings.  He says his blood pressure is well controlled at home.  DYSLIPIDEMIA: He has not tolerated statins but agrees to try Livalo.  If he does not tolerate this I would suggest the lipid clinic for possible PCSK9  TOBACCO:   Chantix did not work.  He will call [phone]   Current medicines are reviewed at length with the patient today.  The patient does not have concerns regarding medicines.  The following changes have been made:  As above  Labs/ tests ordered today include:   Orders Placed This Encounter  Procedures  . CBC  . Basic Metabolic Panel (BMET)  . TSH  . INR/PT  . APTT     Disposition:   FU with me after the cath.     Signed, Minus Breeding, MD  08/03/2017 3:24 PM    [facility]

## 2017-08-03 ENCOUNTER — Ambulatory Visit: Payer: PPO | Admitting: Cardiology

## 2017-08-03 ENCOUNTER — Encounter: Payer: Self-pay | Admitting: Cardiology

## 2017-08-03 VITALS — BP 150/82 | HR 70 | Ht 70.0 in | Wt 148.0 lb

## 2017-08-03 DIAGNOSIS — I200 Unstable angina: Secondary | ICD-10-CM | POA: Diagnosis not present

## 2017-08-03 DIAGNOSIS — Z01812 Encounter for preprocedural laboratory examination: Secondary | ICD-10-CM | POA: Diagnosis not present

## 2017-08-03 DIAGNOSIS — D689 Coagulation defect, unspecified: Secondary | ICD-10-CM

## 2017-08-03 DIAGNOSIS — I2510 Atherosclerotic heart disease of native coronary artery without angina pectoris: Secondary | ICD-10-CM | POA: Diagnosis not present

## 2017-08-03 DIAGNOSIS — R5383 Other fatigue: Secondary | ICD-10-CM | POA: Diagnosis not present

## 2017-08-03 NOTE — Patient Instructions (Signed)
Medication Instructions:  START- Livalo 2 mg daily  If you need a refill on your cardiac medications before your next appointment, please call your pharmacy.  Labwork: Pre Op Labs Today HERE IN OUR OFFICE AT [facility]  Take the provided lab slips for you to take with you to the lab for you blood draw.   You will NOT need to fast   You may go to any [facility] lab that is convenient for you however, we do have a lab in our office that is able to assist you. You do NOT need an appointment for our lab. Once in our office lobby there is a podium to the right of the check-in desk where you are to sign-in and ring a doorbell to alert [place] you are here. Lab is open Monday-Friday from 8:00am to 4:00pm; and is closed for lunch from 12:45p-1:45pm   Testing/Procedures: Your physician has requested that you have a cardiac catheterization. Cardiac catheterization is used to diagnose and/or treat various heart conditions. Doctors may recommend this procedure for a number of different reasons. The most common reason is to evaluate chest pain. Chest pain can be a symptom of coronary artery disease (CAD), and cardiac catheterization can show whether plaque is narrowing or blocking your heart's arteries. This procedure is also used to evaluate the valves, as well as measure the blood flow and oxygen levels in different parts of your heart. For further information please visit [email or web address]. Please follow instruction sheet, as given.  Special Instructions:   [facility] [address] [place] [address] Dept: [phone] Loc: [place]  08/03/2017  You are scheduled for a Cardiac Catheterization on Tuesday, February 12 with Dr. Lauree Chandler.  1. Please arrive at the [facility] (Main Entrance A) at [facility]: [address] [place], [place] [address] at 11:00 AM (two hours  before your procedure to ensure your preparation). Free valet parking service is available.   Special note: Every effort is made to have your procedure done on time. Please understand that emergencies sometimes delay scheduled procedures.  2. Diet: Do not eat or drink anything after midnight prior to your procedure except sips of water to take medications.  3. Labs: Today  4. Medication instructions in preparation for your procedure:     Current Outpatient Medications (Cardiovascular):  .  metoprolol succinate (TOPROL-XL) 25 MG 24 hr tablet, Take 1 tablet (25 mg total) by mouth daily.   Current Outpatient Medications (Respiratory):  .  albuterol (PROVENTIL HFA;VENTOLIN HFA) 108 (90 Base) MCG/ACT inhaler, Inhale 2 puffs into the lungs every 4 (four) hours as needed for wheezing or shortness of breath. .  fexofenadine (ALLEGRA) 180 MG tablet, Take 180 mg by mouth daily. .  fluticasone (FLONASE) 50 MCG/ACT nasal spray, Place 2 sprays into both nostrils daily.   Current Outpatient Medications (Analgesics):  .  aspirin EC 81 MG tablet, Take 81 mg by mouth daily.     Current Outpatient Medications (Other):  .  calcium carbonate (CALCIUM 600) 600 MG TABS tablet, Take 600 mg by mouth daily with breakfast. .  Cholecalciferol (VITAMIN D-3 PO), Take 20 mg by mouth daily.  .  Flaxseed, Linseed, (FLAXSEED OIL) 1000 MG CAPS, Take 1 capsule by mouth daily. .  Multiple Vitamin (MULTI VITAMIN MENS) tablet, Take 1 tablet by mouth daily. Marland Kitchen  omeprazole (PRILOSEC) 20 MG capsule, Take 20 mg by mouth daily.  Current Facility-Administered  Medications (Other):  .  0.9 %  sodium chloride infusion *For reference purposes while preparing patient instructions.   Delete this med list prior to printing instructions for patient.*   On the morning of your procedure, take your Aspirin and any morning medicines NOT listed above.  You may use sips of water.  5. Plan for one night stay--bring personal  belongings. 6. Bring a current list of your medications and current insurance cards. 7. You MUST have a responsible person to drive you home. 8. Someone MUST be with you the first 24 hours after you arrive home or your discharge will be delayed. 9. Please wear clothes that are easy to get on and off and wear slip-on shoes.  Thank you for allowing [place] to care for you!   -- [facility]   Follow-Up: Your physician wants you to follow-up in: After Cath.     Thank you for choosing [facility] at [facility]!!

## 2017-08-04 ENCOUNTER — Ambulatory Visit (HOSPITAL_COMMUNITY)
Admission: RE | Admit: 2017-08-04 | Discharge: 2017-08-04 | Disposition: A | Discharge status: Home / Self Care | Payer: PPO | Source: Ambulatory Visit | Attending: Cardiovascular Disease | Admitting: Cardiovascular Disease

## 2017-08-04 ENCOUNTER — Ambulatory Visit (HOSPITAL_COMMUNITY)
Admission: RE | Disposition: A | Discharge status: Home / Self Care | Payer: Self-pay | Source: Ambulatory Visit | Attending: Cardiovascular Disease

## 2017-08-04 DIAGNOSIS — F329 Major depressive disorder, single episode, unspecified: Secondary | ICD-10-CM | POA: Diagnosis not present

## 2017-08-04 DIAGNOSIS — M1990 Unspecified osteoarthritis, unspecified site: Secondary | ICD-10-CM | POA: Insufficient documentation

## 2017-08-04 DIAGNOSIS — K219 Gastro-esophageal reflux disease without esophagitis: Secondary | ICD-10-CM | POA: Diagnosis not present

## 2017-08-04 DIAGNOSIS — I25110 Atherosclerotic heart disease of native coronary artery with unstable angina pectoris: Secondary | ICD-10-CM | POA: Insufficient documentation

## 2017-08-04 DIAGNOSIS — I2582 Chronic total occlusion of coronary artery: Secondary | ICD-10-CM | POA: Insufficient documentation

## 2017-08-04 DIAGNOSIS — E785 Hyperlipidemia, unspecified: Secondary | ICD-10-CM | POA: Insufficient documentation

## 2017-08-04 DIAGNOSIS — R072 Precordial pain: Secondary | ICD-10-CM

## 2017-08-04 DIAGNOSIS — I10 Essential (primary) hypertension: Secondary | ICD-10-CM | POA: Insufficient documentation

## 2017-08-04 DIAGNOSIS — Z7982 Long term (current) use of aspirin: Secondary | ICD-10-CM | POA: Diagnosis not present

## 2017-08-04 DIAGNOSIS — Z7951 Long term (current) use of inhaled steroids: Secondary | ICD-10-CM | POA: Insufficient documentation

## 2017-08-04 DIAGNOSIS — F419 Anxiety disorder, unspecified: Secondary | ICD-10-CM | POA: Diagnosis not present

## 2017-08-04 DIAGNOSIS — F17210 Nicotine dependence, cigarettes, uncomplicated: Secondary | ICD-10-CM | POA: Diagnosis not present

## 2017-08-04 DIAGNOSIS — M810 Age-related osteoporosis without current pathological fracture: Secondary | ICD-10-CM | POA: Insufficient documentation

## 2017-08-04 HISTORY — PX: LEFT HEART CATH AND CORONARY ANGIOGRAPHY: CATH118249

## 2017-08-04 LAB — BASIC METABOLIC PANEL
BUN/Creatinine Ratio: 14 (ref 10–24)
BUN: 16 mg/dL (ref 8–27)
CO2: 23 mmol/L (ref 20–29)
CREATININE: 1.18 mg/dL (ref 0.76–1.27)
Calcium: 9 mg/dL (ref 8.6–10.2)
Chloride: 104 mmol/L (ref 96–106)
GFR, EST AFRICAN AMERICAN: 74 mL/min/{1.73_m2} (ref >59)
GFR, EST NON AFRICAN AMERICAN: 64 mL/min/{1.73_m2} (ref >59)
Glucose: 83 mg/dL (ref 65–99)
Potassium: 4.1 mmol/L (ref 3.5–5.2)
SODIUM: 143 mmol/L (ref 134–144)

## 2017-08-04 LAB — CBC
HEMATOCRIT: 47.2 % (ref 37.5–51.0)
HEMOGLOBIN: 15.8 g/dL (ref 13.0–17.7)
MCH: 30.5 pg (ref 26.6–33.0)
MCHC: 33.5 g/dL (ref 31.5–35.7)
MCV: 91 fL (ref 79–97)
Platelets: 206 10*3/uL (ref 150–379)
RBC: 5.18 x10E6/uL (ref 4.14–5.80)
RDW: 14.1 % (ref 12.3–15.4)
WBC: 10.3 10*3/uL (ref 3.4–10.8)

## 2017-08-04 LAB — APTT: aPTT: 28 s (ref 24–33)

## 2017-08-04 LAB — PROTIME-INR
INR: 1 (ref 0.8–1.2)
PROTHROMBIN TIME: 10.2 s (ref 9.1–12.0)

## 2017-08-04 LAB — GLUCOSE, CAPILLARY: GLUCOSE-CAPILLARY: 76 mg/dL (ref 65–99)

## 2017-08-04 LAB — TSH: TSH: 7.36 u[IU]/mL — ABNORMAL HIGH (ref 0.450–4.500)

## 2017-08-04 SURGERY — LEFT HEART CATH AND CORONARY ANGIOGRAPHY
Anesthesia: LOCAL

## 2017-08-04 MED ORDER — SODIUM CHLORIDE 0.9% FLUSH: 3.0000 mL | INTRAVENOUS | Status: DC | PRN

## 2017-08-04 MED ORDER — MIDAZOLAM HCL 2 MG/2ML IJ SOLN
INTRAMUSCULAR | Status: DC | PRN
  Administered 2017-08-04: 2 mg via INTRAVENOUS

## 2017-08-04 MED ORDER — MIDAZOLAM HCL 2 MG/2ML IJ SOLN
INTRAMUSCULAR | Status: AC
  Filled 2017-08-04: qty 2

## 2017-08-04 MED ORDER — HEPARIN SODIUM (PORCINE) 1000 UNIT/ML IJ SOLN
INTRAMUSCULAR | Status: AC
  Filled 2017-08-04: qty 1

## 2017-08-04 MED ORDER — FENTANYL CITRATE (PF) 100 MCG/2ML IJ SOLN
INTRAMUSCULAR | Status: DC | PRN
  Administered 2017-08-04: 25 ug via INTRAVENOUS

## 2017-08-04 MED ORDER — HEPARIN (PORCINE) IN NACL 2-0.9 UNIT/ML-% IJ SOLN
INTRAMUSCULAR | Status: AC
  Filled 2017-08-04: qty 1000

## 2017-08-04 MED ORDER — VERAPAMIL HCL 2.5 MG/ML IV SOLN
INTRAVENOUS | Status: AC
  Filled 2017-08-04: qty 2

## 2017-08-04 MED ORDER — HEPARIN (PORCINE) IN NACL 2-0.9 UNIT/ML-% IJ SOLN
INTRAMUSCULAR | Status: AC | PRN
  Administered 2017-08-04: 1000 mL via INTRA_ARTERIAL

## 2017-08-04 MED ORDER — SODIUM CHLORIDE 0.9 % WEIGHT BASED INFUSION: 1.0000 mL/kg/h | INTRAVENOUS | Status: DC

## 2017-08-04 MED ORDER — SODIUM CHLORIDE 0.9 % IV SOLN: 250.0000 mL | INTRAVENOUS | Status: DC | PRN

## 2017-08-04 MED ORDER — IOPAMIDOL (ISOVUE-370) INJECTION 76%
INTRAVENOUS | Status: AC
  Filled 2017-08-04: qty 100

## 2017-08-04 MED ORDER — HEPARIN SODIUM (PORCINE) 1000 UNIT/ML IJ SOLN
INTRAMUSCULAR | Status: DC | PRN
  Administered 2017-08-04: 3500 [IU] via INTRAVENOUS

## 2017-08-04 MED ORDER — SODIUM CHLORIDE 0.9 % WEIGHT BASED INFUSION
3.0000 mL/kg/h | INTRAVENOUS | Status: AC
  Administered 2017-08-04: 3 mL/kg/h via INTRAVENOUS

## 2017-08-04 MED ORDER — SODIUM CHLORIDE 0.9% FLUSH: 3.0000 mL | Freq: Two times a day (BID) | INTRAVENOUS | Status: DC

## 2017-08-04 MED ORDER — FENTANYL CITRATE (PF) 100 MCG/2ML IJ SOLN
INTRAMUSCULAR | Status: AC
  Filled 2017-08-04: qty 2

## 2017-08-04 MED ORDER — IOPAMIDOL (ISOVUE-370) INJECTION 76%
INTRAVENOUS | Status: DC | PRN
  Administered 2017-08-04: 95 mL via INTRA_ARTERIAL

## 2017-08-04 MED ORDER — ASPIRIN 81 MG PO CHEW
CHEWABLE_TABLET | ORAL | Status: AC
  Administered 2017-08-04: 81 mg via ORAL
  Filled 2017-08-04: qty 1

## 2017-08-04 MED ORDER — VERAPAMIL HCL 2.5 MG/ML IV SOLN
INTRAVENOUS | Status: DC | PRN
  Administered 2017-08-04: 15:00:00 via INTRA_ARTERIAL

## 2017-08-04 MED ORDER — ASPIRIN 81 MG PO CHEW
81.0000 mg | CHEWABLE_TABLET | ORAL | Status: AC
  Administered 2017-08-04: 81 mg via ORAL

## 2017-08-04 MED ORDER — LIDOCAINE HCL (PF) 1 % IJ SOLN
INTRAMUSCULAR | Status: DC | PRN
  Administered 2017-08-04: 2 mL

## 2017-08-04 MED ORDER — SODIUM CHLORIDE 0.9 % IV SOLN: INTRAVENOUS | Status: AC

## 2017-08-04 SURGICAL SUPPLY — 11 items
CATH INFINITI 5 FR JL3.5 (CATHETERS) ×1 IMPLANT
CATH INFINITI 5FR ANG PIGTAIL (CATHETERS) ×1 IMPLANT
CATH INFINITI JR4 5F (CATHETERS) ×1 IMPLANT
GLIDESHEATH SLEND SS 6F .021 (SHEATH) ×1 IMPLANT
GUIDEWIRE INQWIRE 1.5J.035X260 (WIRE) IMPLANT
INQWIRE 1.5J .035X260CM (WIRE) ×2
KIT HEART LEFT (KITS) ×2 IMPLANT
PACK CARDIAC CATHETERIZATION (CUSTOM PROCEDURE TRAY) ×2 IMPLANT
SYR MEDRAD MARK V 150ML (SYRINGE) ×2 IMPLANT
TRANSDUCER W/STOPCOCK (MISCELLANEOUS) ×2 IMPLANT
TUBING CIL FLEX 10 FLL-RA (TUBING) ×2 IMPLANT

## 2017-08-04 NOTE — Discharge Instructions (Signed)

## 2017-08-04 NOTE — Interval H&P Note (Signed)
History and Physical Interval Note:  08/04/2017 2:24 PM  [name] [name]  has presented today for cardiac cath with the diagnosis of unstable angina  The various methods of treatment have been discussed with the patient and family. After consideration of risks, benefits and other options for treatment, the patient has consented to  Procedure(s): LEFT HEART CATH AND CORONARY ANGIOGRAPHY (N/A) as a surgical intervention .  The patient's history has been reviewed, patient examined, no change in status, stable for surgery.  I have reviewed the patient's chart and labs.  Questions were answered to the patient's satisfaction.    Cath Lab Visit (complete for each Cath Lab visit)  Clinical Evaluation Leading to the Procedure:   ACS: No.  Non-ACS:    Anginal Classification: CCS III  Anti-ischemic medical therapy: Minimal Therapy (1 class of medications)  Non-Invasive Test Results: No non-invasive testing performed  Prior CABG: No previous CABG         Lauree Chandler

## 2017-08-05 ENCOUNTER — Encounter (HOSPITAL_COMMUNITY): Payer: Self-pay | Admitting: Cardiovascular Disease

## 2017-08-05 MED FILL — Heparin Sodium (Porcine) 2 Unit/ML in Sodium Chloride 0.9%: INTRAMUSCULAR | Qty: 1000 | Status: AC

## 2017-08-06 ENCOUNTER — Telehealth: Payer: Self-pay | Admitting: *Deleted

## 2017-08-06 ENCOUNTER — Other Ambulatory Visit: Payer: PPO

## 2017-08-06 DIAGNOSIS — R7989 Other specified abnormal findings of blood chemistry: Secondary | ICD-10-CM | POA: Diagnosis not present

## 2017-08-06 NOTE — Telephone Encounter (Signed)
-----   Message from Minus Breeding, MD sent at 08/04/2017  7:38 AM EST ----- TSH elevated.  Please send T3 and T4.  Call Mr. Mandler with the results and send results to Timmothy Euler, MD

## 2017-08-06 NOTE — Telephone Encounter (Signed)
Pt aware of his blood work, t3 and t4 ordered and pt stated he will go to his PCP office Dr Wendi Snipes to get lab drawn  Result send to pt PCP via epic

## 2017-08-07 LAB — T3: T3 TOTAL: 117 ng/dL (ref 71–180)

## 2017-08-07 LAB — T4, FREE: FREE T4: 1.02 ng/dL (ref 0.82–1.77)

## 2017-08-23 NOTE — Progress Notes (Signed)
Cardiology Office Note   Date:  08/24/2017   ID:  [name] [name], DOB 1951-01-21, MRN [id number]  PCP:  Timmothy Euler, MD  Cardiologist:   No primary care provider on file. Referring:  Timmothy Euler, MD  Chief Complaint  Patient presents with  . Coronary Artery Disease      History of Present Illness: [name] [name] is a 67 y.o. male who was referred by Timmothy Euler, MD for evaluation of chest pain.  I saw him in 2013.  He had non obstructive CAD.   I saw him earlier this year for evaluation of unstable angina.  I sent him for a cath with results as below.  He was found to have an occluded RCA and he was treated medically.    Since then he has had none of the chest heaviness that prompted his catheterization.  He has some fleeting sharp pains.  He still driving a truck with significant stress.  He still smokes a few cigarettes.  He did tolerate Livalo which I used at the last visit. The patient denies any new symptoms such as chest discomfort, neck or arm discomfort. There has been no new shortness of breath, PND or orthopnea. There have been no reported palpitations, presyncope or syncope.   Past Medical History:  Diagnosis Date  . Allergy   . Anxiety    Patient denies  . Arthritis   . CAD (coronary artery disease)    The EF was 55% with inferior hypokinesis. Cath 09  . Carotid stenosis   . Coronary artery disease    left main had luminal irregularities, the LAD had 30% stenosis, the first diagonal had moderate-sized with ostial 35% stenosis, the circumflex in the AV groove had ostial 25% stenosis, the first obtuse marginal had long proximal 30% stenosis, the mid obtuse marginal was normal, the right coronary artery was dominant and occluded proximally.  There was excellent left to right collateral filling.     . Depression    Patient denies  . Diverticulitis   . Dyslipidemia   . Essential hypertension 08/24/2017  . GERD (gastroesophageal reflux  disease)   . Lung nodule    benign  . Osteoporosis   . Tobacco abuse     Past Surgical History:  Procedure Laterality Date  . CAROTID ENDARTERECTOMY Right   . COLONOSCOPY    . LEFT HEART CATH AND CORONARY ANGIOGRAPHY N/A 08/04/2017   Procedure: LEFT HEART CATH AND CORONARY ANGIOGRAPHY;  Surgeon: Burnell Blanks, MD;  Location: [facility];  Service: Cardiovascular;  Laterality: N/A;  . left inguinal herniorrhaphy    . THROAT SURGERY       Current Outpatient Medications  Medication Sig Dispense Refill  . albuterol (PROVENTIL HFA;VENTOLIN HFA) 108 (90 Base) MCG/ACT inhaler Inhale 2 puffs into the lungs every 4 (four) hours as needed for wheezing or shortness of breath. 1 Inhaler 2  . aspirin EC 81 MG tablet Take 81 mg by mouth daily.    . calcium carbonate (CALCIUM 600) 600 MG TABS tablet Take 600 mg by mouth daily with breakfast.    . Cholecalciferol (VITAMIN D-3 PO) Take 1 tablet by mouth daily.     . Flaxseed, Linseed, (FLAXSEED OIL) 1000 MG CAPS Take 1,000 mg by mouth daily.     . fluticasone (FLONASE) 50 MCG/ACT nasal spray Place 2 sprays into both nostrils daily. (Patient taking differently: Place 2 sprays into both nostrils daily as needed (for allergies.). )  16 g 0  . metoprolol succinate (TOPROL-XL) 50 MG 24 hr tablet Take 1 tablet (50 mg total) by mouth daily. 90 tablet 3  . Multiple Vitamin (MULTIVITAMIN WITH MINERALS) TABS tablet Take 1 tablet by mouth daily. Centrum Silver    . naproxen sodium (ALEVE) 220 MG tablet Take 440 mg by mouth 2 (two) times daily as needed (for pain.).    Marland Kitchen omeprazole (PRILOSEC) 20 MG capsule Take 20 mg by mouth daily.    . nitroGLYCERIN (NITROSTAT) 0.4 MG SL tablet Place 1 tablet (0.4 mg total) under the tongue every 5 (five) minutes as needed for chest pain. 25 tablet 3  . Pitavastatin Calcium (LIVALO) 2 MG TABS Take 1 tablet (2 mg total) by mouth daily. 30 tablet 11   No current facility-administered medications for this visit.       Allergies:   Crestor [rosuvastatin calcium]; Flomax [tamsulosin hcl]; and Lipitor [atorvastatin calcium]    ROS:  Please see the history of present illness.   Otherwise, review of systems are positive for none.   All other systems are reviewed and negative.    PHYSICAL EXAM: VS:  BP (!) 156/78   Pulse 78   Ht 5\' 10"  (1.778 m)   Wt 147 lb 12.8 oz (67 kg)   SpO2 97%   BMI 21.21 kg/m  , BMI Body mass index is 21.21 kg/m.  GENERAL:  Well appearing NECK:  No jugular venous distention, waveform within normal limits, carotid upstroke brisk and symmetric, no bruits, no thyromegaly LUNGS:  Clear to auscultation bilaterally CHEST:  Unremarkable HEART:  PMI not displaced or sustained,S1 and S2 within normal limits, no S3, no S4, no clicks, no rubs, no murmurs ABD:  Flat, positive bowel sounds normal in frequency in pitch, no bruits, no rebound, no guarding, no midline pulsatile mass, no hepatomegaly, no splenomegaly EXT:  2 plus pulses throughout, no edema, no cyanosis no clubbing   EKG:  EKG is not ordered today.   CARDIAC CATH:  Conclusion     Prox RCA to Mid RCA lesion is 100% stenosed.  Ost Cx to Prox Cx lesion is 60% stenosed.  Ost 1st Mrg lesion is 40% stenosed.  Mid LAD to Dist LAD lesion is 50% stenosed.  Dist LM to Prox LAD lesion is 20% stenosed.  The left ventricular systolic function is normal.  LV end diastolic pressure is normal.  The left ventricular ejection fraction is 55-65% by visual estimate.  There is no mitral valve regurgitation.   1. Chronic total occlusion of the large, dominant mid RCA with filling of the distal RCA from right to right bridging collaterals and left to right collaterals.  2. The LAD is a large caliber vessel that courses to the apex. The mid to distal LAD has a moderate stenosis. This does not appear to be flow limiting. The first diagonal branch is moderate in caliber and has a moderate stenosis.  3. The Circumflex artery is  a moderate caliber vessel that supplies two OM branches. There is a moderate stenosis in the proximal Circumflex extending back to the ostium. This is looked at closely in multiple views and does not appear to be flow limiting. This involves a moderate caliber first OM branch which has moderate ostial stenosis.  4. Normal LV systolic function      Recent Labs: 07/13/2017: ALT 13 08/03/2017: BUN 16; Creatinine, Ser 1.18; Hemoglobin 15.8; Platelets 206; Potassium 4.1; Sodium 143; TSH 7.360    Lipid Panel  Component Value Date/Time   CHOL 206 (H) 07/13/2017 0930   TRIG 102 07/13/2017 0930   HDL 50 07/13/2017 0930   CHOLHDL 4.1 07/13/2017 0930   CHOLHDL 5.5 03/08/2013 0509   VLDL 22 03/08/2013 0509   LDLCALC 136 (H) 07/13/2017 0930      Wt Readings from Last 3 Encounters:  08/24/17 147 lb 12.8 oz (67 kg)  08/04/17 149 lb (67.6 kg)  08/03/17 148 lb (67.1 kg)      Other studies Reviewed: Additional studies/ records that were reviewed today include:   Cardiac cath report.   Review of the above records demonstrates:  Please see elsewhere in the note.     ASSESSMENT AND PLAN:  CAD:   The patient has no new sypmtoms.  No further cardiovascular testing is indicated.  We will continue with aggressive risk reduction and meds as listed.  Unfortunately he has not yet started practicing significant risk reduction.  I am going to give him sublingual nitroglycerin pills.  I am going to increase his metoprolol to 50 mg daily.  HTN:   Blood pressure is slightly elevated and will be controlled as above.  DYSLIPIDEMIA:   He tolerated Livalo samples and will now be started on this.  We will get a lipid profile in about 8 weeks.  I could not check his lipids today because he ate an egg sandwich with mayonnaise.   TOBACCO:   Chantix did not work.  He is told to call [phone]  Current medicines are reviewed at length with the patient today.  The patient does not have concerns regarding  medicines.  The following changes have been made:  As above  Labs/ tests ordered today include: None  Orders Placed This Encounter  Procedures  . Lipid panel     Disposition:   FU with me in six months.    Signed, Minus Breeding, MD  08/24/2017 11:03 AM    [facility]

## 2017-08-24 ENCOUNTER — Encounter: Payer: Self-pay | Admitting: Cardiology

## 2017-08-24 ENCOUNTER — Ambulatory Visit: Payer: PPO | Admitting: Cardiology

## 2017-08-24 VITALS — BP 156/78 | HR 78 | Ht 70.0 in | Wt 147.8 lb

## 2017-08-24 DIAGNOSIS — Z720 Tobacco use: Secondary | ICD-10-CM

## 2017-08-24 DIAGNOSIS — I10 Essential (primary) hypertension: Secondary | ICD-10-CM | POA: Diagnosis not present

## 2017-08-24 DIAGNOSIS — E785 Hyperlipidemia, unspecified: Secondary | ICD-10-CM

## 2017-08-24 DIAGNOSIS — I25119 Atherosclerotic heart disease of native coronary artery with unspecified angina pectoris: Secondary | ICD-10-CM

## 2017-08-24 DIAGNOSIS — I2510 Atherosclerotic heart disease of native coronary artery without angina pectoris: Secondary | ICD-10-CM | POA: Insufficient documentation

## 2017-08-24 HISTORY — DX: Essential (primary) hypertension: I10

## 2017-08-24 MED ORDER — NITROGLYCERIN 0.4 MG SL SUBL: 0.4000 mg | SUBLINGUAL_TABLET | SUBLINGUAL | 3 refills | Status: DC | PRN

## 2017-08-24 MED ORDER — METOPROLOL SUCCINATE ER 50 MG PO TB24: 50.0000 mg | ORAL_TABLET | Freq: Every day | ORAL | 3 refills | Status: DC

## 2017-08-24 MED ORDER — PITAVASTATIN CALCIUM 2 MG PO TABS: 2.0000 mg | ORAL_TABLET | Freq: Every day | ORAL | 11 refills | Status: DC

## 2017-08-24 NOTE — Patient Instructions (Addendum)
Medication Instructions:  Continue current medications INCREASE- Metoprolol 50 mg daily START- Livalo 2 mg daily  If you need a refill on your cardiac medications before your next appointment, please call your pharmacy.  Labwork: Fasting Lipids in 8 weeks HERE IN OUR OFFICE AT [facility]  Take the provided lab slips for you to take with you to the lab for you blood draw.   You will need to fast. DO NOT EAT OR DRINK PAST MIDNIGHT.   Testing/Procedures: None Ordered   Follow-Up: Your physician wants you to follow-up in: 6 Months. You should receive a reminder letter in the mail two months in advance. If you do not receive a letter, please call our office [phone].    Thank you for choosing [facility] at [facility]!!

## 2017-09-12 DIAGNOSIS — H04123 Dry eye syndrome of bilateral lacrimal glands: Secondary | ICD-10-CM | POA: Diagnosis not present

## 2017-09-12 DIAGNOSIS — H40033 Anatomical narrow angle, bilateral: Secondary | ICD-10-CM | POA: Diagnosis not present

## 2017-09-26 DIAGNOSIS — G44219 Episodic tension-type headache, not intractable: Secondary | ICD-10-CM | POA: Diagnosis not present

## 2017-10-26 ENCOUNTER — Other Ambulatory Visit: Payer: Self-pay

## 2017-10-26 ENCOUNTER — Encounter: Payer: Self-pay | Admitting: Family Medicine

## 2017-10-26 ENCOUNTER — Ambulatory Visit (INDEPENDENT_AMBULATORY_CARE_PROVIDER_SITE_OTHER): Payer: PPO | Admitting: Family Medicine

## 2017-10-26 VITALS — BP 164/83 | HR 95 | Temp 97.3°F | Ht 70.0 in | Wt 149.8 lb

## 2017-10-26 DIAGNOSIS — N509 Disorder of male genital organs, unspecified: Secondary | ICD-10-CM

## 2017-10-26 MED ORDER — AMOXICILLIN-POT CLAVULANATE 875-125 MG PO TABS: 1.0000 | ORAL_TABLET | Freq: Two times a day (BID) | ORAL | 0 refills | Status: DC

## 2017-10-26 MED ORDER — DOXYCYCLINE HYCLATE 100 MG PO TABS: 100.0000 mg | ORAL_TABLET | Freq: Two times a day (BID) | ORAL | 0 refills | Status: DC

## 2017-10-26 NOTE — Patient Instructions (Signed)
Great to see you!  If you have spreading of the lesion, redness in the groin, or fevers/chills/sweats that are concerning you please seek medical attention again.

## 2017-10-26 NOTE — Progress Notes (Signed)
   HPI  Patient presents today sore on his scrotum.  Patient explains this started about 2 months and was the size of the tip of his pinky, over the last 4 to 5 days has become larger and more tender. He denies any fever, chills, sweats.  He denies any history of testicular cancer.  His brother had prostate cancer and then had an infection in the groin causing loss of his testicles, we discussed how dangerous Fournier's gangrene can be.  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  He is good to drive to [place] over the next 2 days and be back 4 days from now.  PMH: Smoking status noted ROS: Per HPI  Objective: BP (!) 164/83   Pulse 95   Temp (!) 97.3 F (36.3 C) (Oral)   Ht 5\' 10"  (1.778 m)   Wt 149 lb 12.8 oz (67.9 kg)   BMI 21.49 kg/m  Gen: NAD, alert, cooperative with exam HEENT: NCAT CV: RRR, good S1/S2, no murmur Resp: CTABL, no wheezes, non-labored Abd: SNTND, BS present, no guarding or organomegaly Ext: No edema, warm Neuro: Alert and oriented, No gross deficits GU:  Scrotal lesion  on upper R side that is firm and feels slightly fixed to the scrotal wall, tender to palpation, no drainage, entire area measures approximately 2 cm x 1 cm with a papule at some approximately 5 mm in diameter  Assessment and plan:  #Scrotal skin lesion Concerning for infection versus malignancy Starting aggressive antibiotic coverage with doxycycline plus Augmentin as he is going on the road today. Scrotal ultrasound when he returns on Friday Low threshold for urology referral if symptoms do not improve quickly  ( we discussed [facility] if needed)  Orders Placed This Encounter  Procedures  . US Scrotum    Standing Status:   Future    Standing Expiration Date:   12/27/2018    Scheduling Instructions:     If possible schedule for Friday 5/10    Order Specific Question:   Reason for Exam (SYMPTOM  OR DIAGNOSIS REQUIRED)    Answer:   R  scrotal mass abscess vs malignancy    Order Specific Question:   Preferred imaging location?    Answer:   [facility]    Meds ordered this encounter  Medications  . doxycycline (VIBRA-TABS) 100 MG tablet    Sig: Take 1 tablet (100 mg total) by mouth 2 (two) times daily. 1 po bid    Dispense:  20 tablet    Refill:  0  . amoxicillin-clavulanate (AUGMENTIN) 875-125 MG tablet    Sig: Take 1 tablet by mouth 2 (two) times daily.    Dispense:  20 tablet    Refill:  0    Laroy Apple, MD [facility] 10/26/2017, 8:20 AM

## 2017-10-30 ENCOUNTER — Telehealth: Payer: Self-pay | Admitting: Family Medicine

## 2017-10-30 ENCOUNTER — Ambulatory Visit (HOSPITAL_COMMUNITY)
Admission: RE | Admit: 2017-10-30 | Discharge: 2017-10-30 | Disposition: A | Discharge status: Home / Self Care | Payer: PPO | Source: Ambulatory Visit | Attending: Family Medicine | Admitting: Family Medicine

## 2017-10-30 DIAGNOSIS — N5089 Other specified disorders of the male genital organs: Secondary | ICD-10-CM | POA: Diagnosis not present

## 2017-10-30 DIAGNOSIS — N509 Disorder of male genital organs, unspecified: Secondary | ICD-10-CM

## 2017-10-30 NOTE — Telephone Encounter (Signed)
Discussed scrotal US with pt- needs urology eval.   Laroy Apple, MD [facility] 10/30/2017, 4:37 PM

## 2017-11-09 DIAGNOSIS — D294 Benign neoplasm of scrotum: Secondary | ICD-10-CM | POA: Diagnosis not present

## 2017-11-27 DIAGNOSIS — D294 Benign neoplasm of scrotum: Secondary | ICD-10-CM | POA: Diagnosis not present

## 2018-05-08 NOTE — Progress Notes (Signed)
Cardiology Office Note   Date:  05/10/2018   ID:  [name] [name], DOB [birth year], MRN [id number]  PCP:  Timmothy Euler, MD  Cardiologist:   No primary care provider on file. Referring:  Timmothy Euler, MD  Chief Complaint  Patient presents with  . Coronary Artery Disease      History of Present Illness: [name] [name] is a 67 y.o. male who was referred by Timmothy Euler, MD for evaluation of chest pain.  I saw him in 2013.  He had non obstructive CAD.   I saw him earlier this year for evaluation of unstable angina.  I sent him for a cath with results as below.  He was found to have an occluded RCA and he was treated medically.    He returns for follow up.  He has been doing well. The patient denies any new symptoms such as chest discomfort, neck or arm discomfort. There has been no new shortness of breath, PND or orthopnea. There have been no reported palpitations, presyncope or syncope.  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  He has not required NTGSL.     Past Medical History:  Diagnosis Date  . Allergy   . Anxiety    Patient denies  . Arthritis   . CAD (coronary artery disease)    The EF was 55% with inferior hypokinesis. Cath 09  . Carotid stenosis   . Coronary artery disease    left main had luminal irregularities, the LAD had 30% stenosis, the first diagonal had moderate-sized with ostial 35% stenosis, the circumflex in the AV groove had ostial 25% stenosis, the first obtuse marginal had long proximal 30% stenosis, the mid obtuse marginal was normal, the right coronary artery was dominant and occluded proximally.  There was excellent left to right collateral filling.     . Depression    Patient denies  . Diverticulitis   . Dyslipidemia   . Essential hypertension 08/24/2017  . GERD (gastroesophageal reflux disease)   . Lung nodule    benign  . Osteoporosis   . Tobacco abuse     Past Surgical History:  Procedure  Laterality Date  . CAROTID ENDARTERECTOMY Right   . COLONOSCOPY    . LEFT HEART CATH AND CORONARY ANGIOGRAPHY N/A 08/04/2017   Procedure: LEFT HEART CATH AND CORONARY ANGIOGRAPHY;  Surgeon: Burnell Blanks, MD;  Location: [place] [facility];  Service: Cardiovascular;  Laterality: N/A;  . left inguinal herniorrhaphy    . THROAT SURGERY       Current Outpatient Medications  Medication Sig Dispense Refill  . albuterol (PROVENTIL HFA;VENTOLIN HFA) 108 (90 Base) MCG/ACT inhaler Inhale 2 puffs into the lungs every 4 (four) hours as needed for wheezing or shortness of breath. 1 Inhaler 2  . aspirin EC 81 MG tablet Take 81 mg by mouth daily.    . Cholecalciferol (VITAMIN D-3 PO) Take 1 tablet by mouth daily.     Marland Kitchen doxycycline (VIBRA-TABS) 100 MG tablet Take 1 tablet (100 mg total) by mouth 2 (two) times daily. 1 po bid 20 tablet 0  . Flaxseed, Linseed, (FLAXSEED OIL) 1000 MG CAPS Take 1,000 mg by mouth daily.     . fluticasone (FLONASE) 50 MCG/ACT nasal spray Place 2 sprays into both nostrils daily. (Patient taking differently: Place 2 sprays into both nostrils daily as needed (for allergies.). ) 16 g 0  . Multiple Vitamin (MULTIVITAMIN WITH MINERALS)  TABS tablet Take 1 tablet by mouth daily. Centrum Silver    . naproxen sodium (ALEVE) 220 MG tablet Take 440 mg by mouth 2 (two) times daily as needed (for pain.).    Marland Kitchen omeprazole (PRILOSEC) 20 MG capsule Take 20 mg by mouth daily.    . Pitavastatin Calcium (LIVALO) 2 MG TABS Take 1 tablet (2 mg total) by mouth daily. 30 tablet 11  . nitroGLYCERIN (NITROSTAT) 0.4 MG SL tablet Place 1 tablet (0.4 mg total) under the tongue every 5 (five) minutes as needed for chest pain. 25 tablet 3   No current facility-administered medications for this visit.     Allergies:   Crestor [rosuvastatin calcium]; Flomax [tamsulosin hcl]; and Lipitor [atorvastatin calcium]    ROS:  Please see the history of present illness.   Otherwise, review of systems are  positive for none.   All other systems are reviewed and negative.    PHYSICAL EXAM: VS:  BP (!) 142/80   Pulse 86   Ht 5\' 11"  (1.803 m)   Wt 150 lb 6.4 oz (68.2 kg)   BMI 20.98 kg/m  , BMI Body mass index is 20.98 kg/m.  GENERAL:  Well appearing NECK:  No jugular venous distention, waveform within normal limits, carotid upstroke brisk and symmetric, no bruits, no thyromegaly, right CEA scar LUNGS:  Clear to auscultation bilaterally CHEST:  Unremarkable HEART:  PMI not displaced or sustained,S1 and S2 within normal limits, no S3, no S4, no clicks, no rubs, no murmurs ABD:  Flat, positive bowel sounds normal in frequency in pitch, no bruits, no rebound, no guarding, no midline pulsatile mass, no hepatomegaly, no splenomegaly EXT:  2 plus pulses throughout, no edema, no cyanosis no clubbing   EKG:  EKG is  ordered today. Sinus rhythm, rate 86, axis within normal limits, intervals within normal limits, no acute ST-T wave changes.   CARDIAC CATH:  Conclusion     Prox RCA to Mid RCA lesion is 100% stenosed.  Ost Cx to Prox Cx lesion is 60% stenosed.  Ost 1st Mrg lesion is 40% stenosed.  Mid LAD to Dist LAD lesion is 50% stenosed.  Dist LM to Prox LAD lesion is 20% stenosed.  The left ventricular systolic function is normal.  LV end diastolic pressure is normal.  The left ventricular ejection fraction is 55-65% by visual estimate.  There is no mitral valve regurgitation.   1. Chronic total occlusion of the large, dominant mid RCA with filling of the distal RCA from right to right bridging collaterals and left to right collaterals.  2. The LAD is a large caliber vessel that courses to the apex. The mid to distal LAD has a moderate stenosis. This does not appear to be flow limiting. The first diagonal branch is moderate in caliber and has a moderate stenosis.  3. The Circumflex artery is a moderate caliber vessel that supplies two OM branches. There is a moderate stenosis in  the proximal Circumflex extending back to the ostium. This is looked at closely in multiple views and does not appear to be flow limiting. This involves a moderate caliber first OM branch which has moderate ostial stenosis.  4. Normal LV systolic function      Recent Labs: 07/13/2017: ALT 13 08/03/2017: BUN 16; Creatinine, Ser 1.18; Hemoglobin 15.8; Platelets 206; Potassium 4.1; Sodium 143; TSH 7.360    Lipid Panel    Component Value Date/Time   CHOL 206 (H) 07/13/2017 0930   TRIG 102 07/13/2017 0930  HDL 50 07/13/2017 0930   CHOLHDL 4.1 07/13/2017 0930   CHOLHDL 5.5 03/08/2013 0509   VLDL 22 03/08/2013 0509   LDLCALC 136 (H) 07/13/2017 0930      Wt Readings from Last 3 Encounters:  05/10/18 150 lb 6.4 oz (68.2 kg)  10/26/17 149 lb 12.8 oz (67.9 kg)  08/24/17 147 lb 12.8 oz (67 kg)      Other studies Reviewed: Additional studies/ records that were reviewed today include:   Labs Review of the above records demonstrates:  See above and below   ASSESSMENT AND PLAN:  CAD:   The patient has no new sypmtoms.  No further cardiovascular testing is indicated.  We will continue with aggressive risk reduction and meds as listed.. I try again emphasized secondary risk reduction.  HTN:    Blood pressure is very slightly elevated today but he tells me that at home is been well controlled not make no changes to his medicines.  DYSLIPIDEMIA:      He is going to come back and get a lipid profile with liver enzymes.  TOBACCO:   Chantix did not work.  He was told to call [facility].  He still has not called this number and I encouraged that.  CAROTID STENOSIS: I will schedule follow-up carotid Dopplers.  Current medicines are reviewed at length with the patient today.  The patient does not have concerns regarding medicines.  The following changes have been made: None  Labs/ tests ordered today include:   Orders Placed This Encounter  Procedures  . Lipid panel  . Hepatic  function panel  . EKG 12-Lead     Disposition:   FU with me in 12 months.    Signed, Minus Breeding, MD  05/10/2018 10:27 AM    [facility]

## 2018-05-10 ENCOUNTER — Ambulatory Visit: Payer: PPO | Admitting: Cardiology

## 2018-05-10 ENCOUNTER — Encounter: Payer: Self-pay | Admitting: Cardiology

## 2018-05-10 VITALS — BP 142/80 | HR 86 | Ht 71.0 in | Wt 150.4 lb

## 2018-05-10 DIAGNOSIS — R0989 Other specified symptoms and signs involving the circulatory and respiratory systems: Secondary | ICD-10-CM

## 2018-05-10 DIAGNOSIS — E785 Hyperlipidemia, unspecified: Secondary | ICD-10-CM | POA: Diagnosis not present

## 2018-05-10 DIAGNOSIS — Z79899 Other long term (current) drug therapy: Secondary | ICD-10-CM

## 2018-05-10 DIAGNOSIS — I25119 Atherosclerotic heart disease of native coronary artery with unspecified angina pectoris: Secondary | ICD-10-CM

## 2018-05-10 NOTE — Patient Instructions (Signed)
Medication Instructions:  Continue current medications  If you need a refill on your cardiac medications before your next appointment, please call your pharmacy.  Labwork: Fasting Lipids and Liver HERE IN OUR OFFICE AT [facility]  Take the provided lab slips with you to the lab for your blood draw.   You will need to fast. DO NOT EAT OR DRINK PAST MIDNIGHT.   If you have labs (blood work) drawn today and your tests are completely normal, you will receive your results only by: Marland Kitchen MyChart Message (if you have MyChart) OR . A paper copy in the mail If you have any lab test that is abnormal or we need to change your treatment, we will call you to review the results.  Testing/Procedures: Your physician has requested that you have a carotid duplex. This test is an ultrasound of the carotid arteries in your neck. It looks at blood flow through these arteries that supply the brain with blood. Allow one hour for this exam. There are no restrictions or special instructions.  Follow-Up: You will need a follow up appointment in 1 Year.  Please call our office 2 months in advance([phone]) to schedule the (1 Year) appointment.  You may see  DR Percival Spanish,  or one of the following Advanced Practice Providers on your designated Care Team:    . Jory Sims, DNP, ANP . Rhonda Barrett, PA-C  . Kerin Ransom, [place]  . Almyra Deforest, PA-C . Fabian Sharp, PA-C  At [facility], you and your health needs are our priority.  As part of our continuing mission to provide you with exceptional heart care, we have created designated Provider Care Teams.  These Care Teams include your primary Cardiologist (physician) and Advanced Practice Providers (APPs -  Physician Assistants and Nurse Practitioners) who all work together to provide you with the care you need, when you need it.   Thank you for choosing [facility] at [facility]!!

## 2018-06-07 ENCOUNTER — Encounter: Payer: Self-pay | Admitting: Family Medicine

## 2018-06-07 ENCOUNTER — Ambulatory Visit (INDEPENDENT_AMBULATORY_CARE_PROVIDER_SITE_OTHER): Payer: PPO | Admitting: Family Medicine

## 2018-06-07 VITALS — BP 173/91 | HR 90 | Temp 97.0°F | Ht 71.0 in | Wt 149.6 lb

## 2018-06-07 DIAGNOSIS — J0110 Acute frontal sinusitis, unspecified: Secondary | ICD-10-CM | POA: Diagnosis not present

## 2018-06-07 DIAGNOSIS — R6889 Other general symptoms and signs: Secondary | ICD-10-CM | POA: Diagnosis not present

## 2018-06-07 DIAGNOSIS — R52 Pain, unspecified: Secondary | ICD-10-CM | POA: Diagnosis not present

## 2018-06-07 LAB — VERITOR FLU A/B WAIVED
INFLUENZA B: NEGATIVE
Influenza A: NEGATIVE

## 2018-06-07 NOTE — Progress Notes (Signed)
BP (!) 173/91   Pulse 90   Temp (!) 97 F (36.1 C) (Oral)   Ht 5\' 11"  (1.803 m)   Wt 149 lb 9.6 oz (67.9 kg)   SpO2 97%   BMI 20.86 kg/m    Subjective:    Patient ID: [name] [name], male    DOB: 20-Aug-1950, 67 y.o.   MRN: [id number]  HPI: [name] [name] is a 67 y.o. male presenting on 06/07/2018 for Headache (x 2 days); Generalized Body Aches; Chills; Cough; and Nasal Congestion   HPI Headache and congestion and body aches and chills  Patient comes in complaining of headache and congestion body ache and chills that is been going on for the past 2 days and started yesterday early in the day and has progressed through today.  He feels like he just aches throughout his body including his back all the way down to his legs.  He says he is not had a long of cough but has had a cough and some nasal congestion and sinus pressure and a sinus headache.  He has been having chills and fevers but he has not taken his temperature.  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  He denies any sick contact specifically that he knows of.  Says he just started yesterday he has not used anything for it yet wanted to come in to see if he has the flu.  Relevant past medical, surgical, family and social history reviewed and updated as indicated. Interim medical history since our last visit reviewed. Allergies and medications reviewed and updated.  Review of Systems  Constitutional: Positive for chills and fever.  HENT: Positive for congestion, postnasal drip, rhinorrhea, sinus pressure, sneezing and sore throat. Negative for ear discharge, ear pain and voice change.   Eyes: Negative for pain, discharge, redness and visual disturbance.  Respiratory: Positive for cough. Negative for shortness of breath and wheezing.   Cardiovascular: Negative for chest pain and leg swelling.  Musculoskeletal: Positive for myalgias. Negative for gait  problem.  Skin: Negative for rash.  Neurological: Positive for headaches.  All other systems reviewed and are negative.   Per HPI unless specifically indicated above   Allergies as of 06/07/2018      Reactions   Crestor [rosuvastatin Calcium] Other (See Comments)   Lethargy and muscle aches   Flomax [tamsulosin Hcl] Other (See Comments)   Made patient hypotensive, clammy, sweaty, and feel like he was going to pass out (EMS HAD TO BE CALLED)   Lipitor [atorvastatin Calcium] Other (See Comments)   Body cramps, lethargy and muscle aches      Medication List       Accurate as of June 07, 2018  8:45 AM. Always use your most recent med list.        albuterol 108 (90 Base) MCG/ACT inhaler Commonly known as:  PROVENTIL HFA;VENTOLIN HFA Inhale 2 puffs into the lungs every 4 (four) hours as needed for wheezing or shortness of breath.   aspirin EC 81 MG tablet Take 81 mg by mouth daily.   Flaxseed Oil 1000 MG Caps Take 1,000 mg by mouth daily.   fluticasone 50 MCG/ACT nasal spray Commonly known as:  FLONASE Place 2 sprays into both nostrils daily.   multivitamin with minerals Tabs tablet Take 1 tablet by mouth daily. Centrum Silver   naproxen sodium 220 MG tablet Commonly known as:  ALEVE Take 440 mg by mouth 2 (two) times daily as needed (for pain.).   nitroGLYCERIN 0.4 MG SL tablet Commonly known as:  NITROSTAT Place 1 tablet (0.4 mg total) under the tongue every 5 (five) minutes as needed for chest pain.   omeprazole 20 MG capsule Commonly known as:  PRILOSEC Take 20 mg by mouth daily.   Pitavastatin Calcium 2 MG Tabs Commonly known as:  LIVALO Take 1 tablet (2 mg total) by mouth daily.   VITAMIN D-3 PO Take 1 tablet by mouth daily.          Objective:    BP (!) 173/91   Pulse 90   Temp (!) 97 F (36.1 C) (Oral)   Ht 5\' 11"  (1.803 m)   Wt 149 lb 9.6 oz (67.9 kg)   SpO2 97%   BMI 20.86 kg/m   Wt Readings from Last 3 Encounters:  06/07/18 149 lb  9.6 oz (67.9 kg)  05/10/18 150 lb 6.4 oz (68.2 kg)  10/26/17 149 lb 12.8 oz (67.9 kg)    Physical Exam Vitals signs and nursing note reviewed.  Constitutional:      General: He is not in acute distress.    Appearance: He is well-developed. He is not diaphoretic.  HENT:     Right Ear: Tympanic membrane, ear canal and external ear normal.     Left Ear: Tympanic membrane, ear canal and external ear normal.     Nose: Mucosal edema and rhinorrhea present.     Right Sinus: No maxillary sinus tenderness or frontal sinus tenderness.     Left Sinus: No maxillary sinus tenderness or frontal sinus tenderness.     Mouth/Throat:     Pharynx: Uvula midline. Posterior oropharyngeal erythema present. No oropharyngeal exudate.     Tonsils: No tonsillar abscesses.  Eyes:     General: No scleral icterus.       Right eye: No discharge.     Conjunctiva/sclera: Conjunctivae normal.  Neck:     Musculoskeletal: Neck supple.     Thyroid: No thyromegaly.  Cardiovascular:     Rate and Rhythm: Normal rate and regular rhythm.     Heart sounds: Normal heart sounds. No murmur.  Pulmonary:     Effort: Pulmonary effort is normal. No respiratory distress.     Breath sounds: Normal breath sounds. No wheezing or rales.  Musculoskeletal: Normal range of motion.  Lymphadenopathy:     Cervical: No cervical adenopathy.  Skin:    General: Skin is warm and dry.     Findings: No rash.  Neurological:     Mental Status: He is alert and oriented to person, place, and time.     Coordination: Coordination normal.  Psychiatric:        Behavior: Behavior normal.    Rapid flu: Negative     Assessment & Plan:   Problem List Items Addressed This Visit    None    Visit Diagnoses    Flu-like symptoms    -  Primary   Relevant Orders   Veritor Flu A/B Waived   Acute non-recurrent frontal sinusitis          Recommended Mucinex and Flonase and nasal saline and conservative management, if worsens over the next couple  days then will consider sending in azithromycin for him Follow up plan: Return if symptoms worsen or fail to improve.  Counseling provided for all of the vaccine components Orders Placed This Encounter  Procedures  . Veritor Flu A/B  Waived    Caryl Pina, MD [facility] 06/07/2018, 8:45 AM

## 2018-06-17 ENCOUNTER — Other Ambulatory Visit: Payer: PPO

## 2018-06-17 DIAGNOSIS — Z79899 Other long term (current) drug therapy: Secondary | ICD-10-CM | POA: Diagnosis not present

## 2018-06-17 DIAGNOSIS — E785 Hyperlipidemia, unspecified: Secondary | ICD-10-CM | POA: Diagnosis not present

## 2018-06-18 ENCOUNTER — Encounter (INDEPENDENT_AMBULATORY_CARE_PROVIDER_SITE_OTHER): Payer: Self-pay

## 2018-06-18 ENCOUNTER — Ambulatory Visit (HOSPITAL_COMMUNITY)
Admission: RE | Admit: 2018-06-18 | Discharge: 2018-06-18 | Disposition: A | Discharge status: Home / Self Care | Payer: PPO | Source: Ambulatory Visit | Attending: Cardiovascular Disease | Admitting: Cardiovascular Disease

## 2018-06-18 DIAGNOSIS — R0989 Other specified symptoms and signs involving the circulatory and respiratory systems: Secondary | ICD-10-CM | POA: Diagnosis not present

## 2018-06-18 LAB — LIPID PANEL
CHOL/HDL RATIO: 3.1 ratio (ref 0.0–5.0)
Cholesterol, Total: 167 mg/dL (ref 100–199)
HDL: 54 mg/dL (ref >39)
LDL Calculated: 97 mg/dL (ref 0–99)
Triglycerides: 78 mg/dL (ref 0–149)
VLDL Cholesterol Cal: 16 mg/dL (ref 5–40)

## 2018-06-18 LAB — HEPATIC FUNCTION PANEL
ALBUMIN: 4.3 g/dL (ref 3.6–4.8)
ALK PHOS: 124 IU/L — AB (ref 39–117)
ALT: 11 IU/L (ref 0–44)
AST: 17 IU/L (ref 0–40)
BILIRUBIN TOTAL: 0.6 mg/dL (ref 0.0–1.2)
BILIRUBIN, DIRECT: 0.17 mg/dL (ref 0.00–0.40)
TOTAL PROTEIN: 7 g/dL (ref 6.0–8.5)

## 2018-06-28 ENCOUNTER — Ambulatory Visit (INDEPENDENT_AMBULATORY_CARE_PROVIDER_SITE_OTHER): Payer: PPO | Admitting: Family Medicine

## 2018-06-28 ENCOUNTER — Encounter: Payer: Self-pay | Admitting: Family Medicine

## 2018-06-28 VITALS — BP 181/93 | HR 83 | Temp 96.8°F | Ht 71.0 in | Wt 149.2 lb

## 2018-06-28 DIAGNOSIS — I10 Essential (primary) hypertension: Secondary | ICD-10-CM

## 2018-06-28 DIAGNOSIS — J441 Chronic obstructive pulmonary disease with (acute) exacerbation: Secondary | ICD-10-CM | POA: Diagnosis not present

## 2018-06-28 MED ORDER — AMLODIPINE BESYLATE 5 MG PO TABS: 5.0000 mg | ORAL_TABLET | Freq: Every day | ORAL | 3 refills | Status: DC

## 2018-06-28 MED ORDER — AZITHROMYCIN 250 MG PO TABS: ORAL_TABLET | ORAL | 0 refills | Status: DC

## 2018-06-28 MED ORDER — PREDNISONE 20 MG PO TABS: ORAL_TABLET | ORAL | 0 refills | Status: DC

## 2018-06-28 MED ORDER — ALBUTEROL SULFATE HFA 108 (90 BASE) MCG/ACT IN AERS: 2.0000 | INHALATION_SPRAY | RESPIRATORY_TRACT | 2 refills | Status: DC | PRN

## 2018-06-28 NOTE — Progress Notes (Signed)
BP (!) 181/93   Pulse 83   Temp (!) 96.8 F (36 C) (Oral)   Ht 5\' 11"  (1.803 m)   Wt 149 lb 3.2 oz (67.7 kg)   BMI 20.81 kg/m    Subjective:    Patient ID: [name] [name], male    DOB: [birth year], 68 y.o.   MRN: [id number]  HPI: [name] [name] is a 68 y.o. male presenting on 06/28/2018 for Cough (Patient was seen 12/16 and states he is a little better); Nasal Congestion; Shortness of Breath; and Anorexia   HPI Cough and congestion and shortness of breath and wheezing Patient is coming in with cough and congestion shortness of breath and wheezing that has been going on since 12/16.  He says initially he had just the congestion and upper respiratory and it was not in his lungs but then now it is gotten down into his lungs over the past couple weeks.  He said he has been taking the Mucinex and he was better and upper but then it got down into his chest.  He says he just not wanting to eat as well.  Hypertension Patient is currently on no medications because this is new diagnosis but in the last couple checks has been up and at home he is consistently checked it and it has still been up in the 150s over 90s every time, and their blood pressure today is 181/93. Patient denies any lightheadedness or dizziness. Patient denies headaches, blurred vision, chest pains, shortness of breath, or weakness. Denies any side effects from medication and is content with current medication.   Relevant past medical, surgical, family and social history reviewed and updated as indicated. Interim medical history since our last visit reviewed. Allergies and medications reviewed and updated.  Review of Systems  Constitutional: Negative for chills and fever.  HENT: Positive for congestion, postnasal drip, rhinorrhea, sinus pressure, sneezing and sore throat. Negative for ear discharge, ear pain and voice change.   Eyes: Negative for pain, discharge, redness and visual disturbance.  Respiratory: Positive  for cough, shortness of breath and wheezing.   Cardiovascular: Negative for chest pain and leg swelling.  Musculoskeletal: Negative for gait problem.  Skin: Negative for rash.  All other systems reviewed and are negative.   Per HPI unless specifically indicated above   Allergies as of 06/28/2018      Reactions   Crestor [rosuvastatin Calcium] Other (See Comments)   Lethargy and muscle aches   Flomax [tamsulosin Hcl] Other (See Comments)   Made patient hypotensive, clammy, sweaty, and feel like he was going to pass out (EMS HAD TO BE CALLED)   Lipitor [atorvastatin Calcium] Other (See Comments)   Body cramps, lethargy and muscle aches      Medication List       Accurate as of June 28, 2018  9:18 AM. Always use your most recent med list.        albuterol 108 (90 Base) MCG/ACT inhaler Commonly known as:  PROVENTIL HFA;VENTOLIN HFA Inhale 2 puffs into the lungs every 4 (four) hours as needed for wheezing or shortness of breath.   amLODipine 5 MG tablet Commonly known as:  NORVASC Take 1 tablet (5 mg total) by mouth daily.   aspirin EC 81 MG tablet Take 81 mg by mouth daily.   azithromycin 250 MG tablet Commonly known as:  ZITHROMAX Take 2 the first day and then one each day after.   Flaxseed Oil 1000 MG Caps Take 1,000 mg  by mouth daily.   fluticasone 50 MCG/ACT nasal spray Commonly known as:  FLONASE Place 2 sprays into both nostrils daily.   multivitamin with minerals Tabs tablet Take 1 tablet by mouth daily. Centrum Silver   naproxen sodium 220 MG tablet Commonly known as:  ALEVE Take 440 mg by mouth 2 (two) times daily as needed (for pain.).   nitroGLYCERIN 0.4 MG SL tablet Commonly known as:  NITROSTAT Place 1 tablet (0.4 mg total) under the tongue every 5 (five) minutes as needed for chest pain.   omeprazole 20 MG capsule Commonly known as:  PRILOSEC Take 20 mg by mouth daily.   predniSONE 20 MG tablet Commonly known as:  DELTASONE 2 po at same time  daily for 5 days   VITAMIN D-3 PO Take 1 tablet by mouth daily.          Objective:    BP (!) 181/93   Pulse 83   Temp (!) 96.8 F (36 C) (Oral)   Ht 5\' 11"  (1.803 m)   Wt 149 lb 3.2 oz (67.7 kg)   BMI 20.81 kg/m   Wt Readings from Last 3 Encounters:  06/28/18 149 lb 3.2 oz (67.7 kg)  06/07/18 149 lb 9.6 oz (67.9 kg)  05/10/18 150 lb 6.4 oz (68.2 kg)    Physical Exam Vitals signs and nursing note reviewed.  Constitutional:      General: He is not in acute distress.    Appearance: He is well-developed. He is not diaphoretic.  HENT:     Right Ear: Tympanic membrane, ear canal and external ear normal.     Left Ear: Tympanic membrane, ear canal and external ear normal.     Nose: Mucosal edema and rhinorrhea present.     Right Sinus: Maxillary sinus tenderness present. No frontal sinus tenderness.     Left Sinus: Maxillary sinus tenderness present. No frontal sinus tenderness.     Mouth/Throat:     Pharynx: Uvula midline. Posterior oropharyngeal erythema present. No oropharyngeal exudate.     Tonsils: No tonsillar abscesses.  Eyes:     General: No scleral icterus.       Right eye: No discharge.     Conjunctiva/sclera: Conjunctivae normal.     Pupils: Pupils are equal, round, and reactive to light.  Neck:     Musculoskeletal: Neck supple.     Thyroid: No thyromegaly.  Cardiovascular:     Rate and Rhythm: Normal rate and regular rhythm.     Heart sounds: Normal heart sounds. No murmur.  Pulmonary:     Effort: Pulmonary effort is normal. No respiratory distress.     Breath sounds: Normal breath sounds. No wheezing or rales.  Musculoskeletal: Normal range of motion.  Lymphadenopathy:     Cervical: No cervical adenopathy.  Skin:    General: Skin is warm and dry.     Findings: No rash.  Neurological:     Mental Status: He is alert and oriented to person, place, and time.     Coordination: Coordination normal.  Psychiatric:        Behavior: Behavior normal.          Assessment & Plan:   Problem List Items Addressed This Visit      Cardiovascular and Mediastinum   Essential hypertension   Relevant Medications   amLODipine (NORVASC) 5 MG tablet    Other Visit Diagnoses    COPD exacerbation ([place])    -  Primary   Relevant Medications  albuterol (PROVENTIL HFA;VENTOLIN HFA) 108 (90 Base) MCG/ACT inhaler   predniSONE (DELTASONE) 20 MG tablet   azithromycin (ZITHROMAX) 250 MG tablet      We will start the patient on amlodipine for blood pressure  We will treat him for COPD exacerbation with albuterol prednisone and azithromycin Follow up plan: Return in about 4 weeks (around 07/26/2018), or if symptoms worsen or fail to improve, for Hypertension recheck.  Counseling provided for all of the vaccine components No orders of the defined types were placed in this encounter.   Caryl Pina, MD [facility] 06/28/2018, 9:18 AM

## 2018-07-05 ENCOUNTER — Ambulatory Visit (INDEPENDENT_AMBULATORY_CARE_PROVIDER_SITE_OTHER): Payer: PPO | Admitting: *Deleted

## 2018-07-05 ENCOUNTER — Encounter: Payer: Self-pay | Admitting: *Deleted

## 2018-07-05 VITALS — BP 151/80 | HR 89 | Ht 71.0 in | Wt 150.0 lb

## 2018-07-05 DIAGNOSIS — Z0000 Encounter for general adult medical examination without abnormal findings: Secondary | ICD-10-CM | POA: Diagnosis not present

## 2018-07-05 NOTE — Progress Notes (Signed)
Subjective:   JAMARI MOTEN is a 68 y.o. male who presents for Medicare Annual/Subsequent preventive examination.  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  He enjoys riding his motorcycle when he is off work.  Mr. Moncus lives with his wife, Stanton Kidney.  They have 4 children, 9 grand children, and 6 great grandchildren.  He feels his health is unchanged from last year.  He reports no ER visits, surgeries, or hospitalizations in the past year.   Review of Systems:    Cardiac Risk Factors include: advanced age (>81men, >56 women);dyslipidemia;sedentary lifestyle;male gender;hypertension;smoking/ tobacco exposure     Objective:    Vitals: BP (!) 151/80   Pulse 89   Ht 5\' 11"  (1.803 m)   Wt 150 lb (68 kg)   BMI 20.92 kg/m   Body mass index is 20.92 kg/m.  Advanced Directives 07/05/2018 08/04/2017 06/29/2017 01/19/2017 01/05/2017 01/07/2016 03/07/2013  Does Patient Have a Medical Advance Directive? No No No No No No Patient does not have advance directive  Would patient like information on creating a medical advance directive? Yes (MAU/Ambulatory/Procedural Areas - Information given) No - Patient declined No - Patient declined - - - -  Pre-existing out of facility DNR order (yellow form or pink MOST form) - - - - - - No    Tobacco Social History   Tobacco Use  Smoking Status Current Every Day Smoker  . Packs/day: 1.00  . Years: 40.00  . Pack years: 40.00  . Types: Cigarettes  Smokeless Tobacco Never Used     Ready to quit: No Counseling given: Yes   Clinical Intake:     Pain Score: 0-No pain                 Past Medical History:  Diagnosis Date  . Allergy   . Anxiety    Patient denies  . Arthritis   . CAD (coronary artery disease)    The EF was 55% with inferior hypokinesis. Cath 09  . Carotid stenosis   . Coronary artery disease    left main had luminal irregularities, the LAD had 30%  stenosis, the first diagonal had moderate-sized with ostial 35% stenosis, the circumflex in the AV groove had ostial 25% stenosis, the first obtuse marginal had long proximal 30% stenosis, the mid obtuse marginal was normal, the right coronary artery was dominant and occluded proximally.  There was excellent left to right collateral filling.     . Depression    Patient denies  . Diverticulitis   . Dyslipidemia   . Essential hypertension 08/24/2017  . GERD (gastroesophageal reflux disease)   . Lung nodule    benign  . Osteoporosis   . Tobacco abuse    Past Surgical History:  Procedure Laterality Date  . CAROTID ENDARTERECTOMY Right   . COLONOSCOPY    . LEFT HEART CATH AND CORONARY ANGIOGRAPHY N/A 08/04/2017   Procedure: LEFT HEART CATH AND CORONARY ANGIOGRAPHY;  Surgeon: Burnell Blanks, MD;  Location: [facility];  Service: Cardiovascular;  Laterality: N/A;  . left inguinal herniorrhaphy    . THROAT SURGERY     Family History  Problem Relation Age of Onset  . Lung disease Father   . Lupus Mother   . Lupus Brother   . Alcohol abuse Brother   . Lung disease Brother   . Cancer Brother        colon  .  Lung disease Brother   . Healthy Daughter   . Healthy Son   . Healthy Son   . Healthy Son   . Esophageal cancer Neg Hx   . Rectal cancer Neg Hx   . Stomach cancer Neg Hx   . Pancreatic cancer Neg Hx    Social History   Socioeconomic History  . Marital status: Married    Spouse name: [name]  . Number of children: 4  . Years of education: high school diploma  . Highest education level: 12th grade  Occupational History  . Occupation: truck Education administrator: [employer]  . Financial resource strain: Not hard at all  . Food insecurity:    Worry: Never true    Inability: Never true  . Transportation needs:    Medical: No    Non-medical: No  Tobacco Use  . Smoking status: Current Every Day Smoker    Packs/day: 1.00    Years: 40.00     Pack years: 40.00    Types: Cigarettes  . Smokeless tobacco: Never Used  Substance and Sexual Activity  . Alcohol use: Yes    Comment: rarely  . Drug use: No  . Sexual activity: Not on file  Lifestyle  . Physical activity:    Days per week: 0 days    Minutes per session: 0 min  . Stress: Rather much  Relationships  . Social connections:    Talks on phone: More than three times a week    Gets together: More than three times a week    Attends religious service: Never    Active member of club or organization: No    Attends meetings of clubs or organizations: Never    Relationship status: Married  Other Topics Concern  . Not on file  Social History Narrative   Lives with wife in a split level home.    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   Has 1 daughter, 3 sons, and many grandchildren.    Outpatient Encounter Medications as of 07/05/2018  Medication Sig  . albuterol (PROVENTIL HFA;VENTOLIN HFA) 108 (90 Base) MCG/ACT inhaler Inhale 2 puffs into the lungs every 4 (four) hours as needed for wheezing or shortness of breath.  Marland Kitchen amLODipine (NORVASC) 5 MG tablet Take 1 tablet (5 mg total) by mouth daily.  Marland Kitchen aspirin EC 81 MG tablet Take 81 mg by mouth daily.  . Cholecalciferol (VITAMIN D-3 PO) Take 1 tablet by mouth daily.   . Flaxseed, Linseed, (FLAXSEED OIL) 1000 MG CAPS Take 1,000 mg by mouth daily.   . fluticasone (FLONASE) 50 MCG/ACT nasal spray Place 2 sprays into both nostrils daily. (Patient taking differently: Place 2 sprays into both nostrils daily as needed (for allergies.). )  . Multiple Vitamin (MULTIVITAMIN WITH MINERALS) TABS tablet Take 1 tablet by mouth daily. Centrum Silver  . naproxen sodium (ALEVE) 220 MG tablet Take 440 mg by mouth 2 (two) times daily as needed (for pain.).  Marland Kitchen omeprazole (PRILOSEC) 20 MG capsule Take 20 mg by mouth daily.  . nitroGLYCERIN (NITROSTAT) 0.4 MG SL tablet Place 1 tablet (0.4 mg total) under the tongue every 5 (five) minutes as  needed for chest pain.  . [DISCONTINUED] azithromycin (ZITHROMAX) 250 MG tablet Take 2 the first day and then one each day after.  . [DISCONTINUED] predniSONE (DELTASONE) 20 MG tablet 2 po at same time daily for 5 days   No facility-administered encounter medications on file as of  07/05/2018.     Activities of Daily Living In your present state of health, do you have any difficulty performing the following activities: 07/05/2018  Hearing? N  Vision? N  Difficulty concentrating or making decisions? N  Walking or climbing stairs? N  Dressing or bathing? N  Doing errands, shopping? N  Preparing Food and eating ? N  Using the Toilet? N  In the past six months, have you accidently leaked urine? N  Do you have problems with loss of bowel control? N  Managing your Medications? N  Managing your Finances? N  Housekeeping or managing your Housekeeping? N  Some recent data might be hidden    Patient Care Team: Dettinger, Fransisca Kaufmann, MD as PCP - General (Family Medicine) Minus Breeding, MD as Consulting Physician (Cardiology)   Assessment:   This is a routine wellness examination for Imre.  Exercise Activities and Dietary recommendations  Mr. Eastham states he usually eats 2 meals per day and snacks throughout the day.  He usually has oatmeal for breakfast snacks on trail mix, peanut butter crakers, or blueberry muffins throughout the day and has a [place] and cheese sandwich for supper.  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  When he is home he usually has more variety for his meals.  Recommended increasing fruits, vegetables, and lean proteins in his diet.  Mr. Hoffmeier states he has access to all the food he needs.   Current Exercise Habits: Home exercise routine, Type of exercise: strength training/weights, Time (Minutes): 15, Frequency (Times/Week): 2, Weekly Exercise (Minutes/Week): 30, Intensity: Mild - he states he started this exercise regimen last week.  Goals    .  Exercise 150 min/wk Moderate Activity     Walking and lifting weights 3-5 times per week.     . Quit Smoking       Fall Risk Fall Risk  07/05/2018 06/28/2018 06/07/2018 10/26/2017 07/30/2017  Falls in the past year? 0 0 0 No No   Is the patient's home free of loose throw rugs in walkways, pet beds, electrical cords, etc?   yes      Grab bars in the bathroom? no      Handrails on the stairs?   yes      Adequate lighting?   yes    Depression Screen PHQ 2/9 Scores 07/05/2018 06/28/2018 06/07/2018 10/26/2017  PHQ - 2 Score 0 0 0 0    Cognitive Function MMSE - Mini Mental State Exam 07/05/2018 06/29/2017  Orientation to time 5 5  Orientation to Place 5 5  Registration 3 3  Attention/ Calculation 5 3  Recall 3 3  Language- name 2 objects 2 2  Language- repeat 1 1  Language- follow 3 step command 3 3  Language- read & follow direction 1 1  Write a sentence 1 1  Copy design 1 1  Total score 30 28        Immunization History  Administered Date(s) Administered  . Pneumococcal Conjugate-13 01/07/2016  . Pneumococcal Polysaccharide-23 06/29/2017    Qualifies for Shingles Vaccine? Yes, declined   Screening Tests Health Maintenance  Topic Date Due  . TETANUS/TDAP  12/14/1969  . INFLUENZA VACCINE  01/21/2018  . COLONOSCOPY  01/20/2027  . Hepatitis C Screening  Completed  . PNA vac Low Risk Adult  Completed   Tdap and influenza vaccines discussed - patient declined   Cancer Screenings: Lung: Low Dose CT Chest recommended if Age 16-80 years, 30 pack-year currently  smoking OR have quit w/in 15years. Patient does qualify. Colorectal: up to date  Additional Screenings:  Hepatitis C Screening: Completed 01/07/16     Plan:     Work on your goal of increasing your exercise to 3-5 times per week for 30 minutes to 1 hour.  Walking and lifting dumbbells are good options.  Review the information on Advance Directives.  If you complete the paperwork please bring a copy to our office to be  filed in  Your medical record.  Consider getting your flu, tetanus, and shingles vaccines.    I have personally reviewed and noted the following in the patient's chart:   . Medical and social history . Use of alcohol, tobacco or illicit drugs  . Current medications and supplements . Functional ability and status . Nutritional status . Physical activity . Advanced directives . List of other physicians . Hospitalizations, surgeries, and ER visits in previous 12 months . Vitals . Screenings to include cognitive, depression, and falls . Referrals and appointments  In addition, I have reviewed and discussed with patient certain preventive protocols, quality metrics, and best practice recommendations. A written personalized care plan for preventive services as well as general preventive health recommendations were provided to patient.     Denyce [name], RN  07/05/2018

## 2018-07-05 NOTE — Patient Instructions (Addendum)
Please work on your goal of increasing your exercise to 3-5 times per week for 30 minutes to 1 hour.  Walking and lifting dumbbells are good options.    Please review the information on Advance Directives.  If you complete the paperwork please bring a copy to our office to be filed in  Your medical record.   Please consider getting your flu, tetanus, and shingles vaccines.   Thank you for coming in for your Annual Wellness Visit.    Preventive Care 46 Years and Older, Male Preventive care refers to lifestyle choices and visits with your health care provider that can promote health and wellness. What does preventive care include?   A yearly physical exam. This is also called an annual well check.  Dental exams once or twice a year.  Routine eye exams. Ask your health care provider how often you should have your eyes checked.  Personal lifestyle choices, including: ? Daily care of your teeth and gums. ? Regular physical activity. ? Eating a healthy diet. ? Avoiding tobacco and drug use. ? Limiting alcohol use. ? Practicing safe sex. ? Taking low doses of aspirin every day. ? Taking vitamin and mineral supplements as recommended by your health care provider. What happens during an annual well check? The services and screenings done by your health care provider during your annual well check will depend on your age, overall health, lifestyle risk factors, and family history of disease. Counseling Your health care provider may ask you questions about your:  Alcohol use.  Tobacco use.  Drug use.  Emotional well-being.  Home and relationship well-being.  Sexual activity.  Eating habits.  History of falls.  Memory and ability to understand (cognition).  Work and work Statistician. Screening You may have the following tests or measurements:  Height, weight, and BMI.  Blood pressure.  Lipid and cholesterol levels. These may be checked every 5 years, or more frequently  if you are over 12 years old.  Skin check.  Lung cancer screening. You may have this screening every year starting at age 33 if you have a 30-pack-year history of smoking and currently smoke or have quit within the past 15 years.  Colorectal cancer screening. All adults should have this screening starting at age 72 and continuing until age 67. You will have tests every 1-10 years, depending on your results and the type of screening test. People at increased risk should start screening at an earlier age. Screening tests may include: ? Guaiac-based fecal occult blood testing. ? Fecal immunochemical test (FIT). ? Stool DNA test. ? Virtual colonoscopy. ? Sigmoidoscopy. During this test, a flexible tube with a tiny camera (sigmoidoscope) is used to examine your rectum and lower colon. The sigmoidoscope is inserted through your anus into your rectum and lower colon. ? Colonoscopy. During this test, a long, thin, flexible tube with a tiny camera (colonoscope) is used to examine your entire colon and rectum.  Prostate cancer screening. Recommendations will vary depending on your family history and other risks.  Hepatitis C blood test.  Hepatitis B blood test.  Sexually transmitted disease (STD) testing.  Diabetes screening. This is done by checking your blood sugar (glucose) after you have not eaten for a while (fasting). You may have this done every 1-3 years.  Abdominal aortic aneurysm (AAA) screening. You may need this if you are a current or former smoker.  Osteoporosis. You may be screened starting at age 6 if you are at high risk. Talk with  your health care provider about your test results, treatment options, and if necessary, the need for more tests. Vaccines Your health care provider may recommend certain vaccines, such as:  Influenza vaccine. This is recommended every year.  Tetanus, diphtheria, and acellular pertussis (Tdap, Td) vaccine. You may need a Td booster every 10  years.  Varicella vaccine. You may need this if you have not been vaccinated.  Zoster vaccine. You may need this after age 30.  Measles, mumps, and rubella (MMR) vaccine. You may need at least one dose of MMR if you were born in 1957 or later. You may also need a second dose.  Pneumococcal 13-valent conjugate (PCV13) vaccine. One dose is recommended after age 11.  Pneumococcal polysaccharide (PPSV23) vaccine. One dose is recommended after age 46.  Meningococcal vaccine. You may need this if you have certain conditions.  Hepatitis A vaccine. You may need this if you have certain conditions or if you travel or work in places where you may be exposed to hepatitis A.  Hepatitis B vaccine. You may need this if you have certain conditions or if you travel or work in places where you may be exposed to hepatitis B.  Haemophilus influenzae type b (Hib) vaccine. You may need this if you have certain risk factors. Talk to your health care provider about which screenings and vaccines you need and how often you need them. This information is not intended to replace advice given to you by your health care provider. Make sure you discuss any questions you have with your health care provider. Document Released: 07/06/2015 Document Revised: 07/30/2017 Document Reviewed: 04/10/2015 Elsevier Interactive Patient Education  2019 [employer].

## 2018-07-14 ENCOUNTER — Ambulatory Visit: Payer: PPO

## 2018-07-14 NOTE — Progress Notes (Deleted)
Patient ID: [name] [name]                 DOB: 1950/08/23                    MRN: [id number]     HPI: [name] [name] is a 68 y.o. male patient referred to lipid clinic by DR [facility]. PMH is significant for CAD, unstable angina, carotid stenosis, depression, dyslipidemia, and multiple statin intolerance.  Patient presents to clinic for potential PCSK9i initiation.   Current Medications: nonoe  Intolerances:   atorvastatin 10mg  daily  pravastatin 20mg  daily  pitavastatin 2mg  daily  Simvastatin 10mg  daily  LDL goal: <70 mg/dL  Diet:   Exercise:   Family History:   Social History:   Labs:  Past Medical History:  Diagnosis Date  . Allergy   . Anxiety    Patient denies  . Arthritis   . CAD (coronary artery disease)    The EF was 55% with inferior hypokinesis. Cath 09  . Carotid stenosis   . Coronary artery disease    left main had luminal irregularities, the LAD had 30% stenosis, the first diagonal had moderate-sized with ostial 35% stenosis, the circumflex in the AV groove had ostial 25% stenosis, the first obtuse marginal had long proximal 30% stenosis, the mid obtuse marginal was normal, the right coronary artery was dominant and occluded proximally.  There was excellent left to right collateral filling.     . Depression    Patient denies  . Diverticulitis   . Dyslipidemia   . Essential hypertension 08/24/2017  . GERD (gastroesophageal reflux disease)   . Lung nodule    benign  . Osteoporosis   . Tobacco abuse     Current Outpatient Medications on File Prior to Visit  Medication Sig Dispense Refill  . albuterol (PROVENTIL HFA;VENTOLIN HFA) 108 (90 Base) MCG/ACT inhaler Inhale 2 puffs into the lungs every 4 (four) hours as needed for wheezing or shortness of breath. 1 Inhaler 2  . amLODipine (NORVASC) 5 MG tablet Take 1 tablet (5 mg total) by mouth daily. 90 tablet 3  . aspirin EC 81 MG tablet Take 81 mg by mouth daily.    . Cholecalciferol (VITAMIN D-3 PO)  Take 1 tablet by mouth daily.     . Flaxseed, Linseed, (FLAXSEED OIL) 1000 MG CAPS Take 1,000 mg by mouth daily.     . fluticasone (FLONASE) 50 MCG/ACT nasal spray Place 2 sprays into both nostrils daily. (Patient taking differently: Place 2 sprays into both nostrils daily as needed (for allergies.). ) 16 g 0  . Multiple Vitamin (MULTIVITAMIN WITH MINERALS) TABS tablet Take 1 tablet by mouth daily. Centrum Silver    . naproxen sodium (ALEVE) 220 MG tablet Take 440 mg by mouth 2 (two) times daily as needed (for pain.).    Marland Kitchen nitroGLYCERIN (NITROSTAT) 0.4 MG SL tablet Place 1 tablet (0.4 mg total) under the tongue every 5 (five) minutes as needed for chest pain. 25 tablet 3  . omeprazole (PRILOSEC) 20 MG capsule Take 20 mg by mouth daily.     No current facility-administered medications on file prior to visit.     Allergies  Allergen Reactions  . Crestor [Rosuvastatin Calcium] Other (See Comments)    Lethargy and muscle aches  . Flomax [Tamsulosin Hcl] Other (See Comments)    Made patient hypotensive, clammy, sweaty, and feel like he was going to pass out (EMS HAD TO BE CALLED)  . Lipitor [  Atorvastatin Calcium] Other (See Comments)    Body cramps, lethargy and muscle aches    No problem-specific Assessment & Plan notes found for this encounter.      Levis Nazir Rodriguez-Guzman PharmD, BCPS, [place] [address] 07/14/2018 7:35 AM

## 2018-07-30 ENCOUNTER — Other Ambulatory Visit: Payer: Self-pay

## 2018-07-30 ENCOUNTER — Telehealth: Payer: Self-pay | Admitting: Cardiology

## 2018-07-30 NOTE — Telephone Encounter (Signed)
ANA AT [facility] AWARE PT WAS ON LIVALO IN November THIS THE LAST TIME WE HAVE SEEN PT .Adonis Housekeeper

## 2018-07-30 NOTE — Telephone Encounter (Signed)
Calling to make sure patient is not supposed to be on a cholesterol medication.  Pt states he is not taking any of his meds

## 2018-07-30 NOTE — Patient Outreach (Signed)
[facility]) Care Management  07/30/2018  [name] [name] [birth year] [id number]   Medication Adherence call to [name] [name] Hippa Idenfiers verify,Spoke with patient he explain he stop taking this medication because of side effects( swollen feet,knee pain) and his PCP told him to stop taking this medication,patient also said he will not take any cholesterol medication,he has try most of the cholesterol medication and had side effect on most of then, left a message at doctor office.   [facility] Management Direct Dial [phone]  Fax [phone] [name]

## 2018-08-09 ENCOUNTER — Ambulatory Visit: Payer: PPO

## 2018-08-09 ENCOUNTER — Ambulatory Visit: Payer: PPO | Admitting: Cardiology

## 2018-08-09 NOTE — Progress Notes (Deleted)
Patient ID: [name] [name]                 DOB: 1951/01/08                    MRN: [id number]     HPI: [name] [name] is a 68 y.o. male patient referred to lipid clinic by Dr [name] [name]. PMH is significant for chest pain, CAD, carotid stenosis, unstable angina,and hypertension. Noted intolerance to multiple statins including pituvastatin 2mg  daily, atorvastatin 10mg  daily, and pravastatin 20mg  daily.   Current Medications: none  Intolerances:  Atorvastatin 10mg  daily pituvastatin 2mg  daily Pravastatin 20mg  daily  LDL goal: < 70mg /dL   Diet:   Exercise:   Family History:   Social History:   Labs: 06/17/2018: CHO 167; TG 78; HDL 54; LDL-c 97   Past Medical History:  Diagnosis Date  . Allergy   . Anxiety    Patient denies  . Arthritis   . CAD (coronary artery disease)    The EF was 55% with inferior hypokinesis. Cath 09  . Carotid stenosis   . Coronary artery disease    left main had luminal irregularities, the LAD had 30% stenosis, the first diagonal had moderate-sized with ostial 35% stenosis, the circumflex in the AV groove had ostial 25% stenosis, the first obtuse marginal had long proximal 30% stenosis, the mid obtuse marginal was normal, the right coronary artery was dominant and occluded proximally.  There was excellent left to right collateral filling.     . Depression    Patient denies  . Diverticulitis   . Dyslipidemia   . Essential hypertension 08/24/2017  . GERD (gastroesophageal reflux disease)   . Lung nodule    benign  . Osteoporosis   . Tobacco abuse     Current Outpatient Medications on File Prior to Visit  Medication Sig Dispense Refill  . albuterol (PROVENTIL HFA;VENTOLIN HFA) 108 (90 Base) MCG/ACT inhaler Inhale 2 puffs into the lungs every 4 (four) hours as needed for wheezing or shortness of breath. 1 Inhaler 2  . amLODipine (NORVASC) 5 MG tablet Take 1 tablet (5 mg total) by mouth daily. 90 tablet 3  . aspirin EC 81 MG tablet Take 81 mg by  mouth daily.    . Cholecalciferol (VITAMIN D-3 PO) Take 1 tablet by mouth daily.     . Flaxseed, Linseed, (FLAXSEED OIL) 1000 MG CAPS Take 1,000 mg by mouth daily.     . fluticasone (FLONASE) 50 MCG/ACT nasal spray Place 2 sprays into both nostrils daily. (Patient taking differently: Place 2 sprays into both nostrils daily as needed (for allergies.). ) 16 g 0  . Multiple Vitamin (MULTIVITAMIN WITH MINERALS) TABS tablet Take 1 tablet by mouth daily. Centrum Silver    . naproxen sodium (ALEVE) 220 MG tablet Take 440 mg by mouth 2 (two) times daily as needed (for pain.).    Marland Kitchen nitroGLYCERIN (NITROSTAT) 0.4 MG SL tablet Place 1 tablet (0.4 mg total) under the tongue every 5 (five) minutes as needed for chest pain. 25 tablet 3  . omeprazole (PRILOSEC) 20 MG capsule Take 20 mg by mouth daily.     No current facility-administered medications on file prior to visit.     Allergies  Allergen Reactions  . Crestor [Rosuvastatin Calcium] Other (See Comments)    Lethargy and muscle aches  . Flomax [Tamsulosin Hcl] Other (See Comments)    Made patient hypotensive, clammy, sweaty, and feel like he was going to pass out (  EMS HAD TO BE CALLED)  . Lipitor [Atorvastatin Calcium] Other (See Comments)    Body cramps, lethargy and muscle aches    No problem-specific Assessment & Plan notes found for this encounter.      Maryl Blalock Rodriguez-Guzman PharmD, BCPS, [place] [id number] 08/09/2018 12:57 PM

## 2018-08-30 ENCOUNTER — Encounter: Payer: Self-pay | Admitting: Family Medicine

## 2018-08-30 ENCOUNTER — Ambulatory Visit (INDEPENDENT_AMBULATORY_CARE_PROVIDER_SITE_OTHER): Payer: PPO | Admitting: Family Medicine

## 2018-08-30 ENCOUNTER — Ambulatory Visit (INDEPENDENT_AMBULATORY_CARE_PROVIDER_SITE_OTHER): Payer: PPO

## 2018-08-30 VITALS — BP 156/81 | HR 83 | Temp 97.0°F | Ht 71.0 in | Wt 149.2 lb

## 2018-08-30 DIAGNOSIS — M25572 Pain in left ankle and joints of left foot: Secondary | ICD-10-CM

## 2018-08-30 DIAGNOSIS — S93412A Sprain of calcaneofibular ligament of left ankle, initial encounter: Secondary | ICD-10-CM

## 2018-08-30 DIAGNOSIS — M7989 Other specified soft tissue disorders: Secondary | ICD-10-CM | POA: Diagnosis not present

## 2018-08-30 DIAGNOSIS — S99912A Unspecified injury of left ankle, initial encounter: Secondary | ICD-10-CM | POA: Diagnosis not present

## 2018-08-30 DIAGNOSIS — T1490XA Injury, unspecified, initial encounter: Secondary | ICD-10-CM

## 2018-08-30 NOTE — Progress Notes (Signed)
Chief Complaint  Patient presents with  . Ankle Pain    pt here today c/o left ankle pain after falling yesterday and twisting ankle    HPI  Patient presents today for ankle pain.  [place] forward over the edge of a sidewalk and twisted it yesterday.  Date of injury is 08/29/2018.  Can bear weight, but painful. Some relief with aleve. Very painful and swollen at the lateral malleolus.   PMH: Smoking status noted ROS: Per HPI  Objective: BP (!) 156/81   Pulse 83   Temp (!) 97 F (36.1 C) (Oral)   Ht 5\' 11"  (1.803 m)   Wt 149 lb 4 oz (67.7 kg)   BMI 20.82 kg/m  Gen: NAD, alert, cooperative with exam Ext: The left foot is swollen primarily at the hindfoot and midfoot laterally.  There is swelling and bruising at the lateral malleolar gutter.  It is tender for all range of motion.  It opens slightly to inversion.  The left lower extremity is neurovascularly intact.   Neuro: Alert and oriented, No gross deficits  Assessment and plan:  1. Injury   2. Acute left ankle pain   3. Sprain of calcaneofibular ligament of left ankle, initial encounter     Take Aleve 2 twice daily as needed for pain.  Wear the stirrup brace when ambulating.  When bearing weight.  Elevate and use ice otherwise.  Orders Placed This Encounter  Procedures  . DG Ankle Complete Left    Standing Status:   Future    Number of Occurrences:   1    Standing Expiration Date:   10/30/2019    Order Specific Question:   Reason for Exam (SYMPTOM  OR DIAGNOSIS REQUIRED)    Answer:   fall    Order Specific Question:   Preferred imaging location?    Answer:   Internal    Follow up 2 weeks if symptoms have not resolved completely.  Wear the brace during that time can wean towards the end of 2 weeks based on pain.  Left ankle sprain  Claretta Fraise, MD

## 2018-09-13 ENCOUNTER — Ambulatory Visit (INDEPENDENT_AMBULATORY_CARE_PROVIDER_SITE_OTHER): Payer: PPO | Admitting: Family Medicine

## 2018-09-13 ENCOUNTER — Ambulatory Visit: Payer: PPO | Admitting: Family Medicine

## 2018-09-13 ENCOUNTER — Other Ambulatory Visit: Payer: Self-pay

## 2018-09-13 ENCOUNTER — Encounter: Payer: Self-pay | Admitting: Family Medicine

## 2018-09-13 VITALS — BP 164/77 | HR 90 | Temp 97.0°F | Ht 71.0 in | Wt 148.4 lb

## 2018-09-13 DIAGNOSIS — S93492D Sprain of other ligament of left ankle, subsequent encounter: Secondary | ICD-10-CM

## 2018-09-13 NOTE — Progress Notes (Signed)
BP (!) 164/77   Pulse 90   Temp (!) 97 F (36.1 C) (Oral)   Ht 5\' 11"  (1.803 m)   Wt 148 lb 6.4 oz (67.3 kg)   BMI 20.70 kg/m    Subjective:   Patient ID: [name] [name], male    DOB: [birth year], 68 y.o.   MRN: [id number]  HPI: [name] [name] is a 68 y.o. male presenting on 09/13/2018 for Ankle Pain (Left- Patient was seen 3/9 - follow up)   HPI Left ankle pain Patient comes in complaining of left ankle pain/sprain.  He was seen 2 weeks ago for similar and has been wearing a brace since that time.  He says he still having swelling and pain although it has improved.  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  He has been using some Advil and some ice and has been wearing a brace since he was seen.  Relevant past medical, surgical, family and social history reviewed and updated as indicated. Interim medical history since our last visit reviewed. Allergies and medications reviewed and updated.  Review of Systems  Constitutional: Negative for chills and fever.  Eyes: Negative for visual disturbance.  Respiratory: Negative for shortness of breath and wheezing.   Cardiovascular: Negative for chest pain and leg swelling.  Musculoskeletal: Positive for arthralgias and joint swelling. Negative for back pain and gait problem.  Skin: Negative for color change and rash.  Neurological: Positive for weakness. Negative for numbness.  All other systems reviewed and are negative.   Per HPI unless specifically indicated above    Objective:   BP (!) 164/77   Pulse 90   Temp (!) 97 F (36.1 C) (Oral)   Ht 5\' 11"  (1.803 m)   Wt 148 lb 6.4 oz (67.3 kg)   BMI 20.70 kg/m   Wt Readings from Last 3 Encounters:  09/13/18 148 lb 6.4 oz (67.3 kg)  08/30/18 149 lb 4 oz (67.7 kg)  07/05/18 150 lb (68 kg)    Physical Exam Vitals signs and nursing note reviewed.  Constitutional:      General:  He is not in acute distress.    Appearance: He is well-developed. He is not diaphoretic.  Eyes:     General: No scleral icterus.    Conjunctiva/sclera: Conjunctivae normal.  Neck:     Thyroid: No thyromegaly.  Musculoskeletal: Normal range of motion.     Left foot: Normal range of motion. Tenderness present. No bony tenderness.       Feet:  Skin:    General: Skin is warm and dry.     Findings: No rash.  Neurological:     Mental Status: He is alert and oriented to person, place, and time.     Gait: Gait abnormal.  Psychiatric:        Behavior: Behavior normal.       Assessment & Plan:   Problem List Items Addressed This Visit    None    Visit Diagnoses    Sprain of anterior talofibular ligament of left ankle, subsequent encounter    -  Primary      Reassurance that it is still normal to be healing at this point, recommended for patient to do strengthening exercises including ABCs and range of motion and balance exercises without ankle and toe lifts and exercise bands with that ankle.  Follow up plan:  Return if symptoms worsen or fail to improve.  Counseling provided for all of the vaccine components No orders of the defined types were placed in this encounter.   Caryl Pina, MD [facility] 09/13/2018, 9:13 AM

## 2018-10-27 ENCOUNTER — Ambulatory Visit (INDEPENDENT_AMBULATORY_CARE_PROVIDER_SITE_OTHER): Payer: PPO | Admitting: Family Medicine

## 2018-10-27 ENCOUNTER — Other Ambulatory Visit: Payer: Self-pay

## 2018-10-27 ENCOUNTER — Encounter: Payer: Self-pay | Admitting: Family Medicine

## 2018-10-27 DIAGNOSIS — N200 Calculus of kidney: Secondary | ICD-10-CM | POA: Diagnosis not present

## 2018-10-27 NOTE — Progress Notes (Signed)
Virtual Visit via telephone Note  I connected with [name] [name] on 10/27/18 at 1642 by telephone and verified that I am speaking with the correct person using two identifiers. 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 The provider, Fransisca Kaufmann Dettinger, MD is located in their office at time of visit.  Call ended at 1650  I discussed the limitations, risks, security and privacy concerns of performing an evaluation and management service by telephone and the availability of in person appointments. I also discussed with the patient that there may be a patient responsible charge related to this service. The patient expressed understanding and agreed to proceed.   History and Present Illness: Patient is having pain in left lower back and it is killing him.  He had one 4 years ago that required lithotripsy.  It just started yesterday but it is not improving.  Patient denies dysuria or hematuria. Patient denies any changes in bowel movements or nausea or vomiting. Walking around or laying down helps but in truck it is worse.  Patient has been having cranberry juice  No diagnosis found.  Outpatient Encounter Medications as of 10/27/2018  Medication Sig  . albuterol (PROVENTIL HFA;VENTOLIN HFA) 108 (90 Base) MCG/ACT inhaler Inhale 2 puffs into the lungs every 4 (four) hours as needed for wheezing or shortness of breath.  Marland Kitchen amLODipine (NORVASC) 5 MG tablet Take 1 tablet (5 mg total) by mouth daily.  Marland Kitchen aspirin EC 81 MG tablet Take 81 mg by mouth daily.  . Cholecalciferol (VITAMIN D-3 PO) Take 1 tablet by mouth daily.   . Flaxseed, Linseed, (FLAXSEED OIL) 1000 MG CAPS Take 1,000 mg by mouth daily.   . fluticasone (FLONASE) 50 MCG/ACT nasal spray Place 2 sprays into both nostrils daily. (Patient taking differently: Place 2 sprays into both nostrils daily as needed (for allergies.). )  . Multiple Vitamin (MULTIVITAMIN WITH  MINERALS) TABS tablet Take 1 tablet by mouth daily. Centrum Silver  . naproxen sodium (ALEVE) 220 MG tablet Take 440 mg by mouth 2 (two) times daily as needed (for pain.).  Marland Kitchen nitroGLYCERIN (NITROSTAT) 0.4 MG SL tablet Place 1 tablet (0.4 mg total) under the tongue every 5 (five) minutes as needed for chest pain.  Marland Kitchen omeprazole (PRILOSEC) 20 MG capsule Take 20 mg by mouth daily.   No facility-administered encounter medications on file as of 10/27/2018.     Review of Systems  Constitutional: Negative for chills and fever.  Respiratory: Negative for shortness of breath and wheezing.   Cardiovascular: Negative for chest pain and leg swelling.  Gastrointestinal: Negative for abdominal pain, constipation, diarrhea, nausea and vomiting.  Genitourinary: Positive for flank pain. Negative for decreased urine volume, dysuria, frequency and urgency.  Musculoskeletal: Negative for back pain and gait problem.  Skin: Negative for rash.  All other systems reviewed and are negative.   Observations/Objective: Patient sounds comfortable and in no acute distress  Assessment and Plan: Problem List Items Addressed This Visit    None    Visit Diagnoses    Renal stones    -  Primary       Follow Up Instructions:  Patient did not want pain medication and said he will use ibuprofen.  He is hydrating already.  I would continue with that.  He says he has an allergy to the tamsulosin because of a near syncopal episode and cannot take that so he will try the hydration and  will call us if it is worse and we may consider CT scan if it continues to get worse in the future.  He will likely need to come in.   I discussed the assessment and treatment plan with the patient. The patient was provided an opportunity to ask questions and all were answered. The patient agreed with the plan and demonstrated an understanding of the instructions.   The patient was advised to call back or seek an in-person evaluation if the  symptoms worsen or if the condition fails to improve as anticipated.  The above assessment and management plan was discussed with the patient. The patient verbalized understanding of and has agreed to the management plan. Patient is aware to call the clinic if symptoms persist or worsen. Patient is aware when to return to the clinic for a follow-up visit. Patient educated on when it is appropriate to go to the emergency department.    I provided 8 minutes of non-face-to-face time during this encounter.    Worthy Rancher, MD

## 2018-10-28 ENCOUNTER — Telehealth: Payer: Self-pay

## 2018-10-28 MED ORDER — CEPHALEXIN 500 MG PO CAPS: 500.0000 mg | ORAL_CAPSULE | Freq: Four times a day (QID) | ORAL | 0 refills | Status: DC

## 2018-10-28 NOTE — Telephone Encounter (Signed)
Patient states he talked to Dr. Warrick Parisian yesterday and now thinks he has a bladder infection. States it is now starting to burn, having back pain and also states his urine is now dark with a little red to it. Please advise

## 2018-10-28 NOTE — Telephone Encounter (Signed)
Pt aware rx sent in. 

## 2018-10-28 NOTE — Telephone Encounter (Signed)
That is a possibility with the symptoms especially if they have progressed, I sent in [place] for him

## 2018-11-03 ENCOUNTER — Other Ambulatory Visit: Payer: Self-pay

## 2018-11-04 ENCOUNTER — Ambulatory Visit (INDEPENDENT_AMBULATORY_CARE_PROVIDER_SITE_OTHER): Payer: PPO | Admitting: Family Medicine

## 2018-11-04 ENCOUNTER — Other Ambulatory Visit: Payer: Self-pay

## 2018-11-04 ENCOUNTER — Encounter: Payer: Self-pay | Admitting: Family Medicine

## 2018-11-04 VITALS — BP 167/86 | HR 84 | Temp 97.1°F | Ht 71.0 in | Wt 149.6 lb

## 2018-11-04 DIAGNOSIS — Z0001 Encounter for general adult medical examination with abnormal findings: Secondary | ICD-10-CM

## 2018-11-04 DIAGNOSIS — N200 Calculus of kidney: Secondary | ICD-10-CM | POA: Diagnosis not present

## 2018-11-04 DIAGNOSIS — E039 Hypothyroidism, unspecified: Secondary | ICD-10-CM

## 2018-11-04 DIAGNOSIS — Z0000 Encounter for general adult medical examination without abnormal findings: Secondary | ICD-10-CM | POA: Diagnosis not present

## 2018-11-04 DIAGNOSIS — Z125 Encounter for screening for malignant neoplasm of prostate: Secondary | ICD-10-CM | POA: Diagnosis not present

## 2018-11-04 DIAGNOSIS — R7989 Other specified abnormal findings of blood chemistry: Secondary | ICD-10-CM | POA: Diagnosis not present

## 2018-11-04 DIAGNOSIS — I25119 Atherosclerotic heart disease of native coronary artery with unspecified angina pectoris: Secondary | ICD-10-CM

## 2018-11-04 DIAGNOSIS — E785 Hyperlipidemia, unspecified: Secondary | ICD-10-CM

## 2018-11-04 DIAGNOSIS — I10 Essential (primary) hypertension: Secondary | ICD-10-CM | POA: Diagnosis not present

## 2018-11-04 DIAGNOSIS — R1030 Lower abdominal pain, unspecified: Secondary | ICD-10-CM | POA: Diagnosis not present

## 2018-11-04 DIAGNOSIS — E038 Other specified hypothyroidism: Secondary | ICD-10-CM

## 2018-11-04 LAB — URINALYSIS, COMPLETE
Bilirubin, UA: NEGATIVE
Glucose, UA: NEGATIVE
Ketones, UA: NEGATIVE
Leukocytes,UA: NEGATIVE
Nitrite, UA: NEGATIVE
Protein,UA: NEGATIVE
Specific Gravity, UA: 1.02 (ref 1.005–1.030)
Urobilinogen, Ur: 0.2 mg/dL (ref 0.2–1.0)
pH, UA: 6 (ref 5.0–7.5)

## 2018-11-04 LAB — MICROSCOPIC EXAMINATION
Bacteria, UA: NONE SEEN
Epithelial Cells (non renal): NONE SEEN /hpf (ref 0–10)
Renal Epithel, UA: NONE SEEN /hpf
WBC, UA: NONE SEEN /hpf (ref 0–5)

## 2018-11-04 MED ORDER — TRAMADOL HCL 50 MG PO TABS: 50.0000 mg | ORAL_TABLET | Freq: Three times a day (TID) | ORAL | 0 refills | Status: AC | PRN

## 2018-11-04 MED ORDER — LISINOPRIL 20 MG PO TABS: 20.0000 mg | ORAL_TABLET | Freq: Every day | ORAL | 3 refills | Status: DC

## 2018-11-04 NOTE — Progress Notes (Signed)
BP (!) 167/86   Pulse 84   Temp (!) 97.1 F (36.2 C) (Oral)   Ht '5\' 11"'  (1.803 m)   Wt 149 lb 9.6 oz (67.9 kg)   BMI 20.86 kg/m    Subjective:   Patient ID: [name] [name], male    DOB: 1951-03-14, 68 y.o.   MRN: [id number]  HPI: [name] [name] is a 68 y.o. male presenting on 11/04/2018 for Annual Exam and Groin Pain (Patient states it has been ongoing)   HPI Well adult exam Patient is coming in today for well adult exam and recheck of chronic issues.  He has hypertension and cholesterol and thyroid and CAD and his blood pressure is up today because he stopped his amlodipine because he could not tolerate it.  He says at home it is running typically in the 150s over 103 like it was earlier today.  Patient says he is having some left groin pain/lower abdominal pain that last week was up in his left flank but now has come around to the front.  He is on Keflex for a urinary tract infection and was told that he possibly had kidney stones but did not know for sure and is bringing that up today as well.  He denies any blood in his urine but has been having urinary frequency.  He also has some inguinal pain that he had previously from a hernia repair that had to be re-repaired twice.  Patient denies any chest pain or shortness of breath today.  Relevant past medical, surgical, family and social history reviewed and updated as indicated. Interim medical history since our last visit reviewed. Allergies and medications reviewed and updated.  Review of Systems  Constitutional: Negative for chills and fever.  Respiratory: Negative for shortness of breath and wheezing.   Cardiovascular: Negative for chest pain and leg swelling.  Gastrointestinal: Positive for abdominal pain. Negative for abdominal distention, constipation, diarrhea, nausea and vomiting.  Genitourinary: Positive for frequency. Negative for difficulty urinating, dysuria, flank pain and hematuria.  Musculoskeletal: Negative for  back pain and gait problem.  Skin: Negative for rash.  All other systems reviewed and are negative.   Per HPI unless specifically indicated above   Allergies as of 11/04/2018      Reactions   Crestor [rosuvastatin Calcium] Other (See Comments)   Lethargy and muscle aches   Flomax [tamsulosin Hcl] Other (See Comments)   Made patient hypotensive, clammy, sweaty, and feel like he was going to pass out (EMS HAD TO BE CALLED)   Lipitor [atorvastatin Calcium] Other (See Comments)   Body cramps, lethargy and muscle aches      Medication List       Accurate as of Nov 04, 2018  3:00 PM. If you have any questions, ask your nurse or doctor.        albuterol 108 (90 Base) MCG/ACT inhaler Commonly known as:  VENTOLIN HFA Inhale 2 puffs into the lungs every 4 (four) hours as needed for wheezing or shortness of breath.   amLODipine 5 MG tablet Commonly known as:  NORVASC Take 1 tablet (5 mg total) by mouth daily.   aspirin EC 81 MG tablet Take 81 mg by mouth daily.   cephALEXin 500 MG capsule Commonly known as:  KEFLEX Take 1 capsule (500 mg total) by mouth 4 (four) times daily.   Flaxseed Oil 1000 MG Caps Take 1,000 mg by mouth daily.   fluticasone 50 MCG/ACT nasal spray Commonly known as:  FLONASE  Place 2 sprays into both nostrils daily. What changed:    when to take this  reasons to take this   lisinopril 20 MG tablet Commonly known as:  ZESTRIL Take 1 tablet (20 mg total) by mouth daily. Started by:  Worthy Rancher, MD   multivitamin with minerals Tabs tablet Take 1 tablet by mouth daily. Centrum Silver   naproxen sodium 220 MG tablet Commonly known as:  ALEVE Take 440 mg by mouth 2 (two) times daily as needed (for pain.).   nitroGLYCERIN 0.4 MG SL tablet Commonly known as:  NITROSTAT Place 1 tablet (0.4 mg total) under the tongue every 5 (five) minutes as needed for chest pain.   omeprazole 20 MG capsule Commonly known as:  PRILOSEC Take 20 mg by mouth  daily.   traMADol 50 MG tablet Commonly known as:  ULTRAM Take 1 tablet (50 mg total) by mouth every 8 (eight) hours as needed for up to 5 days. Started by:  Fransisca Kaufmann Dettinger, MD   VITAMIN D-3 PO Take 1 tablet by mouth daily.        Objective:   BP (!) 167/86   Pulse 84   Temp (!) 97.1 F (36.2 C) (Oral)   Ht '5\' 11"'  (1.803 m)   Wt 149 lb 9.6 oz (67.9 kg)   BMI 20.86 kg/m   Wt Readings from Last 3 Encounters:  11/04/18 149 lb 9.6 oz (67.9 kg)  09/13/18 148 lb 6.4 oz (67.3 kg)  08/30/18 149 lb 4 oz (67.7 kg)    Physical Exam Vitals signs and nursing note reviewed.  Constitutional:      General: He is not in acute distress.    Appearance: He is well-developed. He is not diaphoretic.  Eyes:     General: No scleral icterus.    Conjunctiva/sclera: Conjunctivae normal.  Neck:     Musculoskeletal: Neck supple.     Thyroid: No thyromegaly.  Cardiovascular:     Rate and Rhythm: Normal rate and regular rhythm.     Heart sounds: Normal heart sounds. No murmur.  Pulmonary:     Effort: Pulmonary effort is normal. No respiratory distress.     Breath sounds: Normal breath sounds. No wheezing.  Abdominal:     General: Abdomen is flat. Bowel sounds are normal. There is no distension.     Tenderness: There is abdominal tenderness (Left lower quadrant and suprapubic tenderness). There is no right CVA tenderness, left CVA tenderness, guarding or rebound.     Hernia: No hernia is present.  Musculoskeletal: Normal range of motion.  Lymphadenopathy:     Cervical: No cervical adenopathy.  Skin:    General: Skin is warm and dry.     Findings: No rash.  Neurological:     Mental Status: He is alert and oriented to person, place, and time.     Coordination: Coordination normal.  Psychiatric:        Behavior: Behavior normal.     Urinalysis: 3-10 RBCs and 1+ blood otherwise negative.  Assessment & Plan:   Problem List Items Addressed This Visit      Cardiovascular and  Mediastinum   Coronary artery disease involving native coronary artery of native heart with angina pectoris (HCC)   Relevant Medications   lisinopril (ZESTRIL) 20 MG tablet   Essential hypertension   Relevant Medications   lisinopril (ZESTRIL) 20 MG tablet   Other Relevant Orders   CMP14+EGFR   BMP8+EGFR     Endocrine   Subclinical  hypothyroidism   Relevant Orders   TSH     Other   HLD (hyperlipidemia)   Relevant Medications   lisinopril (ZESTRIL) 20 MG tablet   Other Relevant Orders   Lipid panel    Other Visit Diagnoses    Well adult exam    -  Primary   Relevant Orders   CBC with Differential/Platelet   CMP14+EGFR   Lipid panel   TSH   PSA, total and free   Renal stone       Relevant Medications   traMADol (ULTRAM) 50 MG tablet   Other Relevant Orders   Urinalysis, Complete   Prostate cancer screening       Relevant Orders   PSA, total and free      Patient says he did not tolerate amlodipine because he says it causes memory to fail and will start on lisinopril instead.  Gave some tramadol for suspected renal stone, patient is already on Keflex from phone visit and will finish that. Follow up plan: Return in about 6 months (around 05/07/2019), or if symptoms worsen or fail to improve, for Hypertension recheck.  Counseling provided for all of the vaccine components Orders Placed This Encounter  Procedures  . Urinalysis, Complete  . CBC with Differential/Platelet  . CMP14+EGFR  . Lipid panel  . TSH  . PSA, total and free  . BMP8+EGFR    Caryl Pina, MD [facility] 11/04/2018, 3:00 PM

## 2018-11-04 NOTE — Patient Instructions (Signed)
Return in 2 to 3 weeks for basic metabolic panel

## 2018-11-05 LAB — PSA, TOTAL AND FREE
PSA, Free Pct: 38.3 %
PSA, Free: 0.46 ng/mL
Prostate Specific Ag, Serum: 1.2 ng/mL (ref 0.0–4.0)

## 2018-11-05 LAB — CBC WITH DIFFERENTIAL/PLATELET
Basophils Absolute: 0.1 10*3/uL (ref 0.0–0.2)
Basos: 1 %
EOS (ABSOLUTE): 0.2 10*3/uL (ref 0.0–0.4)
Eos: 1 %
Hematocrit: 50.3 % (ref 37.5–51.0)
Hemoglobin: 17.5 g/dL (ref 13.0–17.7)
Immature Grans (Abs): 0 10*3/uL (ref 0.0–0.1)
Immature Granulocytes: 0 %
Lymphocytes Absolute: 3.9 10*3/uL — ABNORMAL HIGH (ref 0.7–3.1)
Lymphs: 35 %
MCH: 31.3 pg (ref 26.6–33.0)
MCHC: 34.8 g/dL (ref 31.5–35.7)
MCV: 90 fL (ref 79–97)
Monocytes Absolute: 0.9 10*3/uL (ref 0.1–0.9)
Monocytes: 8 %
Neutrophils Absolute: 6.2 10*3/uL (ref 1.4–7.0)
Neutrophils: 55 %
Platelets: 234 10*3/uL (ref 150–450)
RBC: 5.59 x10E6/uL (ref 4.14–5.80)
RDW: 12.5 % (ref 11.6–15.4)
WBC: 11.3 10*3/uL — ABNORMAL HIGH (ref 3.4–10.8)

## 2018-11-05 LAB — CMP14+EGFR
ALT: 13 IU/L (ref 0–44)
AST: 18 IU/L (ref 0–40)
Albumin/Globulin Ratio: 1.5 (ref 1.2–2.2)
Albumin: 4.4 g/dL (ref 3.8–4.8)
Alkaline Phosphatase: 134 IU/L — ABNORMAL HIGH (ref 39–117)
BUN/Creatinine Ratio: 16 (ref 10–24)
BUN: 19 mg/dL (ref 8–27)
Bilirubin Total: 0.2 mg/dL (ref 0.0–1.2)
CO2: 23 mmol/L (ref 20–29)
Calcium: 10.1 mg/dL (ref 8.6–10.2)
Chloride: 102 mmol/L (ref 96–106)
Creatinine, Ser: 1.21 mg/dL (ref 0.76–1.27)
GFR calc Af Amer: 71 mL/min/{1.73_m2} (ref >59)
GFR calc non Af Amer: 62 mL/min/{1.73_m2} (ref >59)
Globulin, Total: 3 g/dL (ref 1.5–4.5)
Glucose: 93 mg/dL (ref 65–99)
Potassium: 4.9 mmol/L (ref 3.5–5.2)
Sodium: 143 mmol/L (ref 134–144)
Total Protein: 7.4 g/dL (ref 6.0–8.5)

## 2018-11-05 LAB — LIPID PANEL
Chol/HDL Ratio: 4.3 ratio (ref 0.0–5.0)
Cholesterol, Total: 214 mg/dL — ABNORMAL HIGH (ref 100–199)
HDL: 50 mg/dL (ref >39)
LDL Calculated: 139 mg/dL — ABNORMAL HIGH (ref 0–99)
Triglycerides: 124 mg/dL (ref 0–149)
VLDL Cholesterol Cal: 25 mg/dL (ref 5–40)

## 2018-11-05 LAB — TSH: TSH: 7.2 u[IU]/mL — ABNORMAL HIGH (ref 0.450–4.500)

## 2018-11-08 MED ORDER — LEVOTHYROXINE SODIUM 50 MCG PO TABS: 50.0000 ug | ORAL_TABLET | Freq: Every day | ORAL | 1 refills | Status: DC

## 2018-11-08 NOTE — Addendum Note (Signed)
Addended by: Karle Plumber on: 11/08/2018 04:19 PM   Modules accepted: Orders

## 2018-11-10 ENCOUNTER — Telehealth: Payer: Self-pay | Admitting: Family Medicine

## 2018-11-10 MED ORDER — OMEPRAZOLE 20 MG PO CPDR: 20.0000 mg | DELAYED_RELEASE_CAPSULE | Freq: Every day | ORAL | 3 refills | Status: DC

## 2018-11-10 NOTE — Telephone Encounter (Signed)
I forgot the other day, I called it in now for him.

## 2018-11-10 NOTE — Telephone Encounter (Signed)
Aware. 

## 2018-11-10 NOTE — Telephone Encounter (Signed)
-----   Message from Karle Plumber, [place] sent at 11/08/2018  4:20 PM EDT ----- Patient aware of lab results and medication sent.  Patient states that Dr. Keturah Barre was supposed to call him in Prilosec that he has been getting otc. Please advise

## 2019-01-17 ENCOUNTER — Other Ambulatory Visit: Payer: Self-pay

## 2019-01-17 ENCOUNTER — Other Ambulatory Visit: Payer: PPO

## 2019-01-17 DIAGNOSIS — R7989 Other specified abnormal findings of blood chemistry: Secondary | ICD-10-CM

## 2019-01-18 LAB — TSH: TSH: 1.47 u[IU]/mL (ref 0.450–4.500)

## 2019-02-01 ENCOUNTER — Telehealth: Payer: Self-pay | Admitting: Family Medicine

## 2019-02-01 NOTE — Telephone Encounter (Signed)
Aware. Continue same dose of thyroid medication.

## 2019-02-07 ENCOUNTER — Encounter: Payer: PPO | Admitting: Gastroenterology

## 2019-02-25 ENCOUNTER — Other Ambulatory Visit: Payer: Self-pay

## 2019-02-25 ENCOUNTER — Ambulatory Visit (INDEPENDENT_AMBULATORY_CARE_PROVIDER_SITE_OTHER): Payer: Self-pay | Admitting: Nurse Practitioner

## 2019-02-25 ENCOUNTER — Encounter: Payer: Self-pay | Admitting: Nurse Practitioner

## 2019-02-25 VITALS — BP 139/71 | HR 88 | Ht 70.0 in | Wt 145.0 lb

## 2019-02-25 DIAGNOSIS — Z024 Encounter for examination for driving license: Secondary | ICD-10-CM

## 2019-02-25 LAB — URINALYSIS
Bilirubin, UA: NEGATIVE
Glucose, UA: NEGATIVE
Ketones, UA: NEGATIVE
Leukocytes,UA: NEGATIVE
Nitrite, UA: NEGATIVE
Protein,UA: NEGATIVE
Specific Gravity, UA: 1.01 (ref 1.005–1.030)
Urobilinogen, Ur: 0.2 mg/dL (ref 0.2–1.0)
pH, UA: 6.5 (ref 5.0–7.5)

## 2019-02-25 NOTE — Progress Notes (Signed)
Patient ID: [name] [name], male   DOB: Mar 20, 1951, 68 y.o.   MRN: [id number]  [employer]- see scanned in documentation

## 2019-04-25 ENCOUNTER — Encounter: Payer: Self-pay | Admitting: Family Medicine

## 2019-04-25 ENCOUNTER — Ambulatory Visit (INDEPENDENT_AMBULATORY_CARE_PROVIDER_SITE_OTHER): Payer: PPO | Admitting: Family Medicine

## 2019-04-25 DIAGNOSIS — J209 Acute bronchitis, unspecified: Secondary | ICD-10-CM | POA: Insufficient documentation

## 2019-04-25 DIAGNOSIS — I10 Essential (primary) hypertension: Secondary | ICD-10-CM

## 2019-04-25 DIAGNOSIS — J441 Chronic obstructive pulmonary disease with (acute) exacerbation: Secondary | ICD-10-CM

## 2019-04-25 DIAGNOSIS — K219 Gastro-esophageal reflux disease without esophagitis: Secondary | ICD-10-CM | POA: Diagnosis not present

## 2019-04-25 DIAGNOSIS — F17200 Nicotine dependence, unspecified, uncomplicated: Secondary | ICD-10-CM | POA: Diagnosis not present

## 2019-04-25 DIAGNOSIS — E782 Mixed hyperlipidemia: Secondary | ICD-10-CM

## 2019-04-25 DIAGNOSIS — J440 Chronic obstructive pulmonary disease with acute lower respiratory infection: Secondary | ICD-10-CM

## 2019-04-25 DIAGNOSIS — J449 Chronic obstructive pulmonary disease, unspecified: Secondary | ICD-10-CM | POA: Insufficient documentation

## 2019-04-25 DIAGNOSIS — E0590 Thyrotoxicosis, unspecified without thyrotoxic crisis or storm: Secondary | ICD-10-CM

## 2019-04-25 MED ORDER — SPIRIVA HANDIHALER 18 MCG IN CAPS: 18.0000 ug | ORAL_CAPSULE | Freq: Every day | RESPIRATORY_TRACT | 12 refills | Status: DC

## 2019-04-25 MED ORDER — ALBUTEROL SULFATE HFA 108 (90 BASE) MCG/ACT IN AERS: 2.0000 | INHALATION_SPRAY | RESPIRATORY_TRACT | 3 refills | Status: DC | PRN

## 2019-04-25 NOTE — Progress Notes (Signed)
Virtual Visit via telephone Note  I connected with [name] [name] on 04/25/19 at 1325 by telephone and verified that I am speaking with the correct person using two identifiers. [name] [name] is currently located at home and no other people are currently with her during visit. The provider, Fransisca Kaufmann Janeli Lewison, MD is located in their office at time of visit.  Call ended at 1338  I discussed the limitations, risks, security and privacy concerns of performing an evaluation and management service by telephone and the availability of in person appointments. I also discussed with the patient that there may be a patient responsible charge related to this service. The patient expressed understanding and agreed to proceed.   History and Present Illness: Hypothyroidism recheck Patient is coming in for thyroid recheck today as well. They deny any issues with hair changes or heat or cold problems or diarrhea or constipation. They deny any chest pain or palpitations. They are currently on levothyroxine 18micrograms. Patient has no appetite and lost weight.   COPD Patient is coming in for COPD recheck today.  He is currently on albuterol.  He has a mild chronic cough but denies any major coughing spells or wheezing spells.  He has 7nighttime symptoms per week and 7daytime symptoms per week currently.  Heat and mornings are the worst for coughing and wheezing.  Patient has no appetite and is losing weight and will order chest x-ray because atenolol  Hyperlipidemia Patient is coming in for recheck of his hyperlipidemia. The patient is currently taking no medication currently. They deny any issues with myalgias or history of liver damage from it. They deny any focal numbness or weakness or chest pain.   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 Patient denies any lightheadedness or dizziness. Patient denies  headaches, blurred vision, chest pains, shortness of breath, or weakness. Denies any side effects from medication and is content with current medication.    No diagnosis found.  Outpatient Encounter Medications as of 04/25/2019  Medication Sig   albuterol (PROVENTIL HFA;VENTOLIN HFA) 108 (90 Base) MCG/ACT inhaler Inhale 2 puffs into the lungs every 4 (four) hours as needed for wheezing or shortness of breath.   aspirin EC 81 MG tablet Take 81 mg by mouth daily.   cephALEXin (KEFLEX) 500 MG capsule Take 1 capsule (500 mg total) by mouth 4 (four) times daily.   Cholecalciferol (VITAMIN D-3 PO) Take 1 tablet by mouth daily.    Flaxseed, Linseed, (FLAXSEED OIL) 1000 MG CAPS Take 1,000 mg by mouth daily.    fluticasone (FLONASE) 50 MCG/ACT nasal spray Place 2 sprays into both nostrils daily. (Patient taking differently: Place 2 sprays into both nostrils daily as needed (for allergies.). )   levothyroxine (SYNTHROID) 50 MCG tablet Take 1 tablet (50 mcg total) by mouth daily.   lisinopril (ZESTRIL) 20 MG tablet Take 1 tablet (20 mg total) by mouth daily.   Multiple Vitamin (MULTIVITAMIN WITH MINERALS) TABS tablet Take 1 tablet by mouth daily. Centrum Silver   naproxen sodium (ALEVE) 220 MG tablet Take 440 mg by mouth 2 (two) times daily as needed (for pain.).   nitroGLYCERIN (NITROSTAT) 0.4 MG SL tablet Place 1 tablet (0.4 mg total) under the tongue every 5 (five) minutes as needed for chest pain.   omeprazole (PRILOSEC) 20 MG capsule Take 1 capsule (20 mg total) by mouth daily.   No facility-administered encounter medications  on file as of 04/25/2019.     Review of Systems  Constitutional: Negative for chills and fever.  Eyes: Negative for visual disturbance.  Respiratory: Negative for shortness of breath and wheezing.   Cardiovascular: Negative for chest pain and leg swelling.  Musculoskeletal: Negative for back pain and gait problem.  Skin: Negative for rash.  Neurological:  Negative for dizziness, weakness and light-headedness.  All other systems reviewed and are negative.   Observations/Objective: Patient sounds comfortable and in no acute distress  Assessment and Plan: Problem List Items Addressed This Visit      Cardiovascular and Mediastinum   Essential hypertension - Primary     Respiratory   Acute bronchitis with COPD ([place])   Relevant Medications   albuterol (VENTOLIN HFA) 108 (90 Base) MCG/ACT inhaler   tiotropium (SPIRIVA HANDIHALER) 18 MCG inhalation capsule   Other Relevant Orders   DG Chest 2 View     Digestive   GERD (gastroesophageal reflux disease)     Endocrine   Hyperthyroidism     Other   HLD (hyperlipidemia)   TOBACCO ABUSE    Other Visit Diagnoses    COPD exacerbation ([place])       Relevant Medications   albuterol (VENTOLIN HFA) 108 (90 Base) MCG/ACT inhaler   tiotropium (SPIRIVA HANDIHALER) 18 MCG inhalation capsule   Other Relevant Orders   DG Chest 2 View       Follow Up Instructions: Follow-up in 1 month    I discussed the assessment and treatment plan with the patient. The patient was provided an opportunity to ask questions and all were answered. The patient agreed with the plan and demonstrated an understanding of the instructions.   The patient was advised to call back or seek an in-person evaluation if the symptoms worsen or if the condition fails to improve as anticipated.  The above assessment and management plan was discussed with the patient. The patient verbalized understanding of and has agreed to the management plan. Patient is aware to call the clinic if symptoms persist or worsen. Patient is aware when to return to the clinic for a follow-up visit. Patient educated on when it is appropriate to go to the emergency department.    I provided 13 minutes of non-face-to-face time during this encounter.    Worthy Rancher, MD

## 2019-04-29 ENCOUNTER — Telehealth: Payer: Self-pay | Admitting: Family Medicine

## 2019-04-29 ENCOUNTER — Ambulatory Visit (HOSPITAL_COMMUNITY)
Admission: RE | Admit: 2019-04-29 | Discharge: 2019-04-29 | Disposition: A | Discharge status: Home / Self Care | Payer: PPO | Source: Ambulatory Visit | Attending: Family Medicine | Admitting: Family Medicine

## 2019-04-29 ENCOUNTER — Other Ambulatory Visit: Payer: Self-pay

## 2019-04-29 DIAGNOSIS — R0602 Shortness of breath: Secondary | ICD-10-CM | POA: Diagnosis not present

## 2019-04-29 DIAGNOSIS — J440 Chronic obstructive pulmonary disease with acute lower respiratory infection: Secondary | ICD-10-CM | POA: Insufficient documentation

## 2019-04-29 DIAGNOSIS — J441 Chronic obstructive pulmonary disease with (acute) exacerbation: Secondary | ICD-10-CM | POA: Diagnosis not present

## 2019-04-29 DIAGNOSIS — J209 Acute bronchitis, unspecified: Secondary | ICD-10-CM | POA: Insufficient documentation

## 2019-04-29 DIAGNOSIS — R05 Cough: Secondary | ICD-10-CM | POA: Diagnosis not present

## 2019-05-02 NOTE — Telephone Encounter (Signed)
Refer to result not.

## 2019-07-02 ENCOUNTER — Other Ambulatory Visit: Payer: Self-pay | Admitting: Family Medicine

## 2019-07-08 ENCOUNTER — Ambulatory Visit (INDEPENDENT_AMBULATORY_CARE_PROVIDER_SITE_OTHER): Payer: PPO | Admitting: *Deleted

## 2019-07-08 DIAGNOSIS — Z0000 Encounter for general adult medical examination without abnormal findings: Secondary | ICD-10-CM

## 2019-07-08 NOTE — Patient Instructions (Signed)

## 2019-07-08 NOTE — Progress Notes (Signed)
MEDICARE ANNUAL WELLNESS VISIT  07/08/2019  Telephone Visit Disclaimer This Medicare AWV was conducted by telephone due to national recommendations for restrictions regarding the COVID-19 Pandemic (e.g. social distancing).  I verified, using two identifiers, that I am speaking with [name] [name] or their authorized healthcare agent. I discussed the limitations, risks, security, and privacy concerns of performing an evaluation and management service by telephone and the potential availability of an in-person appointment in the future. The patient expressed understanding and agreed to proceed.   Subjective:  MICHEAUX [name] is a 69 y.o. male patient of Dettinger, Fransisca Kaufmann, MD who had a Medicare Annual Wellness Visit today via telephone. 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 he has 3 living children and 1 son who died at birth. he reports that he is socially active and does interact with friends/family regularly. he is moderately physically active and enjoys fishing.  Patient Care Team: Dettinger, Fransisca Kaufmann, MD as PCP - General (Family Medicine) Minus Breeding, MD as Consulting Physician (Cardiology)  Advanced Directives 07/08/2019 07/05/2018 08/04/2017 06/29/2017 01/19/2017 01/05/2017 01/07/2016  Does Patient Have a Medical Advance Directive? No No No No No No No  Would patient like information on creating a medical advance directive? No - Patient declined Yes (MAU/Ambulatory/Procedural Areas - Information given) No - Patient declined No - Patient declined - - -  Pre-existing out of facility DNR order (yellow form or pink MOST form) - - - - - - -    Hospital Utilization Over the Past 12 Months: # of hospitalizations or ER visits: 0 # of surgeries: 0  Review of Systems    Patient reports that his overall health is unchanged compared to last year.  History obtained from chart review  Patient Reported Readings (BP, Pulse, CBG, Weight,  etc) none  Pain Assessment Pain : No/denies pain     Current Medications & Allergies (verified) Allergies as of 07/08/2019      Reactions   Crestor [rosuvastatin Calcium] Other (See Comments)   Lethargy and muscle aches   Flomax [tamsulosin Hcl] Other (See Comments)   Made patient hypotensive, clammy, sweaty, and feel like he was going to pass out (EMS HAD TO BE CALLED)   Lipitor [atorvastatin Calcium] Other (See Comments)   Body cramps, lethargy and muscle aches      Medication List       Accurate as of July 08, 2019  9:02 AM. If you have any questions, ask your nurse or doctor.        albuterol 108 (90 Base) MCG/ACT inhaler Commonly known as: VENTOLIN HFA Inhale 2 puffs into the lungs every 4 (four) hours as needed for wheezing or shortness of breath.   aspirin EC 81 MG tablet Take 81 mg by mouth daily.   Flaxseed Oil 1000 MG Caps Take 1,000 mg by mouth daily.   fluticasone 50 MCG/ACT nasal spray Commonly known as: FLONASE Place 2 sprays into both nostrils daily. What changed:   when to take this  reasons to take this   levothyroxine 50 MCG tablet Commonly known as: SYNTHROID TAKE 1 TABLET DAILY   lisinopril 20 MG tablet Commonly known as: ZESTRIL Take 1 tablet (20 mg total) by mouth daily.   multivitamin with minerals Tabs tablet Take 1 tablet by mouth daily. Centrum Silver   naproxen sodium 220 MG tablet Commonly known as: ALEVE Take 440 mg by mouth 2 (two) times daily as needed (for pain.).  nitroGLYCERIN 0.4 MG SL tablet Commonly known as: NITROSTAT Place 1 tablet (0.4 mg total) under the tongue every 5 (five) minutes as needed for chest pain.   omeprazole 20 MG capsule Commonly known as: PRILOSEC Take 1 capsule (20 mg total) by mouth daily.   Spiriva HandiHaler 18 MCG inhalation capsule Generic drug: tiotropium Place 1 capsule (18 mcg total) into inhaler and inhale daily.   VITAMIN D-3 PO Take 1 tablet by mouth daily.        History (reviewed): Past Medical History:  Diagnosis Date  . Allergy   . Anxiety    Patient denies  . Arthritis   . CAD (coronary artery disease)    The EF was 55% with inferior hypokinesis. Cath 09  . Carotid stenosis   . Coronary artery disease    left main had luminal irregularities, the LAD had 30% stenosis, the first diagonal had moderate-sized with ostial 35% stenosis, the circumflex in the AV groove had ostial 25% stenosis, the first obtuse marginal had long proximal 30% stenosis, the mid obtuse marginal was normal, the right coronary artery was dominant and occluded proximally.  There was excellent left to right collateral filling.     . Depression    Patient denies  . Diverticulitis   . Dyslipidemia   . Essential hypertension 08/24/2017  . GERD (gastroesophageal reflux disease)   . Lung nodule    benign  . Osteoporosis   . Tobacco abuse    Past Surgical History:  Procedure Laterality Date  . CAROTID ENDARTERECTOMY Right   . COLONOSCOPY    . LEFT HEART CATH AND CORONARY ANGIOGRAPHY N/A 08/04/2017   Procedure: LEFT HEART CATH AND CORONARY ANGIOGRAPHY;  Surgeon: Burnell Blanks, MD;  Location: [facility];  Service: Cardiovascular;  Laterality: N/A;  . left inguinal herniorrhaphy    . THROAT SURGERY     Family History  Problem Relation Age of Onset  . Lung disease Father   . Lupus Mother   . Lupus Brother   . Alcohol abuse Brother   . Lung disease Brother   . Cancer Brother        colon  . Lung disease Brother   . Healthy Daughter   . Healthy Son   . Healthy Son   . Healthy Son   . Esophageal cancer Neg Hx   . Rectal cancer Neg Hx   . Stomach cancer Neg Hx   . Pancreatic cancer Neg Hx    Social History   Socioeconomic History  . Marital status: Married    Spouse name: [name]  . Number of children: 4  . Years of education: high school diploma  . Highest education level: 12th grade  Occupational History  . Occupation: truck Consulting civil engineer: [employer]  Tobacco Use  . Smoking status: Current Every Day Smoker    Packs/day: 1.00    Years: 40.00    Pack years: 40.00    Types: Cigarettes  . Smokeless tobacco: Never Used  Substance and Sexual Activity  . Alcohol use: Yes    Alcohol/week: 1.0 standard drinks    Types: 1 Shots of liquor per week  . Drug use: No  . Sexual activity: Yes    Birth control/protection: None  Other Topics Concern  . Not on file  Social History Narrative   Lives with wife in a split level home.    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   Has 1 daughter,  3 sons, and many grandchildren.   Social Determinants of Health   Financial Resource Strain: Low Risk   . Difficulty of Paying Living Expenses: Not hard at all  Food Insecurity: No Food Insecurity  . Worried About Charity fundraiser in the Last Year: Never true  . Ran Out of Food in the Last Year: Never true  Transportation Needs: No Transportation Needs  . Lack of Transportation (Medical): No  . Lack of Transportation (Non-Medical): No  Physical Activity: Inactive  . Days of Exercise per Week: 0 days  . Minutes of Exercise per Session: 0 min  Stress: No Stress Concern Present  . Feeling of Stress : Not at all  Social Connections: Slightly Isolated  . Frequency of Communication with Friends and Family: More than three times a week  . Frequency of Social Gatherings with Friends and Family: More than three times a week  . Attends Religious Services: More than 4 times per year  . Active Member of Clubs or Organizations: No  . Attends Archivist Meetings: Never  . Marital Status: Married    Activities of Daily Living In your present state of health, do you have any difficulty performing the following activities: 07/08/2019  Hearing? N  Vision? N  Comment wears glasses-gets yearly eye exam  Difficulty concentrating or making decisions? N  Walking or climbing stairs? N  Dressing or bathing? N  Doing  errands, shopping? N  Preparing Food and eating ? N  Using the Toilet? N  In the past six months, have you accidently leaked urine? N  Do you have problems with loss of bowel control? N  Managing your Medications? N  Managing your Finances? N  Housekeeping or managing your Housekeeping? N  Some recent data might be hidden    Patient Education/ Literacy How often do you need to have someone help you when you read instructions, pamphlets, or other written materials from your doctor or pharmacy?: 1 - Never What is the last grade level you completed in school?: 12th grade  Exercise Current Exercise Habits: The patient has a physically strenuous job, but has no regular exercise apart from work., Exercise limited by: respiratory conditions(s);cardiac condition(s)  Diet Patient reports consuming 2 meals a day and 3 snack(s) a day Patient reports that his primary diet is: Regular Patient reports that she does have regular access to food.   Depression Screen PHQ 2/9 Scores 07/08/2019 11/04/2018 08/30/2018 07/05/2018 06/28/2018 06/07/2018 10/26/2017  PHQ - 2 Score 0 0 0 0 0 0 0     Fall Risk Fall Risk  07/08/2019 08/30/2018 07/05/2018 06/28/2018 06/07/2018  Falls in the past year? 0 1 0 0 0  Number falls in past yr: - 0 - - -     Objective:  [name] [name] seemed alert and oriented and he participated appropriately during our telephone visit.  Blood Pressure Weight BMI  BP Readings from Last 3 Encounters:  02/25/19 139/71  11/04/18 (!) 167/86  09/13/18 (!) 164/77   Wt Readings from Last 3 Encounters:  02/25/19 145 lb (65.8 kg)  11/04/18 149 lb 9.6 oz (67.9 kg)  09/13/18 148 lb 6.4 oz (67.3 kg)   BMI Readings from Last 1 Encounters:  02/25/19 20.81 kg/m    *Unable to obtain current vital signs, weight, and BMI due to telephone visit type  Hearing/Vision  . Shenan did not seem to have difficulty with hearing/understanding during the telephone conversation . Reports that he has had a  formal eye exam by an eye care professional within the past year . Reports that he has not had a formal hearing evaluation within the past year *Unable to fully assess hearing and vision during telephone visit type  Cognitive Function: 6CIT Screen 07/08/2019  What Year? 0 points  What month? 0 points  What time? 0 points  Count back from 20 0 points  Months in reverse 2 points  Repeat phrase 0 points  Total Score 2   (Normal:0-7, Significant for Dysfunction: >8)  Normal Cognitive Function Screening: Yes   Immunization & Health Maintenance Record Immunization History  Administered Date(s) Administered  . Pneumococcal Conjugate-13 01/07/2016  . Pneumococcal Polysaccharide-23 06/29/2017    Health Maintenance  Topic Date Due  . TETANUS/TDAP  12/14/1969  . COLONOSCOPY  01/20/2027  . Hepatitis C Screening  Completed  . PNA vac Low Risk Adult  Completed  . INFLUENZA VACCINE  Discontinued       Assessment  This is a routine wellness examination for DECORIUS OUTWATER.  Health Maintenance: Due or Overdue Health Maintenance Due  Topic Date Due  . Samul Dada  12/14/1969    [name] [name] does not need a referral for Community Assistance: Care Management:   no Social Work:    no Prescription Assistance:  no Nutrition/Diabetes Education:  no   Plan:  Personalized Goals Goals Addressed            This Visit's Progress   . DIET - INCREASE WATER INTAKE       Try to drink 6-8 glasses of water daily      Personalized Health Maintenance & Screening Recommendations  Influenza vaccine Td vaccine Shingles vaccine  Lung Cancer Screening Recommended: yes-declines at this time (Low Dose CT Chest recommended if Age 24-80 years, 30 pack-year currently smoking OR have quit w/in past 15 years) Hepatitis C Screening recommended: no HIV Screening recommended: no  Advanced Directives: Written information was not prepared per patient's request.  Referrals & Orders No  orders of the defined types were placed in this encounter.   Follow-up Plan . Follow-up with Dettinger, Fransisca Kaufmann, MD as planned . Consider Flu, TDAP and Shingles vaccines at your next visit with your PCP   I have personally reviewed and noted the following in the patient's chart:   . Medical and social history . Use of alcohol, tobacco or illicit drugs  . Current medications and supplements . Functional ability and status . Nutritional status . Physical activity . Advanced directives . List of other physicians . Hospitalizations, surgeries, and ER visits in previous 12 months . Vitals . Screenings to include cognitive, depression, and falls . Referrals and appointments  In addition, I have reviewed and discussed with [name] [name] certain preventive protocols, quality metrics, and best practice recommendations. A written personalized care plan for preventive services as well as general preventive health recommendations is available and can be mailed to the patient at his request.      Milas Hock, LPN  [phone]

## 2019-08-17 ENCOUNTER — Telehealth: Payer: Self-pay | Admitting: Family Medicine

## 2019-08-17 NOTE — Chronic Care Management (AMB) (Signed)
  Chronic Care Management   Outreach Note  08/17/2019 Name: [name] [name] MRN: [id number] DOB: 11-03-1950  [name] [name] is a 69 y.o. year old male who is a primary care patient of Dettinger, Fransisca Kaufmann, MD. I reached out to [name] [name] by phone today in response to a referral sent by [name]'s health plan.     An unsuccessful telephone outreach was attempted today. The patient was referred to the case management team for assistance with care management and care coordination.   Follow Up Plan: A HIPPA compliant phone message was left for the patient providing contact information and requesting a return call.  The care management team will reach out to the patient again over the next 7 days.  If patient returns call to provider office, please advise to call [facility] [place], [place], [place], [place] [address] Direct Dial: [phone] [email or web address] Website: [email or web address]

## 2019-08-22 NOTE — Chronic Care Management (AMB) (Signed)
  Chronic Care Management   Note  08/22/2019 Name: JOEY LIERMAN MRN: [id number] DOB: 1951/04/26  JOSIMAR CORNING is a 69 y.o. year old male who is a primary care patient of Dettinger, Fransisca Kaufmann, MD. I reached out to [name] by phone today in response to a referral sent by Mr. Laurent Cargile Petties's health plan.     Mr. Buntyn was given information about Chronic Care Management services today including:  1. CCM service includes personalized support from designated clinical staff supervised by his physician, including individualized plan of care and coordination with other care providers 2. 24/7 contact phone numbers for assistance for urgent and routine care needs. 3. Service will only be billed when office clinical staff spend 20 minutes or more in a month to coordinate care. 4. Only one practitioner may furnish and bill the service in a calendar month. 5. The patient may stop CCM services at any time (effective at the end of the month) by phone call to the office staff. 6. The patient will be responsible for cost sharing (co-pay) of up to 20% of the service fee (after annual deductible is met).  Patient agreed to services and verbal consent obtained.   Follow up plan: Telephone appointment with care management team member scheduled for:10/10/2019  Noreene Larsson, [place], [place],  [address] Direct Dial: [phone] [email or web address] Website: [email or web address]

## 2019-09-11 ENCOUNTER — Other Ambulatory Visit: Payer: Self-pay

## 2019-09-11 ENCOUNTER — Encounter (HOSPITAL_COMMUNITY): Payer: Self-pay

## 2019-09-11 ENCOUNTER — Emergency Department (HOSPITAL_COMMUNITY): Payer: PPO

## 2019-09-11 ENCOUNTER — Emergency Department (HOSPITAL_COMMUNITY)
Admission: EM | Admit: 2019-09-11 | Discharge: 2019-09-11 | Disposition: A | Discharge status: Home / Self Care | Payer: PPO | Attending: Emergency Medicine | Admitting: Emergency Medicine

## 2019-09-11 DIAGNOSIS — I2510 Atherosclerotic heart disease of native coronary artery without angina pectoris: Secondary | ICD-10-CM | POA: Diagnosis not present

## 2019-09-11 DIAGNOSIS — Z20822 Contact with and (suspected) exposure to covid-19: Secondary | ICD-10-CM | POA: Insufficient documentation

## 2019-09-11 DIAGNOSIS — R519 Headache, unspecified: Secondary | ICD-10-CM | POA: Diagnosis not present

## 2019-09-11 DIAGNOSIS — I6529 Occlusion and stenosis of unspecified carotid artery: Secondary | ICD-10-CM | POA: Diagnosis not present

## 2019-09-11 DIAGNOSIS — F17210 Nicotine dependence, cigarettes, uncomplicated: Secondary | ICD-10-CM | POA: Diagnosis not present

## 2019-09-11 DIAGNOSIS — Z79899 Other long term (current) drug therapy: Secondary | ICD-10-CM | POA: Insufficient documentation

## 2019-09-11 DIAGNOSIS — I10 Essential (primary) hypertension: Secondary | ICD-10-CM | POA: Diagnosis not present

## 2019-09-11 LAB — CBC WITH DIFFERENTIAL/PLATELET
Abs Immature Granulocytes: 0.02 10*3/uL (ref 0.00–0.07)
Basophils Absolute: 0 10*3/uL (ref 0.0–0.1)
Basophils Relative: 0 %
Eosinophils Absolute: 0.1 10*3/uL (ref 0.0–0.5)
Eosinophils Relative: 1 %
HCT: 48.8 % (ref 39.0–52.0)
Hemoglobin: 16.4 g/dL (ref 13.0–17.0)
Immature Granulocytes: 0 %
Lymphocytes Relative: 34 %
Lymphs Abs: 3.5 10*3/uL (ref 0.7–4.0)
MCH: 31.8 pg (ref 26.0–34.0)
MCHC: 33.6 g/dL (ref 30.0–36.0)
MCV: 94.8 fL (ref 80.0–100.0)
Monocytes Absolute: 1 10*3/uL (ref 0.1–1.0)
Monocytes Relative: 9 %
Neutro Abs: 5.7 10*3/uL (ref 1.7–7.7)
Neutrophils Relative %: 56 %
Platelets: 197 10*3/uL (ref 150–400)
RBC: 5.15 MIL/uL (ref 4.22–5.81)
RDW: 12.6 % (ref 11.5–15.5)
WBC: 10.3 10*3/uL (ref 4.0–10.5)
nRBC: 0 % (ref 0.0–0.2)

## 2019-09-11 LAB — BASIC METABOLIC PANEL
Anion gap: 10 (ref 5–15)
BUN: 16 mg/dL (ref 8–23)
CO2: 27 mmol/L (ref 22–32)
Calcium: 9.3 mg/dL (ref 8.9–10.3)
Chloride: 100 mmol/L (ref 98–111)
Creatinine, Ser: 1.13 mg/dL (ref 0.61–1.24)
GFR calc Af Amer: >60 mL/min (ref >60)
GFR calc non Af Amer: >60 mL/min (ref >60)
Glucose, Bld: 103 mg/dL — ABNORMAL HIGH (ref 70–99)
Potassium: 4.3 mmol/L (ref 3.5–5.1)
Sodium: 137 mmol/L (ref 135–145)

## 2019-09-11 LAB — POC SARS CORONAVIRUS 2 AG -  ED: SARS Coronavirus 2 Ag: NEGATIVE

## 2019-09-11 MED ORDER — KETOROLAC TROMETHAMINE 15 MG/ML IJ SOLN
15.0000 mg | Freq: Once | INTRAMUSCULAR | Status: AC
  Administered 2019-09-11: 15 mg via INTRAVENOUS
  Filled 2019-09-11: qty 1

## 2019-09-11 MED ORDER — DIPHENHYDRAMINE HCL 50 MG/ML IJ SOLN
12.5000 mg | Freq: Once | INTRAMUSCULAR | Status: AC
  Administered 2019-09-11: 12.5 mg via INTRAVENOUS
  Filled 2019-09-11: qty 1

## 2019-09-11 MED ORDER — METOCLOPRAMIDE HCL 5 MG/ML IJ SOLN
10.0000 mg | Freq: Once | INTRAMUSCULAR | Status: AC
  Administered 2019-09-11: 10:00:00 10 mg via INTRAVENOUS
  Filled 2019-09-11: qty 2

## 2019-09-11 NOTE — Discharge Instructions (Signed)
Be sure to follow-up with your primary doctor for recheck.  Your Covid test is still pending.  You may review your results on MyChart usually within 24 hours.  In the meantime, you will need to quarantine at home.  Return to the emergency department for any worsening symptoms.

## 2019-09-11 NOTE — ED Triage Notes (Signed)
Pt reports HA, fever and chill since Friday. Afebrile at this time

## 2019-09-12 ENCOUNTER — Encounter: Payer: Self-pay | Admitting: Family Medicine

## 2019-09-12 ENCOUNTER — Ambulatory Visit (INDEPENDENT_AMBULATORY_CARE_PROVIDER_SITE_OTHER): Payer: PPO | Admitting: Family Medicine

## 2019-09-12 DIAGNOSIS — F17200 Nicotine dependence, unspecified, uncomplicated: Secondary | ICD-10-CM

## 2019-09-12 DIAGNOSIS — G4452 New daily persistent headache (NDPH): Secondary | ICD-10-CM | POA: Diagnosis not present

## 2019-09-12 DIAGNOSIS — I25119 Atherosclerotic heart disease of native coronary artery with unspecified angina pectoris: Secondary | ICD-10-CM

## 2019-09-12 LAB — SARS CORONAVIRUS 2 (TAT 6-24 HRS): SARS Coronavirus 2: NEGATIVE

## 2019-09-12 MED ORDER — TRAMADOL HCL 50 MG PO TABS: 50.0000 mg | ORAL_TABLET | Freq: Four times a day (QID) | ORAL | 0 refills | Status: AC | PRN

## 2019-09-12 MED ORDER — SUMATRIPTAN SUCCINATE 50 MG PO TABS: 50.0000 mg | ORAL_TABLET | ORAL | 0 refills | Status: DC | PRN

## 2019-09-12 NOTE — ED Provider Notes (Signed)
Medical screening examination/treatment/procedure(s) were conducted as a shared visit with non-physician practitioner(s) and myself.  I personally evaluated the patient during the encounter.    Agreed with PA provider note.  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  Feels fatigued and tired.  Has had headache for nearly 3 days now, on top and front of his head, throbbing.  Does not often get headaches or migraines.  No photophobia, neck stiffness.   On exam vitals wnl, afebrile. Patient is comfortable in room.  No photophobia, meningusmus or rash.  Breath sounds clear.  No swelling of the lower extremities.  This presentation is highly consistent with a viral syndrome.  Headache along with myalgias, fevers and chills.  POC covid was negative, but we'll send out another test.  This is less likely meningitis after 3 days of symptoms.  He is afebrile and clinically well appearing. Less likely SAH or aneurysm with his report of diffuse myalgias, fevers and chills. Less likely venous thrombosis with no reported clotting hx, and with his other systemic symptoms.   Union ordered with no evidence of mass lesion.  Labs unremarakble. Pain improved after migraine cocktail from 10/10 to 5/10.  He felt better and was asking to leave.  Felt this was reasonable.    Wyvonnia Dusky, MD 09/12/19 1331

## 2019-09-12 NOTE — Progress Notes (Signed)
Subjective:    Patient ID: [name] [name], male    DOB: [birth year], 69 y.o.   MRN: [id number]   HPI: [name] [name] is a 69 y.o. male presenting for bad HA started 3 days ago. ED gave him 3 shots yesterday, benadryl, toradol, reglan.. Ache in legs, feet, etc. No energy. Top of head going to come off feeling. Constant. 10/10 yesterday 7-8/10.  Currently.  ER review shows that he had 2 coronavirus tests that were negative.  His CT of the head was unremarkable with regard to bleed stroke aneurysm.  His lab showed an normal BMP and CBC.  Patient is not prone to headaches apparently he denies any coronary artery disease.  He has had carotid artery stenosis.  The headache is not causing mental status changes with regard to cognition.  Vision is clear no focal neurologic changes with regard to weakness and numbness.  He has had no fever  Depression screen [facility] 2/9 07/08/2019 11/04/2018 08/30/2018 07/05/2018 06/28/2018  Decreased Interest 0 0 0 0 0  Down, Depressed, Hopeless 0 0 0 0 0  PHQ - 2 Score 0 0 0 0 0     Relevant past medical, surgical, family and social history reviewed and updated as indicated.  Interim medical history since our last visit reviewed. Allergies and medications reviewed and updated.  ROS:  Review of Systems  Constitutional: Positive for fatigue. Negative for fever.  HENT: Negative.   Eyes: Negative for visual disturbance.  Respiratory: Negative for cough and shortness of breath.   Cardiovascular: Negative for chest pain and leg swelling.  Gastrointestinal: Negative for abdominal pain, diarrhea, nausea and vomiting.  Genitourinary: Negative for difficulty urinating.  Musculoskeletal: Positive for myalgias. Negative for arthralgias.  Skin: Negative for rash.  Neurological: Positive for headaches. Negative for dizziness, weakness and numbness.     Social History   Tobacco Use  Smoking Status Current Every Day Smoker  . Packs/day: 1.50  . Years: 40.00  . Pack  years: 60.00  . Types: Cigarettes  Smokeless Tobacco Never Used       Objective:     Wt Readings from Last 3 Encounters:  09/11/19 145 lb (65.8 kg)  02/25/19 145 lb (65.8 kg)  11/04/18 149 lb 9.6 oz (67.9 kg)     Exam deferred. Pt. Harboring due to COVID 19. Phone visit performed.   Assessment & Plan:   1. New daily persistent headache   2. Coronary artery disease involving native coronary artery of native heart with angina pectoris ([place])   3. TOBACCO ABUSE     Meds ordered this encounter  Medications  . SUMAtriptan (IMITREX) 50 MG tablet    Sig: Take 1 tablet (50 mg total) by mouth every 2 (two) hours as needed for headache. May repeat in 2 hours if headache persists or recurs. Limit 2 per 24 hours    Dispense:  20 tablet    Refill:  0  . traMADol (ULTRAM) 50 MG tablet    Sig: Take 1 tablet (50 mg total) by mouth 4 (four) times daily as needed for up to 5 days for moderate pain.    Dispense:  20 tablet    Refill:  0        Diagnoses and all orders for this visit:  New daily persistent headache -     SUMAtriptan (IMITREX) 50 MG tablet; Take 1 tablet (50 mg total) by mouth every 2 (two) hours as needed for headache. May repeat in 2  hours if headache persists or recurs. Limit 2 per 24 hours -     traMADol (ULTRAM) 50 MG tablet; Take 1 tablet (50 mg total) by mouth 4 (four) times daily as needed for up to 5 days for moderate pain.  Coronary artery disease involving native coronary artery of native heart with angina pectoris ([place])  TOBACCO ABUSE  Patient is having what sounds like a viral syndrome since he got the systemic symptoms with myalgias.  However it is reassuring that his Covid tests were negative.  Additionally his normal CT is reassuring.  He should notify the office if there are any changes in focal neurologic symptoms or the character and severity of the headache.  Meanwhile treatment will be based on symptoms at this time.  Of note is that it is PDMP report  he had been given some tramadol in the past by Dr. Warrick Parisian his primary.  Therefore I want to go ahead and repeat that today just for a few days for acute relief.  I also wrote for a few Imitrex.  Of note is that he does have coronary disease but he denied sit even though it shows up in the chart diagnosed by his cardiologist.  Therefore he was warned regarding the possibility of coronary coronary spasm and angina occurring with this medicine.  He will discontinue the medicine and seek help if that occurs.  I advised him to stay in a cool quiet environment.  Rest at home.  Out of work for this week.  Virtual Visit via telephone Note  I discussed the limitations, risks, security and privacy concerns of performing an evaluation and management service by telephone and the availability of in person appointments. The patient was identified with two identifiers. Pt.expressed understanding and agreed to proceed. Pt. Is at home. Dr. Livia Snellen is in his office.  Follow Up Instructions:   I discussed the assessment and treatment plan with the patient. The patient was provided an opportunity to ask questions and all were answered. The patient agreed with the plan and demonstrated an understanding of the instructions.   The patient was advised to call back or seek an in-person evaluation if the symptoms worsen or if the condition fails to improve as anticipated.   Total minutes including chart review and phone contact time: 23   Follow up plan: Return if symptoms worsen or fail to improve.  Claretta Fraise, MD [place]

## 2019-09-22 NOTE — ED Provider Notes (Signed)
[facility] EMERGENCY DEPARTMENT Provider Note   CSN: [id number] Arrival date & time: 09/11/19  [id number]     History Chief Complaint  Patient presents with  . Headache  . Generalized Body Aches    DERRICK MOCKLER is a 69 y.o. male.  HPI      SUBHAN AWADALLAH is a 69 y.o. male who presents to the Emergency Department complaining of gradual onset of daily headache for several days associated with subjective fever, chills, and generalized body aches.  He describes a throbbing generalized headache.  No sudden onset or thunder clap type headache.  He denies neck pain or stiffness, dizziness, visual changes, vomiting, chest pain, cough,, shortness fo breath, and loss of taste or smell.  No known covid exposures, but he is employed as a Administrator.      Past Medical History:  Diagnosis Date  . Allergy   . Anxiety    Patient denies  . Arthritis   . CAD (coronary artery disease)    The EF was 55% with inferior hypokinesis. Cath 09  . Carotid stenosis   . Coronary artery disease    left main had luminal irregularities, the LAD had 30% stenosis, the first diagonal had moderate-sized with ostial 35% stenosis, the circumflex in the AV groove had ostial 25% stenosis, the first obtuse marginal had long proximal 30% stenosis, the mid obtuse marginal was normal, the right coronary artery was dominant and occluded proximally.  There was excellent left to right collateral filling.     . Depression    Patient denies  . Diverticulitis   . Dyslipidemia   . Essential hypertension 08/24/2017  . GERD (gastroesophageal reflux disease)   . Lung nodule    benign  . Osteoporosis   . Tobacco abuse     Patient Active Problem List   Diagnosis Date Noted  . GERD (gastroesophageal reflux disease) 04/25/2019  . Acute bronchitis with COPD ([place]) 04/25/2019  . Scrotal mass 10/30/2017  . Coronary artery disease involving native coronary artery of native heart with angina pectoris ([place]) 08/24/2017  .  Essential hypertension 08/24/2017  . Chronic idiopathic constipation 10/27/2016  . Hyperthyroidism 03/06/2015  . 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 HLD (hyperlipidemia) 04/19/2009  . TOBACCO ABUSE 04/19/2009  . Coronary atherosclerosis 04/19/2009  . Occlusion and stenosis of carotid artery without mention of cerebral infarction 04/19/2009    Past Surgical History:  Procedure Laterality Date  . CAROTID ENDARTERECTOMY Right   . COLONOSCOPY    . LEFT HEART CATH AND CORONARY ANGIOGRAPHY N/A 08/04/2017   Procedure: LEFT HEART CATH AND CORONARY ANGIOGRAPHY;  Surgeon: Burnell Blanks, MD;  Location: [facility];  Service: Cardiovascular;  Laterality: N/A;  . left inguinal herniorrhaphy    . THROAT SURGERY         Family History  Problem Relation Age of Onset  . Lung disease Father   . Lupus Mother   . Lupus Brother   . Alcohol abuse Brother   . Lung disease Brother   . Cancer Brother        colon  . Lung disease Brother   . Healthy Daughter   . Healthy Son   . Healthy Son   . Healthy Son   . Esophageal cancer Neg Hx   . Rectal cancer Neg Hx   . Stomach cancer Neg Hx   . Pancreatic cancer Neg Hx     Social History   Tobacco Use  . Smoking status: Current Every  Day Smoker    Packs/day: 1.50    Years: 40.00    Pack years: 60.00    Types: Cigarettes  . Smokeless tobacco: Never Used  Substance Use Topics  . Alcohol use: Yes    Alcohol/week: 1.0 standard drinks    Types: 1 Shots of liquor per week  . Drug use: No    Home Medications Prior to Admission medications   Medication Sig Start Date End Date Taking? Authorizing Provider  albuterol (VENTOLIN HFA) 108 (90 Base) MCG/ACT inhaler Inhale 2 puffs into the lungs every 4 (four) hours as needed for wheezing or shortness of breath. 04/25/19  Yes Dettinger, Fransisca Kaufmann, MD  aspirin EC 81 MG tablet Take 81 mg by mouth daily.   Yes [provider]  Cholecalciferol (VITAMIN D-3 PO) Take 1 tablet by mouth  daily.    Yes [provider]  fluticasone (FLONASE) 50 MCG/ACT nasal spray Place 2 sprays into both nostrils daily. Patient taking differently: Place 2 sprays into both nostrils daily as needed (for allergies.).  05/26/17  Yes Timmothy Euler, MD  levothyroxine (SYNTHROID) 50 MCG tablet TAKE 1 TABLET DAILY Patient taking differently: Take 50 mcg by mouth daily before breakfast.  07/04/19  Yes Dettinger, Fransisca Kaufmann, MD  lisinopril (ZESTRIL) 20 MG tablet Take 1 tablet (20 mg total) by mouth daily. 11/04/18  Yes Dettinger, Fransisca Kaufmann, MD  Multiple Vitamin (MULTIVITAMIN WITH MINERALS) TABS tablet Take 1 tablet by mouth daily. Centrum Silver   Yes [provider]  naproxen sodium (ALEVE) 220 MG tablet Take 440 mg by mouth 2 (two) times daily as needed (for pain.).   Yes [provider]  nitroGLYCERIN (NITROSTAT) 0.4 MG SL tablet Place 1 tablet (0.4 mg total) under the tongue every 5 (five) minutes as needed for chest pain. 08/24/17 09/11/19 Yes Minus Breeding, MD  omeprazole (PRILOSEC) 20 MG capsule Take 1 capsule (20 mg total) by mouth daily. Patient taking differently: Take 20 mg by mouth every other day.  11/10/18  Yes Dettinger, Fransisca Kaufmann, MD  tiotropium (SPIRIVA HANDIHALER) 18 MCG inhalation capsule Place 1 capsule (18 mcg total) into inhaler and inhale daily. Patient taking differently: Place 18 mcg into inhaler and inhale daily as needed.  04/25/19  Yes Dettinger, Fransisca Kaufmann, MD  SUMAtriptan (IMITREX) 50 MG tablet Take 1 tablet (50 mg total) by mouth every 2 (two) hours as needed for headache. May repeat in 2 hours if headache persists or recurs. Limit 2 per 24 hours 09/12/19   Claretta Fraise, MD    Allergies    Crestor [rosuvastatin calcium], Flomax [tamsulosin hcl], and Lipitor [atorvastatin calcium]  Review of Systems   Review of Systems  Constitutional: Negative for activity change, appetite change and fever.  HENT: Negative for congestion, facial swelling, sore throat and  trouble swallowing.   Eyes: Negative for photophobia, pain and visual disturbance.  Respiratory: Negative for shortness of breath.   Cardiovascular: Negative for chest pain.  Gastrointestinal: Negative for abdominal pain, nausea and vomiting.  Musculoskeletal: Positive for myalgias. Negative for neck pain and neck stiffness.  Skin: Negative for rash and wound.  Neurological: Positive for headaches. Negative for dizziness, syncope, facial asymmetry, speech difficulty, weakness and numbness.  Psychiatric/Behavioral: Negative for confusion and decreased concentration.    Physical Exam Updated Vital Signs BP 119/62 (BP Location: Right Arm)   Pulse 70   Temp 97.6 F (36.4 C) (Oral)   Resp 17   Ht 5\' 10"  (1.778 m)   Wt 65.8  kg   SpO2 98%   BMI 20.81 kg/m   Physical Exam Vitals and nursing note reviewed.  Constitutional:      General: He is not in acute distress.    Appearance: He is well-developed. He is not ill-appearing.  HENT:     Head: Normocephalic and atraumatic.  Eyes:     Extraocular Movements: Extraocular movements intact.     Conjunctiva/sclera: Conjunctivae normal.     Pupils: Pupils are equal, round, and reactive to light.  Neck:     Trachea: Phonation normal.     Meningeal: Kernig's sign absent.  Cardiovascular:     Rate and Rhythm: Normal rate and regular rhythm.     Pulses: Normal pulses.  Pulmonary:     Effort: Pulmonary effort is normal. No respiratory distress.     Breath sounds: Normal breath sounds.  Abdominal:     General: There is no distension.     Palpations: Abdomen is soft.     Tenderness: There is no abdominal tenderness.  Musculoskeletal:        General: Normal range of motion.     Cervical back: Normal range of motion and neck supple. No rigidity. No spinous process tenderness or muscular tenderness.     Right lower leg: No edema.     Left lower leg: No edema.  Skin:    General: Skin is warm.     Capillary Refill: Capillary refill takes  less than 2 seconds.     Findings: No rash.  Neurological:     Mental Status: He is alert and oriented to person, place, and time.     GCS: GCS eye subscore is 4. GCS verbal subscore is 5. GCS motor subscore is 6.     Cranial Nerves: No cranial nerve deficit.     Sensory: Sensation is intact. No sensory deficit.     Motor: Motor function is intact. No weakness or abnormal muscle tone.     Gait: Gait normal.     Deep Tendon Reflexes:     Reflex Scores:      Tricep reflexes are 2+ on the right side and 2+ on the left side.      Bicep reflexes are 2+ on the right side and 2+ on the left side.    Comments: CN III-XII intact.  Speech clear.  mentating well.  No pronator drift.  nml finger nose and heel shin testing.    Psychiatric:        Thought Content: Thought content normal.     ED Results / Procedures / Treatments   Labs (all labs ordered are listed, but only abnormal results are displayed) Labs Reviewed  BASIC METABOLIC PANEL - Abnormal; Notable for the following components:      Result Value   Glucose, Bld 103 (*)    All other components within normal limits  SARS CORONAVIRUS 2 (TAT 6-24 HRS)  CBC WITH DIFFERENTIAL/PLATELET  POC SARS CORONAVIRUS 2 AG -  ED    EKG None  Radiology CT HEAD WO CONTRAST  Result Date: 09/11/2019 CLINICAL DATA:  Headache fever and chills. History of hypertension EXAM: CT HEAD WITHOUT CONTRAST TECHNIQUE: Contiguous axial images were obtained from the base of the skull through the vertex without intravenous contrast. COMPARISON:  None. FINDINGS: Brain: No evidence of acute infarction, hemorrhage, hydrocephalus, extra-axial collection or mass lesion/mass effect. Vascular: No hyperdense vessel or unexpected calcification. Skull: Normal. Negative for fracture or focal lesion. Sinuses/Orbits: No acute finding. Other: None. IMPRESSION: No  acute intracranial pathology. Electronically Signed   By: Audie Pinto M.D.   On: 09/11/2019 11:20      Procedures Procedures (including critical care time)  Medications Ordered in ED Medications  ketorolac (TORADOL) 15 MG/ML injection 15 mg (15 mg Intravenous Given 09/11/19 1019)  diphenhydrAMINE (BENADRYL) injection 12.5 mg (12.5 mg Intravenous Given 09/11/19 1019)  metoCLOPramide (REGLAN) injection 10 mg (10 mg Intravenous Given 09/11/19 1018)    ED Course  I have reviewed the triage vital signs and the nursing notes.  Pertinent labs & imaging results that were available during my care of the patient were reviewed by me and considered in my medical decision making (see chart for details).    MDM Rules/Calculators/A&P                      Pt is well appearing male with hx of generalized headache for several days, gradual in onset.  Associated with subjective fever, body aches and malaise.  No focal neuro deficits, no meningeal signs and vitals here are reassuring.  Work up also reassuring and pt also seen by Dr. Langston Masker.  Care plan discussed.  Sx's felt to be viral.  Covid PCR test pending, POC test neg.    On recheck, pt reports sx's improved and he is requesting d/c home.  He agrees to home quarantine instructions and appears appropriate for d/c home.  Will f/u with PCP.  Return precautions discussed.    EMETT ORFIELD was evaluated in Emergency Department on 09/22/2019 for the symptoms described in the history of present illness. He was evaluated in the context of the global COVID-19 pandemic, which necessitated consideration that the patient might be at risk for infection with the SARS-CoV-2 virus that causes COVID-19. Institutional protocols and algorithms that pertain to the evaluation of patients at risk for COVID-19 are in a state of rapid change based on information released by regulatory bodies including the CDC and federal and state organizations. These policies and algorithms were followed during the patient's care in the ED.  Final Clinical Impression(s) / ED  Diagnoses Final diagnoses:  Bad headache    Rx / DC Orders ED Discharge Orders    None       Bufford Lope 09/22/19 1822    Wyvonnia Dusky, MD 09/24/19 1301

## 2019-10-10 ENCOUNTER — Ambulatory Visit (INDEPENDENT_AMBULATORY_CARE_PROVIDER_SITE_OTHER): Payer: PPO | Admitting: *Deleted

## 2019-10-10 DIAGNOSIS — I2510 Atherosclerotic heart disease of native coronary artery without angina pectoris: Secondary | ICD-10-CM | POA: Diagnosis not present

## 2019-10-10 DIAGNOSIS — I10 Essential (primary) hypertension: Secondary | ICD-10-CM | POA: Diagnosis not present

## 2019-10-10 DIAGNOSIS — E782 Mixed hyperlipidemia: Secondary | ICD-10-CM | POA: Diagnosis not present

## 2019-10-10 NOTE — Chronic Care Management (AMB) (Signed)
Chronic Care Management   Initial Visit Note  10/10/2019 Name: [name] [name] MRN: [id number] DOB: October 06, 1950  Referred by: Dettinger, Fransisca Kaufmann, MD Reason for referral : Chronic Care Management (Initial Visit)   [name] [name] is a 69 y.o. year old male who is a primary care patient of Dettinger, Fransisca Kaufmann, MD. The CCM team was consulted for assistance with chronic disease management and care coordination needs related to CAD, HTN, GERD, hypothyroidism, osteoporosis, arthritis, HLD  Review of patient status, including review of consultants reports, relevant laboratory and other test results, and collaboration with appropriate care team members and the patient's provider was performed as part of comprehensive patient evaluation and provision of chronic care management services.    Subjective: I spoke with [name] [name] by telephone today. He does not have any problems with access to food, transportation, medication, or adequate housing. 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 He feels that he is managing his conditions well at this time but agrees to follow-up with CCM team over the next 90 days.   SDOH (Social Determinants of Health) assessments performed: Yes See Care Plan activities for detailed interventions related to SDOH    Objective: Outpatient Encounter Medications as of 10/10/2019  Medication Sig  . albuterol (VENTOLIN HFA) 108 (90 Base) MCG/ACT inhaler Inhale 2 puffs into the lungs every 4 (four) hours as needed for wheezing or shortness of breath.  Marland Kitchen aspirin EC 81 MG tablet Take 81 mg by mouth daily.  . Cholecalciferol (VITAMIN D-3 PO) Take 1 tablet by mouth daily.   . fluticasone (FLONASE) 50 MCG/ACT nasal spray Place 2 sprays into both nostrils daily. (Patient taking differently: Place 2 sprays into both nostrils daily as needed (for allergies.). )  . levothyroxine (SYNTHROID) 50 MCG tablet TAKE 1 TABLET DAILY (Patient taking  differently: Take 50 mcg by mouth daily before breakfast. )  . lisinopril (ZESTRIL) 20 MG tablet Take 1 tablet (20 mg total) by mouth daily.  . Multiple Vitamin (MULTIVITAMIN WITH MINERALS) TABS tablet Take 1 tablet by mouth daily. Centrum Silver  . naproxen sodium (ALEVE) 220 MG tablet Take 440 mg by mouth 2 (two) times daily as needed (for pain.).  Marland Kitchen nitroGLYCERIN (NITROSTAT) 0.4 MG SL tablet Place 1 tablet (0.4 mg total) under the tongue every 5 (five) minutes as needed for chest pain.  Marland Kitchen omeprazole (PRILOSEC) 20 MG capsule Take 1 capsule (20 mg total) by mouth daily. (Patient taking differently: Take 20 mg by mouth every other day. )  . SUMAtriptan (IMITREX) 50 MG tablet Take 1 tablet (50 mg total) by mouth every 2 (two) hours as needed for headache. May repeat in 2 hours if headache persists or recurs. Limit 2 per 24 hours  . tiotropium (SPIRIVA HANDIHALER) 18 MCG inhalation capsule Place 1 capsule (18 mcg total) into inhaler and inhale daily. (Patient taking differently: Place 18 mcg into inhaler and inhale daily as needed. )   No facility-administered encounter medications on file as of 10/10/2019.      RN Care Plan   . Chronic Disease Management Needs       CARE PLAN ENTRY (see longtitudinal plan of care for additional care plan information)  Current Barriers:  . Chronic Disease Management support, education, and care coordination needs related to CAD, HTN, GERD, hypothyroidism, osteoporosis, arthritis, HLD  Clinical Goal(s) related to CAD, HTN, GERD, hypothyroidism, osteoporosis, arthritis, HLD:  Over the next 90 days, patient will:  .  Work with the care management team to address educational, disease management, and care coordination needs  . Begin or continue self health monitoring activities as directed today Measure and record blood pressure 3 times per week . Call provider office for new or worsened signs and symptoms Blood pressure findings outside established parameters . Call  care management team with questions or concerns . Verbalize basic understanding of patient centered plan of care established today  Interventions related to CAD, HTN, GERD, hypothyroidism, osteoporosis, arthritis, HLD:  . Evaluation of current treatment plans and patient's adherence to plan as established by provider . Assessed patient understanding of disease states . Assessed patient's education and care coordination needs . Provided disease specific education to patient  . Collaborated with appropriate clinical care team members regarding patient needs  Patient Self Care Activities related to CAD, HTN, GERD, hypothyroidism, osteoporosis, arthritis, HLD:  . Patient is able to independently perform ADLs and IADLs  Initial goal documentation           Follow-up Plan:   The care management team will reach out to the patient again over the next 90 days.   Chong Sicilian, BSN, RN-BC Embedded Chronic Care Manager [facility] / [place] [facility] Direct Dial: [phone]

## 2019-10-10 NOTE — Patient Instructions (Signed)
Visit Information  Goals Addressed            This Visit's Progress   . Chronic Disease Management Needs       CARE PLAN ENTRY (see longtitudinal plan of care for additional care plan information)  Current Barriers:  . Chronic Disease Management support, education, and care coordination needs related to CAD, HTN, GERD, hypothyroidism, osteoporosis, arthritis, HLD  Clinical Goal(s) related to CAD, HTN, GERD, hypothyroidism, osteoporosis, arthritis, HLD:  Over the next 90 days, patient will:  . Work with the care management team to address educational, disease management, and care coordination needs  . Begin or continue self health monitoring activities as directed today Measure and record blood pressure 3 times per week . Call provider office for new or worsened signs and symptoms Blood pressure findings outside established parameters . Call care management team with questions or concerns . Verbalize basic understanding of patient centered plan of care established today  Interventions related to CAD, HTN, GERD, hypothyroidism, osteoporosis, arthritis, HLD:  . Evaluation of current treatment plans and patient's adherence to plan as established by provider . Assessed patient understanding of disease states . Assessed patient's education and care coordination needs . Provided disease specific education to patient  . Collaborated with appropriate clinical care team members regarding patient needs  Patient Self Care Activities related to CAD, HTN, GERD, hypothyroidism, osteoporosis, arthritis, HLD:  . Patient is able to independently perform ADLs and IADLs  Initial goal documentation          Mr. Osment was given information about Chronic Care Management services today including:  1. CCM service includes personalized support from designated clinical staff supervised by his physician, including individualized plan of care and coordination with other care providers 2. 24/7  contact phone numbers for assistance for urgent and routine care needs. 3. Service will only be billed when office clinical staff spend 20 minutes or more in a month to coordinate care. 4. Only one practitioner may furnish and bill the service in a calendar month. 5. The patient may stop CCM services at any time (effective at the end of the month) by phone call to the office staff. 6. The patient will be responsible for cost sharing (co-pay) of up to 20% of the service fee (after annual deductible is met).  Patient agreed to services and verbal consent obtained.   The patient verbalized understanding of instructions provided today and declined a print copy of patient instruction materials.   The care management team will reach out to the patient again over the next 90 days.   Chong Sicilian, BSN, RN-BC Embedded Chronic Care Manager [facility] [place] [facility] Direct Dial: [phone]

## 2019-12-29 ENCOUNTER — Other Ambulatory Visit: Payer: Self-pay | Admitting: Family Medicine

## 2019-12-29 DIAGNOSIS — I10 Essential (primary) hypertension: Secondary | ICD-10-CM

## 2019-12-30 NOTE — Telephone Encounter (Signed)
Last OV 04/25/19. Last RF 30 day supply given today. Next OV not scheduled  ntbs for further refills

## 2020-01-03 ENCOUNTER — Other Ambulatory Visit: Payer: Self-pay | Admitting: Family Medicine

## 2020-01-10 ENCOUNTER — Telehealth: Payer: Self-pay | Admitting: *Deleted

## 2020-01-10 ENCOUNTER — Ambulatory Visit (INDEPENDENT_AMBULATORY_CARE_PROVIDER_SITE_OTHER): Payer: PPO | Admitting: *Deleted

## 2020-01-10 DIAGNOSIS — I10 Essential (primary) hypertension: Secondary | ICD-10-CM | POA: Diagnosis not present

## 2020-01-10 DIAGNOSIS — I25119 Atherosclerotic heart disease of native coronary artery with unspecified angina pectoris: Secondary | ICD-10-CM | POA: Diagnosis not present

## 2020-01-10 NOTE — Telephone Encounter (Signed)
01/10/2020  I spoke with [name] [name] by telephone today regarding management of his chronic medical conditions.   He needs a routine visit with Dr Dettinger within the next month to f/u on chronic medical conditions, including hypertension. He has decreased his lisinopril on his own to one tablet every other day because he felt rundown and tired all of the time and was having pain in his feet and legs. Reports that he feels much better now. He is not checking his blood pressure at home but he does have access to a monitor. I have asked him to check and record his blood pressure daily.   I will forward to [facility] clinical staff to contact patient for scheduling because I did not see many available appointment openings where the front office staff can schedule. Appointment should be in person to evaluate blood pressure and have labs drawn.   Chong Sicilian, BSN, RN-BC Embedded Chronic Care Manager [facility] [place] [facility] Direct Dial: [phone]

## 2020-01-10 NOTE — Telephone Encounter (Signed)
Appt scheduled/ patient aware.

## 2020-01-10 NOTE — Telephone Encounter (Signed)
Yes I agree he just needs to check his blood pressure and give [place] some numbers over the next week or 2 and let us know how it is running.  If he would like to have it come in and check by the triage nurse and have him schedule a triage appointment for that.

## 2020-01-10 NOTE — Chronic Care Management (AMB) (Signed)
Chronic Care Management   Follow Up Note   01/10/2020 Name: [name] [name] MRN: [id number] DOB: Nov 04, 1950  Referred by: Dettinger, Fransisca Kaufmann, MD Reason for referral : Chronic Care Management (RN follow up)   [name] [name] is a 69 y.o. year old male who is a primary care patient of Dettinger, Fransisca Kaufmann, MD. The CCM team was consulted for assistance with chronic disease management and care coordination needs.    Review of patient status, including review of consultants reports, relevant laboratory and other test results, and collaboration with appropriate care team members and the patient's provider was performed as part of comprehensive patient evaluation and provision of chronic care management services.    I spoke with [name] [name] by telephone today regarding management of his chronic medical conditions.   SDOH (Social Determinants of Health) assessments performed: No See Care Plan activities for detailed interventions related to [facility])     Outpatient Encounter Medications as of 01/10/2020  Medication Sig Note  . albuterol (VENTOLIN HFA) 108 (90 Base) MCG/ACT inhaler Inhale 2 puffs into the lungs every 4 (four) hours as needed for wheezing or shortness of breath.   Marland Kitchen aspirin EC 81 MG tablet Take 81 mg by mouth daily.   . Cholecalciferol (VITAMIN D-3 PO) Take 1 tablet by mouth daily.    . fluticasone (FLONASE) 50 MCG/ACT nasal spray Place 2 sprays into both nostrils daily. (Patient taking differently: Place 2 sprays into both nostrils daily as needed (for allergies.). )   . levothyroxine (SYNTHROID) 50 MCG tablet Take 1 tablet (50 mcg total) by mouth daily. (Needs to be seen before next refill)   . lisinopril (ZESTRIL) 20 MG tablet Take 1 tablet (20 mg total) by mouth daily. Needs appointment for further refills 01/10/2020: 01/10/20 Per patient, taking 20mg  QOD instead of QD  . Multiple Vitamin (MULTIVITAMIN WITH MINERALS) TABS tablet Take 1 tablet by mouth daily. Centrum Silver   .  naproxen sodium (ALEVE) 220 MG tablet Take 440 mg by mouth 2 (two) times daily as needed (for pain.).   Marland Kitchen nitroGLYCERIN (NITROSTAT) 0.4 MG SL tablet Place 1 tablet (0.4 mg total) under the tongue every 5 (five) minutes as needed for chest pain.   Marland Kitchen omeprazole (PRILOSEC) 20 MG capsule Take 1 capsule (20 mg total) by mouth daily. (Patient taking differently: Take 20 mg by mouth every other day. )   . SUMAtriptan (IMITREX) 50 MG tablet Take 1 tablet (50 mg total) by mouth every 2 (two) hours as needed for headache. May repeat in 2 hours if headache persists or recurs. Limit 2 per 24 hours   . tiotropium (SPIRIVA HANDIHALER) 18 MCG inhalation capsule Place 1 capsule (18 mcg total) into inhaler and inhale daily. (Patient taking differently: Place 18 mcg into inhaler and inhale daily as needed. )    No facility-administered encounter medications on file as of 01/10/2020.    BP Readings from Last 3 Encounters:  09/11/19 119/62  02/25/19 139/71  11/04/18 (!) 167/86     RN Care Plan             This Visit's Progress     Patient Stated   .  "I need to manage my blood pressure" (pt-stated)        CARE PLAN ENTRY (see longitudinal plan of care for additional care plan information)  Current Barriers:  . Chronic Disease Management support and education needs related to hypertension and CAD . Side effects of blood pressure medication  Nurse  Case Manager Clinical Goal(s):  Marland Kitchen Over the next 60 days, patient will talk with RN Care Manager regarding hypertension management . Over the next 30 days, patient will have an appointment with his PCP to follow-up on chronic medical conditions  Interventions:  . Inter-disciplinary care team collaboration (see longitudinal plan of care) . Chart reviewed, including recent office notes, ED reports, and lab results . Talked with patient by telephone . Discussed ED visit in 08/2019 for headaches. Symptoms have resolved.  . Reviewed and discussed  medications o Patient has decreased lisinopril 20mg  on his own to every-other-day o Was felling very fatigued and rundown and experiencing foot and leg pain o Reports that symptoms have resolved since decreasing lisinopril . Discussed home blood pressure readings o Patient has access to a monitor but is not checking his blood pressure regularly . Advised patient to check and record his blood pressure daily and to reach out to PCP with any readings outside of recommended range . Advised that he is due for a follow-up with Dr Warrick Parisian . Collaborated with [facility] clinical staff to schedule a routine f/u with Dr Dettinger for 01/25/20 . RN will communicate mediation changes with PCP  . Discussed stress at work . Encouraged patient to reach out to [facility] team as needed  Patient Self Care Activities:  . Performs ADL's independently . Performs IADL's independently  Initial goal documentation         Plan:   The care management team will reach out to the patient again over the next 60 days.   appt with Dr Dettinger 01/25/20   Chong Sicilian, BSN, RN-BC [place] / [place] Management Direct Dial: [phone]

## 2020-01-10 NOTE — Patient Instructions (Signed)
Visit Information  Goals Addressed              This Visit's Progress     Patient Stated   .  "I need to manage my blood pressure" (pt-stated)        CARE PLAN ENTRY (see longitudinal plan of care for additional care plan information)  Current Barriers:  . Chronic Disease Management support and education needs related to hypertension . Side effects of blood pressure medication  Nurse Case Manager Clinical Goal(s):  Marland Kitchen Over the next 60 days, patient will talk with RN Care Manager regarding hypertension management . Over the next 30 days, patient will have an appointment with his PCP to follow-up on chronic medical conditions  Interventions:  . Inter-disciplinary care team collaboration (see longitudinal plan of care) . Chart reviewed, including recent office notes, ED reports, and lab results . Talked with patient by telephone . Discussed ED visit in 08/2019 for headaches. Symptoms have resolved.  . Reviewed and discussed medications o Patient has decreased lisinopril 20mg  on his own to every-other-day o Was felling very fatigued and rundown and experiencing foot and leg pain o Reports that symptoms have resolved since decreasing lisinopril . Discussed home blood pressure readings o Patient has access to a monitor but is not checking his blood pressure regularly . Advised patient to check and record his blood pressure daily and to reach out to PCP with any readings outside of recommended range . Advised that he is due for a follow-up with Dr Warrick Parisian . Collaborated with [facility] clinical staff to schedule a routine f/u with Dr Dettinger for 01/25/20 . RN will communicate mediation changes with PCP  . Discussed stress at work . Encouraged patient to reach out to [facility] team as needed  Patient Self Care Activities:  . Performs ADL's independently . Performs IADL's independently  Initial goal documentation        Patient verbalizes understanding of instructions provided today.    Follow-up Plan The care management team will reach out to the patient again over the next 60 days.   PCP appt 01/25/20  Chong Sicilian, BSN, RN-BC [place] [facility] [place] Management Direct Dial: [phone]

## 2020-01-10 NOTE — Telephone Encounter (Signed)
Appointment scheduled for 01/25/20 with Dr Warrick Parisian. Forwarding the following portion of the original note to Dr Dettinger for review and recommendation if needed.   "He has decreased his lisinopril 20mg  on his own to one tablet every other day because he felt rundown and tired all of the time and was having pain in his feet and legs. Reports that he feels much better now. He is not checking his blood pressure at home but he does have access to a monitor. I have asked him to check and record his blood pressure daily."  Chong Sicilian, BSN, RN-BC [place] [facility] [place] Management Direct Dial: [phone]

## 2020-01-25 ENCOUNTER — Ambulatory Visit: Payer: PPO | Admitting: Family Medicine

## 2020-03-02 ENCOUNTER — Other Ambulatory Visit: Payer: Self-pay | Admitting: Family Medicine

## 2020-03-02 DIAGNOSIS — J209 Acute bronchitis, unspecified: Secondary | ICD-10-CM

## 2020-03-02 DIAGNOSIS — J440 Chronic obstructive pulmonary disease with acute lower respiratory infection: Secondary | ICD-10-CM

## 2020-03-02 DIAGNOSIS — J441 Chronic obstructive pulmonary disease with (acute) exacerbation: Secondary | ICD-10-CM

## 2020-03-02 MED ORDER — ALBUTEROL SULFATE HFA 108 (90 BASE) MCG/ACT IN AERS: 2.0000 | INHALATION_SPRAY | RESPIRATORY_TRACT | 0 refills | Status: DC | PRN

## 2020-03-02 NOTE — Telephone Encounter (Signed)
  Prescription Request  03/02/2020  What is the name of the medication or equipment? Inhaler/ pt is out and has appt 09/14  Have you contacted your pharmacy to request a refill? (if applicable) no  Which pharmacy would you like this sent to? [facility]   Patient notified that their request is being sent to the clinical staff for review and that they should receive a response within 2 business days.

## 2020-03-02 NOTE — Telephone Encounter (Signed)
Pt aware refill sent to pharmacy 

## 2020-03-06 ENCOUNTER — Other Ambulatory Visit: Payer: Self-pay

## 2020-03-06 ENCOUNTER — Encounter: Payer: Self-pay | Admitting: Family Medicine

## 2020-03-06 ENCOUNTER — Ambulatory Visit (INDEPENDENT_AMBULATORY_CARE_PROVIDER_SITE_OTHER): Payer: PPO | Admitting: Family Medicine

## 2020-03-06 VITALS — BP 124/65 | HR 79 | Temp 98.0°F | Ht 70.0 in | Wt 147.0 lb

## 2020-03-06 DIAGNOSIS — J209 Acute bronchitis, unspecified: Secondary | ICD-10-CM

## 2020-03-06 DIAGNOSIS — K219 Gastro-esophageal reflux disease without esophagitis: Secondary | ICD-10-CM

## 2020-03-06 DIAGNOSIS — E039 Hypothyroidism, unspecified: Secondary | ICD-10-CM | POA: Diagnosis not present

## 2020-03-06 DIAGNOSIS — J449 Chronic obstructive pulmonary disease, unspecified: Secondary | ICD-10-CM | POA: Diagnosis not present

## 2020-03-06 DIAGNOSIS — J440 Chronic obstructive pulmonary disease with acute lower respiratory infection: Secondary | ICD-10-CM | POA: Diagnosis not present

## 2020-03-06 DIAGNOSIS — J441 Chronic obstructive pulmonary disease with (acute) exacerbation: Secondary | ICD-10-CM | POA: Diagnosis not present

## 2020-03-06 DIAGNOSIS — E782 Mixed hyperlipidemia: Secondary | ICD-10-CM

## 2020-03-06 DIAGNOSIS — I10 Essential (primary) hypertension: Secondary | ICD-10-CM | POA: Diagnosis not present

## 2020-03-06 MED ORDER — LEVOTHYROXINE SODIUM 50 MCG PO TABS: 50.0000 ug | ORAL_TABLET | Freq: Every day | ORAL | 3 refills | Status: DC

## 2020-03-06 MED ORDER — NITROGLYCERIN 0.4 MG SL SUBL: 0.4000 mg | SUBLINGUAL_TABLET | SUBLINGUAL | 3 refills | Status: DC | PRN

## 2020-03-06 MED ORDER — SPIRIVA HANDIHALER 18 MCG IN CAPS: 18.0000 ug | ORAL_CAPSULE | Freq: Every day | RESPIRATORY_TRACT | 12 refills | Status: DC

## 2020-03-06 MED ORDER — OMEPRAZOLE 20 MG PO CPDR: 20.0000 mg | DELAYED_RELEASE_CAPSULE | Freq: Every day | ORAL | 3 refills | Status: DC

## 2020-03-06 MED ORDER — LISINOPRIL 20 MG PO TABS: 20.0000 mg | ORAL_TABLET | Freq: Every day | ORAL | 3 refills | Status: DC

## 2020-03-06 MED ORDER — ALBUTEROL SULFATE HFA 108 (90 BASE) MCG/ACT IN AERS: 2.0000 | INHALATION_SPRAY | RESPIRATORY_TRACT | 5 refills | Status: DC | PRN

## 2020-03-06 NOTE — Progress Notes (Signed)
 BP 124/65   Pulse 79   Temp 98 F (36.7 C)   Ht 5' 10" (1.778 m)   Wt 147 lb (66.7 kg)   SpO2 96%   BMI 21.09 kg/m    Subjective:   Patient ID: [name] [name], male    DOB: 04/23/1951, 69 y.o.   MRN: [id number]  HPI: [name] [name] is a 69 y.o. male presenting on 03/06/2020 for Medical Management of Chronic Issues, Hypertension, and Gastroesophageal Reflux   HPI Hypertension Patient is currently on lisinopril, and their blood pressure today is 124/65. Patient denies any lightheadedness or dizziness. Patient denies headaches, blurred vision, chest pains, shortness of breath, or weakness. Denies any side effects from medication and is content with current medication.  Hypothyroidism recheck Patient is coming in for thyroid recheck today as well. They deny any issues with hair changes or heat or cold problems or diarrhea or constipation. They deny any chest pain or palpitations. They are currently on levothyroxine 50 micrograms   Hyperlipidemia Patient is coming in for recheck of his hyperlipidemia. The patient is currently taking no medication and will monitor with blood work.. They deny any issues with myalgias or history of liver damage from it. They deny any focal numbness or weakness or chest pain.   GERD Patient is currently on omeprazole.  She denies any major symptoms or abdominal pain or belching or burping. She denies any blood in her stool or lightheadedness or dizziness.   Relevant past medical, surgical, family and social history reviewed and updated as indicated. Interim medical history since our last visit reviewed. Allergies and medications reviewed and updated.  Review of Systems  Constitutional: Negative for chills and fever.  Eyes: Negative for visual disturbance.  Respiratory: Negative for shortness of breath and wheezing.   Cardiovascular: Negative for chest pain and leg swelling.  Musculoskeletal: Negative for back pain and gait problem.  Skin:  Negative for rash.  Neurological: Negative for dizziness, weakness and light-headedness.  All other systems reviewed and are negative.   Per HPI unless specifically indicated above   Allergies as of 03/06/2020      Reactions   Crestor [rosuvastatin Calcium] Other (See Comments)   Lethargy and muscle aches   Flomax [tamsulosin Hcl] Other (See Comments)   Made patient hypotensive, clammy, sweaty, and feel like he was going to pass out (EMS HAD TO BE CALLED)   Lipitor [atorvastatin Calcium] Other (See Comments)   Body cramps, lethargy and muscle aches      Medication List       Accurate as of March 06, 2020  4:08 PM. If you have any questions, ask your nurse or doctor.        albuterol 108 (90 Base) MCG/ACT inhaler Commonly known as: VENTOLIN HFA Inhale 2 puffs into the lungs every 4 (four) hours as needed for wheezing or shortness of breath.   aspirin EC 81 MG tablet Take 81 mg by mouth daily.   fluticasone 50 MCG/ACT nasal spray Commonly known as: FLONASE Place 2 sprays into both nostrils daily. What changed:   when to take this  reasons to take this   levothyroxine 50 MCG tablet Commonly known as: SYNTHROID Take 1 tablet (50 mcg total) by mouth daily. What changed: additional instructions Changed by: Joshua A Dettinger, MD   lisinopril 20 MG tablet Commonly known as: ZESTRIL Take 1 tablet (20 mg total) by mouth daily. What changed: additional instructions Changed by: Joshua A Dettinger, MD   multivitamin   with minerals Tabs tablet Take 1 tablet by mouth daily. Centrum Silver   naproxen sodium 220 MG tablet Commonly known as: ALEVE Take 440 mg by mouth 2 (two) times daily as needed (for pain.).   nitroGLYCERIN 0.4 MG SL tablet Commonly known as: NITROSTAT Place 1 tablet (0.4 mg total) under the tongue every 5 (five) minutes as needed for chest pain.   omeprazole 20 MG capsule Commonly known as: PRILOSEC Take 1 capsule (20 mg total) by mouth  daily. What changed: when to take this   Spiriva HandiHaler 18 MCG inhalation capsule Generic drug: tiotropium Place 1 capsule (18 mcg total) into inhaler and inhale daily. What changed:   when to take this  reasons to take this   SUMAtriptan 50 MG tablet Commonly known as: Imitrex Take 1 tablet (50 mg total) by mouth every 2 (two) hours as needed for headache. May repeat in 2 hours if headache persists or recurs. Limit 2 per 24 hours   VITAMIN D-3 PO Take 1 tablet by mouth daily.        Objective:   BP 124/65   Pulse 79   Temp 98 F (36.7 C)   Ht 5' 10" (1.778 m)   Wt 147 lb (66.7 kg)   SpO2 96%   BMI 21.09 kg/m   Wt Readings from Last 3 Encounters:  03/06/20 147 lb (66.7 kg)  09/11/19 145 lb (65.8 kg)  02/25/19 145 lb (65.8 kg)    Physical Exam Vitals and nursing note reviewed.  Constitutional:      General: He is not in acute distress.    Appearance: He is well-developed. He is not diaphoretic.  Eyes:     General: No scleral icterus.    Conjunctiva/sclera: Conjunctivae normal.  Neck:     Thyroid: No thyromegaly.  Cardiovascular:     Rate and Rhythm: Normal rate and regular rhythm.     Heart sounds: Normal heart sounds. No murmur heard.   Pulmonary:     Effort: Pulmonary effort is normal. No respiratory distress.     Breath sounds: Normal breath sounds. No wheezing.  Musculoskeletal:        General: Normal range of motion.     Cervical back: Neck supple.  Lymphadenopathy:     Cervical: No cervical adenopathy.  Skin:    General: Skin is warm and dry.     Findings: No rash.  Neurological:     Mental Status: He is alert and oriented to person, place, and time.     Coordination: Coordination normal.  Psychiatric:        Behavior: Behavior normal.       Assessment & Plan:   Problem List Items Addressed This Visit      Cardiovascular and Mediastinum   Essential hypertension   Relevant Medications   nitroGLYCERIN (NITROSTAT) 0.4 MG SL tablet    lisinopril (ZESTRIL) 20 MG tablet   Other Relevant Orders   CMP14+EGFR     Respiratory   COPD (chronic obstructive pulmonary disease) (HCC)   Relevant Medications   albuterol (VENTOLIN HFA) 108 (90 Base) MCG/ACT inhaler     Digestive   GERD (gastroesophageal reflux disease)   Relevant Medications   omeprazole (PRILOSEC) 20 MG capsule   Other Relevant Orders   CBC with Differential/Platelet     Endocrine   Hypothyroidism - Primary   Relevant Medications   levothyroxine (SYNTHROID) 50 MCG tablet   Other Relevant Orders   TSH     Other  HLD (hyperlipidemia)   Relevant Medications   nitroGLYCERIN (NITROSTAT) 0.4 MG SL tablet   lisinopril (ZESTRIL) 20 MG tablet   Other Relevant Orders   Lipid panel    Other Visit Diagnoses    COPD exacerbation (HCC)       Relevant Medications   albuterol (VENTOLIN HFA) 108 (90 Base) MCG/ACT inhaler   Other Relevant Orders   CBC with Differential/Platelet    Continue current medication, will follow up with labs, he will come back on Thursday and do labs for us.  Sent refills for the patient.  Patient declines smoking cessation  Follow up plan: No follow-ups on file.  Counseling provided for all of the vaccine components Orders Placed This Encounter  Procedures  . CBC with Differential/Platelet  . CMP14+EGFR  . Lipid panel  . TSH    Joshua Dettinger, MD [facility] 03/06/2020, 4:08 PM     

## 2020-03-09 ENCOUNTER — Other Ambulatory Visit: Payer: PPO

## 2020-03-09 ENCOUNTER — Other Ambulatory Visit: Payer: Self-pay

## 2020-03-09 DIAGNOSIS — E782 Mixed hyperlipidemia: Secondary | ICD-10-CM | POA: Diagnosis not present

## 2020-03-09 DIAGNOSIS — E039 Hypothyroidism, unspecified: Secondary | ICD-10-CM | POA: Diagnosis not present

## 2020-03-09 DIAGNOSIS — I10 Essential (primary) hypertension: Secondary | ICD-10-CM

## 2020-03-09 DIAGNOSIS — J441 Chronic obstructive pulmonary disease with (acute) exacerbation: Secondary | ICD-10-CM

## 2020-03-09 DIAGNOSIS — K219 Gastro-esophageal reflux disease without esophagitis: Secondary | ICD-10-CM | POA: Diagnosis not present

## 2020-03-10 LAB — CMP14+EGFR
ALT: 12 IU/L (ref 0–44)
AST: 20 IU/L (ref 0–40)
Albumin/Globulin Ratio: 1.7 (ref 1.2–2.2)
Albumin: 4.7 g/dL (ref 3.8–4.8)
Alkaline Phosphatase: 125 IU/L — ABNORMAL HIGH (ref 44–121)
BUN/Creatinine Ratio: 13 (ref 10–24)
BUN: 16 mg/dL (ref 8–27)
Bilirubin Total: 0.5 mg/dL (ref 0.0–1.2)
CO2: 23 mmol/L (ref 20–29)
Calcium: 10 mg/dL (ref 8.6–10.2)
Chloride: 103 mmol/L (ref 96–106)
Creatinine, Ser: 1.19 mg/dL (ref 0.76–1.27)
GFR calc Af Amer: 72 mL/min/{1.73_m2} (ref >59)
GFR calc non Af Amer: 62 mL/min/{1.73_m2} (ref >59)
Globulin, Total: 2.7 g/dL (ref 1.5–4.5)
Glucose: 100 mg/dL — ABNORMAL HIGH (ref 65–99)
Potassium: 5.2 mmol/L (ref 3.5–5.2)
Sodium: 140 mmol/L (ref 134–144)
Total Protein: 7.4 g/dL (ref 6.0–8.5)

## 2020-03-10 LAB — CBC WITH DIFFERENTIAL/PLATELET
Basophils Absolute: 0.1 10*3/uL (ref 0.0–0.2)
Basos: 1 %
EOS (ABSOLUTE): 0.1 10*3/uL (ref 0.0–0.4)
Eos: 1 %
Hematocrit: 48.6 % (ref 37.5–51.0)
Hemoglobin: 16.6 g/dL (ref 13.0–17.7)
Immature Grans (Abs): 0 10*3/uL (ref 0.0–0.1)
Immature Granulocytes: 0 %
Lymphocytes Absolute: 3.8 10*3/uL — ABNORMAL HIGH (ref 0.7–3.1)
Lymphs: 36 %
MCH: 31.6 pg (ref 26.6–33.0)
MCHC: 34.2 g/dL (ref 31.5–35.7)
MCV: 92 fL (ref 79–97)
Monocytes Absolute: 0.8 10*3/uL (ref 0.1–0.9)
Monocytes: 8 %
Neutrophils Absolute: 5.9 10*3/uL (ref 1.4–7.0)
Neutrophils: 54 %
Platelets: 204 10*3/uL (ref 150–450)
RBC: 5.26 x10E6/uL (ref 4.14–5.80)
RDW: 12.4 % (ref 11.6–15.4)
WBC: 10.7 10*3/uL (ref 3.4–10.8)

## 2020-03-10 LAB — LIPID PANEL
Chol/HDL Ratio: 4.2 ratio (ref 0.0–5.0)
Cholesterol, Total: 220 mg/dL — ABNORMAL HIGH (ref 100–199)
HDL: 52 mg/dL (ref >39)
LDL Chol Calc (NIH): 151 mg/dL — ABNORMAL HIGH (ref 0–99)
Triglycerides: 98 mg/dL (ref 0–149)
VLDL Cholesterol Cal: 17 mg/dL (ref 5–40)

## 2020-03-10 LAB — TSH: TSH: 2.83 u[IU]/mL (ref 0.450–4.500)

## 2020-03-19 ENCOUNTER — Other Ambulatory Visit: Payer: Self-pay

## 2020-03-19 ENCOUNTER — Ambulatory Visit (INDEPENDENT_AMBULATORY_CARE_PROVIDER_SITE_OTHER): Payer: PPO | Admitting: Pharmacist

## 2020-03-19 DIAGNOSIS — G720 Drug-induced myopathy: Secondary | ICD-10-CM | POA: Diagnosis not present

## 2020-03-19 NOTE — Progress Notes (Signed)
03/20/2020 Name: [name] [name] MRN: [id number] DOB: 15-Apr-1951  HPI:  [name] [name] is a 69 y.o. male patient referred to lipid clinic by PCP. PMH is significant for: Past Medical History:  Diagnosis Date   Allergy    Anxiety    Patient denies   Arthritis    CAD (coronary artery disease)    The EF was 55% with inferior hypokinesis. Cath 09   Carotid stenosis    Coronary artery disease    left main had luminal irregularities, the LAD had 30% stenosis, the first diagonal had moderate-sized with ostial 35% stenosis, the circumflex in the AV groove had ostial 25% stenosis, the first obtuse marginal had long proximal 30% stenosis, the mid obtuse marginal was normal, the right coronary artery was dominant and occluded proximally.  There was excellent left to right collateral filling.      Depression    Patient denies   Diverticulitis    Dyslipidemia    Essential hypertension 08/24/2017   GERD (gastroesophageal reflux disease)    Lung nodule    benign   Osteoporosis    Tobacco abuse     Intolerances: crestor, lipitor, pravastatin, vytorin  Risk Factors:   Smoker-60 pack yr history COPD  LDL goal: <100  Diet: encouraged low fat, low cholesterol diet, increase fiber  Exercise: encouraged  Social history: current smoker--encouraged cessation  Labs:    Lipid Panel     Component Value Date/Time   CHOL 220 (H) 03/09/2020 1033   TRIG 98 03/09/2020 1033   HDL 52 03/09/2020 1033   CHOLHDL 4.2 03/09/2020 1033   CHOLHDL 5.5 03/08/2013 0509   VLDL 22 03/08/2013 0509   LDLCALC 151 (H) 03/09/2020 1033   LABVLDL 17 03/09/2020 1033     Current Outpatient Medications on File Prior to Visit  Medication Sig Dispense Refill   albuterol (VENTOLIN HFA) 108 (90 Base) MCG/ACT inhaler Inhale 2 puffs into the lungs every 4 (four) hours as needed for wheezing or shortness of breath. 18 g 5   aspirin EC 81 MG tablet Take 81 mg by mouth daily.      Cholecalciferol (VITAMIN D-3 PO) Take 1 tablet by mouth daily.      fluticasone (FLONASE) 50 MCG/ACT nasal spray Place 2 sprays into both nostrils daily. (Patient taking differently: Place 2 sprays into both nostrils daily as needed (for allergies.). ) 16 g 0   levothyroxine (SYNTHROID) 50 MCG tablet Take 1 tablet (50 mcg total) by mouth daily. 90 tablet 3   lisinopril (ZESTRIL) 20 MG tablet Take 1 tablet (20 mg total) by mouth daily. 90 tablet 3   Multiple Vitamin (MULTIVITAMIN WITH MINERALS) TABS tablet Take 1 tablet by mouth daily. Centrum Silver     naproxen sodium (ALEVE) 220 MG tablet Take 440 mg by mouth 2 (two) times daily as needed (for pain.).     nitroGLYCERIN (NITROSTAT) 0.4 MG SL tablet Place 1 tablet (0.4 mg total) under the tongue every 5 (five) minutes as needed for chest pain. 25 tablet 3   omeprazole (PRILOSEC) 20 MG capsule Take 1 capsule (20 mg total) by mouth daily. 90 capsule 3   SUMAtriptan (IMITREX) 50 MG tablet Take 1 tablet (50 mg total) by mouth every 2 (two) hours as needed for headache. May repeat in 2 hours if headache persists or recurs. Limit 2 per 24 hours 20 tablet 0   tiotropium (SPIRIVA HANDIHALER) 18 MCG inhalation capsule Place 1 capsule (18 mcg total) into inhaler and  inhale daily. 30 capsule 12   No current facility-administered medications on file prior to visit.     Allergies  Allergen Reactions   Crestor [Rosuvastatin Calcium] Other (See Comments)    Lethargy and muscle aches   Flomax [Tamsulosin Hcl] Other (See Comments)    Made patient hypotensive, clammy, sweaty, and feel like he was going to pass out (EMS HAD TO BE CALLED)   Lipitor [Atorvastatin Calcium] Other (See Comments)    Body cramps, lethargy and muscle aches    Assessment/Plan:    1. Hyperlipidemia -  Patient has tried and failed the following statins:  crestor, lipitor, pravastatin, vytorin.  He has tried alternatively taking statins every other day as well.   Will  trial patient on Nexlizet 180/10mg  once daily  Samples given to test tolerability [id number], exp 2/22, #21   Encouraged patient to call to let us know how he is doing on this medication.  Will then attempt to get covered on insurance  Follow up with PCP On 09/03/20 Follow up with PharmD in 2-3 weeks telephonically   Regina Eck, PharmD, BCPS Clinical Pharmacist, [place]  [facility] Phone [phone]

## 2020-03-20 ENCOUNTER — Encounter: Payer: Self-pay | Admitting: Pharmacist

## 2020-04-02 ENCOUNTER — Telehealth: Payer: Self-pay | Admitting: Pharmacist

## 2020-04-09 ENCOUNTER — Telehealth: Payer: Self-pay

## 2020-04-09 MED ORDER — NEXLIZET 180-10 MG PO TABS: 1.0000 | ORAL_TABLET | Freq: Every day | ORAL | 3 refills | Status: DC

## 2020-04-09 NOTE — Telephone Encounter (Signed)
Patient tolerating Nexlizet well Will send RX to determine insurance coverage Samples left up front for patient to continue therapy Patient verbalizes understanding

## 2020-04-10 ENCOUNTER — Telehealth: Payer: Self-pay | Admitting: *Deleted

## 2020-04-10 NOTE — Telephone Encounter (Signed)
Key: [id number] - PA Case ID: [id number]  Nexlizet 180-10MG  tablets  Form [employer] 4-Part Electronic PA Form [phone] NCPDP)  Sent to Plan today

## 2020-04-12 ENCOUNTER — Telehealth: Payer: Self-pay

## 2020-04-12 MED ORDER — EZETIMIBE 10 MG PO TABS: 10.0000 mg | ORAL_TABLET | Freq: Every day | ORAL | 3 refills | Status: DC

## 2020-04-12 NOTE — Telephone Encounter (Signed)
Okay thanks Julie 

## 2020-04-12 NOTE — Telephone Encounter (Signed)
Unfortunately grants/funds/programs are closed for all brand name hyperlipidemia medications until January 2022.  I have no way of getting this medication until then.  I will call in generic zetia to see if this is covered.  This medication is included in his current combination pill (nexlizet).  Hopefully copay will be cheaper?  Please let patient know we are working on this.

## 2020-04-12 NOTE — Telephone Encounter (Signed)
Nexlizet 180-10 tab  Was covered - but copay is $90 / mo   Is there anything different he can try?

## 2020-04-12 NOTE — Telephone Encounter (Signed)
Patient aware and verbalizes understanding. 

## 2020-04-12 NOTE — Telephone Encounter (Signed)
Lmtcb.

## 2020-04-12 NOTE — Telephone Encounter (Signed)
Approved on October 20 PA Case: [id number], Status: Approved, Coverage Starts on: 04/11/2020 12:00:00 AM, Coverage Ends on: 06/22/2021 12:00:00 A  [facility] aware

## 2020-04-12 NOTE — Telephone Encounter (Signed)
Unfortunately after statins there are only more expensive medicines that are brand-name like this, we set him up with [name] [name] for prescription assistance through the pharmaceutical program.

## 2020-04-12 NOTE — Telephone Encounter (Signed)
Refer to previous call.

## 2020-04-12 NOTE — Addendum Note (Signed)
Addended by: Lottie Dawson D on: 04/12/2020 02:56 PM   Modules accepted: Orders

## 2020-04-12 NOTE — Telephone Encounter (Signed)
Patient states he will make appointment.  What does patient need to bring to appointment ?

## 2020-07-23 ENCOUNTER — Ambulatory Visit (INDEPENDENT_AMBULATORY_CARE_PROVIDER_SITE_OTHER): Payer: PPO | Admitting: Family Medicine

## 2020-07-23 ENCOUNTER — Encounter: Payer: Self-pay | Admitting: Family Medicine

## 2020-07-23 ENCOUNTER — Other Ambulatory Visit: Payer: Self-pay

## 2020-07-23 VITALS — BP 111/63 | HR 82 | Temp 97.3°F | Ht 70.0 in | Wt 149.5 lb

## 2020-07-23 DIAGNOSIS — K5792 Diverticulitis of intestine, part unspecified, without perforation or abscess without bleeding: Secondary | ICD-10-CM

## 2020-07-23 MED ORDER — AMOXICILLIN-POT CLAVULANATE 875-125 MG PO TABS: 1.0000 | ORAL_TABLET | Freq: Three times a day (TID) | ORAL | 0 refills | Status: AC

## 2020-07-23 NOTE — Progress Notes (Signed)
Established Patient Office Visit  Subjective:  Patient ID: [name] [name], male    DOB: [birth year]  Age: 70 y.o. MRN: [id number]  CC:  Chief Complaint  Patient presents with  . Bloated    HPI [name] LemaJeffrey T [name] presents for abdominal bloating x5 days. He also reports mild pain in his LLQ x 5 days and intermittent nausea. He did have a temperature of around 100 on Friday, but denies fever since. Denies vomiting or diarrhea. He is able to keep down fluids. He has a history of diverticulosis and this is a typical presentation for his flares. He has not had a flare recently. He has not tried anything for his symptoms.   Past Medical History:  Diagnosis Date  . Allergy   . Anxiety    Patient denies  . Arthritis   . CAD (coronary artery disease)    The EF was 55% with inferior hypokinesis. Cath 09  . Carotid stenosis   . Coronary artery disease    left main had luminal irregularities, the LAD had 30% stenosis, the first diagonal had moderate-sized with ostial 35% stenosis, the circumflex in the AV groove had ostial 25% stenosis, the first obtuse marginal had long proximal 30% stenosis, the mid obtuse marginal was normal, the right coronary artery was dominant and occluded proximally.  There was excellent left to right collateral filling.     . Depression    Patient denies  . Diverticulitis   . Dyslipidemia   . Essential hypertension 08/24/2017  . GERD (gastroesophageal reflux disease)   . Lung nodule    benign  . Osteoporosis   . Tobacco abuse     Past Surgical History:  Procedure Laterality Date  . CAROTID ENDARTERECTOMY Right   . COLONOSCOPY    . LEFT HEART CATH AND CORONARY ANGIOGRAPHY N/A 08/04/2017   Procedure: LEFT HEART CATH AND CORONARY ANGIOGRAPHY;  Surgeon: Kathleene HazelMcAlhany, Christopher D, MD;  Location: [facility];  Service: Cardiovascular;  Laterality: N/A;  . left inguinal herniorrhaphy    . THROAT SURGERY      Family History  Problem Relation Age of Onset   . Lung disease Father   . Lupus Mother   . Lupus Brother   . Alcohol abuse Brother   . Lung disease Brother   . Cancer Brother        colon  . Lung disease Brother   . Healthy Daughter   . Healthy Son   . Healthy Son   . Healthy Son   . Esophageal cancer Neg Hx   . Rectal cancer Neg Hx   . Stomach cancer Neg Hx   . Pancreatic cancer Neg Hx     Social History   Socioeconomic History  . Marital status: Married    Spouse name: [name]  . Number of children: 4  . Years of education: high school diploma  . Highest education level: 12th grade  Occupational History  . Occupation: truck Air traffic controllerdriver    Employer: [employer]  Tobacco Use  . Smoking status: Current Every Day Smoker    Packs/day: 1.50    Years: 40.00    Pack years: 60.00    Types: Cigarettes  . Smokeless tobacco: Never Used  Vaping Use  . Vaping Use: Never used  Substance and Sexual Activity  . Alcohol use: Yes    Alcohol/week: 1.0 standard drink    Types: 1 Shots of liquor per week  . Drug use: No  . Sexual activity: Yes  Birth control/protection: None  Other Topics Concern  . Not on file  Social History Narrative   Lives with wife in a split level home.    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   Has 1 daughter, 3 sons, and many grandchildren.   Social Determinants of Health   Financial Resource Strain: Not on file  Food Insecurity: Not on file  Transportation Needs: Not on file  Physical Activity: Not on file  Stress: Not on file  Social Connections: Moderately Integrated  . Frequency of Communication with Friends and Family: More than three times a week  . Frequency of Social Gatherings with Friends and Family: More than three times a week  . Attends Religious Services: More than 4 times per year  . Active Member of Clubs or Organizations: No  . Attends Archivist Meetings: Never  . Marital Status: Married  Human resources officer Violence: Not on file    Outpatient  Medications Prior to Visit  Medication Sig Dispense Refill  . Cholecalciferol (VITAMIN D-3 PO) Take 1 tablet by mouth daily.    Marland Kitchen ezetimibe (ZETIA) 10 MG tablet Take 1 tablet (10 mg total) by mouth daily. 90 tablet 3  . fluticasone (FLONASE) 50 MCG/ACT nasal spray Place 2 sprays into both nostrils daily. (Patient taking differently: Place 2 sprays into both nostrils daily as needed (for allergies.).) 16 g 0  . levothyroxine (SYNTHROID) 50 MCG tablet Take 1 tablet (50 mcg total) by mouth daily. 90 tablet 3  . lisinopril (ZESTRIL) 20 MG tablet Take 1 tablet (20 mg total) by mouth daily. 90 tablet 3  . Multiple Vitamin (MULTIVITAMIN WITH MINERALS) TABS tablet Take 1 tablet by mouth daily. Centrum Silver    . naproxen sodium (ALEVE) 220 MG tablet Take 440 mg by mouth 2 (two) times daily as needed (for pain.).    Marland Kitchen omeprazole (PRILOSEC) 20 MG capsule Take 1 capsule (20 mg total) by mouth daily. 90 capsule 3  . albuterol (VENTOLIN HFA) 108 (90 Base) MCG/ACT inhaler Inhale 2 puffs into the lungs every 4 (four) hours as needed for wheezing or shortness of breath. (Patient not taking: Reported on 07/23/2020) 18 g 5  . Bempedoic Acid-Ezetimibe (NEXLIZET) 180-10 MG TABS Take 1 tablet by mouth daily. 30 tablet 3  . nitroGLYCERIN (NITROSTAT) 0.4 MG SL tablet Place 1 tablet (0.4 mg total) under the tongue every 5 (five) minutes as needed for chest pain. 25 tablet 3  . tiotropium (SPIRIVA HANDIHALER) 18 MCG inhalation capsule Place 1 capsule (18 mcg total) into inhaler and inhale daily. (Patient not taking: Reported on 07/23/2020) 30 capsule 12  . aspirin EC 81 MG tablet Take 81 mg by mouth daily.    . SUMAtriptan (IMITREX) 50 MG tablet Take 1 tablet (50 mg total) by mouth every 2 (two) hours as needed for headache. May repeat in 2 hours if headache persists or recurs. Limit 2 per 24 hours 20 tablet 0   No facility-administered medications prior to visit.    Allergies  Allergen Reactions  . Crestor  [Rosuvastatin Calcium] Other (See Comments)    Lethargy and muscle aches  . Flomax [Tamsulosin Hcl] Other (See Comments)    Made patient hypotensive, clammy, sweaty, and feel like he was going to pass out (EMS HAD TO BE CALLED)  . Lipitor [Atorvastatin Calcium] Other (See Comments)    Body cramps, lethargy and muscle aches    ROS Review of Systems As per HPI.    Objective:  Physical Exam Vitals and nursing note reviewed.  Constitutional:      General: He is not in acute distress.    Appearance: Normal appearance. He is normal weight. He is not ill-appearing, toxic-appearing or diaphoretic.  Eyes:     General: No scleral icterus.    Conjunctiva/sclera: Conjunctivae normal.     Pupils: Pupils are equal, round, and reactive to light.  Cardiovascular:     Rate and Rhythm: Normal rate and regular rhythm.     Heart sounds: Normal heart sounds. No murmur heard.   Pulmonary:     Effort: Pulmonary effort is normal. No respiratory distress.     Breath sounds: Normal breath sounds.  Abdominal:     General: Bowel sounds are normal. There is distension.     Palpations: There is no mass.     Tenderness: There is no abdominal tenderness. There is no right CVA tenderness, left CVA tenderness, guarding or rebound.     Hernia: No hernia is present.  Musculoskeletal:     Right lower leg: No edema.     Left lower leg: No edema.  Skin:    General: Skin is warm.     Capillary Refill: Capillary refill takes less than 2 seconds.     Coloration: Skin is not jaundiced.  Neurological:     Mental Status: He is alert and oriented to person, place, and time.  Psychiatric:        Mood and Affect: Mood normal.     BP 111/63   Pulse 82   Temp (!) 97.3 F (36.3 C) (Temporal)   Ht 5\' 10"  (1.778 m)   Wt 149 lb 8 oz (67.8 kg)   BMI 21.45 kg/m  Wt Readings from Last 3 Encounters:  07/23/20 149 lb 8 oz (67.8 kg)  03/06/20 147 lb (66.7 kg)  09/11/19 145 lb (65.8 kg)     There are no  preventive care reminders to display for this patient.  There are no preventive care reminders to display for this patient.  Lab Results  Component Value Date   TSH 2.830 03/09/2020   Lab Results  Component Value Date   WBC 10.7 03/09/2020   HGB 16.6 03/09/2020   HCT 48.6 03/09/2020   MCV 92 03/09/2020   PLT 204 03/09/2020   Lab Results  Component Value Date   NA 140 03/09/2020   K 5.2 03/09/2020   CO2 23 03/09/2020   GLUCOSE 100 (H) 03/09/2020   BUN 16 03/09/2020   CREATININE 1.19 03/09/2020   BILITOT 0.5 03/09/2020   ALKPHOS 125 (H) 03/09/2020   AST 20 03/09/2020   ALT 12 03/09/2020   PROT 7.4 03/09/2020   ALBUMIN 4.7 03/09/2020   CALCIUM 10.0 03/09/2020   ANIONGAP 10 09/11/2019   Lab Results  Component Value Date   CHOL 220 (H) 03/09/2020   Lab Results  Component Value Date   HDL 52 03/09/2020   Lab Results  Component Value Date   LDLCALC 151 (H) 03/09/2020   Lab Results  Component Value Date   TRIG 98 03/09/2020   Lab Results  Component Value Date   CHOLHDL 4.2 03/09/2020   Lab Results  Component Value Date   HGBA1C 5.9 (H) 07/13/2017      Assessment & Plan:  [name] [name] was seen today for bloated.  Diagnoses and all orders for this visit:  Diverticulitis Symptoms consistent with previous diverticulitis episodes for this patient in the past. Distension on exam, no tenderness. Augmentin ordered. Return  to office for new or worsening symptoms, or if symptoms persist.  -     amoxicillin-clavulanate (AUGMENTIN) 875-125 MG tablet; Take 1 tablet by mouth 3 (three) times daily for 7 days.   Follow-up: Return if symptoms worsen or fail to improve.   The patient indicates understanding of these issues and agrees with the plan.    Gwenlyn Perking, FNP

## 2020-07-23 NOTE — Patient Instructions (Signed)
Diverticulitis  Diverticulitis is infection or inflammation of small pouches (diverticula) in the colon that form due to a condition called diverticulosis. Diverticula can trap stool (feces) and bacteria, causing infection and inflammation. Diverticulitis may cause severe stomach pain and diarrhea. It may lead to tissue damage in the colon that causes bleeding or blockage. The diverticula may also burst (rupture) and cause infected stool to enter other areas of the abdomen. What are the causes? This condition is caused by stool becoming trapped in the diverticula, which allows bacteria to grow in the diverticula. This leads to inflammation and infection. What increases the risk? You are more likely to develop this condition if you have diverticulosis. The risk increases if you:  Are overweight or obese.  Do not get enough exercise.  Drink alcohol.  Use tobacco products.  Eat a diet that has a lot of red meat such as beef, pork, or lamb.  Eat a diet that does not include enough fiber. High-fiber foods include fruits, vegetables, beans, nuts, and whole grains.  Are over 40 years of age. What are the signs or symptoms? Symptoms of this condition may include:  Pain and tenderness in the abdomen. The pain is normally located on the left side of the abdomen, but it may occur in other areas.  Fever and chills.  Nausea.  Vomiting.  Cramping.  Bloating.  Changes in bowel routines.  Blood in your stool. How is this diagnosed? This condition is diagnosed based on:  Your medical history.  A physical exam.  Tests to make sure there is nothing else causing your condition. These tests may include: ? Blood tests. ? Urine tests. ? CT scan of the abdomen. How is this treated? Most cases of this condition are mild and can be treated at home. Treatment may include:  Taking over-the-counter pain medicines.  Following a clear liquid diet.  Taking antibiotic medicines by  mouth.  Resting. More severe cases may need to be treated at a hospital. Treatment may include:  Not eating or drinking.  Taking prescription pain medicine.  Receiving antibiotic medicines through an IV.  Receiving fluids and nutrition through an IV.  Surgery. When your condition is under control, your health care provider may recommend that you have a colonoscopy. This is an exam to look at the entire large intestine. During the exam, a lubricated, bendable tube is inserted into the anus and then passed into the rectum, colon, and other parts of the large intestine. A colonoscopy can show how severe your diverticula are and whether something else may be causing your symptoms. Follow these instructions at home: Medicines  Take over-the-counter and prescription medicines only as told by your health care provider. These include fiber supplements, probiotics, and stool softeners.  If you were prescribed an antibiotic medicine, take it as told by your health care provider. Do not stop taking the antibiotic even if you start to feel better.  Ask your health care provider if the medicine prescribed to you requires you to avoid driving or using machinery. Eating and drinking  Follow a full liquid diet or another diet as directed by your health care provider.  After your symptoms improve, your health care provider may tell you to change your diet. He or she may recommend that you eat a diet that contains at least 25 grams (25 g) of fiber daily. Fiber makes it easier to pass stool. Healthy sources of fiber include: ? Berries. One cup contains 4-8 grams of fiber. ? Beans   or lentils. One-half cup contains 5-8 grams of fiber. ? Green vegetables. One cup contains 4 grams of fiber.  Avoid eating red meat.   General instructions  Do not use any products that contain nicotine or tobacco, such as cigarettes, e-cigarettes, and chewing tobacco. If you need help quitting, ask your health care  provider.  Exercise for at least 30 minutes, 3 times each week. You should exercise hard enough to raise your heart rate and break a sweat.  Keep all follow-up visits as told by your health care provider. This is important. You may need to have a colonoscopy. Contact a health care provider if:  Your pain does not improve.  Your bowel movements do not return to normal. Get help right away if:  Your pain gets worse.  Your symptoms do not get better with treatment.  Your symptoms suddenly get worse.  You have a fever.  You vomit more than one time.  You have stools that are bloody, black, or tarry. Summary  Diverticulitis is infection or inflammation of small pouches (diverticula) in the colon that form due to a condition called diverticulosis. Diverticula can trap stool (feces) and bacteria, causing infection and inflammation.  You are at higher risk for this condition if you have diverticulosis and you eat a diet that does not include enough fiber.  Most cases of this condition are mild and can be treated at home. More severe cases may need to be treated at a hospital.  When your condition is under control, your health care provider may recommend that you have an exam called a colonoscopy. This exam can show how severe your diverticula are and whether something else may be causing your symptoms.  Keep all follow-up visits as told by your health care provider. This is important. This information is not intended to replace advice given to you by your health care provider. Make sure you discuss any questions you have with your health care provider. Document Revised: 03/21/2019 Document Reviewed: 03/21/2019 Elsevier Patient Education  2021 [employer]  

## 2020-07-30 IMAGING — DX LEFT ANKLE COMPLETE - 3+ VIEW
3 series · 3 of 3 positions shown · non-contrast
Comparison: None.

CLINICAL DATA: Acute left ankle pain after twisting injury.

EXAM:
LEFT ANKLE COMPLETE - 3+ VIEW

[ankle ap]
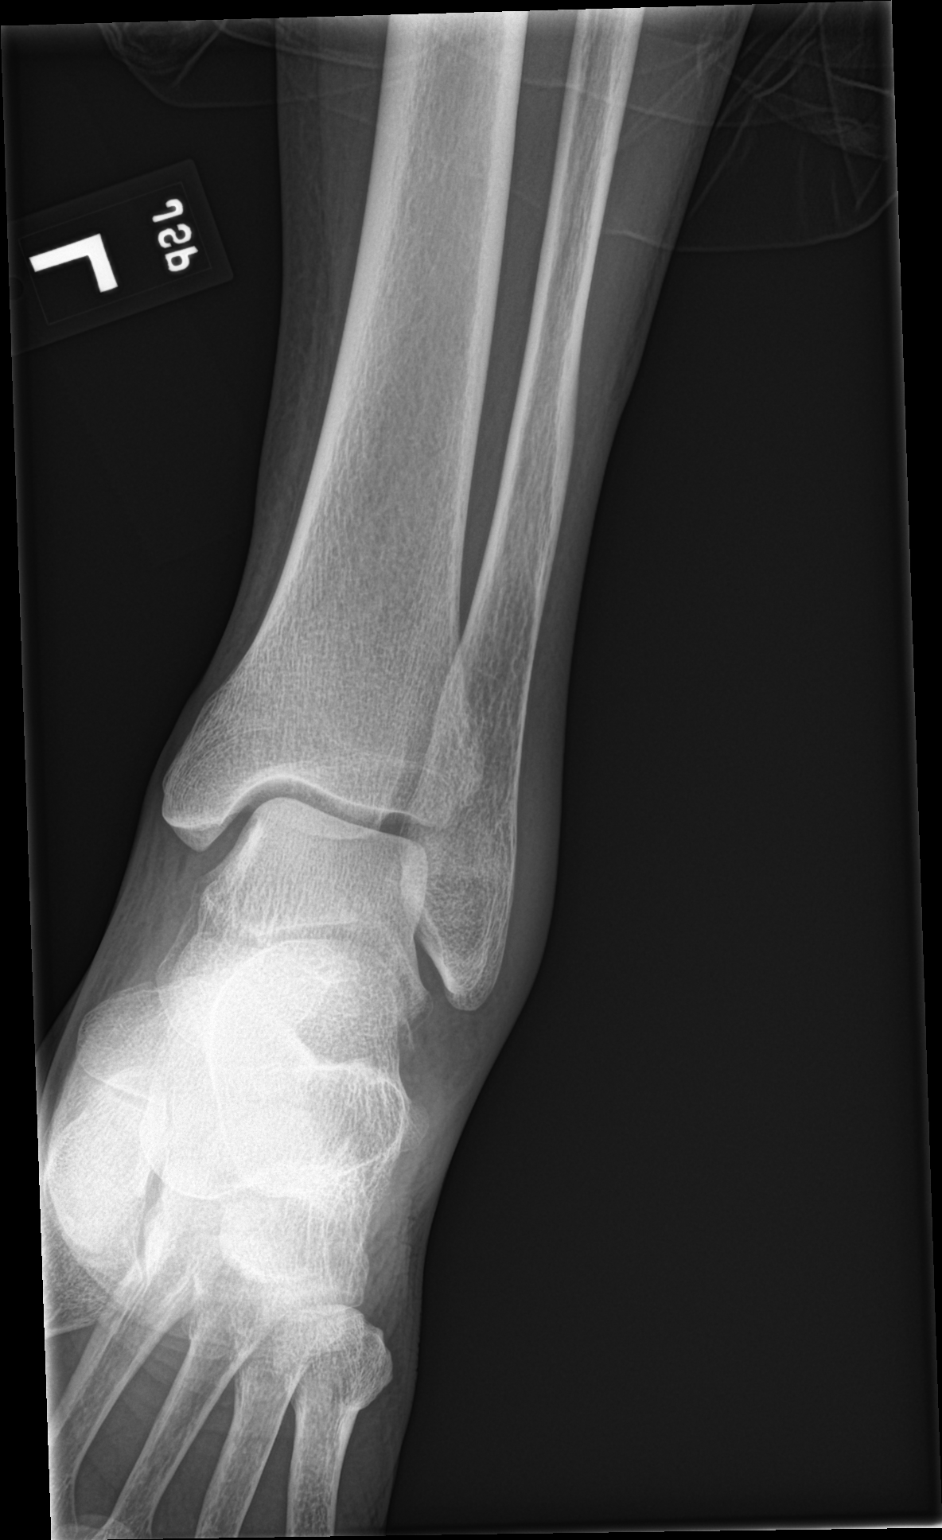

[ankle obl]
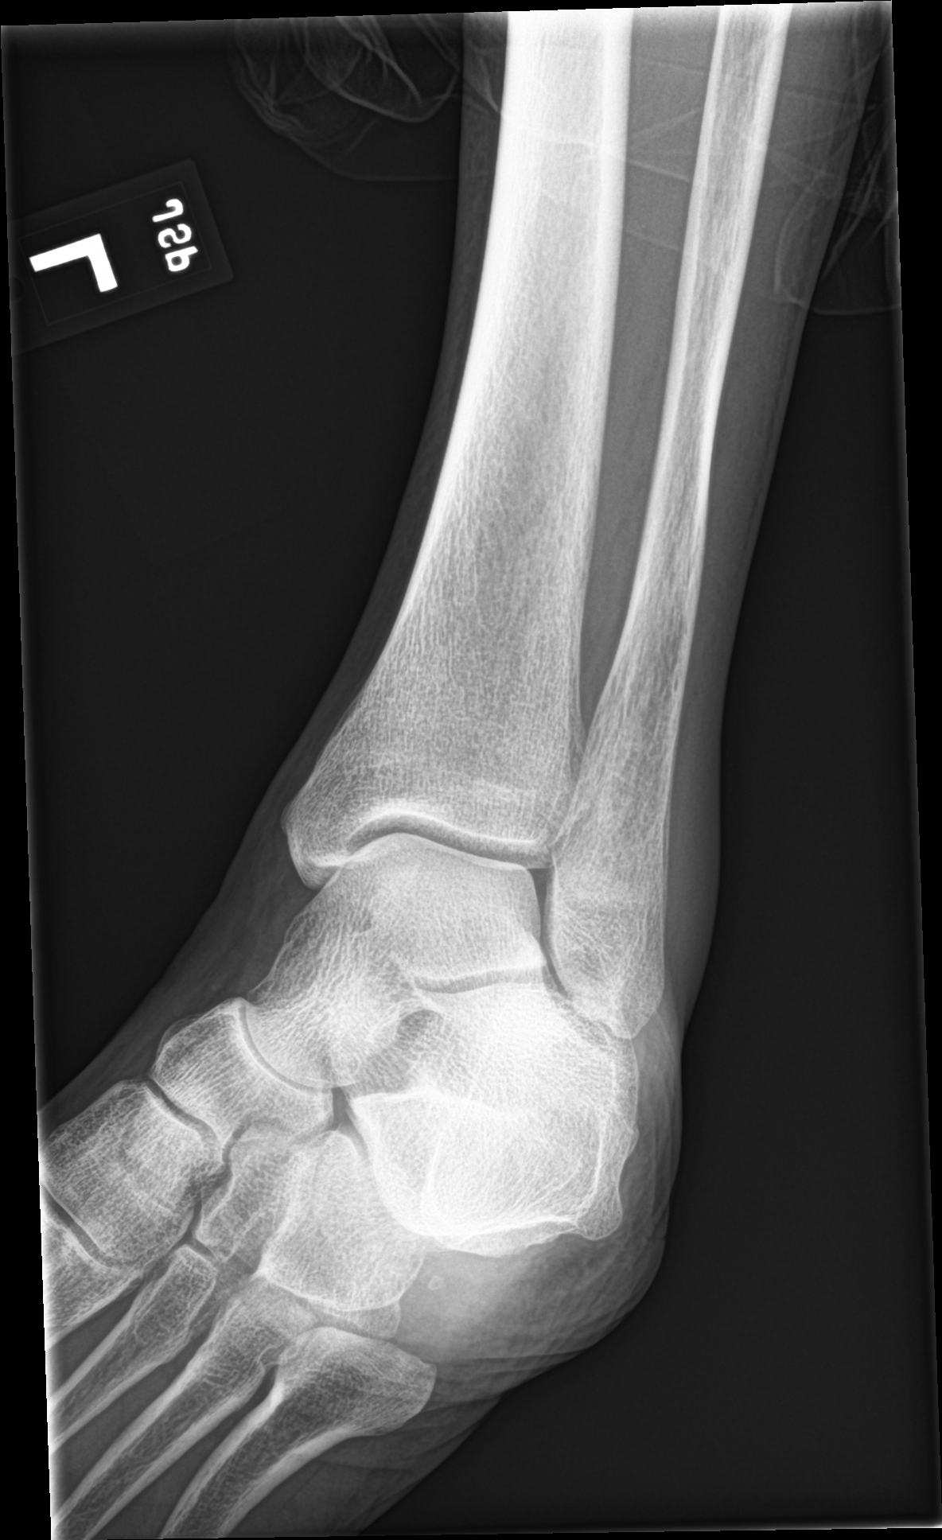

[ankle lat]
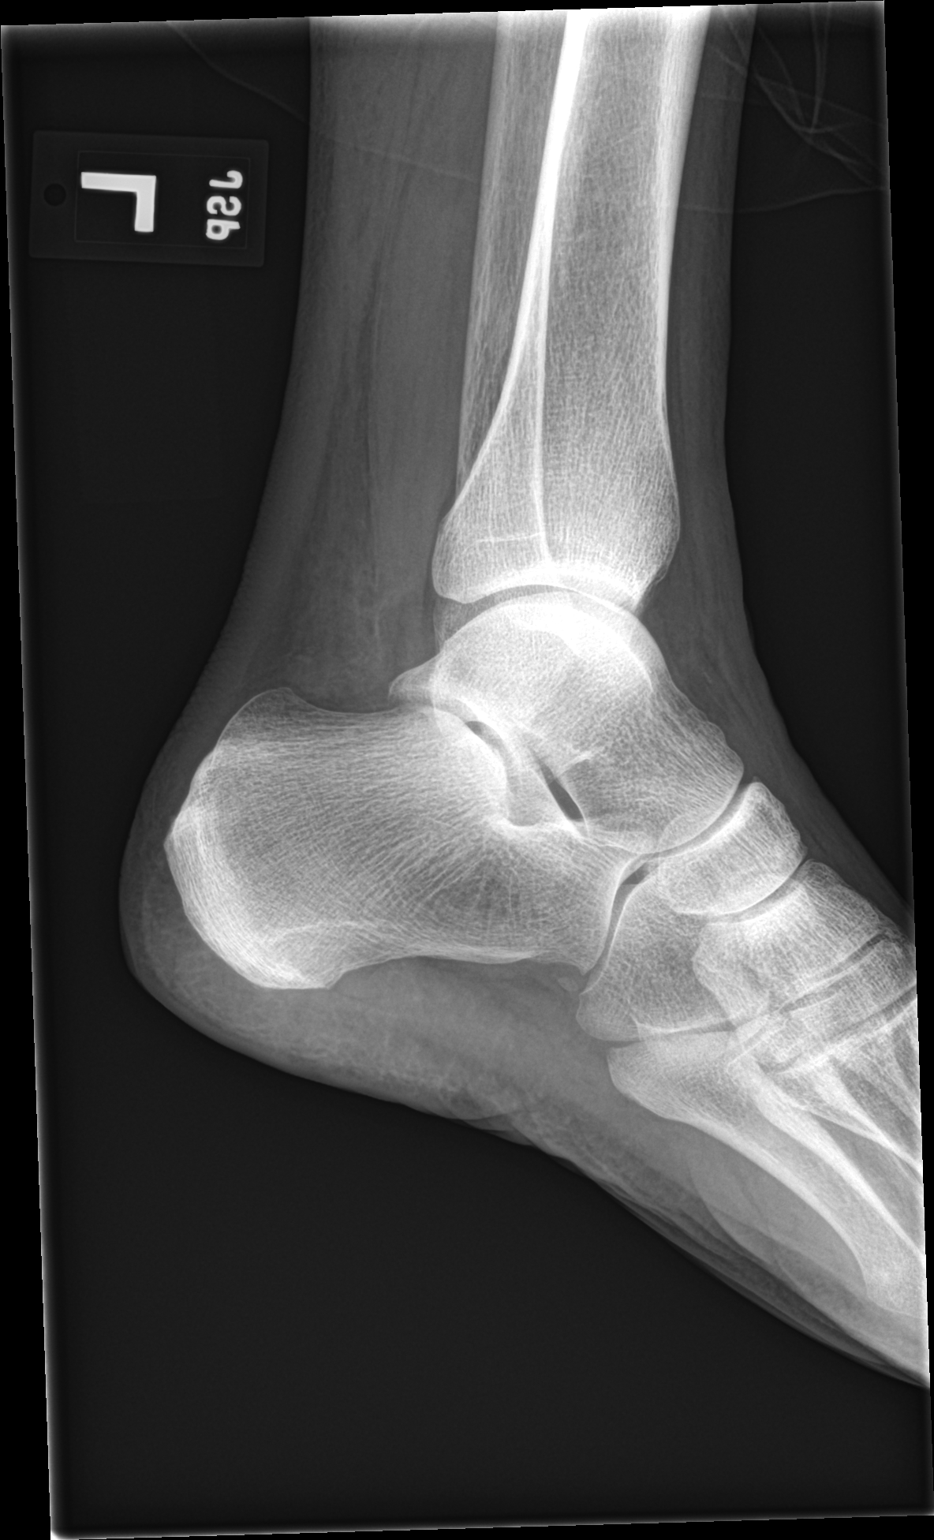

[3 of 3 positions shown; findings below may reference images not displayed]

FINDINGS: There is no evidence of fracture, dislocation, or joint effusion.
There is no evidence of arthropathy or other focal bone abnormality.
Soft tissue swelling is seen overlying lateral malleolus.
IMPRESSION: No fracture or dislocation is noted. Soft tissue swelling is seen
overlying lateral malleolus.

## 2020-08-01 ENCOUNTER — Ambulatory Visit (INDEPENDENT_AMBULATORY_CARE_PROVIDER_SITE_OTHER): Payer: PPO | Admitting: Family Medicine

## 2020-08-01 ENCOUNTER — Encounter: Payer: Self-pay | Admitting: Family Medicine

## 2020-08-01 ENCOUNTER — Other Ambulatory Visit: Payer: Self-pay

## 2020-08-01 VITALS — BP 108/66 | HR 100 | Temp 98.0°F | Ht 70.0 in | Wt 146.2 lb

## 2020-08-01 DIAGNOSIS — K5792 Diverticulitis of intestine, part unspecified, without perforation or abscess without bleeding: Secondary | ICD-10-CM | POA: Diagnosis not present

## 2020-08-01 MED ORDER — PREDNISONE 10 MG PO TABS: 10.0000 mg | ORAL_TABLET | Freq: Every day | ORAL | 0 refills | Status: DC

## 2020-08-01 MED ORDER — METRONIDAZOLE 500 MG PO TABS: 500.0000 mg | ORAL_TABLET | Freq: Three times a day (TID) | ORAL | 0 refills | Status: AC

## 2020-08-01 MED ORDER — CIPROFLOXACIN HCL 500 MG PO TABS: 500.0000 mg | ORAL_TABLET | Freq: Two times a day (BID) | ORAL | 0 refills | Status: AC

## 2020-08-01 NOTE — Patient Instructions (Signed)
Diverticulitis  Diverticulitis is infection or inflammation of small pouches (diverticula) in the colon that form due to a condition called diverticulosis. Diverticula can trap stool (feces) and bacteria, causing infection and inflammation. Diverticulitis may cause severe stomach pain and diarrhea. It may lead to tissue damage in the colon that causes bleeding or blockage. The diverticula may also burst (rupture) and cause infected stool to enter other areas of the abdomen. What are the causes? This condition is caused by stool becoming trapped in the diverticula, which allows bacteria to grow in the diverticula. This leads to inflammation and infection. What increases the risk? You are more likely to develop this condition if you have diverticulosis. The risk increases if you:  Are overweight or obese.  Do not get enough exercise.  Drink alcohol.  Use tobacco products.  Eat a diet that has a lot of red meat such as beef, pork, or lamb.  Eat a diet that does not include enough fiber. High-fiber foods include fruits, vegetables, beans, nuts, and whole grains.  Are over 40 years of age. What are the signs or symptoms? Symptoms of this condition may include:  Pain and tenderness in the abdomen. The pain is normally located on the left side of the abdomen, but it may occur in other areas.  Fever and chills.  Nausea.  Vomiting.  Cramping.  Bloating.  Changes in bowel routines.  Blood in your stool. How is this diagnosed? This condition is diagnosed based on:  Your medical history.  A physical exam.  Tests to make sure there is nothing else causing your condition. These tests may include: ? Blood tests. ? Urine tests. ? CT scan of the abdomen. How is this treated? Most cases of this condition are mild and can be treated at home. Treatment may include:  Taking over-the-counter pain medicines.  Following a clear liquid diet.  Taking antibiotic medicines by  mouth.  Resting. More severe cases may need to be treated at a hospital. Treatment may include:  Not eating or drinking.  Taking prescription pain medicine.  Receiving antibiotic medicines through an IV.  Receiving fluids and nutrition through an IV.  Surgery. When your condition is under control, your health care provider may recommend that you have a colonoscopy. This is an exam to look at the entire large intestine. During the exam, a lubricated, bendable tube is inserted into the anus and then passed into the rectum, colon, and other parts of the large intestine. A colonoscopy can show how severe your diverticula are and whether something else may be causing your symptoms. Follow these instructions at home: Medicines  Take over-the-counter and prescription medicines only as told by your health care provider. These include fiber supplements, probiotics, and stool softeners.  If you were prescribed an antibiotic medicine, take it as told by your health care provider. Do not stop taking the antibiotic even if you start to feel better.  Ask your health care provider if the medicine prescribed to you requires you to avoid driving or using machinery. Eating and drinking  Follow a full liquid diet or another diet as directed by your health care provider.  After your symptoms improve, your health care provider may tell you to change your diet. He or she may recommend that you eat a diet that contains at least 25 grams (25 g) of fiber daily. Fiber makes it easier to pass stool. Healthy sources of fiber include: ? Berries. One cup contains 4-8 grams of fiber. ? Beans   or lentils. One-half cup contains 5-8 grams of fiber. ? Green vegetables. One cup contains 4 grams of fiber.  Avoid eating red meat.   General instructions  Do not use any products that contain nicotine or tobacco, such as cigarettes, e-cigarettes, and chewing tobacco. If you need help quitting, ask your health care  provider.  Exercise for at least 30 minutes, 3 times each week. You should exercise hard enough to raise your heart rate and break a sweat.  Keep all follow-up visits as told by your health care provider. This is important. You may need to have a colonoscopy. Contact a health care provider if:  Your pain does not improve.  Your bowel movements do not return to normal. Get help right away if:  Your pain gets worse.  Your symptoms do not get better with treatment.  Your symptoms suddenly get worse.  You have a fever.  You vomit more than one time.  You have stools that are bloody, black, or tarry. Summary  Diverticulitis is infection or inflammation of small pouches (diverticula) in the colon that form due to a condition called diverticulosis. Diverticula can trap stool (feces) and bacteria, causing infection and inflammation.  You are at higher risk for this condition if you have diverticulosis and you eat a diet that does not include enough fiber.  Most cases of this condition are mild and can be treated at home. More severe cases may need to be treated at a hospital.  When your condition is under control, your health care provider may recommend that you have an exam called a colonoscopy. This exam can show how severe your diverticula are and whether something else may be causing your symptoms.  Keep all follow-up visits as told by your health care provider. This is important. This information is not intended to replace advice given to you by your health care provider. Make sure you discuss any questions you have with your health care provider. Document Revised: 03/21/2019 Document Reviewed: 03/21/2019 Elsevier Patient Education  2021 [employer]  

## 2020-08-01 NOTE — Progress Notes (Signed)
Established Patient Office Visit  Subjective:  Patient ID: [name] [name], male    DOB: 24-Jan-1951  Age: 70 y.o. MRN: [id number]  CC:  Chief Complaint  Patient presents with  . Diverticulitis  . Cough    Chest congestion     HPI [name] [name] presents for abdominal pain in his LLQ for 2 weeks. The pain is a 5/10. It is a achy, full feeling. He reports diarrhea for the last few days. He denies nausea of vomiting. Denies fever. He was treated with augmentin at his visit 1 week ago. He completed the antibiotic course. He reports some improvement in symptoms but not a resolution. This does feel like a typical flare for him. He denies dizziness, weakness, or bleeding. He reports that prednisone has been helpful in the past with his flares.    Past Medical History:  Diagnosis Date  . Allergy   . Anxiety    Patient denies  . Arthritis   . CAD (coronary artery disease)    The EF was 55% with inferior hypokinesis. Cath 09  . Carotid stenosis   . Coronary artery disease    left main had luminal irregularities, the LAD had 30% stenosis, the first diagonal had moderate-sized with ostial 35% stenosis, the circumflex in the AV groove had ostial 25% stenosis, the first obtuse marginal had long proximal 30% stenosis, the mid obtuse marginal was normal, the right coronary artery was dominant and occluded proximally.  There was excellent left to right collateral filling.     . Depression    Patient denies  . Diverticulitis   . Dyslipidemia   . Essential hypertension 08/24/2017  . GERD (gastroesophageal reflux disease)   . Lung nodule    benign  . Osteoporosis   . Tobacco abuse     Past Surgical History:  Procedure Laterality Date  . CAROTID ENDARTERECTOMY Right   . COLONOSCOPY    . LEFT HEART CATH AND CORONARY ANGIOGRAPHY N/A 08/04/2017   Procedure: LEFT HEART CATH AND CORONARY ANGIOGRAPHY;  Surgeon: Burnell Blanks, MD;  Location: [facility];  Service:  Cardiovascular;  Laterality: N/A;  . left inguinal herniorrhaphy    . THROAT SURGERY      Family History  Problem Relation Age of Onset  . Lung disease Father   . Lupus Mother   . Lupus Brother   . Alcohol abuse Brother   . Lung disease Brother   . Cancer Brother        colon  . Lung disease Brother   . Healthy Daughter   . Healthy Son   . Healthy Son   . Healthy Son   . Esophageal cancer Neg Hx   . Rectal cancer Neg Hx   . Stomach cancer Neg Hx   . Pancreatic cancer Neg Hx     Social History   Socioeconomic History  . Marital status: Married    Spouse name: [name]  . Number of children: 4  . Years of education: high school diploma  . Highest education level: 12th grade  Occupational History  . Occupation: truck Education administrator: [employer]  Tobacco Use  . Smoking status: Current Every Day Smoker    Packs/day: 1.50    Years: 40.00    Pack years: 60.00    Types: Cigarettes  . Smokeless tobacco: Never Used  Vaping Use  . Vaping Use: Never used  Substance and Sexual Activity  . Alcohol use: Yes  Alcohol/week: 1.0 standard drink    Types: 1 Shots of liquor per week  . Drug use: No  . Sexual activity: Yes    Birth control/protection: None  Other Topics Concern  . Not on file  Social History Narrative   Lives with wife in a split level home.    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   Has 1 daughter, 3 sons, and many grandchildren.   Social Determinants of Health   Financial Resource Strain: Not on file  Food Insecurity: Not on file  Transportation Needs: Not on file  Physical Activity: Not on file  Stress: Not on file  Social Connections: Moderately Integrated  . Frequency of Communication with Friends and Family: More than three times a week  . Frequency of Social Gatherings with Friends and Family: More than three times a week  . Attends Religious Services: More than 4 times per year  . Active Member of Clubs or Organizations: No   . Attends Archivist Meetings: Never  . Marital Status: Married  Human resources officer Violence: Not on file    Outpatient Medications Prior to Visit  Medication Sig Dispense Refill  . albuterol (VENTOLIN HFA) 108 (90 Base) MCG/ACT inhaler Inhale 2 puffs into the lungs every 4 (four) hours as needed for wheezing or shortness of breath. 18 g 5  . Bempedoic Acid-Ezetimibe (NEXLIZET) 180-10 MG TABS Take 1 tablet by mouth daily. 30 tablet 3  . Cholecalciferol (VITAMIN D-3 PO) Take 1 tablet by mouth daily.    Marland Kitchen ezetimibe (ZETIA) 10 MG tablet Take 1 tablet (10 mg total) by mouth daily. 90 tablet 3  . fluticasone (FLONASE) 50 MCG/ACT nasal spray Place 2 sprays into both nostrils daily. (Patient taking differently: Place 2 sprays into both nostrils daily as needed (for allergies.).) 16 g 0  . levothyroxine (SYNTHROID) 50 MCG tablet Take 1 tablet (50 mcg total) by mouth daily. 90 tablet 3  . lisinopril (ZESTRIL) 20 MG tablet Take 1 tablet (20 mg total) by mouth daily. 90 tablet 3  . Multiple Vitamin (MULTIVITAMIN WITH MINERALS) TABS tablet Take 1 tablet by mouth daily. Centrum Silver    . naproxen sodium (ALEVE) 220 MG tablet Take 440 mg by mouth 2 (two) times daily as needed (for pain.).    Marland Kitchen omeprazole (PRILOSEC) 20 MG capsule Take 1 capsule (20 mg total) by mouth daily. 90 capsule 3  . tiotropium (SPIRIVA HANDIHALER) 18 MCG inhalation capsule Place 1 capsule (18 mcg total) into inhaler and inhale daily. 30 capsule 12  . nitroGLYCERIN (NITROSTAT) 0.4 MG SL tablet Place 1 tablet (0.4 mg total) under the tongue every 5 (five) minutes as needed for chest pain. 25 tablet 3   No facility-administered medications prior to visit.    Allergies  Allergen Reactions  . Crestor [Rosuvastatin Calcium] Other (See Comments)    Lethargy and muscle aches  . Flomax [Tamsulosin Hcl] Other (See Comments)    Made patient hypotensive, clammy, sweaty, and feel like he was going to pass out (EMS HAD TO BE  CALLED)  . Lipitor [Atorvastatin Calcium] Other (See Comments)    Body cramps, lethargy and muscle aches    ROS Review of Systems As per HPI.    Objective:    Physical Exam Vitals and nursing note reviewed.  Constitutional:      General: He is not in acute distress.    Appearance: He is not toxic-appearing or diaphoretic.  Cardiovascular:     Rate and Rhythm:  Normal rate and regular rhythm.     Heart sounds: Normal heart sounds. No murmur heard.   Abdominal:     General: Bowel sounds are normal.     Palpations: Abdomen is soft.     Tenderness: There is no abdominal tenderness. There is no guarding or rebound.     Hernia: No hernia is present.  Musculoskeletal:     Right lower leg: No edema.     Left lower leg: No edema.  Skin:    General: Skin is warm and dry.  Neurological:     General: No focal deficit present.     Mental Status: He is alert and oriented to person, place, and time.  Psychiatric:        Mood and Affect: Mood normal.        Behavior: Behavior normal.     BP 108/66   Pulse 100   Temp 98 F (36.7 C)   Ht 5\' 10"  (1.778 m)   Wt 146 lb 3.2 oz (66.3 kg)   SpO2 95%   BMI 20.98 kg/m  Wt Readings from Last 3 Encounters:  08/01/20 146 lb 3.2 oz (66.3 kg)  07/23/20 149 lb 8 oz (67.8 kg)  03/06/20 147 lb (66.7 kg)     There are no preventive care reminders to display for this patient.  There are no preventive care reminders to display for this patient.  Lab Results  Component Value Date   TSH 2.830 03/09/2020   Lab Results  Component Value Date   WBC 10.7 03/09/2020   HGB 16.6 03/09/2020   HCT 48.6 03/09/2020   MCV 92 03/09/2020   PLT 204 03/09/2020   Lab Results  Component Value Date   NA 140 03/09/2020   K 5.2 03/09/2020   CO2 23 03/09/2020   GLUCOSE 100 (H) 03/09/2020   BUN 16 03/09/2020   CREATININE 1.19 03/09/2020   BILITOT 0.5 03/09/2020   ALKPHOS 125 (H) 03/09/2020   AST 20 03/09/2020   ALT 12 03/09/2020   PROT 7.4  03/09/2020   ALBUMIN 4.7 03/09/2020   CALCIUM 10.0 03/09/2020   ANIONGAP 10 09/11/2019   Lab Results  Component Value Date   CHOL 220 (H) 03/09/2020   Lab Results  Component Value Date   HDL 52 03/09/2020   Lab Results  Component Value Date   LDLCALC 151 (H) 03/09/2020   Lab Results  Component Value Date   TRIG 98 03/09/2020   Lab Results  Component Value Date   CHOLHDL 4.2 03/09/2020   Lab Results  Component Value Date   HGBA1C 5.9 (H) 07/13/2017      Assessment & Plan:   Jaqwon was seen today for diverticulitis.  Diagnoses and all orders for this visit:  Diverticulitis Unremarkable abdominal exam today. Previously treated with augmentin without resolution. Cipro and flagyl ordered. Strict return precautions given.  -     ciprofloxacin (CIPRO) 500 MG tablet; Take 1 tablet (500 mg total) by mouth 2 (two) times daily for 10 days. -     metroNIDAZOLE (FLAGYL) 500 MG tablet; Take 1 tablet (500 mg total) by mouth 3 (three) times daily for 10 days.    Follow-up: Return if symptoms worsen or fail to improve.   The patient indicates understanding of these issues and agrees with the plan.  Gwenlyn Perking, FNP

## 2020-09-03 ENCOUNTER — Other Ambulatory Visit: Payer: Self-pay

## 2020-09-03 ENCOUNTER — Ambulatory Visit (INDEPENDENT_AMBULATORY_CARE_PROVIDER_SITE_OTHER): Payer: PPO | Admitting: Family Medicine

## 2020-09-03 ENCOUNTER — Encounter: Payer: Self-pay | Admitting: Family Medicine

## 2020-09-03 VITALS — BP 135/71 | HR 83 | Ht 70.0 in | Wt 144.0 lb

## 2020-09-03 DIAGNOSIS — E782 Mixed hyperlipidemia: Secondary | ICD-10-CM | POA: Diagnosis not present

## 2020-09-03 DIAGNOSIS — E039 Hypothyroidism, unspecified: Secondary | ICD-10-CM | POA: Diagnosis not present

## 2020-09-03 DIAGNOSIS — J449 Chronic obstructive pulmonary disease, unspecified: Secondary | ICD-10-CM | POA: Diagnosis not present

## 2020-09-03 DIAGNOSIS — I10 Essential (primary) hypertension: Secondary | ICD-10-CM | POA: Diagnosis not present

## 2020-09-03 MED ORDER — NITROGLYCERIN 0.4 MG SL SUBL: 0.4000 mg | SUBLINGUAL_TABLET | SUBLINGUAL | 3 refills | Status: DC | PRN

## 2020-09-03 NOTE — Progress Notes (Signed)
BP 135/71   Pulse 83   Ht _0  (1.778 m)   Wt 144 lb (65.3 kg)   SpO2 97%   BMI 20.66 kg/m    Subjective:   Patient ID: [name] [name], male    DOB: 03-Jan-1951, 70 y.o.   MRN: [id number]  HPI: [name] [name] is a 70 y.o. male presenting on 09/03/2020 for Medical Management of Chronic Issues, Hypothyroidism, Hypertension, and COPD   HPI Hypothyroidism recheck Patient is coming in for thyroid recheck today as well. They deny any issues with hair changes or heat or cold problems or diarrhea or constipation. They deny any chest pain or palpitations. They are currently on levothyroxine 50 micrograms   Hyperlipidemia Patient is coming in for recheck of his hyperlipidemia. The patient is currently taking Nexlizet. They deny any issues with myalgias or history of liver damage from it. They deny any focal numbness or weakness or chest pain.   Hypertension Patient is currently on lisinopril, and their blood pressure today is 135/71. Patient denies any lightheadedness or dizziness. Patient denies headaches, blurred vision, chest pains, shortness of breath, or weakness. Denies any side effects from medication and is content with current medication.   COPD Patient is coming in for COPD recheck today.  He is currently on no medication currently, is having some wheezing and coughing.  He has a mild chronic cough but denies any major coughing spells or wheezing spells.  He has 1nighttime symptoms per week and 2daytime symptoms per week currently.  Patient came off of his inhalers during his Covid bout and feels like he is having some coughing spells but does not want to go back on the inhalers at this point.  Encouraged restarting at least the Spiriva and he will think about it.  He does have some already.  Patient is still smoking  Relevant past medical, surgical, family and social history reviewed and updated as indicated. Interim medical history since our last visit reviewed. Allergies  and medications reviewed and updated.  Review of Systems  Constitutional: Negative for chills and fever.  Eyes: Negative for visual disturbance.  Respiratory: Positive for cough and wheezing. Negative for shortness of breath.   Cardiovascular: Negative for chest pain and leg swelling.  Musculoskeletal: Negative for back pain and gait problem.  Skin: Negative for rash.  Neurological: Negative for dizziness, weakness and numbness.  All other systems reviewed and are negative.   Per HPI unless specifically indicated above   Allergies as of 09/03/2020      Reactions   Crestor [rosuvastatin Calcium] Other (See Comments)   Lethargy and muscle aches   Flomax [tamsulosin Hcl] Other (See Comments)   Made patient hypotensive, clammy, sweaty, and feel like he was going to pass out (EMS HAD TO BE CALLED)   Lipitor [atorvastatin Calcium] Other (See Comments)   Body cramps, lethargy and muscle aches      Medication List       Accurate as of September 03, 2020  9:21 AM. If you have any questions, ask your nurse or doctor.        STOP taking these medications   albuterol 108 (90 Base) MCG/ACT inhaler Commonly known as: VENTOLIN HFA Stopped by: Fransisca Kaufmann Dettinger, MD   fluticasone 50 MCG/ACT nasal spray Commonly known as: FLONASE Stopped by: Fransisca Kaufmann Dettinger, MD   naproxen sodium 220 MG tablet Commonly known as: ALEVE Stopped by: Fransisca Kaufmann Dettinger, MD   nitroGLYCERIN 0.4 MG SL tablet Commonly known  as: NITROSTAT Stopped by: Worthy Rancher, MD   Spiriva HandiHaler 18 MCG inhalation capsule Generic drug: tiotropium Stopped by: Worthy Rancher, MD   VITAMIN D-3 PO Stopped by: Fransisca Kaufmann Dettinger, MD     TAKE these medications   ezetimibe 10 MG tablet Commonly known as: Zetia Take 1 tablet (10 mg total) by mouth daily.   levothyroxine 50 MCG tablet Commonly known as: SYNTHROID Take 1 tablet (50 mcg total) by mouth daily.   lisinopril 20 MG tablet Commonly known as:  ZESTRIL Take 1 tablet (20 mg total) by mouth daily.   multivitamin with minerals Tabs tablet Take 1 tablet by mouth daily. Centrum Silver   Nexlizet 180-10 MG Tabs Generic drug: Bempedoic Acid-Ezetimibe Take 1 tablet by mouth daily.   omeprazole 20 MG capsule Commonly known as: PRILOSEC Take 1 capsule (20 mg total) by mouth daily.        Objective:   BP 135/71   Pulse 83   Ht _0  (1.778 m)   Wt 144 lb (65.3 kg)   SpO2 97%   BMI 20.66 kg/m   Wt Readings from Last 3 Encounters:  09/03/20 144 lb (65.3 kg)  08/01/20 146 lb 3.2 oz (66.3 kg)  07/23/20 149 lb 8 oz (67.8 kg)    Physical Exam Vitals and nursing note reviewed.  Constitutional:      General: He is not in acute distress.    Appearance: He is well-developed. He is not diaphoretic.  Eyes:     General: No scleral icterus.    Conjunctiva/sclera: Conjunctivae normal.  Neck:     Thyroid: No thyromegaly.  Cardiovascular:     Rate and Rhythm: Normal rate and regular rhythm.     Heart sounds: Normal heart sounds. No murmur heard.   Pulmonary:     Effort: Pulmonary effort is normal. No respiratory distress.     Breath sounds: Normal breath sounds. No wheezing, rhonchi or rales.  Musculoskeletal:        General: Normal range of motion.     Cervical back: Neck supple.  Lymphadenopathy:     Cervical: No cervical adenopathy.  Skin:    General: Skin is warm and dry.     Findings: No rash.  Neurological:     Mental Status: He is alert and oriented to person, place, and time.     Coordination: Coordination normal.  Psychiatric:        Behavior: Behavior normal.       Assessment & Plan:   Problem List Items Addressed This Visit      Cardiovascular and Mediastinum   Essential hypertension   Relevant Medications   nitroGLYCERIN (NITROSTAT) 0.4 MG SL tablet   Other Relevant Orders   CMP14+EGFR     Respiratory   COPD (chronic obstructive pulmonary disease) (HCC)   Relevant Orders   CBC with  Differential/Platelet     Endocrine   Hypothyroidism - Primary   Relevant Orders   CBC with Differential/Platelet   TSH     Other   HLD (hyperlipidemia)   Relevant Medications   nitroGLYCERIN (NITROSTAT) 0.4 MG SL tablet   Other Relevant Orders   Lipid panel      Patient is taking the Nexlizet and seems to be doing well, will check levels today. No change in medications, seems to be doing well. Follow up plan: Return in about 6 months (around 03/06/2021), or if symptoms worsen or fail to improve, for 6 months with me for hypertension  cholesterol, schedule an appointment with Almyra Free for cholesterol .  Counseling provided for all of the vaccine components No orders of the defined types were placed in this encounter.   Caryl Pina, MD [facility] 09/03/2020, 9:21 AM

## 2020-09-04 LAB — CBC WITH DIFFERENTIAL/PLATELET
Basophils Absolute: 0 10*3/uL (ref 0.0–0.2)
Basos: 1 %
EOS (ABSOLUTE): 0.1 10*3/uL (ref 0.0–0.4)
Eos: 1 %
Hematocrit: 46.5 % (ref 37.5–51.0)
Hemoglobin: 15.9 g/dL (ref 13.0–17.7)
Immature Grans (Abs): 0 10*3/uL (ref 0.0–0.1)
Immature Granulocytes: 0 %
Lymphocytes Absolute: 3.2 10*3/uL — ABNORMAL HIGH (ref 0.7–3.1)
Lymphs: 41 %
MCH: 31.7 pg (ref 26.6–33.0)
MCHC: 34.2 g/dL (ref 31.5–35.7)
MCV: 93 fL (ref 79–97)
Monocytes Absolute: 0.8 10*3/uL (ref 0.1–0.9)
Monocytes: 11 %
Neutrophils Absolute: 3.6 10*3/uL (ref 1.4–7.0)
Neutrophils: 46 %
Platelets: 248 10*3/uL (ref 150–450)
RBC: 5.02 x10E6/uL (ref 4.14–5.80)
RDW: 12.9 % (ref 11.6–15.4)
WBC: 7.9 10*3/uL (ref 3.4–10.8)

## 2020-09-04 LAB — CMP14+EGFR
ALT: 17 IU/L (ref 0–44)
AST: 24 IU/L (ref 0–40)
Albumin/Globulin Ratio: 1.5 (ref 1.2–2.2)
Albumin: 4.4 g/dL (ref 3.8–4.8)
Alkaline Phosphatase: 111 IU/L (ref 44–121)
BUN/Creatinine Ratio: 13 (ref 10–24)
BUN: 16 mg/dL (ref 8–27)
Bilirubin Total: 0.4 mg/dL (ref 0.0–1.2)
CO2: 24 mmol/L (ref 20–29)
Calcium: 10.1 mg/dL (ref 8.6–10.2)
Chloride: 103 mmol/L (ref 96–106)
Creatinine, Ser: 1.21 mg/dL (ref 0.76–1.27)
Globulin, Total: 3 g/dL (ref 1.5–4.5)
Glucose: 114 mg/dL — ABNORMAL HIGH (ref 65–99)
Potassium: 5.6 mmol/L — ABNORMAL HIGH (ref 3.5–5.2)
Sodium: 140 mmol/L (ref 134–144)
Total Protein: 7.4 g/dL (ref 6.0–8.5)
eGFR: 65 mL/min/{1.73_m2} (ref >59)

## 2020-09-04 LAB — LIPID PANEL
Chol/HDL Ratio: 3.5 ratio (ref 0.0–5.0)
Cholesterol, Total: 173 mg/dL (ref 100–199)
HDL: 50 mg/dL (ref >39)
LDL Chol Calc (NIH): 104 mg/dL — ABNORMAL HIGH (ref 0–99)
Triglycerides: 103 mg/dL (ref 0–149)
VLDL Cholesterol Cal: 19 mg/dL (ref 5–40)

## 2020-09-04 LAB — TSH: TSH: 1.7 u[IU]/mL (ref 0.450–4.500)

## 2021-01-17 ENCOUNTER — Encounter: Payer: Self-pay | Admitting: Nurse Practitioner

## 2021-01-17 ENCOUNTER — Other Ambulatory Visit: Payer: Self-pay

## 2021-01-17 ENCOUNTER — Ambulatory Visit (INDEPENDENT_AMBULATORY_CARE_PROVIDER_SITE_OTHER): Payer: Self-pay | Admitting: Nurse Practitioner

## 2021-01-17 DIAGNOSIS — Z0289 Encounter for other administrative examinations: Secondary | ICD-10-CM

## 2021-01-17 DIAGNOSIS — Z024 Encounter for examination for driving license: Secondary | ICD-10-CM

## 2021-01-17 LAB — URINALYSIS
Bilirubin, UA: NEGATIVE
Glucose, UA: NEGATIVE
Ketones, UA: NEGATIVE
Leukocytes,UA: NEGATIVE
Nitrite, UA: NEGATIVE
Protein,UA: NEGATIVE
RBC, UA: NEGATIVE
Specific Gravity, UA: 1.02 (ref 1.005–1.030)
Urobilinogen, Ur: 0.2 mg/dL (ref 0.2–1.0)
pH, UA: 6.5 (ref 5.0–7.5)

## 2021-01-17 NOTE — Progress Notes (Signed)
Private DOT- see scanned in document 

## 2021-02-28 ENCOUNTER — Other Ambulatory Visit: Payer: Self-pay

## 2021-02-28 ENCOUNTER — Encounter: Payer: Self-pay | Admitting: Family Medicine

## 2021-02-28 ENCOUNTER — Ambulatory Visit (INDEPENDENT_AMBULATORY_CARE_PROVIDER_SITE_OTHER): Payer: PPO | Admitting: Family Medicine

## 2021-02-28 VITALS — BP 137/73 | HR 93 | Temp 97.8°F | Ht 70.0 in | Wt 145.0 lb

## 2021-02-28 DIAGNOSIS — R140 Abdominal distension (gaseous): Secondary | ICD-10-CM

## 2021-02-28 DIAGNOSIS — K5904 Chronic idiopathic constipation: Secondary | ICD-10-CM | POA: Diagnosis not present

## 2021-02-28 DIAGNOSIS — E039 Hypothyroidism, unspecified: Secondary | ICD-10-CM

## 2021-02-28 MED ORDER — LUBIPROSTONE 24 MCG PO CAPS: 24.0000 ug | ORAL_CAPSULE | Freq: Two times a day (BID) | ORAL | 0 refills | Status: DC

## 2021-02-28 NOTE — Progress Notes (Signed)
Acute Office Visit  Subjective:    Patient ID: [name] [name], male    DOB: 02/06/1951, 70 y.o.   MRN: [id number]  Chief Complaint  Patient presents with   Bloated    HPI Patient is in today for abdominal bloating x 1 week. He denies fever, chills, abdominal pain, diarrhea, nausea, vomiting, or diarrhea. Reports history of chronic constipation. He takes OTC stool/softener laxative daily without much relief. . Had small bowel movement today. He has not had a good bowel movement in 4-5 days.   He would like to have his tyroid checked today to make sure this is not causing his constipation.   Past Medical History:  Diagnosis Date   Allergy    Anxiety    Patient denies   Arthritis    CAD (coronary artery disease)    The EF was 55% with inferior hypokinesis. Cath 09   Carotid stenosis    Coronary artery disease    left main had luminal irregularities, the LAD had 30% stenosis, the first diagonal had moderate-sized with ostial 35% stenosis, the circumflex in the AV groove had ostial 25% stenosis, the first obtuse marginal had long proximal 30% stenosis, the mid obtuse marginal was normal, the right coronary artery was dominant and occluded proximally.  There was excellent left to right collateral filling.      Depression    Patient denies   Diverticulitis    Dyslipidemia    Essential hypertension 08/24/2017   GERD (gastroesophageal reflux disease)    Lung nodule    benign   Osteoporosis    Tobacco abuse     Past Surgical History:  Procedure Laterality Date   CAROTID ENDARTERECTOMY Right    COLONOSCOPY     LEFT HEART CATH AND CORONARY ANGIOGRAPHY N/A 08/04/2017   Procedure: LEFT HEART CATH AND CORONARY ANGIOGRAPHY;  Surgeon: Burnell Blanks, MD;  Location: [facility];  Service: Cardiovascular;  Laterality: N/A;   left inguinal herniorrhaphy     THROAT SURGERY      Family History  Problem Relation Age of Onset   Lung disease Father    Lupus Mother     Lupus Brother    Alcohol abuse Brother    Lung disease Brother    Cancer Brother        colon   Lung disease Brother    Healthy Daughter    Healthy Son    Healthy Son    Healthy Son    Esophageal cancer Neg Hx    Rectal cancer Neg Hx    Stomach cancer Neg Hx    Pancreatic cancer Neg Hx     Social History   Socioeconomic History   Marital status: Married    Spouse name: Logan   Number of children: 4   Years of education: high school diploma   Highest education level: 12th grade  Occupational History   Occupation: truck Education administrator: [employer]  Tobacco Use   Smoking status: Every Day    Packs/day: 1.50    Years: 40.00    Pack years: 60.00    Types: Cigarettes   Smokeless tobacco: Never  Vaping Use   Vaping Use: Never used  Substance and Sexual Activity   Alcohol use: Yes    Alcohol/week: 1.0 standard drink    Types: 1 Shots of liquor per week   Drug use: No   Sexual activity: Yes    Birth control/protection: None  Other Topics Concern  Not on file  Social History Narrative   Lives with wife in a split level home.    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   Has 1 daughter, 3 sons, and many grandchildren.   Social Determinants of Health   Financial Resource Strain: Not on file  Food Insecurity: Not on file  Transportation Needs: Not on file  Physical Activity: Not on file  Stress: Not on file  Social Connections: Not on file  Intimate Partner Violence: Not on file    Outpatient Medications Prior to Visit  Medication Sig Dispense Refill   albuterol (VENTOLIN HFA) 108 (90 Base) MCG/ACT inhaler INHALE 2 PUFFS EVERY 4 HOURS AS NEEDED FOR WHEEZING OR SHORTNESS OF BREATH     Bempedoic Acid-Ezetimibe (NEXLIZET) 180-10 MG TABS Take 1 tablet by mouth daily. 30 tablet 3   levothyroxine (SYNTHROID) 50 MCG tablet Take 1 tablet (50 mcg total) by mouth daily. 90 tablet 3   lisinopril (ZESTRIL) 20 MG tablet Take 1 tablet (20 mg total) by mouth  daily. 90 tablet 3   omeprazole (PRILOSEC) 20 MG capsule Take 1 capsule (20 mg total) by mouth daily. 90 capsule 3   SPIRIVA HANDIHALER 18 MCG inhalation capsule 1 capsule daily.     nitroGLYCERIN (NITROSTAT) 0.4 MG SL tablet Place 1 tablet (0.4 mg total) under the tongue every 5 (five) minutes as needed for chest pain. 25 tablet 3   Multiple Vitamin (MULTIVITAMIN WITH MINERALS) TABS tablet Take 1 tablet by mouth daily. Centrum Silver     No facility-administered medications prior to visit.    Allergies  Allergen Reactions   Crestor [Rosuvastatin Calcium] Other (See Comments)    Lethargy and muscle aches   Flomax [Tamsulosin Hcl] Other (See Comments)    Made patient hypotensive, clammy, sweaty, and feel like he was going to pass out (EMS HAD TO BE CALLED)   Lipitor [Atorvastatin Calcium] Other (See Comments)    Body cramps, lethargy and muscle aches    Review of Systems As per HPI.     Objective:    Physical Exam Vitals and nursing note reviewed.  Constitutional:      General: He is not in acute distress.    Appearance: He is not ill-appearing, toxic-appearing or diaphoretic.  Pulmonary:     Effort: Pulmonary effort is normal. No respiratory distress.  Abdominal:     General: Bowel sounds are normal. There is no distension.     Palpations: Abdomen is soft.     Tenderness: There is no abdominal tenderness. There is no guarding or rebound.  Skin:    General: Skin is warm and dry.  Neurological:     General: No focal deficit present.     Mental Status: He is alert and oriented to person, place, and time.  Psychiatric:        Mood and Affect: Mood normal.        Behavior: Behavior normal.    BP 137/73   Pulse 93   Temp 97.8 F (36.6 C) (Temporal)   Ht '5\' 10"'  (1.778 m)   Wt 145 lb (65.8 kg)   BMI 20.81 kg/m  Wt Readings from Last 3 Encounters:  02/28/21 145 lb (65.8 kg)  09/03/20 144 lb (65.3 kg)  08/01/20 146 lb 3.2 oz (66.3 kg)    Health Maintenance Due  Topic  Date Due   COVID-19 Vaccine (1) Never done   Zoster Vaccines- Shingrix (1 of 2) Never done    There are  no preventive care reminders to display for this patient.   Lab Results  Component Value Date   TSH 1.700 09/03/2020   Lab Results  Component Value Date   WBC 7.9 09/03/2020   HGB 15.9 09/03/2020   HCT 46.5 09/03/2020   MCV 93 09/03/2020   PLT 248 09/03/2020   Lab Results  Component Value Date   NA 140 09/03/2020   K 5.6 (H) 09/03/2020   CO2 24 09/03/2020   GLUCOSE 114 (H) 09/03/2020   BUN 16 09/03/2020   CREATININE 1.21 09/03/2020   BILITOT 0.4 09/03/2020   ALKPHOS 111 09/03/2020   AST 24 09/03/2020   ALT 17 09/03/2020   PROT 7.4 09/03/2020   ALBUMIN 4.4 09/03/2020   CALCIUM 10.1 09/03/2020   ANIONGAP 10 09/11/2019   EGFR 65 09/03/2020   Lab Results  Component Value Date   CHOL 173 09/03/2020   Lab Results  Component Value Date   HDL 50 09/03/2020   Lab Results  Component Value Date   LDLCALC 104 (H) 09/03/2020   Lab Results  Component Value Date   TRIG 103 09/03/2020   Lab Results  Component Value Date   CHOLHDL 3.5 09/03/2020   Lab Results  Component Value Date   HGBA1C 5.9 (H) 07/13/2017       Assessment & Plan:   Axtyn was seen today for bloated.  Diagnoses and all orders for this visit:  Chronic idiopathic constipation Has tried OTC laxatives and stool softener without much improvement. Try amitiza as below.  -     lubiprostone (AMITIZA) 24 MCG capsule; Take 1 capsule (24 mcg total) by mouth 2 (two) times daily with a meal.  Abdominal bloating Benign exam today. Discussed bloating due to constipation.   Acquired hypothyroidism On synthroid. Last TSH was stable, will recheck today.  -     TSH  Return to office for new or worsening symptoms, or if symptoms persist.   The patient indicates understanding of these issues and agrees with the plan.  Gwenlyn Perking, FNP

## 2021-02-28 NOTE — Patient Instructions (Signed)
Chronic Constipation Chronic constipation is a condition in which a person has three or fewer bowel movements a week, for 3 months or longer. This condition is especially commonin older adults. What are the causes? Causes of chronic constipation may include: Not drinking enough fluid, eating enough food or fiber, or getting enough physical activity. Pregnancy. A tear in the anus (anal fissure). Blockage in the bowel (bowel obstruction). Narrowing of the bowel (bowel stricture). Having a long-term medical condition, such as: Diabetes, hypothyroidism, or iron-deficiency anemia. Stroke or spinal cord injury. Multiple sclerosis or Parkinson's disease. Colon cancer. Dementia. Inflammatory bowel disease (IBD), outward collapse of the rectum (rectal prolapse), or hemorrhoids. Taking certain medicines, including: Narcotics. These are a certain type of prescription pain medicine. Antacids or iron supplements. Water pills (diuretics). Certain blood pressure medicines. Anti-seizure medicines. Antidepressants. Medicines for Parkinson's disease. Other causes of this condition may include: Stress. Problems in the nerves and muscles that control the movement of stool. Weak or impaired pelvic floor muscles. What increases the risk? You may be at higher risk for chronic constipation if: You are older than age 70. You are male. You live in a long-term care facility. You have a long-term disease. You have a mental health disorder or eating disorder. What are the signs or symptoms? The main symptom of chronic constipation is having three or fewer bowel movements a week for several weeks. Other signs and symptoms may vary from person to person. These include: Pushing hard (straining) to pass stool, or having hard or lumpy stools. Painful bowel movements. Having lower abdominal discomfort, such as cramps or bloating. Being unable to have a bowel movement when you feel the urge, or feeling like  you still need to pass stool after a bowel movement. Feeling that you have something in your rectum that is blocking or preventing bowel movements. Seeing blood on the toilet paper or in your stool. Worsening confusion (in older adults). How is this diagnosed? This condition may be diagnosed based on: Your symptoms and medical history. You will be asked about your symptoms, lifestyle, diet, and any medicines that you are taking. A physical exam. Your abdomen will be examined. A digital rectal exam may be done. For this exam, a health care provider places a lubricated, gloved finger into the rectum. Tests to check for any underlying causes of your constipation. These may be ordered if you have bleeding in your rectum, weight loss, or a family history of colon cancer. In these cases, you may have: Imaging studies of the colon. These may include X-ray, ultrasound, or a CT scan. Blood tests. A procedure to examine the inside of your colon (colonoscopy). More specialized tests to check: Whether your anal sphincter works well. This is a ring-shaped muscle that controls the closing of the anus. How well food moves through your colon. Tests to measure the nerve signal in your pelvic floor muscles (electromyography). How is this treated? Treatment for chronic constipation depends on the cause. Most often, treatment starts with: Being more active and getting regular exercise. Drinking more fluids. Adding fiber to your diet. Sources of fiber include fruits, vegetables, whole grains, and fiber supplements. Using medicines such as stool softeners or medicines that increase contractions in your digestive system (pro-motility agents). Training your pelvic muscles with biofeedback. Surgery, if there is obstruction. Treatment may also include: Stopping or changing some medicines if they cause constipation. Using a fiber supplement (bulk laxative) or stool softener. Using a prescription laxative. This  works by absorbing water   into your colon (osmotic laxative). You may also need to see a specialist who treats conditions of the digestive system (gastroenterologist). Follow these instructions at home: Medicines Take over-the-counter and prescription medicines only as told by your health care provider. If you are taking a laxative, take it as told by your health care provider. Eating and drinking  Eat a balanced diet that includes enough fiber. Ask your health care provider to recommend a diet that is right for you. Drink clear fluids, especially water. Avoid drinking alcohol, caffeine, and soda. These can make constipation worse. Drink enough fluid to keep your urine pale yellow.  General instructions Get some physical activity every day. Ask your health care provider what activities are safe for you. Get colon cancer screenings as told by your health care provider. Keep all follow-up visits as told by your health care provider. This is important. Contact a health care provider if you have: Three or fewer bowel movements a week. Stools that are hard or lumpy. Blood on the toilet paper or in your stool after you have a bowel movement. Unexplained weight loss. Rectum (rectal) pain. Stool leakage. Nausea or vomiting. Get help right away if you have: Rectal bleeding or you pass blood clots. Severe rectal pain. Body tissue that pushes out (protrudes) from your anus. Severe pain or bloating (distension) in your abdomen. Vomiting that you cannot control. Summary Chronic constipation is a condition in which a person has three or fewer bowel movements a week, for 3 months or longer. You may have a higher risk for this condition if you are an older adult, you are male, or you have a long-term disease. Treatment for this condition depends on the cause. Most treatments for chronic constipation include adding fiber to your diet, drinking more fluids, and getting more physical activity. You may  also need to treat any underlying medical conditions or stop or change certain medicines if they cause constipation. If lifestyle changes do not relieve constipation, your health care provider may recommend taking a laxative. This information is not intended to replace advice given to you by your health care provider. Make sure you discuss any questions you have with your healthcare provider. Document Revised: 04/27/2019 Document Reviewed: 04/27/2019 Elsevier Patient Education  2022 Else[employer] Inc[employer]  

## 2021-03-01 LAB — TSH: TSH: 2.17 u[IU]/mL (ref 0.450–4.500)

## 2021-03-13 ENCOUNTER — Other Ambulatory Visit: Payer: Self-pay | Admitting: Family Medicine

## 2021-03-20 ENCOUNTER — Ambulatory Visit (INDEPENDENT_AMBULATORY_CARE_PROVIDER_SITE_OTHER): Payer: PPO

## 2021-03-20 ENCOUNTER — Encounter: Payer: Self-pay | Admitting: Family Medicine

## 2021-03-20 ENCOUNTER — Other Ambulatory Visit: Payer: Self-pay

## 2021-03-20 ENCOUNTER — Ambulatory Visit (INDEPENDENT_AMBULATORY_CARE_PROVIDER_SITE_OTHER): Payer: PPO | Admitting: Family Medicine

## 2021-03-20 VITALS — BP 137/69 | HR 76 | Ht 70.0 in | Wt 146.0 lb

## 2021-03-20 DIAGNOSIS — E782 Mixed hyperlipidemia: Secondary | ICD-10-CM

## 2021-03-20 DIAGNOSIS — I25119 Atherosclerotic heart disease of native coronary artery with unspecified angina pectoris: Secondary | ICD-10-CM | POA: Diagnosis not present

## 2021-03-20 DIAGNOSIS — R140 Abdominal distension (gaseous): Secondary | ICD-10-CM

## 2021-03-20 DIAGNOSIS — K8020 Calculus of gallbladder without cholecystitis without obstruction: Secondary | ICD-10-CM | POA: Diagnosis not present

## 2021-03-20 DIAGNOSIS — E039 Hypothyroidism, unspecified: Secondary | ICD-10-CM

## 2021-03-20 DIAGNOSIS — I10 Essential (primary) hypertension: Secondary | ICD-10-CM | POA: Diagnosis not present

## 2021-03-20 MED ORDER — OMEPRAZOLE 20 MG PO CPDR: 20.0000 mg | DELAYED_RELEASE_CAPSULE | Freq: Every day | ORAL | 3 refills | Status: DC

## 2021-03-20 NOTE — Addendum Note (Signed)
Addended by: Caryl Pina on: 03/20/2021 10:46 AM   Modules accepted: Orders

## 2021-03-20 NOTE — Progress Notes (Signed)
BP 137/69   Pulse 76   Ht 5\' 10"  (1.778 m)   Wt 146 lb (66.2 kg)   SpO2 98%   BMI 20.95 kg/m    Subjective:   Patient ID: [name] [name], male    DOB: December 11, 1950, 70 y.o.   MRN: [id number]  HPI: [name] [name] is a 70 y.o. male presenting on 03/20/2021 for Medical Management of Chronic Issues and Hypothyroidism   HPI Hypothyroidism recheck Patient is coming in for thyroid recheck today as well. They deny any issues with hair changes or heat or cold problems or diarrhea or constipation. They deny any chest pain or palpitations. They are currently on levothyroxine 50 micrograms   Hyperlipidemia Patient is coming in for recheck of his hyperlipidemia. The patient is currently taking Nexlizet. They deny any issues with myalgias or history of liver damage from it. They deny any focal numbness or weakness or chest pain.   Hypertension and CAD Patient is currently on lisinopril, and their blood pressure today is 137/69. Patient denies any lightheadedness or dizziness. Patient denies headaches, blurred vision, chest pains, shortness of breath, or weakness. Denies any side effects from medication and is content with current medication.   Patient has been having some abdominal bloating and discomfort.  He says anytime he eats anything at all, can even be small like a cracker he feels bloated and distended and full and then cannot eat anymore.  He says is been bothering him over the past month.  Relevant past medical, surgical, family and social history reviewed and updated as indicated. Interim medical history since our last visit reviewed. Allergies and medications reviewed and updated.  Review of Systems  Constitutional:  Negative for chills and fever.  Eyes:  Negative for visual disturbance.  Respiratory:  Negative for shortness of breath and wheezing.   Cardiovascular:  Negative for chest pain and leg swelling.  Gastrointestinal:  Positive for abdominal distention. Negative for  blood in stool, constipation, diarrhea, nausea and vomiting.  Musculoskeletal:  Negative for back pain and gait problem.  Skin:  Negative for rash.  Neurological:  Negative for dizziness, weakness and numbness.  All other systems reviewed and are negative.  Per HPI unless specifically indicated above   Allergies as of 03/20/2021       Reactions   Crestor [rosuvastatin Calcium] Other (See Comments)   Lethargy and muscle aches   Flomax [tamsulosin Hcl] Other (See Comments)   Made patient hypotensive, clammy, sweaty, and feel like he was going to pass out ([facility] HAD TO BE CALLED)   Lipitor [atorvastatin Calcium] Other (See Comments)   Body cramps, lethargy and muscle aches        Medication List        Accurate as of March 20, 2021 10:43 AM. If you have any questions, ask your nurse or doctor.          albuterol 108 (90 Base) MCG/ACT inhaler Commonly known as: VENTOLIN HFA INHALE 2 PUFFS EVERY 4 HOURS AS NEEDED FOR WHEEZING OR SHORTNESS OF BREATH   levothyroxine 50 MCG tablet Commonly known as: SYNTHROID Take 1 tablet (50 mcg total) by mouth daily.   lisinopril 20 MG tablet Commonly known as: ZESTRIL Take 1 tablet (20 mg total) by mouth daily.   lubiprostone 24 MCG capsule Commonly known as: AMITIZA Take 1 capsule (24 mcg total) by mouth 2 (two) times daily with a meal.   Nexlizet 180-10 MG Tabs Generic drug: Bempedoic Acid-Ezetimibe Take 1 tablet by  mouth daily.   nitroGLYCERIN 0.4 MG SL tablet Commonly known as: NITROSTAT Place 1 tablet (0.4 mg total) under the tongue every 5 (five) minutes as needed for chest pain.   omeprazole 20 MG capsule Commonly known as: PRILOSEC Take 1 capsule (20 mg total) by mouth daily.   Spiriva HandiHaler 18 MCG inhalation capsule Generic drug: tiotropium INHALE THE CONTENTS OF 1 CAPSULE ONCE DAILY         Objective:   BP 137/69   Pulse 76   Ht 5\' 10"  (1.778 m)   Wt 146 lb (66.2 kg)   SpO2 98%   BMI 20.95 kg/m    Wt Readings from Last 3 Encounters:  03/20/21 146 lb (66.2 kg)  02/28/21 145 lb (65.8 kg)  09/03/20 144 lb (65.3 kg)    Physical Exam Vitals and nursing note reviewed.  Constitutional:      General: He is not in acute distress.    Appearance: He is well-developed. He is not diaphoretic.  Eyes:     General: No scleral icterus.    Conjunctiva/sclera: Conjunctivae normal.  Neck:     Thyroid: No thyromegaly.  Cardiovascular:     Rate and Rhythm: Normal rate and regular rhythm.     Heart sounds: Normal heart sounds. No murmur heard. Pulmonary:     Effort: Pulmonary effort is normal. No respiratory distress.     Breath sounds: Normal breath sounds. No wheezing.  Abdominal:     General: Abdomen is flat. Bowel sounds are normal. There is no distension.     Palpations: There is no mass.     Tenderness: There is no abdominal tenderness. There is no right CVA tenderness, left CVA tenderness, guarding or rebound.  Musculoskeletal:        General: Normal range of motion.  Skin:    General: Skin is warm and dry.     Findings: No rash.  Neurological:     Mental Status: He is alert and oriented to person, place, and time.     Coordination: Coordination normal.  Psychiatric:        Behavior: Behavior normal.    KUB: Patient has a large calcified lesion in the right upper quadrant of his abdomen, will await final read from radiologist but may need CT scan follow-up.  Could be large gallstone versus tumor on his colon  Assessment & Plan:   Problem List Items Addressed This Visit       Cardiovascular and Mediastinum   Coronary artery disease involving native coronary artery of native heart with angina pectoris ([place])   Essential hypertension     Endocrine   Hypothyroidism - Primary     Other   HLD (hyperlipidemia)   Other Visit Diagnoses     Abdominal bloating       Relevant Orders   DG Abd 1 View (Completed)   Ambulatory referral to Gastroenterology     Continue current  medicine, is taking the Amitiza and does not feel like it helped a time, is also taking Prilosec.  No other changes in medication, his thyroid looks good at his last visit. Thyroid level looked good at last visit, will not recheck blood work today.  Radiologist read this is a large gallstone that had been there previous.  May be causing him issues now, will send to GI. Follow up plan: Return in about 6 months (around 09/17/2021), or if symptoms worsen or fail to improve, for Thyroid and cholesterol and physical.  Counseling provided for  all of the vaccine components Orders Placed This Encounter  Procedures   DG Abd 1 View   Ambulatory referral to Gastroenterology    Caryl Pina, MD [facility] 03/20/2021, 10:43 AM

## 2021-04-02 ENCOUNTER — Telehealth: Payer: Self-pay | Admitting: Family Medicine

## 2021-04-02 NOTE — Telephone Encounter (Signed)
Pt is fine seeing [facility]. Pt has been given the number. Referral has been placed.  Pt still would like to discuss his options with Dr [name] and states that he has an appt with him on 10/20.

## 2021-04-02 NOTE — Telephone Encounter (Signed)
I did not realize that Dr. [name] was a general surgeon who he was talking about.  I recommended for him to go see a gastroenterologist first based on his symptoms, the referral has been placed for gastroenterology.  I do not know that a general surgeon would help as much at this point

## 2021-04-02 NOTE — Telephone Encounter (Signed)
Patient wanted to let Dr Dettinger know that he has an appointment with Dr [name] on  Apr 15, 2021

## 2021-04-03 ENCOUNTER — Other Ambulatory Visit: Payer: Self-pay | Admitting: Family Medicine

## 2021-04-04 NOTE — Telephone Encounter (Signed)
I have to have a diagnosis to place a referral and I do not know that I have a diagnosis that would be approved for referral to a general surgeon at this point.  So I do not know if I can place one for that and have it approved

## 2021-04-04 NOTE — Telephone Encounter (Signed)
Pt already has an appt with Dr. [name]. He made our office aware of this. No referral needs to be placed. He has seen Dr. [name] for his hernia in the past.

## 2021-04-10 ENCOUNTER — Ambulatory Visit (INDEPENDENT_AMBULATORY_CARE_PROVIDER_SITE_OTHER): Payer: PPO

## 2021-04-10 VITALS — Ht 70.0 in | Wt 146.0 lb

## 2021-04-10 DIAGNOSIS — T46906A Underdosing of unspecified agents primarily affecting the cardiovascular system, initial encounter: Secondary | ICD-10-CM | POA: Diagnosis not present

## 2021-04-10 DIAGNOSIS — Z91120 Patient's intentional underdosing of medication regimen due to financial hardship: Secondary | ICD-10-CM

## 2021-04-10 DIAGNOSIS — F17200 Nicotine dependence, unspecified, uncomplicated: Secondary | ICD-10-CM | POA: Diagnosis not present

## 2021-04-10 DIAGNOSIS — Z0001 Encounter for general adult medical examination with abnormal findings: Secondary | ICD-10-CM | POA: Diagnosis not present

## 2021-04-10 DIAGNOSIS — Z0000 Encounter for general adult medical examination without abnormal findings: Secondary | ICD-10-CM

## 2021-04-10 NOTE — Patient Instructions (Signed)
[name] , Thank you for taking time to come for your Medicare Wellness Visit. I appreciate your ongoing commitment to your health goals. Please review the following plan we discussed and let me know if I can assist you in the future.   Screening recommendations/referrals: Colonoscopy: Done 01/19/2017 - Repeat in 10 years Recommended yearly ophthalmology/optometry visit for glaucoma screening and checkup Recommended yearly dental visit for hygiene and checkup  Vaccinations: Influenza vaccine: Declined Pneumococcal vaccine: Done 06/29/2017 & 01/07/2016 Tdap vaccine: Due. Every 10 years Shingles vaccine: Due. Discussed   Covid-19: Declined  Advanced directives: Please bring a copy of your health care power of attorney and living will to the office to be added to your chart at your convenience.   Conditions/risks identified: Aim for 30 minutes of exercise or brisk walking each day, drink 6-8 glasses of water and eat lots of fruits and vegetables.   Next appointment: Follow up in one year for your annual wellness visit.   Preventive Care 26 Years and Older, Male  Preventive care refers to lifestyle choices and visits with your health care provider that can promote health and wellness. What does preventive care include? A yearly physical exam. This is also called an annual well check. Dental exams once or twice a year. Routine eye exams. Ask your health care provider how often you should have your eyes checked. Personal lifestyle choices, including: Daily care of your teeth and gums. Regular physical activity. Eating a healthy diet. Avoiding tobacco and drug use. Limiting alcohol use. Practicing safe sex. Taking low doses of aspirin every day. Taking vitamin and mineral supplements as recommended by your health care provider. What happens during an annual well check? The services and screenings done by your health care provider during your annual well check will depend on your age,  overall health, lifestyle risk factors, and family history of disease. Counseling  Your health care provider may ask you questions about your: Alcohol use. Tobacco use. Drug use. Emotional well-being. Home and relationship well-being. Sexual activity. Eating habits. History of falls. Memory and ability to understand (cognition). Work and work Statistician. Screening  You may have the following tests or measurements: Height, weight, and BMI. Blood pressure. Lipid and cholesterol levels. These may be checked every 5 years, or more frequently if you are over 51 years old. Skin check. Lung cancer screening. You may have this screening every year starting at age 72 if you have a 30-pack-year history of smoking and currently smoke or have quit within the past 15 years. Fecal occult blood test (FOBT) of the stool. You may have this test every year starting at age 14. Flexible sigmoidoscopy or colonoscopy. You may have a sigmoidoscopy every 5 years or a colonoscopy every 10 years starting at age 58. Prostate cancer screening. Recommendations will vary depending on your family history and other risks. Hepatitis C blood test. Hepatitis B blood test. Sexually transmitted disease (STD) testing. Diabetes screening. This is done by checking your blood sugar (glucose) after you have not eaten for a while (fasting). You may have this done every 1-3 years. Abdominal aortic aneurysm (AAA) screening. You may need this if you are a current or former smoker. Osteoporosis. You may be screened starting at age 18 if you are at high risk. Talk with your health care provider about your test results, treatment options, and if necessary, the need for more tests. Vaccines  Your health care provider may recommend certain vaccines, such as: Influenza vaccine. This is recommended  every year. Tetanus, diphtheria, and acellular pertussis (Tdap, Td) vaccine. You may need a Td booster every 10 years. Zoster vaccine.  You may need this after age 79. Pneumococcal 13-valent conjugate (PCV13) vaccine. One dose is recommended after age 70. Pneumococcal polysaccharide (PPSV23) vaccine. One dose is recommended after age 70. Talk to your health care provider about which screenings and vaccines you need and how often you need them. This information is not intended to replace advice given to you by your health care provider. Make sure you discuss any questions you have with your health care provider. Document Released: 07/06/2015 Document Revised: 02/27/2016 Document Reviewed: 04/10/2015 Elsevier Interactive Patient Education  2017 [place] Prevention in the Home Falls can cause injuries. They can happen to people of all ages. There are many things you can do to make your home safe and to help prevent falls. What can I do on the outside of my home? Regularly fix the edges of walkways and driveways and fix any cracks. Remove anything that might make you trip as you walk through a door, such as a raised step or threshold. Trim any bushes or trees on the path to your home. Use bright outdoor lighting. Clear any walking paths of anything that might make someone trip, such as rocks or tools. Regularly check to see if handrails are loose or broken. Make sure that both sides of any steps have handrails. Any raised decks and porches should have guardrails on the edges. Have any leaves, snow, or ice cleared regularly. Use sand or salt on walking paths during winter. Clean up any spills in your garage right away. This includes oil or grease spills. What can I do in the bathroom? Use night lights. Install grab bars by the toilet and in the tub and shower. Do not use towel bars as grab bars. Use non-skid mats or decals in the tub or shower. If you need to sit down in the shower, use a plastic, non-slip stool. Keep the floor dry. Clean up any water that spills on the floor as soon as it happens. Remove soap  buildup in the tub or shower regularly. Attach bath mats securely with double-sided non-slip rug tape. Do not have throw rugs and other things on the floor that can make you trip. What can I do in the bedroom? Use night lights. Make sure that you have a light by your bed that is easy to reach. Do not use any sheets or blankets that are too big for your bed. They should not hang down onto the floor. Have a firm chair that has side arms. You can use this for support while you get dressed. Do not have throw rugs and other things on the floor that can make you trip. What can I do in the kitchen? Clean up any spills right away. Avoid walking on wet floors. Keep items that you use a lot in easy-to-reach places. If you need to reach something above you, use a strong step stool that has a grab bar. Keep electrical cords out of the way. Do not use floor polish or wax that makes floors slippery. If you must use wax, use non-skid floor wax. Do not have throw rugs and other things on the floor that can make you trip. What can I do with my stairs? Do not leave any items on the stairs. Make sure that there are handrails on both sides of the stairs and use them. Fix handrails that are broken  or loose. Make sure that handrails are as long as the stairways. Check any carpeting to make sure that it is firmly attached to the stairs. Fix any carpet that is loose or worn. Avoid having throw rugs at the top or bottom of the stairs. If you do have throw rugs, attach them to the floor with carpet tape. Make sure that you have a light switch at the top of the stairs and the bottom of the stairs. If you do not have them, ask someone to add them for you. What else can I do to help prevent falls? Wear shoes that: Do not have high heels. Have rubber bottoms. Are comfortable and fit you well. Are closed at the toe. Do not wear sandals. If you use a stepladder: Make sure that it is fully opened. Do not climb a closed  stepladder. Make sure that both sides of the stepladder are locked into place. Ask someone to hold it for you, if possible. Clearly mark and make sure that you can see: Any grab bars or handrails. First and last steps. Where the edge of each step is. Use tools that help you move around (mobility aids) if they are needed. These include: Canes. Walkers. Scooters. Crutches. Turn on the lights when you go into a dark area. Replace any light bulbs as soon as they burn out. Set up your furniture so you have a clear path. Avoid moving your furniture around. If any of your floors are uneven, fix them. If there are any pets around you, be aware of where they are. Review your medicines with your doctor. Some medicines can make you feel dizzy. This can increase your chance of falling. Ask your doctor what other things that you can do to help prevent falls. This information is not intended to replace advice given to you by your health care provider. Make sure you discuss any questions you have with your health care provider. Document Released: 04/05/2009 Document Revised: 11/15/2015 Document Reviewed: 07/14/2014 Elsevier Interactive Patient Education  2017 [employer].

## 2021-04-10 NOTE — Progress Notes (Signed)
Subjective:   [name] [name] is a 70 y.o. male who presents for Medicare Annual/Subsequent preventive examination.  Virtual Visit via Telephone Note  I connected with  [name] [name] on 04/10/21 at  4:15 PM EDT by telephone and verified that I am speaking with the correct person using two identifiers.  Location: Patient: Home Provider: [facility] Persons participating in the virtual visit: patient/Nurse Health Advisor   I discussed the limitations, risks, security and privacy concerns of performing an evaluation and management service by telephone and the availability of in person appointments. The patient expressed understanding and agreed to proceed.  Interactive audio and video telecommunications were attempted between this nurse and patient, however failed, due to patient having technical difficulties OR patient did not have access to video capability.  We continued and completed visit with audio only.  Some vital signs may be absent or patient reported.   [name] Laboy E Meranda Dechaine, LPN   Review of Systems     Cardiac Risk Factors include: advanced age (>15men, >60 women);dyslipidemia;male gender;hypertension;smoking/ tobacco exposure;Other (see comment), Risk factor comments: COPD, atherosclerosis     Objective:    Today's Vitals   04/10/21 1608 04/10/21 1609  Weight: 146 lb (66.2 kg)   Height: 5\' 10"  (1.778 m)   PainSc:  3    Body mass index is 20.95 kg/m.  Advanced Directives 04/10/2021 07/08/2019 07/05/2018 08/04/2017 06/29/2017 01/19/2017 01/05/2017  Does Patient Have a Medical Advance Directive? No No No No No No No  Would patient like information on creating a medical advance directive? No - Patient declined No - Patient declined Yes (MAU/Ambulatory/Procedural Areas - Information given) No - Patient declined No - Patient declined - -  Pre-existing out of facility DNR order (yellow form or pink MOST form) - - - - - - -    Current Medications (verified) Outpatient Encounter  Medications as of 04/10/2021  Medication Sig   albuterol (VENTOLIN HFA) 108 (90 Base) MCG/ACT inhaler INHALE 2 PUFFS EVERY 4 HOURS AS NEEDED FOR WHEEZING OR SHORTNESS OF BREATH   Bempedoic Acid-Ezetimibe (NEXLIZET) 180-10 MG TABS Take 1 tablet by mouth daily.   levothyroxine (SYNTHROID) 50 MCG tablet TAKE 1 TABLET DAILY   lisinopril (ZESTRIL) 20 MG tablet Take 1 tablet (20 mg total) by mouth daily.   lubiprostone (AMITIZA) 24 MCG capsule Take 1 capsule (24 mcg total) by mouth 2 (two) times daily with a meal.   omeprazole (PRILOSEC) 20 MG capsule Take 1 capsule (20 mg total) by mouth daily.   SPIRIVA HANDIHALER 18 MCG inhalation capsule INHALE THE CONTENTS OF 1 CAPSULE ONCE DAILY   nitroGLYCERIN (NITROSTAT) 0.4 MG SL tablet Place 1 tablet (0.4 mg total) under the tongue every 5 (five) minutes as needed for chest pain.   No facility-administered encounter medications on file as of 04/10/2021.    Allergies (verified) Crestor [rosuvastatin calcium], Flomax [tamsulosin hcl], and Lipitor [atorvastatin calcium]   History: Past Medical History:  Diagnosis Date   Allergy    Anxiety    Patient denies   Arthritis    CAD (coronary artery disease)    The EF was 55% with inferior hypokinesis. Cath 09   Carotid stenosis    Coronary artery disease    left main had luminal irregularities, the LAD had 30% stenosis, the first diagonal had moderate-sized with ostial 35% stenosis, the circumflex in the AV groove had ostial 25% stenosis, the first obtuse marginal had long proximal 30% stenosis, the mid obtuse marginal was normal, the right  coronary artery was dominant and occluded proximally.  There was excellent left to right collateral filling.      Depression    Patient denies   Diverticulitis    Dyslipidemia    Essential hypertension 08/24/2017   GERD (gastroesophageal reflux disease)    Lung nodule    benign   Osteoporosis    Tobacco abuse    Past Surgical History:  Procedure Laterality Date    CAROTID ENDARTERECTOMY Right    COLONOSCOPY     LEFT HEART CATH AND CORONARY ANGIOGRAPHY N/A 08/04/2017   Procedure: LEFT HEART CATH AND CORONARY ANGIOGRAPHY;  Surgeon: Burnell Blanks, MD;  Location: [facility];  Service: Cardiovascular;  Laterality: N/A;   left inguinal herniorrhaphy     THROAT SURGERY     Family History  Problem Relation Age of Onset   Lung disease Father    Lupus Mother    Lupus Brother    Alcohol abuse Brother    Lung disease Brother    Cancer Brother        colon   Lung disease Brother    Healthy Daughter    Healthy Son    Healthy Son    Healthy Son    Esophageal cancer Neg Hx    Rectal cancer Neg Hx    Stomach cancer Neg Hx    Pancreatic cancer Neg Hx    Social History   Socioeconomic History   Marital status: Married    Spouse name: [place]   Number of children: 4   Years of education: high school diploma   Highest education level: 12th grade  Occupational History   Occupation: truck Education administrator: [employer]  Tobacco Use   Smoking status: Every Day    Packs/day: 1.50    Years: 40.00    Pack years: 60.00    Types: Cigarettes   Smokeless tobacco: Never  Vaping Use   Vaping Use: Never used  Substance and Sexual Activity   Alcohol use: Yes    Alcohol/week: 1.0 standard drink    Types: 1 Shots of liquor per week   Drug use: No   Sexual activity: Yes    Birth control/protection: None  Other Topics Concern   Not on file  Social History Narrative   Lives with wife in a split level home.    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   Social Determinants of Health   Financial Resource Strain: Medium Risk   Difficulty of Paying Living Expenses: Somewhat hard  Food Insecurity: No Food Insecurity   Worried About Charity fundraiser in the Last Year: Never true   Ran Out of Food in  the Last Year: Never true  Transportation Needs: No Transportation Needs   Lack of Transportation (Medical): No   Lack of Transportation (Non-Medical): No  Physical Activity: Sufficiently Active   Days of Exercise per Week: 5 days   Minutes of Exercise per Session: 60 min  Stress: No Stress Concern Present   Feeling of Stress : Not at all  Social Connections: Socially Integrated   Frequency of Communication with Friends and Family: More than three times a week   Frequency of Social Gatherings with Friends and Family: More than three times a week   Attends Religious Services: More than 4 times per  year   Active Member of Clubs or Organizations: Yes   Attends Music therapist: More than 4 times per year   Marital Status: Married    Tobacco Counseling Ready to quit: Not Answered Counseling given: Not Answered   Clinical Intake:  Pre-visit preparation completed: Yes  Pain : 0-10 Pain Score: 3  Pain Type: Neuropathic pain Pain Location: Foot Pain Orientation: Right, Left Pain Descriptors / Indicators: Aching, Discomfort Pain Onset: More than a month ago Pain Frequency: Intermittent     BMI - recorded: 20.95 Nutritional Status: BMI of 19-24  Normal Nutritional Risks: None Diabetes: No  How often do you need to have someone help you when you read instructions, pamphlets, or other written materials from your doctor or pharmacy?: 1 - Never  Diabetic? No  Interpreter Needed?: No  Information entered by :: Ninel Abdella, LPN   Activities of Daily Living In your present state of health, do you have any difficulty performing the following activities: 04/10/2021  Hearing? N  Vision? N  Difficulty concentrating or making decisions? N  Walking or climbing stairs? N  Dressing or bathing? N  Doing errands, shopping? N  Preparing Food and eating ? N  Using the Toilet? N  In the past six months, have you accidently leaked urine? N  Do you have problems with loss  of bowel control? N  Managing your Medications? N  Managing your Finances? N  Housekeeping or managing your Housekeeping? N  Some recent data might be hidden    Patient Care Team: Dettinger, Fransisca Kaufmann, MD as PCP - General (Family Medicine) Minus Breeding, MD as Consulting Physician (Cardiology) Ilean China, RN as Registered Nurse Blanca Friend, Royce Macadamia, [facility] (Pharmacist)  Indicate any recent Medical Services you may have received from other than [facility] providers in the past year (date may be approximate).     Assessment:   This is a routine wellness examination for Jaquarious.  Hearing/Vision screen Hearing Screening - Comments:: Denies hearing difficulties  Vision Screening - Comments:: Wears rx glasses - up to date with annual eye exams with [facility]  Dietary issues and exercise activities discussed: Current Exercise Habits: The patient has a physically strenuous job, but has no regular exercise apart from work., Exercise limited by: respiratory conditions(s)   Goals Addressed             This Visit's Progress    Exercise 150 min/wk Moderate Activity   On track    Walking and lifting weights 3-5 times per week.      Quit Smoking   Not on track      Depression Screen PHQ 2/9 Scores 04/10/2021 02/28/2021 09/03/2020 08/01/2020 03/06/2020 07/08/2019 11/04/2018  PHQ - 2 Score 0 0 0 0 0 0 0  PHQ- 9 Score 0 0 - - - - -    Fall Risk Fall Risk  04/10/2021 02/28/2021 09/03/2020 08/01/2020 03/06/2020  Falls in the past year? 0 0 0 0 0  Number falls in past yr: 0 - - - -  Injury with Fall? 0 - - - -  Risk for fall due to : No Fall Risks - - - -  Follow up Falls prevention discussed - - - -    FALL RISK PREVENTION PERTAINING TO THE HOME:  Any stairs in or around the home? Yes  If so, are there any without handrails? No  Home free of loose throw rugs in walkways, pet beds, electrical cords, etc? Yes  Adequate lighting  in your home to reduce risk of falls? Yes   ASSISTIVE DEVICES  UTILIZED TO PREVENT FALLS:  Life alert? No  Use of a cane, walker or w/c? No  Grab bars in the bathroom? No  Shower chair or bench in shower? No  Elevated toilet seat or a handicapped toilet? No   TIMED UP AND GO:  Was the test performed? No . Telephonic visit  Cognitive Function: Normal cognitive status assessed by direct observation by this Nurse Health Advisor. No abnormalities found.   MMSE - Mini Mental State Exam 07/05/2018 06/29/2017  Orientation to time 5 5  Orientation to Place 5 5  Registration 3 3  Attention/ Calculation 5 3  Recall 3 3  Language- name 2 objects 2 2  Language- repeat 1 1  Language- follow 3 step command 3 3  Language- read & follow direction 1 1  Write a sentence 1 1  Copy design 1 1  Total score 30 28     6CIT Screen 07/08/2019  What Year? 0 points  What month? 0 points  What time? 0 points  Count back from 20 0 points  Months in reverse 2 points  Repeat phrase 0 points  Total Score 2    Immunizations Immunization History  Administered Date(s) Administered   Pneumococcal Conjugate-13 01/07/2016   Pneumococcal Polysaccharide-23 06/29/2017    TDAP status: Due, Education has been provided regarding the importance of this vaccine. Advised may receive this vaccine at local pharmacy or Health Dept. Aware to provide a copy of the vaccination record if obtained from local pharmacy or Health Dept. Verbalized acceptance and understanding.  Flu Vaccine status: Declined, Education has been provided regarding the importance of this vaccine but patient still declined. Advised may receive this vaccine at local pharmacy or Health Dept. Aware to provide a copy of the vaccination record if obtained from local pharmacy or Health Dept. Verbalized acceptance and understanding.  Pneumococcal vaccine status: Up to date  Covid-19 vaccine status: Declined, Education has been provided regarding the importance of this vaccine but patient still declined. Advised may  receive this vaccine at local pharmacy or Health Dept.or vaccine clinic. Aware to provide a copy of the vaccination record if obtained from local pharmacy or Health Dept. Verbalized acceptance and understanding.  Qualifies for Shingles Vaccine? Yes   Zostavax completed No   Shingrix Completed?: No.    Education has been provided regarding the importance of this vaccine. Patient has been advised to call insurance company to determine out of pocket expense if they have not yet received this vaccine. Advised may also receive vaccine at local pharmacy or Health Dept. Verbalized acceptance and understanding.  Screening Tests Health Maintenance  Topic Date Due   INFLUENZA VACCINE  09/20/2021 (Originally 01/21/2021)   TETANUS/TDAP  03/20/2022 (Originally [birth year]/1969)   COVID-19 Vaccine (1) 03/31/2022 (Originally 06/16/1951)   Zoster Vaccines- Shingrix (1 of 2) 06/02/2022 (Originally 12/14/1969)   COLONOSCOPY (Pts 45-51yrs Insurance coverage will need to be confirmed)  01/20/2027   Pneumonia Vaccine 20+ Years old  Completed   Hepatitis C Screening  Completed   HPV VACCINES  Aged Out    Health Maintenance  There are no preventive care reminders to display for this patient.  Colorectal cancer screening: Type of screening: Colonoscopy. Completed 01/19/2017. Repeat every 10 years  Lung Cancer Screening: (Low Dose CT Chest recommended if Age 84-80 years, 30 pack-year currently smoking OR have quit w/in 15years.) does qualify.   Lung Cancer Screening Referral: last done  2018 - sent new referral order today  Additional Screening:  Hepatitis C Screening: does qualify; Completed 01/07/2016  Vision Screening: Recommended annual ophthalmology exams for early detection of glaucoma and other disorders of the eye. Is the patient up to date with their annual eye exam?  Yes  Who is the provider or what is the name of the office in which the patient attends annual eye exams? [place] If pt is not  established with a provider, would they like to be referred to a provider to establish care? No .   Dental Screening: Recommended annual dental exams for proper oral hygiene  Community Resource Referral / Chronic Care Management: CRR required this visit?  No   CCM required this visit?  Yes      Plan:     I have personally reviewed and noted the following in the patient's chart:   Medical and social history Use of alcohol, tobacco or illicit drugs  Current medications and supplements including opioid prescriptions. Patient is not currently taking opioid prescriptions. Functional ability and status Nutritional status Physical activity Advanced directives List of other physicians Hospitalizations, surgeries, and ER visits in previous 12 months Vitals Screenings to include cognitive, depression, and falls Referrals and appointments  In addition, I have reviewed and discussed with patient certain preventive protocols, quality metrics, and best practice recommendations. A written personalized care plan for preventive services as well as general preventive health recommendations were provided to patient.     Sandrea Hammond, LPN   02/72/5366   Nurse Notes: He doesn't want to keep paying $134 for his cholesterol medication - He is still working full time, so I doubt he will qualify for any assistance, but I sent referral to Pharmacist to see if any help is available.

## 2021-04-15 ENCOUNTER — Ambulatory Visit: Payer: Self-pay | Admitting: Surgery

## 2021-04-15 DIAGNOSIS — Z86010 Personal history of colonic polyps: Secondary | ICD-10-CM | POA: Diagnosis not present

## 2021-04-15 DIAGNOSIS — Z720 Tobacco use: Secondary | ICD-10-CM | POA: Diagnosis not present

## 2021-04-15 DIAGNOSIS — K8010 Calculus of gallbladder with chronic cholecystitis without obstruction: Secondary | ICD-10-CM | POA: Diagnosis not present

## 2021-04-17 ENCOUNTER — Telehealth: Payer: Self-pay | Admitting: Gastroenterology

## 2021-04-17 ENCOUNTER — Encounter: Payer: Self-pay | Admitting: *Deleted

## 2021-04-17 ENCOUNTER — Telehealth: Payer: Self-pay | Admitting: *Deleted

## 2021-04-17 NOTE — Telephone Encounter (Signed)
I s/w the pt and advised he will need an appt for pre op clearance. Pt was last seen by Dr. Percival Spanish 05/10/18. Pt states he has been doing well and that is why he has not been back to the office. I advised he will need the cardiology appt for pre op clearance. Pt is agreeable to plan of care. Appt has been made for 05/07/21 @ 10:15 with Caron Presume, PAC. Pt asked me if I could text him the information for the appt. I advised I cannot text it to him. Pt states he can never figure out how to get on MY CHART. Pt then asked me if I could send him an little note in the mail with the appt information. I said that will be fine. I did tell the pt that I will put him on a wait list for possible sooner appt. Pt thanked me for the call and the help. I will send FYI to requesting office pt has appt.

## 2021-04-17 NOTE — Telephone Encounter (Signed)
   Name: [name] [name]  DOB: Dec 16, 1950  MRN: [id number]  Primary Cardiologist: Minus Breeding, MD  Chart reviewed as part of pre-operative protocol coverage. Because of Duncan Alejandro Vath's past medical history and time since last visit, he will require a follow-up visit in order to better assess preoperative cardiovascular risk.  Last OV 04/2018 - has not been seen in almost 3 years so needs visit.  Pre-op covering staff: - Please schedule appointment and call patient to inform them.  - Please contact requesting surgeon's office via preferred method (i.e, phone, fax) to inform them of need for appointment prior to surgery.  No blood thinners listed on most recent MAR.  Charlie Pitter, PA-C  04/17/2021, 1:31 PM

## 2021-04-17 NOTE — Telephone Encounter (Signed)
Spoke with patient, he has been scheduled for a virtual pre-visit on Monday, 06/03/21 at 10:30 am. Pt is aware that he will receive a reminder call stating that it is an in person visit, advised pt to disregard. Patient is scheduled for a colonoscopy in the [facility] on Friday, 06/14/21 at 11 am. Will mail patient a letter with appt information. Pt verbalized understanding and had no concerns at the end of the call.

## 2021-04-17 NOTE — Telephone Encounter (Signed)
This patient was seen in 2018 and had some large polyps removed in piecemeal, one along the IC valve that was difficult to remove. I had recommended a follow up colonoscopy a few months later and the patient never came back to schedule. I received a note from Dr. [name] that the patient was seen there for a different issue but Dr. [name] noticed he was overdue for a colonoscopy and referred him back for his colonoscopy.   [place] can you book him for direct colonoscopy at the [facility] if he otherwise meets criteria and wants to proceed? Thanks

## 2021-04-17 NOTE — Telephone Encounter (Signed)
   [facility] Pre-operative Risk Assessment    Patient Name: [name] [name]  DOB: 07/09/1950 MRN: [id number]  HEARTCARE STAFF:  - IMPORTANT!!!!!! Under Visit Info/Reason for Call, type in Other and utilize the format Clearance MM/DD/YY or Clearance TBD. Do not use dashes or single digits. - Please review there is not already an duplicate clearance open for this procedure. - If request is for dental extraction, please clarify the # of teeth to be extracted. - If the patient is currently at the dentist's office, call Pre-Op Callback Staff (MA/nurse) to input urgent request.  - If the patient is not currently in the dentist office, please route to the Pre-Op pool.  Request for surgical clearance:  What type of surgery is being performed? Single site lap chole  When is this surgery scheduled? TBD  What type of clearance is required (medical clearance vs. Pharmacy clearance to hold med vs. Both)? medical  Are there any medications that need to be held prior to surgery and how long? none  Practice name and name of physician performing surgery? [facility]  What is the office phone number? [phone]   7.   What is the office fax number? [phone]  8.   Anesthesia type (None, local, MAC, general) ? general   Fredia Beets 04/17/2021, 1:11 PM  _________________________________________________________________   (provider comments below)

## 2021-04-25 ENCOUNTER — Telehealth: Payer: Self-pay

## 2021-04-25 NOTE — Chronic Care Management (AMB) (Signed)
  Chronic Care Management   Note  04/25/2021 Name: AUDEN TATAR MRN: [id number] DOB: [birth year]  YASMIN DIBELLO is a 70 y.o. year old male who is a primary care patient of Dettinger, Fransisca Kaufmann, MD. KADENCE MIMBS is currently enrolled in care management services. An additional referral for Pharm D  was placed.   Follow up plan: Telephone appointment with care management team member scheduled for:05/15/2021  Noreene Larsson, [address], [place], [place] [address] Direct Dial: [phone] [email or web address]: [email or web address]

## 2021-05-06 NOTE — Progress Notes (Addendum)
Cardiology Office Note:    Date:  05/07/2021   ID:  [name] [name], DOB 09-26-1950, MRN [id number]  PCP:  Dettinger, Fransisca Kaufmann, MD [place] Cardiologist: Minus Breeding, MD   Reason for visit: Preop clearance  History of Present Illness:    [name] [name] is a 70 y.o. male with a hx of CAD with occluded RCA treated medically, hypertension and history of tobacco use.  Today he states he is doing well and that he is just seeking preop clearance.  He states he has not followed up in last 3 years because he has had no problems.  Denies chest pain.  He has chronic dyspnea on exertion he relates to his smoking.  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  He still smokes 1.5 packs/day.  He denies palpitations, PND, orthopnea, lower extremity edema, lightheadedness and syncope.  He states he is not taking Nexlizet daily secondary to co-pay cost of $131 per month.  He takes it approximately every 2 to 3 days.      Past Medical History:  Diagnosis Date   Allergy    Anxiety    Patient denies   Arthritis    CAD (coronary artery disease)    The EF was 55% with inferior hypokinesis. Cath 09   Carotid stenosis    Coronary artery disease    left main had luminal irregularities, the LAD had 30% stenosis, the first diagonal had moderate-sized with ostial 35% stenosis, the circumflex in the AV groove had ostial 25% stenosis, the first obtuse marginal had long proximal 30% stenosis, the mid obtuse marginal was normal, the right coronary artery was dominant and occluded proximally.  There was excellent left to right collateral filling.      Depression    Patient denies   Diverticulitis    Dyslipidemia    Essential hypertension 08/24/2017   GERD (gastroesophageal reflux disease)    Lung nodule    benign   Osteoporosis    Tobacco abuse     Past Surgical History:  Procedure Laterality Date   CAROTID ENDARTERECTOMY Right    COLONOSCOPY     LEFT HEART  CATH AND CORONARY ANGIOGRAPHY N/A 08/04/2017   Procedure: LEFT HEART CATH AND CORONARY ANGIOGRAPHY;  Surgeon: Burnell Blanks, MD;  Location: [facility];  Service: Cardiovascular;  Laterality: N/A;   left inguinal herniorrhaphy     THROAT SURGERY      Current Medications: Current Meds  Medication Sig   albuterol (VENTOLIN HFA) 108 (90 Base) MCG/ACT inhaler INHALE 2 PUFFS EVERY 4 HOURS AS NEEDED FOR WHEEZING OR SHORTNESS OF BREATH   Bempedoic Acid-Ezetimibe (NEXLIZET) 180-10 MG TABS Take 1 tablet by mouth daily.   levothyroxine (SYNTHROID) 50 MCG tablet TAKE 1 TABLET DAILY   lisinopril (ZESTRIL) 20 MG tablet Take 1 tablet (20 mg total) by mouth daily.   lubiprostone (AMITIZA) 24 MCG capsule Take 1 capsule (24 mcg total) by mouth 2 (two) times daily with a meal.   omeprazole (PRILOSEC) 20 MG capsule Take 1 capsule (20 mg total) by mouth daily.   Probiotic Product (ALIGN) 4 MG CAPS Take by mouth.   SPIRIVA HANDIHALER 18 MCG inhalation capsule INHALE THE CONTENTS OF 1 CAPSULE ONCE DAILY     Allergies:   Crestor [rosuvastatin calcium], Flomax [tamsulosin hcl], and Lipitor [atorvastatin calcium]   Social History   Socioeconomic History   Marital status: Married    Spouse name: Stanton Kidney  Number of children: 4   Years of education: high school diploma   Highest education level: 12th grade  Occupational History   Occupation: truck Education administrator: [employer]  Tobacco Use   Smoking status: Every Day    Packs/day: 1.50    Years: 40.00    Pack years: 60.00    Types: Cigarettes   Smokeless tobacco: Never  Vaping Use   Vaping Use: Never used  Substance and Sexual Activity   Alcohol use: Yes    Alcohol/week: 1.0 standard drink    Types: 1 Shots of liquor per week   Drug use: No   Sexual activity: Yes    Birth control/protection: None  Other Topics Concern   Not on file  Social History Narrative   Lives with wife in a split level home.    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   Social Determinants of Health   Financial Resource Strain: Medium Risk   Difficulty of Paying Living Expenses: Somewhat hard  Food Insecurity: No Food Insecurity   Worried About Charity fundraiser in the Last Year: Never true   Ran Out of Food in the Last Year: Never true  Transportation Needs: No Transportation Needs   Lack of Transportation (Medical): No   Lack of Transportation (Non-Medical): No  Physical Activity: Sufficiently Active   Days of Exercise per Week: 5 days   Minutes of Exercise per Session: 60 min  Stress: No Stress Concern Present   Feeling of Stress : Not at all  Social Connections: Socially Integrated   Frequency of Communication with Friends and Family: More than three times a week   Frequency of Social Gatherings with Friends and Family: More than three times a week   Attends Religious Services: More than 4 times per year   Active Member of [employer] or Organizations: Yes   Attends Music therapist: More than 4 times per year   Marital Status: Married     Family History: The patient's family history includes Alcohol abuse in his brother; Cancer in his brother; Healthy in his daughter, son, son, and son; Lung disease in his brother, brother, and father; Lupus in his brother and mother. There is no history of Esophageal cancer, Rectal cancer, Stomach cancer, or Pancreatic cancer.  ROS:   Please see the history of present illness.     EKGs/Labs/Other Studies Reviewed:    EKG:  The ekg ordered today demonstrates sinus rhythm with occasional premature ventricular complexes, left axis deviation, heart rate 74, PR interval 146 ms, QRS duration 100 ms.  Recent Labs: 09/03/2020: ALT 17; BUN 16; Creatinine, Ser 1.21; Hemoglobin 15.9; Platelets 248; Potassium 5.6; Sodium 140 02/28/2021: TSH 2.170    Recent Lipid Panel Lab Results  Component Value Date/Time   CHOL 173 09/03/2020 09:50 AM   TRIG 103 09/03/2020 09:50 AM   HDL 50 09/03/2020 09:50 AM   LDLCALC 104 (H) 09/03/2020 09:50 AM    Physical Exam:    VS:  BP (!) 142/82 (BP Location: Left Arm, Patient Position: Sitting, Cuff Size: Normal)   Pulse 74   Resp 20   Ht 5\' 10"  (1.778 m)   Wt 147 lb 3.2 oz (66.8 kg)   SpO2 99%   BMI 21.12 kg/m    No data found.  Wt  Readings from Last 3 Encounters:  05/07/21 147 lb 3.2 oz (66.8 kg)  04/10/21 146 lb (66.2 kg)  03/20/21 146 lb (66.2 kg)     GEN:  Well nourished, well developed in no acute distress HEENT: Normal NECK: No JVD; + carotid bruits CARDIAC: RRR, no murmurs, rubs, gallops RESPIRATORY:  + Wheezing, no crackles ABDOMEN: Soft, non-tender, non-distended MUSCULOSKELETAL: No edema; No deformity  SKIN: Warm and dry NEUROLOGIC:  Alert and oriented PSYCHIATRIC:  Normal affect     ASSESSMENT AND PLAN    Preop clearance for gallbladder surgery Click Here to Calculate RCRI      :[id number]}   Mr. Friebel's perioperative risk of a major cardiac event is 0.9% according to the Revised Cardiac Risk Index (RCRI).  Therefore, he is at low risk for perioperative complications.   His functional capacity is excellent at 7.59 METs according to the [place] Activity Status Index (DASI). Recommendations: According to ACC/AHA guidelines, no further cardiovascular testing needed.  The patient may proceed to surgery at acceptable risk.   Antiplatelet and/or Anticoagulation Recommendations: He is not taking any blood thinners currently.  CAD, no angina -With known occluded RCA -Continue risk reduction.  Discussed tobacco cessation though pt is not interested at this time.   -Continue lipid management with our lipid pharmacist.  Hypertension, reasonably controlled -Systolic blood pressure 008Q to 140s. -Continue lisinopril.  Will check BMET today given history of  hyperkalemia. -Goal BP is <130/80.  Recommend DASH diet (high in vegetables, fruits, low-fat dairy products, whole grains, poultry, fish, and nuts and low in sweets, sugar-sweetened beverages, and red meats), salt restriction and increase physical activity.  Hyperlipidemia -LDL 104 in March 2022.  Continue Nexlizet.  Patient does not think he will qualify for patient assistance given income.  He has a appointment with our lipid pharmacist next week. -Discussed cholesterol lowering diets - Mediterranean diet, DASH diet, vegetarian diet, low-carbohydrate diet and avoidance of trans fats.  Discussed healthier choice substitutes.  Nuts, high-fiber foods, and fiber supplements may also improve lipids.    Tobacco use  -Recommend tobacco cessation.  Reviewed physiologic effects of nicotine and the immediate-eventual benefits of quitting including improvement in cough/breathing and reduction in cardiovascular events.  Discussed quitting tips such as removing triggers and getting support from family/friends and Quitline [place]. -Screen for AAA.   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 Recommend followup by ultrasound in 5 year --> we are past the 5 year mark -- will order repeat scan.  History of carotid stenosis -Previous R carotid endarterectomy -Last duplex in 2019.  With continued tobacco use, will recheck duplex.   Disposition - Follow-up in 1 year.         Medication Adjustments/Labs and Tests Ordered: Current medicines are reviewed at length with the patient today.  Concerns regarding medicines are outlined above.  Orders Placed This Encounter  Procedures   Basic metabolic panel   VAS [place] AAA DUPLEX   VAS US CAROTID   No orders of the defined types were placed in this encounter.   Patient Instructions  Medication Instructions:  Your physician recommends that you continue on your current medications as directed. Please refer to the Current  Medication list given to you today.  *If you need a refill on your cardiac medications before your next appointment, please call your pharmacy*  Lab Work: Your physician recommends that you return for lab work TODAY:  BMET If you have labs (blood work) drawn  today and your tests are completely normal, you will receive your results only by: MyChart Message (if you have MyChart) OR A paper copy in the mail If you have any lab test that is abnormal or we need to change your treatment, we will call you to review the results.  Testing/Procedures: Your physician has requested that you have a carotid duplex. This test is an ultrasound of the carotid arteries in your neck. It looks at blood flow through these arteries that supply the brain with blood. Allow one hour for this exam. There are no restrictions or special instructions.  Your physician has requested that you have an abdominal aorta duplex. During this test, an ultrasound is used to evaluate the aorta. Allow 30 minutes for this exam. Do not eat after midnight the day before and avoid carbonated beverages  Please schedule both within 3 months   Follow-Up: At [facility], you and your health needs are our priority.  As part of our continuing mission to provide you with exceptional heart care, we have created designated Provider Care Teams.  These Care Teams include your primary Cardiologist (physician) and Advanced Practice Providers (APPs -  Physician Assistants and Nurse Practitioners) who all work together to provide you with the care you need, when you need it.  Your next appointment:   1 year(s)  The format for your next appointment:   In Person  Provider:   Minus Breeding, MD {  Other Instructions    Signed, Gaston Islam  05/07/2021 10:59 AM    [facility]

## 2021-05-07 ENCOUNTER — Other Ambulatory Visit: Payer: Self-pay

## 2021-05-07 ENCOUNTER — Encounter: Payer: Self-pay | Admitting: Physician Assistant

## 2021-05-07 ENCOUNTER — Ambulatory Visit: Payer: PPO | Admitting: Physician Assistant

## 2021-05-07 VITALS — BP 142/82 | HR 74 | Resp 20 | Ht 70.0 in | Wt 147.2 lb

## 2021-05-07 DIAGNOSIS — I6523 Occlusion and stenosis of bilateral carotid arteries: Secondary | ICD-10-CM

## 2021-05-07 DIAGNOSIS — I2510 Atherosclerotic heart disease of native coronary artery without angina pectoris: Secondary | ICD-10-CM | POA: Diagnosis not present

## 2021-05-07 DIAGNOSIS — E782 Mixed hyperlipidemia: Secondary | ICD-10-CM | POA: Diagnosis not present

## 2021-05-07 DIAGNOSIS — Z01818 Encounter for other preprocedural examination: Secondary | ICD-10-CM

## 2021-05-07 DIAGNOSIS — F17200 Nicotine dependence, unspecified, uncomplicated: Secondary | ICD-10-CM | POA: Diagnosis not present

## 2021-05-07 DIAGNOSIS — I10 Essential (primary) hypertension: Secondary | ICD-10-CM | POA: Diagnosis not present

## 2021-05-07 DIAGNOSIS — E875 Hyperkalemia: Secondary | ICD-10-CM | POA: Diagnosis not present

## 2021-05-07 DIAGNOSIS — I77811 Abdominal aortic ectasia: Secondary | ICD-10-CM

## 2021-05-07 LAB — BASIC METABOLIC PANEL
BUN/Creatinine Ratio: 13 (ref 10–24)
BUN: 16 mg/dL (ref 8–27)
CO2: 25 mmol/L (ref 20–29)
Calcium: 9.7 mg/dL (ref 8.6–10.2)
Chloride: 102 mmol/L (ref 96–106)
Creatinine, Ser: 1.25 mg/dL (ref 0.76–1.27)
Glucose: 97 mg/dL (ref 70–99)
Potassium: 5.1 mmol/L (ref 3.5–5.2)
Sodium: 142 mmol/L (ref 134–144)
eGFR: 62 mL/min/{1.73_m2} (ref >59)

## 2021-05-07 NOTE — Patient Instructions (Signed)
Medication Instructions:  Your physician recommends that you continue on your current medications as directed. Please refer to the Current Medication list given to you today.  *If you need a refill on your cardiac medications before your next appointment, please call your pharmacy*  Lab Work: Your physician recommends that you return for lab work TODAY:  BMET If you have labs (blood work) drawn today and your tests are completely normal, you will receive your results only by: [place] (if you have MyChart) OR A paper copy in the mail If you have any lab test that is abnormal or we need to change your treatment, we will call you to review the results.  Testing/Procedures: Your physician has requested that you have a carotid duplex. This test is an ultrasound of the carotid arteries in your neck. It looks at blood flow through these arteries that supply the brain with blood. Allow one hour for this exam. There are no restrictions or special instructions.  Your physician has requested that you have an abdominal aorta duplex. During this test, an ultrasound is used to evaluate the aorta. Allow 30 minutes for this exam. Do not eat after midnight the day before and avoid carbonated beverages  Please schedule both within 3 months   Follow-Up: At [facility], you and your health needs are our priority.  As part of our continuing mission to provide you with exceptional heart care, we have created designated Provider Care Teams.  These Care Teams include your primary Cardiologist (physician) and Advanced Practice Providers (APPs -  Physician Assistants and Nurse Practitioners) who all work together to provide you with the care you need, when you need it.  Your next appointment:   1 year(s)  The format for your next appointment:   In Person  Provider:   Minus Breeding, MD {  Other Instructions

## 2021-05-07 NOTE — Addendum Note (Signed)
Addended by: Jacqulynn Cadet on: 05/07/2021 11:16 AM   Modules accepted: Orders

## 2021-05-07 NOTE — Addendum Note (Signed)
Addended by: Caron Presume on: 05/07/2021 11:23 AM   Modules accepted: Orders

## 2021-05-08 ENCOUNTER — Other Ambulatory Visit: Payer: Self-pay | Admitting: Family Medicine

## 2021-05-08 DIAGNOSIS — I10 Essential (primary) hypertension: Secondary | ICD-10-CM

## 2021-05-13 ENCOUNTER — Telehealth: Payer: Self-pay

## 2021-05-13 NOTE — Telephone Encounter (Addendum)
Spoke with patient regarding results.Patient advised results normal.----- Message from Warren Lacy, PA-C sent at 05/13/2021  9:46 AM EST ----- Potassium in normal range.  Kidney function and other electrolytes normal.  This is a good result before undergoing surgery.  I hope your gallbladder surgery goes well.

## 2021-05-15 ENCOUNTER — Ambulatory Visit (INDEPENDENT_AMBULATORY_CARE_PROVIDER_SITE_OTHER): Payer: PPO | Admitting: Pharmacist

## 2021-05-15 DIAGNOSIS — E782 Mixed hyperlipidemia: Secondary | ICD-10-CM

## 2021-05-15 DIAGNOSIS — G720 Drug-induced myopathy: Secondary | ICD-10-CM

## 2021-05-15 DIAGNOSIS — J449 Chronic obstructive pulmonary disease, unspecified: Secondary | ICD-10-CM

## 2021-05-15 NOTE — Patient Instructions (Addendum)
Visit Information  Thank you for taking time to visit with me today. Please don't hesitate to contact me if I can be of assistance to you before our next scheduled telephone appointment.  Following are the goals we discussed today:  Current Barriers:  Unable to independently afford treatment regimen Unable to maintain control of HLD  Pharmacist Clinical Goal(s):  patient will verbalize ability to afford treatment regimen maintain control of HLD as evidenced by [place]  through collaboration with PharmD and provider.   Interventions: 1:1 collaboration with Dettinger, Fransisca Kaufmann, MD regarding development and update of comprehensive plan of care as evidenced by provider attestation and co-signature Inter-disciplinary care team collaboration (see longitudinal plan of care) Comprehensive medication review performed; medication list updated in electronic medical record  Hyperlipidemia:  New goal. Uncontrolled; current treatment:NEZLIZET;  Copay >$100/month Patient tolerating therapy, denies side effects Only taking every 2-3 days due to cost Would likely be at goal if taking every day Medications previously tried: atorvastatin, rosuvastatin  hx of CAD with occluded RCA treated medically, hypertension and history of tobacco use (stable on spiriva-COPD) Current dietary patterns: heart healthy diet Mediterranean diet, DASH diet, low-carbohydrate diet and avoidance of trans fats  Assessed patient finances. Enrolled in [place] Lipid Panel     Component Value Date/Time   CHOL 173 09/03/2020 0950   TRIG 103 09/03/2020 0950   HDL 50 09/03/2020 0950   CHOLHDL 3.5 09/03/2020 0950   CHOLHDL 5.5 03/08/2013 0509   VLDL 22 03/08/2013 0509   LDLCALC 104 (H) 09/03/2020 0950   LABVLDL 19 09/03/2020 0950   Patient Goals/Self-Care Activities patient will:  - take medications as prescribed as evidenced by patient report and record review collaborate with  provider on medication access solutions engage in dietary modifications by [place] next appointment is by telephone in January 2023   Please call the care guide team at [phone] if you need to cancel or reschedule your appointment.   The patient verbalized understanding of instructions, educational materials, and care plan provided today and declined offer to receive copy of patient instructions, educational materials, and care plan.   Signature Regina Eck, PharmD, BCPS Clinical Pharmacist, [place]  II Phone [phone]

## 2021-05-15 NOTE — Progress Notes (Signed)
Chronic Care Management Pharmacy Note  05/15/2021 Name:  [name] [name] MRN:  [id number] DOB:  [birth year]  Summary: HLD  Recommendations/Changes made from today's visit: Hyperlipidemia:  New goal. Uncontrolled; current treatment:NEZLIZET;  Copay >$100/month Patient tolerating therapy, denies side effects Only taking every 2-3 days due to cost Would likely be at goal if taking every day Medications previously tried: atorvastatin, rosuvastatin  hx of CAD with occluded RCA treated medically, hypertension and history of tobacco use (stable on spiriva-COPD) Current dietary patterns: heart healthy diet Mediterranean diet, DASH diet, low-carbohydrate diet and avoidance of trans fats  Assessed patient finances. Enrolled in [place] Lipid Panel     Component Value Date/Time   CHOL 173 09/03/2020 0950   TRIG 103 09/03/2020 0950   HDL 50 09/03/2020 0950   CHOLHDL 3.5 09/03/2020 0950   CHOLHDL 5.5 03/08/2013 0509   VLDL 22 03/08/2013 0509   LDLCALC 104 (H) 09/03/2020 0950   LABVLDL 19 09/03/2020 0950   Patient Goals/Self-Care Activities patient will:  - take medications as prescribed as evidenced by patient report and record review collaborate with provider on medication access solutions engage in dietary modifications by HEART HEALTHY DIET  Subjective: [name] [name] is an 70 y.o. year old male who is a primary patient of Dettinger, Fransisca Kaufmann, MD.  The CCM team was consulted for assistance with disease management and care coordination needs.    Engaged with patient by telephone for initial visit in response to provider referral for pharmacy case management and/or care coordination services.   Consent to Services:  The patient was given information about Chronic Care Management services, agreed to services, and gave verbal consent prior to initiation of services.  Please see initial visit note for detailed documentation.   Patient Care Team: Dettinger,  Fransisca Kaufmann, MD as PCP - General (Family Medicine) Minus Breeding, MD as PCP - Cardiology (Cardiology) Minus Breeding, MD as Consulting Physician (Cardiology) Ilean China, RN as Registered Nurse Vika Buske, Royce Macadamia, [facility] (Pharmacist) Michael Boston, MD as Consulting Physician (General Surgery) Armbruster, Carlota Raspberry, MD as Consulting Physician (Gastroenterology)  Objective:  Lab Results  Component Value Date   CREATININE 1.25 05/07/2021   CREATININE 1.21 09/03/2020   CREATININE 1.19 03/09/2020    Lab Results  Component Value Date   HGBA1C 5.9 (H) 07/13/2017   Last diabetic Eye exam: No results found for: HMDIABEYEEXA  Last diabetic Foot exam: No results found for: HMDIABFOOTEX      Component Value Date/Time   CHOL 173 09/03/2020 0950   TRIG 103 09/03/2020 0950   HDL 50 09/03/2020 0950   CHOLHDL 3.5 09/03/2020 0950   CHOLHDL 5.5 03/08/2013 0509   VLDL 22 03/08/2013 0509   LDLCALC 104 (H) 09/03/2020 0950    Hepatic Function Latest Ref Rng & Units 09/03/2020 03/09/2020 11/04/2018  Total Protein 6.0 - 8.5 g/dL 7.4 7.4 7.4  Albumin 3.8 - 4.8 g/dL 4.4 4.7 4.4  AST 0 - 40 IU/L '24 20 18  ' ALT 0 - 44 IU/L '17 12 13  ' Alk Phosphatase 44 - 121 IU/L 111 125(H) 134(H)  Total Bilirubin 0.0 - 1.2 mg/dL 0.4 0.5 0.2  Bilirubin, Direct 0.00 - 0.40 mg/dL - - -    Lab Results  Component Value Date/Time   TSH 2.170 02/28/2021 04:09 PM   TSH 1.700 09/03/2020 09:50 AM   FREET4 1.02 08/06/2017 03:04 PM   FREET4 1.11 03/06/2015 09:31 AM    CBC Latest Ref Rng & Units 09/03/2020  03/09/2020 09/11/2019  WBC 3.4 - 10.8 x10E3/uL 7.9 10.7 10.3  Hemoglobin 13.0 - 17.7 g/dL 15.9 16.6 16.4  Hematocrit 37.5 - 51.0 % 46.5 48.6 48.8  Platelets 150 - 450 x10E3/uL 248 204 197    Lab Results  Component Value Date/Time   VD25OH 44.9 07/13/2017 09:30 AM    Clinical ASCVD: No  The 10-year ASCVD risk score (Arnett DK, et al., 2019) is: 27.5%   Values used to calculate the score:     Age: 70 years     Sex:  Male     Is Non-Hispanic African American: No     Diabetic: No     Tobacco smoker: Yes     Systolic Blood Pressure: 932 mmHg     Is BP treated: Yes     HDL Cholesterol: 50 mg/dL     Total Cholesterol: 173 mg/dL    Other: (CHADS2VASc if Afib, PHQ9 if depression, MMRC or CAT for COPD, ACT, DEXA)  Social History   Tobacco Use  Smoking Status Every Day   Packs/day: 1.50   Years: 40.00   Pack years: 60.00   Types: Cigarettes  Smokeless Tobacco Never   BP Readings from Last 3 Encounters:  05/07/21 (!) 142/82  03/20/21 137/69  02/28/21 137/73   Pulse Readings from Last 3 Encounters:  05/07/21 74  03/20/21 76  02/28/21 93   Wt Readings from Last 3 Encounters:  05/07/21 147 lb 3.2 oz (66.8 kg)  04/10/21 146 lb (66.2 kg)  03/20/21 146 lb (66.2 kg)    Assessment: Review of patient past medical history, allergies, medications, health status, including review of consultants reports, laboratory and other test data, was performed as part of comprehensive evaluation and provision of chronic care management services.   SDOH:  (Social Determinants of Health) assessments and interventions performed:    CCM Care Plan  Allergies  Allergen Reactions   Crestor [Rosuvastatin Calcium] Other (See Comments)    Lethargy and muscle aches   Flomax [Tamsulosin Hcl] Other (See Comments)    Made patient hypotensive, clammy, sweaty, and feel like he was going to pass out (EMS HAD TO BE CALLED)   Lipitor [Atorvastatin Calcium] Other (See Comments)    Body cramps, lethargy and muscle aches    Medications Reviewed Today     Reviewed by Lavera Guise, [facility] (Pharmacist) on 05/15/21 at 2047  Med List Status: <None>   Medication Order Taking? Sig Documenting Provider Last Dose Status Informant  albuterol (VENTOLIN HFA) 108 (90 Base) MCG/ACT inhaler [id number] No INHALE 2 PUFFS EVERY 4 HOURS AS NEEDED FOR WHEEZING OR SHORTNESS OF BREATH Dettinger, Fransisca Kaufmann, MD Taking Active   Bempedoic  Acid-Ezetimibe (NEXLIZET) 180-10 MG TABS [id number] No Take 1 tablet by mouth daily. Dettinger, Fransisca Kaufmann, MD Taking Active   levothyroxine (SYNTHROID) 50 MCG tablet [id number] No TAKE 1 TABLET DAILY Dettinger, Fransisca Kaufmann, MD Taking Active   lisinopril (ZESTRIL) 20 MG tablet [id number]  TAKE 1 TABLET DAILY Dettinger, Fransisca Kaufmann, MD  Active   lubiprostone (AMITIZA) 24 MCG capsule [id number] No Take 1 capsule (24 mcg total) by mouth 2 (two) times daily with a meal. Gwenlyn Perking, FNP Taking Active   nitroGLYCERIN (NITROSTAT) 0.4 MG SL tablet [id number]  Place 1 tablet (0.4 mg total) under the tongue every 5 (five) minutes as needed for chest pain. Dettinger, Fransisca Kaufmann, MD  Expired 12/02/20 2359   omeprazole (PRILOSEC) 20 MG capsule [id number] No Take 1 capsule (20 mg total) by mouth daily.  Dettinger, Fransisca Kaufmann, MD Taking Active   Probiotic Product (ALIGN) 4 MG CAPS [id number] No Take by mouth. [provider] Taking Active   SPIRIVA HANDIHALER 18 MCG inhalation capsule [id number] No INHALE THE CONTENTS OF 1 CAPSULE ONCE DAILY Dettinger, Fransisca Kaufmann, MD Taking Active             Patient Active Problem List   Diagnosis Date Noted   Drug-induced myopathy 03/19/2020   GERD (gastroesophageal reflux disease) 04/25/2019   COPD (chronic obstructive pulmonary disease) ([place]) 04/25/2019   Scrotal mass 10/30/2017   Coronary artery disease involving native coronary artery of native heart with angina pectoris ([place]) 08/24/2017   Essential hypertension 08/24/2017   Chronic idiopathic constipation 10/27/2016   Hypothyroidism 03/06/2015   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   HLD (hyperlipidemia) 04/19/2009   TOBACCO ABUSE 04/19/2009   Coronary atherosclerosis 04/19/2009   Occlusion and stenosis of carotid artery without mention of cerebral infarction 04/19/2009    Immunization History  Administered Date(s) Administered   Pneumococcal Conjugate-13 01/07/2016   Pneumococcal Polysaccharide-23 06/29/2017     Conditions to be addressed/monitored: HLD and COPD  Care Plan : PHARMD MEDICATION MANAGEMENT  Updates made by Lavera Guise, [place] since 05/15/2021 12:00 AM     Problem: DISEASE PROGRESSION PREVENTION      Long-Range Goal: HLD   This Visit's Progress: Not on track  Priority: High  Note:   Current Barriers:  Unable to independently afford treatment regimen Unable to maintain control of HLD  Pharmacist Clinical Goal(s):  patient will verbalize ability to afford treatment regimen maintain control of HLD as evidenced by [place]  through collaboration with PharmD and provider.   Interventions: 1:1 collaboration with Dettinger, Fransisca Kaufmann, MD regarding development and update of comprehensive plan of care as evidenced by provider attestation and co-signature Inter-disciplinary care team collaboration (see longitudinal plan of care) Comprehensive medication review performed; medication list updated in electronic medical record  Hyperlipidemia:  New goal. Uncontrolled; current treatment:NEZLIZET;  Copay >$100/month Patient tolerating therapy, denies side effects Only taking every 2-3 days due to cost Would likely be at goal if taking every day Medications previously tried: atorvastatin, rosuvastatin  hx of CAD with occluded RCA treated medically, hypertension and history of tobacco use (stable on spiriva-COPD) Current dietary patterns: heart healthy diet Mediterranean diet, DASH diet, low-carbohydrate diet and avoidance of trans fats  Assessed patient finances. Enrolled in [place] Lipid Panel     Component Value Date/Time   CHOL 173 09/03/2020 0950   TRIG 103 09/03/2020 0950   HDL 50 09/03/2020 0950   CHOLHDL 3.5 09/03/2020 0950   CHOLHDL 5.5 03/08/2013 0509   VLDL 22 03/08/2013 0509   LDLCALC 104 (H) 09/03/2020 0950   LABVLDL 19 09/03/2020 0950  Patient Goals/Self-Care Activities patient will:  - take medications as prescribed  as evidenced by patient report and record review collaborate with provider on medication access solutions engage in dietary modifications by HEART HEALTHY DIET      Medication Assistance:  [place]  Patient's preferred pharmacy is:  [facility], [place] [address] Phone: [phone] Fax: [phone]   Follow Up:  Patient agrees to Care Plan and Follow-up.  Plan: Telephone follow up appointment with care management team member scheduled for:  07/16/21  Regina Eck, PharmD, BCPS Clinical Pharmacist, [place]  II  Phone [phone]

## 2021-05-22 DIAGNOSIS — J449 Chronic obstructive pulmonary disease, unspecified: Secondary | ICD-10-CM

## 2021-05-22 DIAGNOSIS — E782 Mixed hyperlipidemia: Secondary | ICD-10-CM

## 2021-05-28 ENCOUNTER — Other Ambulatory Visit: Payer: Self-pay

## 2021-05-28 ENCOUNTER — Encounter (HOSPITAL_BASED_OUTPATIENT_CLINIC_OR_DEPARTMENT_OTHER): Payer: Self-pay | Admitting: Surgery

## 2021-05-28 DIAGNOSIS — F17210 Nicotine dependence, cigarettes, uncomplicated: Secondary | ICD-10-CM

## 2021-05-28 DIAGNOSIS — Z87891 Personal history of nicotine dependence: Secondary | ICD-10-CM

## 2021-05-28 NOTE — Progress Notes (Addendum)
Spoke w/ via phone for pre-op interview--- pt Lab needs dos----   [employer] results------  current ekg in epic/ chart COVID test -----patient states asymptomatic no test needed Arrive at ------- 0730 on 05-30-2021 NPO after MN NO Solid Food.  Clear liquids from MN until--- 0630 Med rec completed Medications to take morning of surgery ----- synthroid, spiriva inhaler Diabetic medication ----- n/a Patient instructed no nail polish to be worn day of surgery Patient instructed to bring photo id and insurance card day of surgery Patient aware to have Driver (ride ) / caregiver  for 24 hours after surgery -- wife, mary Patient Special Instructions ----- asked to bring rescue inhaler dos. Pt stated lives in [place] and unable to pick up pre-surgery ensure drink. Pre-Op special Istructions ----- pt has cardiac office visit clearance by Caron Presume PA on 05-07-2021 in epic/ chart Patient verbalized understanding of instructions that were given at this phone interview. Patient denies shortness of breath, chest pain, fever, cough at this phone interview.    Anesthesia Review: HTN:  severe one vessel CAD has been medically managed; s/p Right CEA without stroke;  COPD, (smokes 1.5ppd) pt stated last exacerbation one year ago, last used rescue inhaler yesterday for wheezing Pt denies cardiac s&s, no peripheral swelling, but does have DOE.  Stated has never used or needed nitro since given rx.   PCP: Dr Lenna Sciara. Dettinger (lov 03-20-2021 epic) Cardiologist : Dr Percival Spanish (lov 05-07-2021 epic) Chest x-ray : 04-29-2019 epic EKG : 05-07-2021 epic Echo : 03-04-2013 epic Stress test: no Carotid ultrasound:  06-18-2018 Cardiac Cath : 08-04-2017 epic Activity level:  DOE w/ activity Sleep Study/ CPAP : no Blood Thinner/ Instructions Maryjane Hurter Dose: no ASA / Instructions/ Last Dose :  no

## 2021-05-30 ENCOUNTER — Ambulatory Visit (HOSPITAL_COMMUNITY): Payer: PPO

## 2021-05-30 ENCOUNTER — Ambulatory Visit (HOSPITAL_BASED_OUTPATIENT_CLINIC_OR_DEPARTMENT_OTHER)
Admission: RE | Admit: 2021-05-30 | Discharge: 2021-05-30 | Disposition: A | Discharge status: Home / Self Care | Payer: PPO | Attending: Surgery | Admitting: Surgery

## 2021-05-30 ENCOUNTER — Encounter (HOSPITAL_BASED_OUTPATIENT_CLINIC_OR_DEPARTMENT_OTHER): Payer: Self-pay | Admitting: Surgery

## 2021-05-30 ENCOUNTER — Encounter (HOSPITAL_BASED_OUTPATIENT_CLINIC_OR_DEPARTMENT_OTHER)
Admission: RE | Disposition: A | Discharge status: Home / Self Care | Payer: Self-pay | Source: Home / Self Care | Attending: Surgery

## 2021-05-30 ENCOUNTER — Ambulatory Visit (HOSPITAL_BASED_OUTPATIENT_CLINIC_OR_DEPARTMENT_OTHER): Payer: PPO | Admitting: Anesthesiology

## 2021-05-30 DIAGNOSIS — E039 Hypothyroidism, unspecified: Secondary | ICD-10-CM | POA: Diagnosis not present

## 2021-05-30 DIAGNOSIS — I739 Peripheral vascular disease, unspecified: Secondary | ICD-10-CM | POA: Diagnosis not present

## 2021-05-30 DIAGNOSIS — K8010 Calculus of gallbladder with chronic cholecystitis without obstruction: Secondary | ICD-10-CM | POA: Diagnosis not present

## 2021-05-30 DIAGNOSIS — F17200 Nicotine dependence, unspecified, uncomplicated: Secondary | ICD-10-CM | POA: Diagnosis not present

## 2021-05-30 DIAGNOSIS — I2510 Atherosclerotic heart disease of native coronary artery without angina pectoris: Secondary | ICD-10-CM | POA: Insufficient documentation

## 2021-05-30 DIAGNOSIS — K7581 Nonalcoholic steatohepatitis (NASH): Secondary | ICD-10-CM | POA: Diagnosis not present

## 2021-05-30 DIAGNOSIS — Z79899 Other long term (current) drug therapy: Secondary | ICD-10-CM | POA: Insufficient documentation

## 2021-05-30 DIAGNOSIS — I10 Essential (primary) hypertension: Secondary | ICD-10-CM | POA: Diagnosis not present

## 2021-05-30 DIAGNOSIS — J449 Chronic obstructive pulmonary disease, unspecified: Secondary | ICD-10-CM | POA: Diagnosis not present

## 2021-05-30 DIAGNOSIS — K219 Gastro-esophageal reflux disease without esophagitis: Secondary | ICD-10-CM | POA: Insufficient documentation

## 2021-05-30 DIAGNOSIS — Z419 Encounter for procedure for purposes other than remedying health state, unspecified: Secondary | ICD-10-CM

## 2021-05-30 HISTORY — DX: Personal history of urinary calculi: Z87.442

## 2021-05-30 HISTORY — DX: Personal history of COVID-19: Z86.16

## 2021-05-30 HISTORY — DX: Complete loss of teeth, unspecified cause, unspecified class: Z97.2

## 2021-05-30 HISTORY — DX: Hypothyroidism, unspecified: E03.9

## 2021-05-30 HISTORY — DX: Personal history of adenomatous and serrated colon polyps: Z86.0101

## 2021-05-30 HISTORY — PX: LAPAROSCOPIC CHOLECYSTECTOMY SINGLE SITE WITH INTRAOPERATIVE CHOLANGIOGRAM: SHX6538

## 2021-05-30 HISTORY — DX: Other constipation: K59.09

## 2021-05-30 HISTORY — DX: Chronic obstructive pulmonary disease, unspecified: J44.9

## 2021-05-30 HISTORY — DX: Personal history of other diseases of the digestive system: Z87.19

## 2021-05-30 HISTORY — DX: Chronic cholecystitis: K81.1

## 2021-05-30 HISTORY — DX: Simple chronic bronchitis: J41.0

## 2021-05-30 HISTORY — DX: Other forms of dyspnea: R06.09

## 2021-05-30 HISTORY — DX: Personal history of colonic polyps: Z86.010

## 2021-05-30 HISTORY — DX: Complete loss of teeth, unspecified cause, unspecified class: K08.109

## 2021-05-30 HISTORY — DX: Presence of spectacles and contact lenses: Z97.3

## 2021-05-30 HISTORY — DX: Occlusion and stenosis of unspecified carotid artery: I65.29

## 2021-05-30 LAB — POCT I-STAT, CHEM 8
BUN: 26 mg/dL — ABNORMAL HIGH (ref 8–23)
Calcium, Ion: 1.24 mmol/L (ref 1.15–1.40)
Chloride: 103 mmol/L (ref 98–111)
Creatinine, Ser: 1.3 mg/dL — ABNORMAL HIGH (ref 0.61–1.24)
Glucose, Bld: 106 mg/dL — ABNORMAL HIGH (ref 70–99)
HCT: 50 % (ref 39.0–52.0)
Hemoglobin: 17 g/dL (ref 13.0–17.0)
Potassium: 4.1 mmol/L (ref 3.5–5.1)
Sodium: 141 mmol/L (ref 135–145)
TCO2: 27 mmol/L (ref 22–32)

## 2021-05-30 SURGERY — LAPAROSCOPIC CHOLECYSTECTOMY SINGLE SITE WITH INTRAOPERATIVE CHOLANGIOGRAM
Anesthesia: General | Site: Abdomen

## 2021-05-30 MED ORDER — CEFAZOLIN SODIUM-DEXTROSE 2-4 GM/100ML-% IV SOLN
2.0000 g | INTRAVENOUS | Status: AC
  Administered 2021-05-30: 2 g via INTRAVENOUS

## 2021-05-30 MED ORDER — PHENYLEPHRINE HCL (PRESSORS) 10 MG/ML IV SOLN
INTRAVENOUS | Status: DC | PRN
  Administered 2021-05-30: 80 ug via INTRAVENOUS

## 2021-05-30 MED ORDER — ACETAMINOPHEN 500 MG PO TABS
ORAL_TABLET | ORAL | Status: AC
  Filled 2021-05-30: qty 2

## 2021-05-30 MED ORDER — FENTANYL CITRATE (PF) 250 MCG/5ML IJ SOLN
INTRAMUSCULAR | Status: AC
  Filled 2021-05-30: qty 5

## 2021-05-30 MED ORDER — BUPIVACAINE LIPOSOME 1.3 % IJ SUSP: 20.0000 mL | Freq: Once | INTRAMUSCULAR | Status: DC

## 2021-05-30 MED ORDER — AMISULPRIDE (ANTIEMETIC) 5 MG/2ML IV SOLN: 10.0000 mg | Freq: Once | INTRAVENOUS | Status: DC | PRN

## 2021-05-30 MED ORDER — LACTATED RINGERS IV SOLN: INTRAVENOUS | Status: DC

## 2021-05-30 MED ORDER — BUPIVACAINE LIPOSOME 1.3 % IJ SUSP
INTRAMUSCULAR | Status: DC | PRN
  Administered 2021-05-30: 20 mL

## 2021-05-30 MED ORDER — CHLORHEXIDINE GLUCONATE CLOTH 2 % EX PADS: 6.0000 | MEDICATED_PAD | Freq: Once | CUTANEOUS | Status: DC

## 2021-05-30 MED ORDER — 0.9 % SODIUM CHLORIDE (POUR BTL) OPTIME
TOPICAL | Status: DC | PRN
  Administered 2021-05-30: 2000 mL

## 2021-05-30 MED ORDER — ONDANSETRON HCL 4 MG/2ML IJ SOLN
INTRAMUSCULAR | Status: DC | PRN
  Administered 2021-05-30: 4 mg via INTRAVENOUS

## 2021-05-30 MED ORDER — METRONIDAZOLE 500 MG/100ML IV SOLN
INTRAVENOUS | Status: AC
  Filled 2021-05-30: qty 100

## 2021-05-30 MED ORDER — GABAPENTIN 300 MG PO CAPS
300.0000 mg | ORAL_CAPSULE | ORAL | Status: AC
  Administered 2021-05-30: 300 mg via ORAL

## 2021-05-30 MED ORDER — KETOROLAC TROMETHAMINE 30 MG/ML IJ SOLN
INTRAMUSCULAR | Status: DC | PRN
  Administered 2021-05-30: 15 mg via INTRAVENOUS

## 2021-05-30 MED ORDER — ACETAMINOPHEN 500 MG PO TABS
1000.0000 mg | ORAL_TABLET | ORAL | Status: AC
  Administered 2021-05-30: 1000 mg via ORAL

## 2021-05-30 MED ORDER — CEFAZOLIN SODIUM-DEXTROSE 2-4 GM/100ML-% IV SOLN
INTRAVENOUS | Status: AC
  Filled 2021-05-30: qty 100

## 2021-05-30 MED ORDER — FENTANYL CITRATE (PF) 100 MCG/2ML IJ SOLN
INTRAMUSCULAR | Status: DC | PRN
  Administered 2021-05-30: 50 ug via INTRAVENOUS
  Administered 2021-05-30: 100 ug via INTRAVENOUS

## 2021-05-30 MED ORDER — ENSURE PRE-SURGERY PO LIQD: 296.0000 mL | Freq: Once | ORAL | Status: DC

## 2021-05-30 MED ORDER — SODIUM CHLORIDE 0.9 % IR SOLN
Status: DC | PRN
  Administered 2021-05-30: 3000 mL

## 2021-05-30 MED ORDER — FENTANYL CITRATE (PF) 100 MCG/2ML IJ SOLN: 25.0000 ug | INTRAMUSCULAR | Status: DC | PRN

## 2021-05-30 MED ORDER — ROCURONIUM BROMIDE 100 MG/10ML IV SOLN
INTRAVENOUS | Status: DC | PRN
  Administered 2021-05-30: 40 mg via INTRAVENOUS
  Administered 2021-05-30: 10 mg via INTRAVENOUS

## 2021-05-30 MED ORDER — METRONIDAZOLE 500 MG/100ML IV SOLN
500.0000 mg | INTRAVENOUS | Status: AC
  Administered 2021-05-30: 500 mg via INTRAVENOUS

## 2021-05-30 MED ORDER — PROPOFOL 10 MG/ML IV BOLUS
INTRAVENOUS | Status: DC | PRN
  Administered 2021-05-30: 40 mg via INTRAVENOUS
  Administered 2021-05-30: 120 mg via INTRAVENOUS

## 2021-05-30 MED ORDER — IOHEXOL 300 MG/ML  SOLN
INTRAMUSCULAR | Status: DC | PRN
  Administered 2021-05-30: 10 mL

## 2021-05-30 MED ORDER — TRAMADOL HCL 50 MG PO TABS: 50.0000 mg | ORAL_TABLET | Freq: Four times a day (QID) | ORAL | 0 refills | Status: DC | PRN

## 2021-05-30 MED ORDER — BUPIVACAINE-EPINEPHRINE 0.25% -1:200000 IJ SOLN
INTRAMUSCULAR | Status: DC | PRN
  Administered 2021-05-30: 30 mL

## 2021-05-30 MED ORDER — GABAPENTIN 300 MG PO CAPS
ORAL_CAPSULE | ORAL | Status: AC
  Filled 2021-05-30: qty 1

## 2021-05-30 MED ORDER — LIDOCAINE HCL (CARDIAC) PF 100 MG/5ML IV SOSY
PREFILLED_SYRINGE | INTRAVENOUS | Status: DC | PRN
  Administered 2021-05-30: 60 mg via INTRAVENOUS

## 2021-05-30 SURGICAL SUPPLY — 54 items
APL PRP STRL LF DISP 70% ISPRP (MISCELLANEOUS) ×2
APPLIER CLIP 5 13 M/L LIGAMAX5 (MISCELLANEOUS) ×3
APR CLP MED LRG 5 ANG JAW (MISCELLANEOUS) ×2
BAG SPEC RTRVL 10 TROC 200 (ENDOMECHANICALS) ×2
CABLE HIGH FREQUENCY MONO STRZ (ELECTRODE) ×3 IMPLANT
CHLORAPREP W/TINT 26 (MISCELLANEOUS) ×3 IMPLANT
CLIP APPLIE 5 13 M/L LIGAMAX5 (MISCELLANEOUS) ×2 IMPLANT
CNTNR URN SCR LID CUP LEK RST (MISCELLANEOUS) ×1 IMPLANT
CONT SPEC 4OZ STRL OR WHT (MISCELLANEOUS) ×3
COVER MAYO STAND STRL (DRAPES) IMPLANT
COVER SURGICAL LIGHT HANDLE (MISCELLANEOUS) ×2 IMPLANT
DECANTER SPIKE VIAL GLASS SM (MISCELLANEOUS) ×3 IMPLANT
DRAIN CHANNEL 19F RND (DRAIN) IMPLANT
DRAPE C-ARM 42X120 X-RAY (DRAPES) ×3 IMPLANT
DRAPE WARM FLUID 44X44 (DRAPES) ×3 IMPLANT
DRSG TEGADERM 4X4.75 (GAUZE/BANDAGES/DRESSINGS) ×3 IMPLANT
ELECT REM PT RETURN 9FT ADLT (ELECTROSURGICAL) ×3
ELECTRODE REM PT RTRN 9FT ADLT (ELECTROSURGICAL) ×2 IMPLANT
ENDOLOOP SUT PDS II  0 18 (SUTURE)
ENDOLOOP SUT PDS II 0 18 (SUTURE) IMPLANT
EVACUATOR SILICONE 100CC (DRAIN) IMPLANT
GAUZE 4X4 16PLY ~~LOC~~+RFID DBL (SPONGE) ×3 IMPLANT
GAUZE SPONGE 2X2 12PLY NS (GAUZE/BANDAGES/DRESSINGS) ×4 IMPLANT
GLOVE SRG 8 PF TXTR STRL LF DI (GLOVE) ×2 IMPLANT
GLOVE SURG LTX SZ8 (GLOVE) ×3 IMPLANT
GLOVE SURG UNDER POLY LF SZ8 (GLOVE) ×3
GOWN STRL REUS W/TWL XL LVL3 (GOWN DISPOSABLE) ×3 IMPLANT
IRRIG SUCT STRYKERFLOW 2 WTIP (MISCELLANEOUS) ×3
IRRIGATION SUCT STRKRFLW 2 WTP (MISCELLANEOUS) ×2 IMPLANT
KIT TURNOVER CYSTO (KITS) ×3 IMPLANT
NDL BIOPSY 14X6 SOFT TISS (NEEDLE) IMPLANT
NEEDLE BIOPSY 14X6 SOFT TISS (NEEDLE) IMPLANT
NEEDLE INSUFFLATION 120MM (ENDOMECHANICALS) IMPLANT
PACK BASIN DAY SURGERY FS (CUSTOM PROCEDURE TRAY) ×3 IMPLANT
PAD POSITIONING PINK XL (MISCELLANEOUS) ×3 IMPLANT
POUCH RETRIEVAL ECOSAC 10 (ENDOMECHANICALS) ×2 IMPLANT
POUCH RETRIEVAL ECOSAC 10MM (ENDOMECHANICALS) ×3
SCISSORS LAP 5X35 DISP (ENDOMECHANICALS) ×3 IMPLANT
SET CHOLANGIOGRAPH 5 50 .035 (SET/KITS/TRAYS/PACK) ×3 IMPLANT
SET CHOLANGIOGRAPH MIX (MISCELLANEOUS) ×3 IMPLANT
SET TUBE SMOKE EVAC HIGH FLOW (TUBING) ×3 IMPLANT
SHEARS HARMONIC ACE PLUS 36CM (ENDOMECHANICALS) ×2 IMPLANT
SPONGE GAUZE 2X2 8PLY STRL LF (GAUZE/BANDAGES/DRESSINGS) ×3 IMPLANT
SPONGE T-LAP 18X18 ~~LOC~~+RFID (SPONGE) IMPLANT
SUT MNCRL AB 4-0 PS2 18 (SUTURE) ×3 IMPLANT
SUT PDS AB 1 CT1 27 (SUTURE) ×10 IMPLANT
SUT VIC AB 2-0 UR6 27 (SUTURE) IMPLANT
SYR 20ML LL LF (SYRINGE) IMPLANT
TOWEL OR 17X26 10 PK STRL BLUE (TOWEL DISPOSABLE) ×3 IMPLANT
TRAY LAPAROSCOPIC (CUSTOM PROCEDURE TRAY) ×3 IMPLANT
TROCAR 5M 150ML BLDLS (TROCAR) ×3 IMPLANT
TROCAR BLADELESS OPT 5 100 (ENDOMECHANICALS) IMPLANT
TROCAR XCEL NON-BLD 11X100MML (ENDOMECHANICALS) IMPLANT
TROCAR Z-THREAD FIOS 5X100MM (TROCAR) ×3 IMPLANT

## 2021-05-30 NOTE — Interval H&P Note (Signed)
History and Physical Interval Note:  05/30/2021 9:22 AM  [name] [name]  has presented today for surgery, with the diagnosis of SYMPTOMATIC BILIARY COLIC, PORBABLE CHRONIC CHOLECYSTITIS.  The various methods of treatment have been discussed with the patient and family. After consideration of risks, benefits and other options for treatment, the patient has consented to  Procedure(s): [place] CHOLANGIOGRAM (N/A) POSSIBLE NEEDLE CORE LIVER BIOPSY (N/A) as a surgical intervention.  The patient's history has been reviewed, patient examined, no change in status, stable for surgery.  I have reviewed the patient's chart and labs.  Questions were answered to the patient's satisfaction.    I have re-reviewed the the patient's records, history, medications, and allergies.  I have re-examined the patient.  I again discussed intraoperative plans and goals of post-operative recovery.  The patient agrees to proceed.  [name] [name]  July 23, 1950 [id number]  Patient Care Team: Dettinger, Fransisca Kaufmann, MD as PCP - General (Family Medicine) Minus Breeding, MD as PCP - Cardiology (Cardiology) Minus Breeding, MD as Consulting Physician (Cardiology) Ilean China, RN as Registered Nurse Pruitt, Royce Macadamia, [facility] (Pharmacist) Michael Boston, MD as Consulting Physician (General Surgery) Armbruster, Carlota Raspberry, MD as Consulting Physician (Gastroenterology)  Patient Active Problem List   Diagnosis Date Noted   Drug-induced myopathy 03/19/2020   GERD (gastroesophageal reflux disease) 04/25/2019   COPD (chronic obstructive pulmonary disease) ([place]) 04/25/2019   Scrotal mass 10/30/2017   Coronary artery disease involving native coronary artery of native heart with angina pectoris ([place]) 08/24/2017   Essential hypertension 08/24/2017   Chronic idiopathic constipation 10/27/2016   Hypothyroidism 03/06/2015   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   HLD (hyperlipidemia)  04/19/2009   TOBACCO ABUSE 04/19/2009   Coronary atherosclerosis 04/19/2009   Occlusion and stenosis of carotid artery without mention of cerebral infarction 04/19/2009    Past Medical History:  Diagnosis Date   Arthritis    Carotid artery stenosis without cerebral infarction    vascular --- dr Scot Dock;   09-24-2007  s/p right CEA   Chronic cholecystitis    w/  large calcified stone in gallbladder   Chronic constipation    COPD (chronic obstructive pulmonary disease) ([place])    followed by pcp----  (05-28-2021  pt stated last used rescue 05-27-2021 for wheezing resolved after use, stated exacerbation approx  greater than a year ago)   Coronary artery disease 2002   cardiologist-- dr Percival Spanish ;  (05-28-2021  pt denies need to use nitro for CP) per last cath 08-04-2017  LM luminal irregularities, LAD 30% ,D1 ostial 35% ,CFx   AV groove ostial 25% , OM1 long prox 30%,  proxRCA occluded and dominant,   excellent left to right collateral filling.   Depression    Patient denies   DOE (dyspnea on exertion)    due to smoking/ copd   Dyslipidemia    Essential hypertension    Full dentures    GERD (gastroesophageal reflux disease)    History of adenomatous polyp of colon    History of COVID-19    per pt winter 2022, mild to moderate symptoms that resolved   History of diverticulitis of colon    History of kidney stones    Hypothyroidism    followed by pcp   Lung nodule    s/p bronchoscopy w/ bx in 2005,  benign   Osteoporosis    Smokers' cough ([place])    05-28-2021  pt stated occasionally productive w/ white phlegm   Wears glasses  Past Surgical History:  Procedure Laterality Date   BRONCHOSCOPY  2005   CARDIAC CATHETERIZATION  08/11/2000   @[facility]  by dr Lia Foyer;   preserved LVF,  ostial RCA 70-75%, other nonobstructive disease   CARDIAC CATHETERIZATION  03/30/2008   @[facility]  by dr hochrein;  occluded RCA and other nonobstructive involving LAD, CFx, D1,OM1  and left to right collateral  filling,  mild inferior hypokinesis,  ef 55%   CAROTID ENDARTERECTOMY Right 09/24/2007   @[facility]  by dr Scot Dock   COLONOSCOPY  01/19/2017   by armsbruster   EXCISION NEUROMA Left 10/01/2005   @[facility] ;  left inguinal   EXTRACORPOREAL SHOCK WAVE LITHOTRIPSY  2015   INGUINAL HERNIA REPAIR Left    01-13-2003 and 07-22-2006 both @[facility]    LAPAROSCOPIC INGUINAL HERNIA REPAIR Bilateral 04/28/2007   @[facility]    LEFT HEART CATH AND CORONARY ANGIOGRAPHY N/A 08/04/2017   Procedure: LEFT HEART CATH AND CORONARY ANGIOGRAPHY;  Surgeon: Burnell Blanks, MD;  Location: [facility];  Service: Cardiovascular;  Laterality: N/A;   THROAT SURGERY  1997   pt stated removal of tumor that was benign    Social History   Socioeconomic History   Marital status: Married    Spouse name: [name] [name]   Number of children: 4   Years of education: high school diploma   Highest education level: 12th grade  Occupational History   Occupation: truck Education administrator: [employer]  Tobacco Use   Smoking status: Every Day    Packs/day: 1.50    Years: 58.00    Pack years: 87.00    Types: Cigarettes   Smokeless tobacco: Never  Vaping Use   Vaping Use: Never used  Substance and Sexual Activity   Alcohol use: Not Currently    Comment: seldom   Drug use: No   Sexual activity: Yes    Birth control/protection: None  Other Topics Concern   Not on file  Social History Narrative   Lives with wife in a split level home.    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   Social Determinants of Health   Financial Resource Strain: Medium Risk   Difficulty of Paying Living Expenses: Somewhat hard  Food Insecurity: No Food Insecurity   Worried About Charity fundraiser in the Last Year: Never true   Ran Out of Food in the Last Year: Never true  Transportation Needs: No Transportation  Needs   Lack of Transportation (Medical): No   Lack of Transportation (Non-Medical): No  Physical Activity: Sufficiently Active   Days of Exercise per Week: 5 days   Minutes of Exercise per Session: 60 min  Stress: No Stress Concern Present   Feeling of Stress : Not at all  Social Connections: Socially Integrated   Frequency of Communication with Friends and Family: More than three times a week   Frequency of Social Gatherings with Friends and Family: More than three times a week   Attends Religious Services: More than 4 times per year   Active Member of [employer] or Organizations: Yes   Attends Music therapist: More than 4 times per year   Marital Status: Married  Human resources officer Violence: Not At Risk   Fear of Current or Ex-Partner: No   Emotionally Abused: No   Physically Abused: No   Sexually Abused: No  Family History  Problem Relation Age of Onset   Lung disease Father    Lupus Mother    Lupus Brother    Alcohol abuse Brother    Lung disease Brother    Cancer Brother        colon   Lung disease Brother    Healthy Daughter    Healthy Son    Healthy Son    Healthy Son    Esophageal cancer Neg Hx    Rectal cancer Neg Hx    Stomach cancer Neg Hx    Pancreatic cancer Neg Hx     Medications Prior to Admission  Medication Sig Dispense Refill Last Dose   albuterol (VENTOLIN HFA) 108 (90 Base) MCG/ACT inhaler INHALE 2 PUFFS EVERY 4 HOURS AS NEEDED FOR WHEEZING OR SHORTNESS OF BREATH (Patient taking differently: 2 puffs every 4 (four) hours as needed.) 18 g 5 05/29/2021   Bempedoic Acid-Ezetimibe (NEXLIZET) 180-10 MG TABS Take 1 tablet by mouth daily. (Patient taking differently: Take 1 tablet by mouth at bedtime.) 30 tablet 3 05/29/2021   levothyroxine (SYNTHROID) 50 MCG tablet TAKE 1 TABLET DAILY (Patient taking differently: Take 50 mcg by mouth daily before breakfast.) 90 tablet 3 05/30/2021 at 0430   lisinopril (ZESTRIL) 20 MG tablet TAKE 1 TABLET DAILY  (Patient taking differently: Take 20 mg by mouth at bedtime.) 90 tablet 1 05/29/2021   multivitamin (ONE-A-DAY MEN'S) TABS tablet Take 1 tablet by mouth daily.   05/29/2021   nitroGLYCERIN (NITROSTAT) 0.4 MG SL tablet Place 1 tablet (0.4 mg total) under the tongue every 5 (five) minutes as needed for chest pain. 25 tablet 3    omeprazole (PRILOSEC) 20 MG capsule Take 1 capsule (20 mg total) by mouth daily. (Patient taking differently: Take 20 mg by mouth at bedtime.) 90 capsule 3 05/29/2021   Probiotic Product (ALIGN) 4 MG CAPS Take 1 capsule by mouth daily.   05/29/2021   SPIRIVA HANDIHALER 18 MCG inhalation capsule INHALE THE CONTENTS OF 1 CAPSULE ONCE DAILY (Patient taking differently: 18 mcg daily.) 30 capsule 12 05/30/2021 at 0600   bisacodyl (DULCOLAX) 5 MG EC tablet Take 5 mg by mouth at bedtime.   More than a month   lubiprostone (AMITIZA) 24 MCG capsule Take 1 capsule (24 mcg total) by mouth 2 (two) times daily with a meal. 60 capsule 0 Unknown    Current Facility-Administered Medications  Medication Dose Route Frequency Provider Last Rate Last Admin   bupivacaine liposome (EXPAREL) 1.3 % injection 266 mg  20 mL Infiltration Once Michael Boston, MD       ceFAZolin (ANCEF) IVPB 2g/100 mL premix  2 g Intravenous On Call to OR Michael Boston, MD       And   metroNIDAZOLE (FLAGYL) IVPB 500 mg  500 mg Intravenous On Call to OR Michael Boston, MD       Chlorhexidine Gluconate Cloth 2 % PADS 6 each  6 each Topical Once Michael Boston, MD       And   Chlorhexidine Gluconate Cloth 2 % PADS 6 each  6 each Topical Once Michael Boston, MD       Derrill Memo ON 05/31/2021] feeding supplement (ENSURE PRE-SURGERY) liquid 296 mL  296 mL Oral Once Michael Boston, MD       lactated ringers infusion   Intravenous Continuous Effie Berkshire, MD 50 mL/hr at 05/30/21 0813 New Bag at 05/30/21 0813     Allergies  Allergen Reactions   Crestor [Rosuvastatin Calcium] Other (See  Comments)    Lethargy and muscle aches   Flomax  [Tamsulosin Hcl] Other (See Comments)    Made patient hypotensive, clammy, sweaty, and feel like he was going to pass out (EMS HAD TO BE CALLED)   Lipitor [Atorvastatin Calcium] Other (See Comments)    Body cramps, lethargy and muscle aches    BP (!) 160/69   Pulse 85   Temp 97.6 F (36.4 C) (Oral)   Resp 17   Ht 5\' 10"  (1.778 m)   Wt 65.2 kg   SpO2 97%   BMI 20.62 kg/m   Labs: Results for orders placed or performed during the hospital encounter of 05/30/21 (from the past 48 hour(s))  I-STAT, chem 8     Status: Abnormal   Collection Time: 05/30/21  8:10 AM  Result Value Ref Range   Sodium 141 135 - 145 mmol/L   Potassium 4.1 3.5 - 5.1 mmol/L   Chloride 103 98 - 111 mmol/L   BUN 26 (H) 8 - 23 mg/dL   Creatinine, Ser 1.30 (H) 0.61 - 1.24 mg/dL   Glucose, Bld 106 (H) 70 - 99 mg/dL    Comment: Glucose reference range applies only to samples taken after fasting for at least 8 hours.   Calcium, Ion 1.24 1.15 - 1.40 mmol/L   TCO2 27 22 - 32 mmol/L   Hemoglobin 17.0 13.0 - 17.0 g/dL   HCT 50.0 39.0 - 52.0 %    Imaging / Studies: No results found.   Adin Hector, M.D., F.A.C.S. Gastrointestinal and Minimally Invasive Surgery [facility] [address] [place], [place] [address] [phone] Main / Paging  05/30/2021 9:22 AM    Adin Hector

## 2021-05-30 NOTE — Anesthesia Postprocedure Evaluation (Signed)
Anesthesia Post Note  Patient: [name] [name]  Procedure(s) Performed: LAPAROSCOPIC CHOLECYSTECTOMY SINGLE SITE WITH INTRAOPERATIVE CHOLANGIOGRAM (Abdomen)     Patient location during evaluation: PACU Anesthesia Type: General Level of consciousness: awake and alert Pain management: pain level controlled Vital Signs Assessment: post-procedure vital signs reviewed and stable Respiratory status: spontaneous breathing, nonlabored ventilation, respiratory function stable and patient connected to nasal cannula oxygen Cardiovascular status: blood pressure returned to baseline and stable Postop Assessment: no apparent nausea or vomiting Anesthetic complications: no   No notable events documented.  Last Vitals:  Vitals:   05/30/21 1130 05/30/21 1245  BP: 137/68 123/61  Pulse: 63 65  Resp: 17 18  Temp:    SpO2: 96% 98%    Last Pain:  Vitals:   05/30/21 1130  TempSrc:   PainSc: 0-No pain                 Tiajuana Amass

## 2021-05-30 NOTE — Op Note (Signed)
05/30/2021  PATIENT:  [name] [name]  70 y.o. male  Patient Care Team: Dettinger, Fransisca Kaufmann, MD as PCP - General (Family Medicine) Minus Breeding, MD as PCP - Cardiology (Cardiology) Minus Breeding, MD as Consulting Physician (Cardiology) Ilean China, RN as Registered Nurse Pruitt, Royce Macadamia, [facility] (Pharmacist) Michael Boston, MD as Consulting Physician (General Surgery) Armbruster, Carlota Raspberry, MD as Consulting Physician (Gastroenterology)  PRE-OPERATIVE DIAGNOSIS:    Chronic Calculus cholecystitis Large gallstone calculus  POST-OPERATIVE DIAGNOSIS:   Chronic Calculus cholecystitis Large gallstone calculus Liver: Fatty steatohepatitis  PROCEDURE:  SINGLE SITE Laparoscopic cholecystectomy with intraoperative cholangiogram (CPT code [phone])  SURGEON:  Adin Hector, MD, FACS.  ASSISTANT: OR Staff   ANESTHESIA:    General with endotracheal intubation Local anesthetic as a field block  EBL:  (See Anesthesia Intraoperative Record) No intake/output data recorded.  Delay start of Pharmacological VTE agent (>24hrs) due to surgical blood loss or risk of bleeding:  no  DRAINS: None   SPECIMEN: Gallbladder    DISPOSITION OF SPECIMEN:  PATHOLOGY  COUNTS:  YES  PLAN OF CARE: Discharge to home after PACU  PATIENT DISPOSITION:  PACU - hemodynamically stable.  INDICATION: Pleasant gentleman with some abdominal complaints.  Has had x-ray films confirming a large calcified stone in the right upper quadrant with localization confirming within the gallbladder.  Given its large size and some abdominal issues I recommended cholecystectomy  The anatomy & physiology of hepatobiliary & pancreatic function was discussed.  The pathophysiology of gallbladder dysfunction was discussed.  Natural history risks without surgery was discussed.   I feel the risks of no intervention will lead to serious problems that outweigh the operative risks; therefore, I recommended cholecystectomy to remove  the pathology.  I explained laparoscopic techniques with possible need for an open approach.  Probable cholangiogram to evaluate the bilary tract was explained as well.    Risks such as bleeding, infection, abscess, leak, injury to other organs, need for further treatment, heart attack, death, and other risks were discussed.  I noted a good likelihood this will help address the problem.  Possibility that this will not correct all abdominal symptoms was explained.  Goals of post-operative recovery were discussed as well.  We will work to minimize complications.  An educational handout further explaining the pathology and treatment options was given as well.  Questions were answered.  The patient expresses understanding & wishes to proceed with surgery.  OR FINDINGS: Distended gallbladder with thickening changes and mild edema and adhesions consistent with chronic cholecystitis.  3 cm spherical stone noted.  Very narrowed cystic duct.  Cholangiogram underwhelming with no obstruction leak or abscess.  Liver: Mild fatty change on the liver.  Nothing of high suspicion.  I held off on any biopsies.  DESCRIPTION:   The patient was identified & brought in the operating room. The patient was positioned supine with arms tucked. SCDs were active during the entire case. The patient underwent general anesthesia without any difficulty.  The abdomen was prepped and draped in a sterile fashion. A Surgical Timeout confirmed our plan.  I made a transverse curvilinear incision through the superior umbilical fold.  I placed a 25mm long port through the supraumbilical fascia using a modified Hassan cutdown technique with umbilical stalk fascial countertraction. I began carbon dioxide insufflation.  No change in end tidal CO2 measurement.   Camera inspection revealed no injury. There were no adhesions to the anterior abdominal wall supraumbilically.  I proceeded to continue with single  site technique. I placed a #5 port in  left upper aspect of the wound. I placed a 5 mm atraumatic grasper in the right inferior aspect of the wound.  I turned attention to the right upper quadrant.  Some greater omental mesocolon adhesions noted to a distended gallbladder was somewhat thickened with changes consistent with chronic cholecystitis.  The gallbladder fundus was elevated cephalad. I freed adhesions to the ventral surface of the gallbladder off carefully.  I freed the peritoneal coverings between the gallbladder and the liver on the posteriolateral and anteriomedial walls. I alternated between Harmonic & blunt Maryland dissection to help get a good critical view of the cystic artery and cystic duct.  did further dissection to free 80%of the gallbladder off the liver bed to get a good critical view of the infundibulum and cystic duct. I dissected out the cystic artery; and, after getting a good 360 view, ligated the anterior & posterior branches of the cystic artery close on the infundibulum using the Harmonic ultrasonic dissection.  I skeletonized the cystic duct.  I placed a clip on the infundibulum. I did a partial cystic duct-otomy and ensured patency. I placed a 5 [place] cholangiocatheter through a puncture site at the right subcostal ridge of the abdominal wall and directed it into the cystic duct.  We ran a cholangiogram with dilute radio-opaque contrast and continuous fluoroscopy. Contrast flowed from a side branch consistent with cystic duct cannulization. Contrast flowed up the common hepatic duct into the right and left intrahepatic chains out to secondary radicals. Contrast flowed down the common bile duct easily across the normal ampulla into the duodenum.  This was consistent with a normal cholangiogram.  I removed the cholangiocatheter. I placed clips on the cystic duct x4.  I completed cystic duct transection. I freed the gallbladder from its remaining attachments to the liver. I ensured hemostasis on the gallbladder fossa  of the liver and elsewhere. I inspected the rest of the abdomen & detected no injury nor bleeding elsewhere.  I removed the gallbladder out the supraumbilical fascia. I closed the fascia transversely using #1 PDS interrupted stitches. I closed the skin using 4-0 monocryl stitch.  Sterile dressing was applied. The patient was extubated & arrived in the PACU in stable condition..  I had discussed postoperative care with the patient in the holding area. I discussed operative findings, updated the patient's status, discussed probable steps to recovery, and gave postoperative recommendations to the patient's spouse, Stanton Kidney  Recommendations were made.  Questions were answered.  She expressed understanding & appreciation.  Adin Hector, M.D., F.A.C.S. Gastrointestinal and Minimally Invasive Surgery [facility] [address], [place], [place] [address] [phone] Main / Paging  05/30/2021 10:47 AM

## 2021-05-30 NOTE — H&P (Signed)
05/30/2021     REFERRING PHYSICIAN: Dettinger, Fransisca Kaufmann, MD  Patient Care Team: Dettinger, Fransisca Kaufmann, MD as PCP - General (Family Medicine) [name], Adrian Saran, MD as Consulting Provider (General Surgery) Armbruster, Renelda Loma, MD (Gastroenterology) Minus Breeding, MD (Cardiovascular Disease)  PROVIDER: Hollace Kinnier, MD  [facility] MRN: [id number] DOB: [birth year] DATE OF ENCOUNTER: 04/15/2021  Subjective   Chief Complaint: " They told me I have a stone the size of my fist in my intestine!"  History of Present Illness: [name] [name] is a 70 y.o. male who is seen today as an office consultation at the request of Dr. Warrick Parisian for evaluation of calcification  Active smoking male. I had fixed a recurrent recurrent inguinal hernia in lap TEP approach 2008. Have not seen him since. Sounds like he has had a gallstone calcified noted for quite some time. Has not really had any symptoms. Had some mild heartburn and reflux for which over-the-counter medicines work. On omeprazole. However he has noticed bloating when he eats. Persistent. Went to his primary care office. They wondered if he had some constipation issues so it sounds like they put him on some Colace. He did not feel much better. They got an x-ray which confirmed a calcified mass in the right upper quadrant. Seem to correlate with his 2014 CAT scan. Because he was getting symptoms, surgical consultation offered.  Patient comes today with his wife. They were certain that there was a stone in his intestine. He still smokes. He can walk half hour without difficulty. He did have a diagnosis of coronary disease followed by Dr. Orlean Patten and with [facility] gastroenterology. As far as I can gather, he has not required any stenting. Usually tends to be constipated. Takes a stool softener to move his bowels most days if he can. He does have a history of adenomatous polyps. Had a colonoscopy in 2018 by Dr. Havery Moros. I guess there was concern  about an area in the ileocecal valve and 42-month follow-up recommended but that did not happen. Patient has not seen GI in the past 4 years. Denies any rectal bleeding. Appetites been otherwise okay. No fevers or chills or weight loss. Denies any abdominal surgery aside from his inguinal hernias  Medical History: Past Medical History:  Diagnosis Date   Anemia   GERD (gastroesophageal reflux disease)   Thyroid disease   There is no problem list on file for this patient.  Past Surgical History:  Procedure Laterality Date   HERNIA REPAIR    Allergies  Allergen Reactions   Atorvastatin Calcium Other (See Comments)  Body cramps, lethargy and muscle aches   Rosuvastatin Calcium Other (See Comments)  Lethargy and muscle aches   Tamsulosin Hcl Other (See Comments)  Made patient hypotensive, clammy, sweaty, and feel like he was going to pass out (EMS HAD TO BE CALLED)   Current Outpatient Medications on File Prior to Visit  Medication Sig Dispense Refill   levothyroxine (SYNTHROID) 50 MCG tablet Take 1 tablet by mouth once daily   omeprazole (PRILOSEC) 20 MG DR capsule Take 1 capsule by mouth once daily   NEXLIZET 180-10 mg Tab Take 1 tablet by mouth once daily   SPIRIVA WITH HANDIHALER 18 mcg inhalation capsule INHALE THE CONTENTS OF 1 CAPSULE ONCE DAILY   No current facility-administered medications on file prior to visit.   Family History  Problem Relation Age of Onset   Colon cancer Brother    Social History   Tobacco Use  Smoking Status Former  Smoker  Smokeless Tobacco Never Used    Social History   Socioeconomic History   Marital status: Married  Tobacco Use   Smoking status: Former Smoker   Smokeless tobacco: Never Used  Substance and Sexual Activity   Alcohol use: Never   Drug use: Never   ############################################################  Fraility Risk:  Preoperative Risks/Screening 1. Frailty Review:  Lives independently? yes Uses a mobility  assist device (cane/walker/wheelchair)? no History of falls within 3 months? no Cognitive impairment/dementia? no Age > 65? yes  2. Nutrition Screening: Cancer/IBD? no Age >65? yes Weight loss >10% in past 6 months? yes If yes to any of the 3 above, consider Impact AR supplemental shake  3. PONV Screening: History of PONV? no Male under age of 40? no History of motion sickness? no  4. Chronic pain issues? no  5. Diabetes? no Last HgbA1c: No results found for: HGBA1C  Review of Systems: A complete review of systems (ROS) was obtained from the patient. I have reviewed this information and discussed as appropriate with the patient. See HPI as well for other pertinent ROS.  Constitutional: No fevers, chills, sweats. Weight stable Eyes: No vision changes, No discharge HENT: No sore throats, nasal drainage Lymph: No neck swelling, No bruising easily Pulmonary: No cough, productive sputum CV: No orthopnea, PND Patient walks 60 minutes for about 3 miles without difficulty. No exertional chest/neck/shoulder/arm pain.  GI: No personal nor family history of GI/colon cancer, inflammatory bowel disease, irritable bowel syndrome, allergy such as Celiac Sprue, dietary/dairy problems, colitis, ulcers nor gastritis. No recent sick contacts/gastroenteritis. No travel outside the country. No changes in diet. AS PER HPI -positive for adenomatous colon polyps in 2018.  Renal: No UTIs, No hematuria Genital: No drainage, bleeding, masses Musculoskeletal: No severe joint pain. Good ROM major joints Skin: No sores or lesions Heme/Lymph: No easy bleeding. No swollen lymph nodes  Objective:   Vitals:  04/15/21 1114  BP: 122/70  Pulse: 95  Temp: 36.7 C (98 F)  SpO2: 98%  Weight: 65.7 kg (144 lb 12.8 oz)  Height: 177.8 cm (5\' 10" )    Body mass index is 20.78 kg/m.  PHYSICAL EXAM:  Constitutional: Not cachectic. Hygeine adequate. Vitals signs as above.  Eyes: Pupils reactive, normal  extraocular movements. Sclera nonicteric Neuro: CN II-XII intact. No major focal sensory defects. No major motor deficits. Lymph: No head/neck/groin lymphadenopathy Psych: No severe agitation. No severe anxiety. Judgment & insight Adequate, Oriented x4, HENT: Normocephalic, Mucus membranes moist. No thrush.  Neck: Supple, No tracheal deviation. No obvious thyromegaly Chest: No pain to chest wall compression. Good respiratory excursion. No audible wheezing CV: Pulses intact. Regular rhythm. No major extremity edema  Abdomen: Flat Hernia: Not present. Diastasis recti: Not present. Soft. Nondistended. Nontender. No hepatomegaly. No splenomegaly  Gen: Inguinal hernia: Not present. Inguinal lymph nodes: without lymphadenopathy.   Rectal: (Deferred)  Ext: No obvious deformity or contracture. Edema: Not present. No cyanosis Skin: No major subcutaneous nodules. Warm and dry Musculoskeletal: Severe joint rigidity not present. No obvious clubbing. No digital petechiae.   Labs, Imaging and Diagnostic Testing:  Located in [place]' section of Epic EMR chart  PRIOR NOTES   Not applicable  SURGERY NOTES:  None  PATHOLOGY:  Located in [place]' section of Epic EMR chart  Assessment and Plan:  DIAGNOSES:  Diagnoses and all orders for this visit:  Calculus of gallbladder with chronic cholecystitis without obstruction  History of adenomatous polyp of colon  Tobacco abuse disorder  ASSESSMENT/PLAN  Pleasant smoking male that is actually rather active who has postprandial bloating and known gallstone. It is a large calcified gallstone that has been there for the past 8 years. I showed the x-ray pictures on my computer to the patient and his wife to confirm that he had a stone for that long and it is always been in his gallbladder. May be slightly larger. Given the large calcified stone, I would recommend cholecystectomy. I would start with a single site approach with  low threshold to do a four-port. Hopefully need for core biopsy of the liver low. Possible cholangiogram. He is interested in proceeding.   The anatomy & physiology of hepatobiliary & pancreatic function was discussed. The pathophysiology of gallbladder dysfunction was discussed. Natural history risks without surgery was discussed. I feel the risks of no intervention will lead to serious problems that outweigh the operative risks; therefore, I recommended cholecystectomy to remove the pathology. I explained laparoscopic techniques with possible need for an open approach. Probable cholangiogram to evaluate the bilary tract was explained as well. Possible need for a needle core biopsy if liver changes seen or history of abnormal liver labs  Risks such as bleeding, infection, diarrhea and other bowel changes, abscess, leak, injury to other organs, need for repair of tissues / organs, need for further treatment, stroke, heart attack, death, and other risks were discussed. I noted a good likelihood this will help address the problem, but there is a chance it may not help. Possibility that this will not correct all abdominal symptoms was explained. Goals of post-operative recovery were discussed as well. We will work to minimize complications. An educational handout further explaining the pathology and treatment options was given as well. Questions were answered. The patient expresses understanding & wishes to proceed with surgery.      Adin Hector, MD, FACS, MASCRS Esophageal, Gastrointestinal & Colorectal Surgery Robotic and Minimally Invasive Surgery  [facility], [place]  [place]. [address] [place], [place] [address] [phone] Fax [phone] Main  CONTACT INFORMATION:  Weekday (9AM-5PM): Call [facility] main office at [phone]  Weeknight (5PM-9AM) or Weekend/Holiday: Check [email or web address] (password " [email or web address]") for General  Surgery [facility] coverage  (Please, do not use SecureChat as it is not reliable communication to operating surgeons for immediate patient care)      05/30/2021

## 2021-05-30 NOTE — Transfer of Care (Signed)
Immediate Anesthesia Transfer of Care Note  Patient: JAQUES MINEER  Procedure(s) Performed: LAPAROSCOPIC CHOLECYSTECTOMY SINGLE SITE WITH INTRAOPERATIVE CHOLANGIOGRAM (Abdomen)  Patient Location: PACU  Anesthesia Type:General  Level of Consciousness: awake, alert , oriented and patient cooperative  Airway & Oxygen Therapy: Patient Spontanous Breathing and Patient connected to face mask oxygen  Post-op Assessment: Report given to RN and Post -op Vital signs reviewed and stable  Post vital signs: Reviewed and stable  Last Vitals:  Vitals Value Taken Time  BP 87/60 05/30/21 1100  Temp    Pulse 64 05/30/21 1109  Resp 18 05/30/21 1109  SpO2 100 % 05/30/21 1109  Vitals shown include unvalidated device data.  Last Pain:  Vitals:   05/30/21 0753  TempSrc: Oral  PainSc: 0-No pain      Patients Stated Pain Goal: 5 (01/41/03 0131)  Complications: No notable events documented.

## 2021-05-30 NOTE — Discharge Instructions (Addendum)
################################################################  LAPAROSCOPIC SURGERY: POST OP INSTRUCTIONS  ######################################################################  EAT Gradually transition to a high fiber diet with a fiber supplement over the next few weeks after discharge.  Start with a pureed / full liquid diet (see below)  WALK Walk an hour a day.  Control your pain to do that.    CONTROL PAIN Control pain so that you can walk, sleep, tolerate sneezing/coughing, go up/down stairs.  HAVE A BOWEL MOVEMENT DAILY Keep your bowels regular to avoid problems.  OK to try a laxative to override constipation.  OK to use an antidairrheal to slow down diarrhea.  Call if not better after 2 tries  CALL IF YOU HAVE PROBLEMS/CONCERNS Call if you are still struggling despite following these instructions. Call if you have concerns not answered by these instructions  ######################################################################    DIET: Follow a light bland diet & liquids the first 24 hours after arrival home, such as soup, liquids, starches, etc.  Be sure to drink plenty of fluids.  Quickly advance to a usual solid diet within a few days.  Avoid fast food or heavy meals as your are more likely to get nauseated or have irregular bowels.  A low-fat, high-fiber diet for the rest of your life is ideal.  Take your usually prescribed home medications unless otherwise directed.  PAIN CONTROL: Pain is best controlled by a usual combination of three different methods TOGETHER: Ice/Heat Over the counter pain medication Prescription pain medication Most patients will experience some swelling and bruising around the incisions.  Ice packs or heating pads (30-60 minutes up to 6 times a day) will help. Use ice for the first few days to help decrease swelling and bruising, then switch to heat to help relax tight/sore spots and speed recovery.  Some people prefer to use ice alone, heat  alone, alternating between ice & heat.  Experiment to what works for you.  Swelling and bruising can take several weeks to resolve.   It is helpful to take an over-the-counter pain medication regularly for the first few weeks.  Choose one of the following that works best for you: Naproxen (Aleve, etc)  Two 260m tabs twice a day Ibuprofen (Advil, etc) Three 2033mtabs four times a day (every meal & bedtime) Acetaminophen (Tylenol, etc) 500-65061mour times a day (every meal & bedtime) A  prescription for pain medication (such as oxycodone, hydrocodone, tramadol, gabapentin, methocarbamol, etc) should be given to you upon discharge.  Take your pain medication as prescribed.  If you are having problems/concerns with the prescription medicine (does not control pain, nausea, vomiting, rash, itching, etc), please call us [phone] see if we need to switch you to a different pain medicine that will work better for you and/or control your side effect better. If you need a refill on your pain medication, please give us [place] hour notice.  contact your pharmacy.  They will contact our office to request authorization. Prescriptions will not be filled after 5 pm or on week-ends  Avoid getting constipated.   Between the surgery and the pain medications, it is common to experience some constipation.   Increasing fluid intake and taking a fiber supplement (such as Metamucil, Citrucel, FiberCon, MiraLax, etc) 1-2 times a day regularly will usually help prevent this problem from occurring.   A mild laxative (prune juice, Milk of Magnesia, MiraLax, etc) should be taken according to package directions if there are no bowel movements after 48 hours.   Watch out for diarrhea.  If you have many loose bowel movements, simplify your diet to bland foods & liquids for a few days.   Stop any stool softeners and decrease your fiber supplement.   Switching to mild anti-diarrheal medications (Kayopectate, Pepto Bismol) can  help.   If this worsens or does not improve, please call us.  Wash / shower every day.  You may shower over the dressings as they are waterproof.  Continue to shower over incision(s) after the dressing is off.  It is good for closed incisions and even open wounds to be washed every day.  Shower every day.  Short baths are fine.  Wash the incisions and wounds clean with soap & water.    You may leave closed incisions open to air if it is dry.   You may cover the incision with clean gauze &replace it after your daily shower for comfort. TEGADERM:  You have clear gauze band-aid dressings over your closed incision(s).  Remove the dressings 3 days after surgery.   ACTIVITIES as tolerated:   You may resume regular (light) daily activities beginning the next day--such as daily self-care, walking, climbing stairs--gradually increasing activities as tolerated.  If you can walk 30 minutes without difficulty, it is safe to try more intense activity such as jogging, treadmill, bicycling, low-impact aerobics, swimming, etc. Save the most intensive and strenuous activity for last such as sit-ups, heavy lifting, contact sports, etc  Refrain from any heavy lifting or straining until you are off narcotics for pain control.   DO NOT PUSH THROUGH PAIN.  Let pain be your guide: If it hurts to do something, don't do it.  Pain is your body warning you to avoid that activity for another week until the pain goes down. You may drive when you are no longer taking prescription pain medication, you can comfortably wear a seatbelt, and you can safely maneuver your car and apply brakes. You may have sexual intercourse when it is comfortable.  FOLLOW UP in our office Please call [facility] at [phone] to set up an appointment to see your surgeon in the office for a follow-up appointment approximately 2-3 weeks after your surgery. Make sure that you call for this appointment the day you arrive home to insure a convenient  appointment time.  10. IF YOU HAVE DISABILITY OR FAMILY LEAVE FORMS, BRING THEM TO THE OFFICE FOR PROCESSING.  DO NOT GIVE THEM TO YOUR DOCTOR.   WHEN TO CALL us [phone]: Poor pain control Reactions / problems with new medications (rash/itching, nausea, etc)  Fever over 101.5 F (38.5 C) Inability to urinate Nausea and/or vomiting Worsening swelling or bruising Continued bleeding from incision. Increased pain, redness, or drainage from the incision   The clinic staff is available to answer your questions during regular business hours (8:30am-5pm).  Please don't hesitate to call and ask to speak to one of our nurses for clinical concerns.   If you have a medical emergency, go to the nearest emergency room or call 911.  A surgeon from [facility] is always on call at the [facility], [place], [place], [place], [place]  [id number] ? MAIN: [phone] ? TOLL FREE: [phone] ?  FAX [phone] [email or web address]  ##############################################################   NO TYLENOL PRDUCTS UNTIL AFTER 4:00 PM TODAY.    Post Anesthesia Home Care Instructions  Activity: Get plenty of rest for the remainder of the day. A responsible individual must stay with you for  24 hours following the procedure.  For the next 24 hours, DO NOT: -Drive a car -Paediatric nurse -Drink alcoholic beverages -Take any medication unless instructed by your physician -Make any legal decisions or sign important papers.  Meals: Start with liquid foods such as gelatin or soup. Progress to regular foods as tolerated. Avoid greasy, spicy, heavy foods. If nausea and/or vomiting occur, drink only clear liquids until the nausea and/or vomiting subsides. Call your physician if vomiting continues.  Special Instructions/Symptoms: Your throat may feel dry or sore from the anesthesia or the breathing tube placed in your  throat during surgery. If this causes discomfort, gargle with warm salt water. The discomfort should disappear within 24 hours.      Information for Discharge Teaching: EXPAREL (bupivacaine liposome injectable suspension)   Your surgeon or anesthesiologist gave you EXPAREL(bupivacaine) to help control your pain after surgery.  EXPAREL is a local anesthetic that provides pain relief by numbing the tissue around the surgical site. EXPAREL is designed to release pain medication over time and can control pain for up to 72 hours. Depending on how you respond to EXPAREL, you may require less pain medication during your recovery.  Possible side effects: Temporary loss of sensation or ability to move in the area where bupivacaine was injected. Nausea, vomiting, constipation Rarely, numbness and tingling in your mouth or lips, lightheadedness, or anxiety may occur. Call your doctor right away if you think you may be experiencing any of these sensations, or if you have other questions regarding possible side effects.  Follow all other discharge instructions given to you by your surgeon or nurse. Eat a healthy diet and drink plenty of water or other fluids.  If you return to the hospital for any reason within 96 hours following the administration of EXPAREL, it is important for health care providers to know that you have received this anesthetic. A teal colored band has been placed on your arm with the date, time and amount of EXPAREL you have received in order to alert and inform your health care providers. Please leave this armband in place for the full 96 hours following administration, and then you may remove the band.

## 2021-05-30 NOTE — Anesthesia Preprocedure Evaluation (Addendum)
Anesthesia Evaluation  Patient identified by MRN, date of birth, ID band Patient awake    Reviewed: Allergy & Precautions, NPO status , Patient's Chart, lab work & pertinent test results  Airway Mallampati: I  TM Distance: >3 FB Neck ROM: Full    Dental  (+) Dental Advisory Given   Pulmonary COPD, Current Smoker and Patient abstained from smoking.,    breath sounds clear to auscultation       Cardiovascular hypertension, Pt. on medications + CAD and + Peripheral Vascular Disease   Rhythm:Regular Rate:Normal     Neuro/Psych  Neuromuscular disease    GI/Hepatic Neg liver ROS, GERD  ,  Endo/Other  Hypothyroidism   Renal/GU negative Renal ROS     Musculoskeletal  (+) Arthritis ,   Abdominal   Peds  Hematology negative hematology ROS (+)   Anesthesia Other Findings   Reproductive/Obstetrics                            Anesthesia Physical Anesthesia Plan  ASA: 3  Anesthesia Plan: General   Post-op Pain Management: Tylenol PO (pre-op) and Gabapentin PO (pre-op)   Induction: Intravenous  PONV Risk Score and Plan: 2 and Dexamethasone, Ondansetron and Treatment may vary due to age or medical condition  Airway Management Planned: Oral ETT  Additional Equipment:   Intra-op Plan:   Post-operative Plan: Extubation in OR  Informed Consent: I have reviewed the patients History and Physical, chart, labs and discussed the procedure including the risks, benefits and alternatives for the proposed anesthesia with the patient or authorized representative who has indicated his/her understanding and acceptance.     Dental advisory given  Plan Discussed with: CRNA  Anesthesia Plan Comments:         Anesthesia Quick Evaluation

## 2021-05-30 NOTE — Anesthesia Procedure Notes (Signed)
Procedure Name: Intubation Date/Time: 05/30/2021 9:35 AM Performed by: Georgeanne Nim, CRNA Pre-anesthesia Checklist: Patient identified, Emergency Drugs available and Suction available

## 2021-05-31 ENCOUNTER — Encounter (HOSPITAL_BASED_OUTPATIENT_CLINIC_OR_DEPARTMENT_OTHER): Payer: Self-pay | Admitting: Surgery

## 2021-05-31 LAB — SURGICAL PATHOLOGY

## 2021-06-14 ENCOUNTER — Encounter: Payer: PPO | Admitting: Gastroenterology

## 2021-06-27 DIAGNOSIS — R350 Frequency of micturition: Secondary | ICD-10-CM | POA: Diagnosis not present

## 2021-06-27 DIAGNOSIS — R300 Dysuria: Secondary | ICD-10-CM | POA: Diagnosis not present

## 2021-07-01 ENCOUNTER — Ambulatory Visit (HOSPITAL_BASED_OUTPATIENT_CLINIC_OR_DEPARTMENT_OTHER)
Admission: RE | Admit: 2021-07-01 | Discharge: 2021-07-01 | Disposition: A | Discharge status: Home / Self Care | Payer: PPO | Source: Ambulatory Visit | Attending: Physician Assistant | Admitting: Physician Assistant

## 2021-07-01 ENCOUNTER — Ambulatory Visit (HOSPITAL_COMMUNITY)
Admission: RE | Admit: 2021-07-01 | Discharge: 2021-07-01 | Disposition: A | Discharge status: Home / Self Care | Payer: PPO | Source: Ambulatory Visit | Attending: Cardiovascular Disease | Admitting: Cardiovascular Disease

## 2021-07-01 ENCOUNTER — Other Ambulatory Visit: Payer: Self-pay

## 2021-07-01 DIAGNOSIS — I77811 Abdominal aortic ectasia: Secondary | ICD-10-CM | POA: Insufficient documentation

## 2021-07-01 DIAGNOSIS — I6523 Occlusion and stenosis of bilateral carotid arteries: Secondary | ICD-10-CM

## 2021-07-03 ENCOUNTER — Telehealth: Payer: Self-pay

## 2021-07-03 MED ORDER — ASPIRIN EC 81 MG PO TBEC: 81.0000 mg | DELAYED_RELEASE_TABLET | Freq: Every day | ORAL | 3 refills | Status: DC

## 2021-07-03 NOTE — Telephone Encounter (Addendum)
Called patient regarding results. Patient had understanding of recommendations mdae by provider.----- Message from Warren Lacy, PA-C sent at 07/02/2021 10:34 AM EST ----- There is worsening left carotid stenosis compared to 2019.  Recommend rechecking carotid duplex in 1 year.  If any symptoms of TIA or stroke (slurred speech, one-sided weakness, vision changes), please seek immediate attention (call 911) and we will need to recheck carotid ultrasound sooner.  Again recommend smoking cessation and cholesterol control. Please take aspirin 81 mg daily if not already taking.  Colletta [place] - please add aspirin 81mg  daily to med list and order repeat carotid duplex in 1 year.  Dx. Carotid stenosis

## 2021-07-03 NOTE — Telephone Encounter (Addendum)
Called patient regarding results. 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 This is very reassuring.   Recommend cutting back smoking.  Continue blood pressure and cholesterol control.

## 2021-07-09 ENCOUNTER — Telehealth: Payer: Self-pay | Admitting: Pharmacist

## 2021-07-09 ENCOUNTER — Telehealth: Payer: PPO

## 2021-07-09 NOTE — Progress Notes (Signed)
Virtual Visit via Telephone Note  I connected with [name] [name] on 07/09/21 at  9:00 AM EST by telephone and verified that I am speaking with the correct person using two identifiers.  Location: Patient:  At Home Provider:  [address], [place], [place], [address]    I discussed the limitations, risks, security and privacy concerns of performing an evaluation and management service by telephone and the availability of in person appointments. I also discussed with the patient that there may be a patient responsible charge related to this service. The patient expressed understanding and agreed to proceed.   Shared Decision Making Visit Lung Cancer Screening Program [phone])   Eligibility: Age 71 y.o. Pack Years Smoking History Calculation 87 pack year smoking history (# packs/per year x # years smoked) Recent History of coughing up blood  no Unexplained weight loss? no ( >Than 15 pounds within the last 6 months ) Prior History Lung / other cancer no (Diagnosis within the last 5 years already requiring surveillance chest CT Scans). Smoking Status Current Smoker Former Smokers: Years since quit:  NA  Quit Date:  NA  Visit Components: Discussion included one or more decision making aids. yes Discussion included risk/benefits of screening. yes Discussion included potential follow up diagnostic testing for abnormal scans. yes Discussion included meaning and risk of over diagnosis. yes Discussion included meaning and risk of False Positives. yes Discussion included meaning of total radiation exposure. yes  Counseling Included: Importance of adherence to annual lung cancer LDCT screening. yes Impact of comorbidities on ability to participate in the program. yes Ability and willingness to under diagnostic treatment. yes  Smoking Cessation Counseling: Current Smokers:  Discussed importance of smoking cessation. yes Information about tobacco cessation classes and  interventions provided to patient. yes Patient provided with "ticket" for LDCT Scan. yes Symptomatic Patient. no  Counseling NA Diagnosis Code: Tobacco Use Z72.0 Asymptomatic Patient yes  Counseling (Intermediate counseling: > three minutes counseling) T6144 Former Smokers:  Discussed the importance of maintaining cigarette abstinence. yes Diagnosis Code: Personal History of Nicotine Dependence. R15.400 Information about tobacco cessation classes and interventions provided to patient. Yes Patient provided with "ticket" for LDCT Scan. yes Written Order for Lung Cancer Screening with LDCT placed in Epic. Yes (CT Chest Lung Cancer Screening Low Dose W/O CM) [id number] Z12.2-Screening of respiratory organs Z87.891-Personal history of nicotine dependence  I have spent 25 minutes of face to face/ virtual visit   time with  [name] [name] discussing the risks and benefits of lung cancer screening. We viewed / discussed a power point together that explained in detail the above noted topics. We paused at intervals to allow for questions to be asked and answered to ensure understanding.We discussed that the single most powerful action that he can take to decrease his risk of developing lung cancer is to quit smoking. We discussed whether or not he is ready to commit to setting a quit date. We discussed options for tools to aid in quitting smoking including nicotine replacement therapy, non-nicotine medications, support groups, Quit Smart classes, and behavior modification. We discussed that often times setting smaller, more achievable goals, such as eliminating 1 cigarette a day for a week and then 2 cigarettes a day for a week can be helpful in slowly decreasing the number of cigarettes smoked. This allows for a sense of accomplishment as well as providing a clinical benefit. I provided  him  with smoking cessation  information  with contact information for community resources,  classes, free nicotine replacement  therapy, and access to mobile apps, text messaging, and on-line smoking cessation help. I have also provided  him  the office contact information in the event he needs to contact me, or the screening staff. We discussed the time and location of the scan, and that either Doroteo Glassman RN, Joella Prince, RN  or I will call / send a letter with the results within 24-72 hours of receiving them. The patient verbalized understanding of all of  the above and had no further questions upon leaving the office. They have my contact information in the event they have any further questions.  I spent 4 minutes counseling on smoking cessation and the health risks of continued tobacco abuse.  I explained to the patient that there has been a high incidence of coronary artery disease noted on these exams. I explained that this is a non-gated exam therefore degree or severity cannot be determined. This patient is not on statin therapy. I have asked the patient to follow-up with their PCP regarding any incidental finding of coronary artery disease and management with diet or medication as their PCP  feels is clinically indicated. The patient verbalized understanding of the above and had no further questions upon completion of the visit.      Magdalen Spatz, NP 07/10/2021

## 2021-07-09 NOTE — Telephone Encounter (Signed)
°  Care Management   Follow Up Note   07/09/2021 Name: [name] [name] MRN: [id number] DOB: [birth year]   Referred by: Dettinger, Fransisca Kaufmann, MD Reason for referral : Appointment (Unsuccessful outreach)   An unsuccessful telephone outreach was attempted today. The patient was referred to the case management team for assistance with care management and care coordination.   Follow Up Plan: Telephone follow up appointment with care management team member to be scheduled  SIGNATURE  Regina Eck, PharmD, BCPS Clinical Pharmacist, [place]  II Phone [phone]

## 2021-07-10 ENCOUNTER — Ambulatory Visit (HOSPITAL_COMMUNITY)
Admission: RE | Admit: 2021-07-10 | Discharge: 2021-07-10 | Disposition: A | Discharge status: Home / Self Care | Payer: PPO | Source: Ambulatory Visit | Attending: Family Medicine | Admitting: Family Medicine

## 2021-07-10 ENCOUNTER — Encounter: Payer: Self-pay | Admitting: Acute Care

## 2021-07-10 ENCOUNTER — Ambulatory Visit (INDEPENDENT_AMBULATORY_CARE_PROVIDER_SITE_OTHER): Payer: PPO | Admitting: Acute Care

## 2021-07-10 ENCOUNTER — Other Ambulatory Visit: Payer: Self-pay

## 2021-07-10 DIAGNOSIS — F17210 Nicotine dependence, cigarettes, uncomplicated: Secondary | ICD-10-CM | POA: Diagnosis not present

## 2021-07-10 DIAGNOSIS — Z87891 Personal history of nicotine dependence: Secondary | ICD-10-CM | POA: Diagnosis not present

## 2021-07-10 NOTE — Patient Instructions (Signed)
Thank you for participating in the [facility]. °It was our pleasure to meet you today. °We will call you with the results of your scan within the next few days. °Your scan will be assigned a Lung RADS category score by the physicians reading the scans.  °This Lung RADS score determines follow up scanning.  °See below for description of categories, and follow up screening recommendations. °We will be in touch to schedule your follow up screening annually or based on recommendations of our providers. °We will fax a copy of your scan results to your Primary Care Physician, or the physician who referred you to the program, to ensure they have the results. °Please call the office if you have any questions or concerns regarding your scanning experience or results.  °Our office number is [phone]. °Please speak with Denise Phelps, RN. She is our Lung Cancer Screening RN. °If she is unavailable when you call, please have the office staff send her a message. She will return your call at her earliest convenience. °Remember, if your scan is normal, we will scan you annually as long as you continue to meet the criteria for the program. (Age 55-77, Current smoker or smoker who has quit within the last 15 years). °If you are a smoker, remember, quitting is the single most powerful action that you can take to decrease your risk of lung cancer and other pulmonary, breathing related problems. °We know quitting is hard, and we are here to help.  °Please let us know if there is anything we can do to help you meet your goal of quitting. °If you are a former smoker, congratulations. We are proud of you! Remain smoke free! °Remember you can refer friends or family members through the number above.  °We will screen them to make sure they meet criteria for the program. °Thank you for helping us take better care of you by participating in Lung Screening. ° °You can receive free nicotine replacement therapy  ( patches, gum or mints) by calling 1-800-QUIT NOW. Please call so we can get you on the path to becoming  a non-smoker. I know it is hard, but you can do this! ° °Lung RADS Categories: ° °Lung RADS 1: no nodules or definitely non-concerning nodules.  °Recommendation is for a repeat annual scan in 12 months. ° °Lung RADS 2:  nodules that are non-concerning in appearance and behavior with a very low likelihood of becoming an active cancer. °Recommendation is for a repeat annual scan in 12 months. ° °Lung RADS 3: nodules that are probably non-concerning , includes nodules with a low likelihood of becoming an active cancer.  Recommendation is for a 6-month repeat screening scan. Often noted after an upper respiratory illness. We will be in touch to make sure you have no questions, and to schedule your 6-month scan. ° °Lung RADS 4 A: nodules with concerning findings, recommendation is most often for a follow up scan in 3 months or additional testing based on our provider's assessment of the scan. We will be in touch to make sure you have no questions and to schedule the recommended 3 month follow up scan. ° °Lung RADS 4 B:  indicates findings that are concerning. We will be in touch with you to schedule additional diagnostic testing based on our provider's  assessment of the scan. ° °Hypnosis for smoking cessation  °[employer] °[phone] ° °Acupuncture for smoking cessation  °[facility] °[phone]  °

## 2021-07-11 ENCOUNTER — Other Ambulatory Visit: Payer: Self-pay

## 2021-07-11 ENCOUNTER — Telehealth: Payer: Self-pay | Admitting: Family Medicine

## 2021-07-11 DIAGNOSIS — F17210 Nicotine dependence, cigarettes, uncomplicated: Secondary | ICD-10-CM

## 2021-07-11 DIAGNOSIS — Z87891 Personal history of nicotine dependence: Secondary | ICD-10-CM

## 2021-07-11 NOTE — Telephone Encounter (Signed)
Pt aware by phone 

## 2021-07-11 NOTE — Telephone Encounter (Signed)
Please let him know that the CT scan shows benign appearing nodules, I do not know if he was already notified by somebody else but they just recommend repeat scanning next year

## 2021-07-15 ENCOUNTER — Encounter: Payer: Self-pay | Admitting: Gastroenterology

## 2021-07-15 ENCOUNTER — Other Ambulatory Visit: Payer: Self-pay | Admitting: Family Medicine

## 2021-07-16 ENCOUNTER — Telehealth: Payer: PPO

## 2021-07-25 ENCOUNTER — Encounter: Payer: Self-pay | Admitting: Family Medicine

## 2021-07-25 ENCOUNTER — Ambulatory Visit (INDEPENDENT_AMBULATORY_CARE_PROVIDER_SITE_OTHER): Payer: PPO | Admitting: Family Medicine

## 2021-07-25 ENCOUNTER — Telehealth: Payer: Self-pay

## 2021-07-25 VITALS — BP 164/79 | HR 83 | Ht 70.0 in | Wt 146.0 lb

## 2021-07-25 DIAGNOSIS — Z9049 Acquired absence of other specified parts of digestive tract: Secondary | ICD-10-CM

## 2021-07-25 DIAGNOSIS — R140 Abdominal distension (gaseous): Secondary | ICD-10-CM | POA: Diagnosis not present

## 2021-07-25 DIAGNOSIS — K5904 Chronic idiopathic constipation: Secondary | ICD-10-CM

## 2021-07-25 NOTE — Progress Notes (Signed)
BP (!) 164/79    Pulse 83    Ht 5\' 10"  (1.778 m)    Wt 146 lb (66.2 kg)    SpO2 96%    BMI 20.95 kg/m    Subjective:   Patient ID: [name] [name], male    DOB: 1951/01/18, 71 y.o.   MRN: [id number]  HPI: [name] [name] is a 71 y.o. male presenting on 07/25/2021 for Bloated and Constipation   HPI Bloating and gassiness and constipation Patient is coming in today with continued complaints of bloating gassiness and constipation.  He has these prior to having his gallbladder removed but he is still struggling with it some after having his gallbladder removed as well.  He takes the Dulcolax and it does help but has not tried anything else.  He denies any abdominal pain but just that is more the bloating and gassiness that he struggles with.  He denies any nausea or vomiting but does have a lot of belching and burping and bloating and gassiness.  He is set to have a colonoscopy later this month.  Relevant past medical, surgical, family and social history reviewed and updated as indicated. Interim medical history since our last visit reviewed. Allergies and medications reviewed and updated.  Review of Systems  Constitutional:  Negative for chills and fever.  Respiratory:  Negative for shortness of breath and wheezing.   Cardiovascular:  Negative for chest pain and leg swelling.  Gastrointestinal:  Positive for abdominal distention and constipation. Negative for abdominal pain, nausea and vomiting.  Musculoskeletal:  Negative for back pain and gait problem.  Skin:  Negative for rash.  All other systems reviewed and are negative.  Per HPI unless specifically indicated above   Allergies as of 07/25/2021       Reactions   Crestor [rosuvastatin Calcium] Other (See Comments)   Lethargy and muscle aches   Flomax [tamsulosin Hcl] Other (See Comments)   Made patient hypotensive, clammy, sweaty, and feel like he was going to pass out (EMS HAD TO BE CALLED)   Lipitor [atorvastatin Calcium]  Other (See Comments)   Body cramps, lethargy and muscle aches        Medication List        Accurate as of July 25, 2021  1:49 PM. If you have any questions, ask your nurse or doctor.          albuterol 108 (90 Base) MCG/ACT inhaler Commonly known as: VENTOLIN HFA INHALE 2 PUFFS EVERY 4 HOURS AS NEEDED FOR WHEEZING OR SHORTNESS OF BREATH What changed: See the new instructions.   Align 4 MG Caps Take 1 capsule by mouth daily.   aspirin EC 81 MG tablet Take 1 tablet (81 mg total) by mouth daily. Swallow whole.   bisacodyl 5 MG EC tablet Commonly known as: DULCOLAX Take 5 mg by mouth at bedtime.   levothyroxine 50 MCG tablet Commonly known as: SYNTHROID TAKE 1 TABLET DAILY What changed: when to take this   lisinopril 20 MG tablet Commonly known as: ZESTRIL TAKE 1 TABLET DAILY What changed: when to take this   multivitamin Tabs tablet Take 1 tablet by mouth daily.   Nexlizet 180-10 MG Tabs Generic drug: Bempedoic Acid-Ezetimibe TAKE 1 TABLET DAILY   nitroGLYCERIN 0.4 MG SL tablet Commonly known as: NITROSTAT Place 1 tablet (0.4 mg total) under the tongue every 5 (five) minutes as needed for chest pain.   omeprazole 20 MG capsule Commonly known as: PRILOSEC Take 1 capsule (20 mg  total) by mouth daily. What changed: when to take this   Spiriva HandiHaler 18 MCG inhalation capsule Generic drug: tiotropium INHALE THE CONTENTS OF 1 CAPSULE ONCE DAILY What changed: See the new instructions.   traMADol 50 MG tablet Commonly known as: ULTRAM Take 1-2 tablets (50-100 mg total) by mouth every 6 (six) hours as needed for moderate pain or severe pain.         Objective:   BP (!) 164/79    Pulse 83    Ht 5\' 10"  (1.778 m)    Wt 146 lb (66.2 kg)    SpO2 96%    BMI 20.95 kg/m   Wt Readings from Last 3 Encounters:  07/25/21 146 lb (66.2 kg)  05/30/21 143 lb 11.2 oz (65.2 kg)  05/07/21 147 lb 3.2 oz (66.8 kg)    Physical Exam Vitals and nursing note  reviewed.  Constitutional:      General: He is not in acute distress.    Appearance: He is well-developed. He is not diaphoretic.  Eyes:     General: No scleral icterus.    Conjunctiva/sclera: Conjunctivae normal.  Skin:    General: Skin is warm and dry.     Findings: No rash.  Neurological:     Mental Status: He is alert and oriented to person, place, and time.     Coordination: Coordination normal.  Psychiatric:        Behavior: Behavior normal.      Assessment & Plan:   Problem List Items Addressed This Visit       Digestive   Chronic idiopathic constipation   Other Visit Diagnoses     Status post cholecystectomy    -  Primary   Abdominal bloating         He has seen gastroenterology and informed him to discuss this with them.  Gave him a diet that he should follow after having his gallbladder removed to help with symptoms and recommended MiraLAX daily.  Follow up plan: Return if symptoms worsen or fail to improve.  Counseling provided for all of the vaccine components No orders of the defined types were placed in this encounter.   Caryl Pina, MD [facility] 07/25/2021, 1:49 PM

## 2021-07-25 NOTE — Telephone Encounter (Signed)
Per [place], please have Almyra Free review pts chart. Pt states that he has not received assistance on his Nexlizet.  Almyra Free,  It looks like you did try to contact pt on 1/17 but was unsuccessful.

## 2021-07-26 NOTE — Telephone Encounter (Signed)
Pt has been informed and will come next Monday to pick up.

## 2021-07-26 NOTE — Telephone Encounter (Signed)
Please have patient pick up his approval letter from the [place].  I placed up front for pick up  He needs to take page 3 (copay card) of approval letter to pharmacy and pick up nexlizet.  They will run copay card and it should be $0 per month  DO not pick up medication if this does not work.  Patient can call me if card doesn't work (but it should)

## 2021-07-29 ENCOUNTER — Ambulatory Visit (INDEPENDENT_AMBULATORY_CARE_PROVIDER_SITE_OTHER): Payer: PPO | Admitting: Family Medicine

## 2021-07-29 ENCOUNTER — Encounter: Payer: Self-pay | Admitting: Family Medicine

## 2021-07-29 VITALS — BP 141/86 | HR 86 | Ht 70.0 in | Wt 146.0 lb

## 2021-07-29 DIAGNOSIS — R051 Acute cough: Secondary | ICD-10-CM | POA: Diagnosis not present

## 2021-07-29 DIAGNOSIS — R0981 Nasal congestion: Secondary | ICD-10-CM | POA: Diagnosis not present

## 2021-07-29 DIAGNOSIS — J441 Chronic obstructive pulmonary disease with (acute) exacerbation: Secondary | ICD-10-CM

## 2021-07-29 DIAGNOSIS — R0989 Other specified symptoms and signs involving the circulatory and respiratory systems: Secondary | ICD-10-CM | POA: Diagnosis not present

## 2021-07-29 LAB — VERITOR FLU A/B WAIVED
Influenza A: NEGATIVE
Influenza B: NEGATIVE

## 2021-07-29 MED ORDER — AMOXICILLIN 500 MG PO CAPS
500.0000 mg | ORAL_CAPSULE | Freq: Two times a day (BID) | ORAL | 0 refills | Status: DC
Start: 2021-07-29 — End: 2021-08-16

## 2021-07-29 MED ORDER — METHYLPREDNISOLONE ACETATE 40 MG/ML IJ SUSP
80.0000 mg | Freq: Once | INTRAMUSCULAR | Status: AC
  Administered 2021-07-29: 80 mg via INTRAMUSCULAR

## 2021-07-29 MED ORDER — PREDNISONE 20 MG PO TABS: ORAL_TABLET | ORAL | 0 refills | Status: DC

## 2021-07-29 NOTE — Progress Notes (Signed)
BP (!) 141/86    Pulse 86    Ht 5\' 10"  (1.778 m)    Wt 146 lb (66.2 kg)    SpO2 96%    BMI 20.95 kg/m    Subjective:   Patient ID: [name] [name], male    DOB: [birth year], 71 y.o.   MRN: [id number]  HPI: [name] [name] is a 71 y.o. male presenting on 07/29/2021 for Nasal Congestion (/), chest congestion, and Cough   HPI Presents with cough, congestion, sore throat. States symptoms began on Saturday. He has tried mucinex which as moderately helped symptoms. States his wife has been sick with the same symptoms for a week. He took an at home COVID test yesterday which was negative.  Patient has been using his albuterol inhalers as well which has given him some benefit as well.  He is still smoking although he has tried to reduce it.  He does take Spiriva daily.  He does complain of some wheezing.  Relevant past medical, surgical, family and social history reviewed and updated as indicated. Interim medical history since our last visit reviewed. Allergies and medications reviewed and updated.  Review of Systems Constitutional - denies fever, chills, headache HEENT - admits sore throat, drainage; denies vision changes Cardio - denies chest pain (aside from soreness from coughing), chest tightness, heart palpitations Pulm - admits sob, cough, wheezing GI - denies abd pain, n/v/d, constipation, blood in stool GU - denies frequency, painful urination, hematuria  Per HPI unless specifically indicated above   Allergies as of 07/29/2021       Reactions   Crestor [rosuvastatin Calcium] Other (See Comments)   Lethargy and muscle aches   Flomax [tamsulosin Hcl] Other (See Comments)   Made patient hypotensive, clammy, sweaty, and feel like he was going to pass out (EMS HAD TO BE CALLED)   Lipitor [atorvastatin Calcium] Other (See Comments)   Body cramps, lethargy and muscle aches        Medication List        Accurate as of July 29, 2021  3:49 PM. If you have any questions, ask  your nurse or doctor.          albuterol 108 (90 Base) MCG/ACT inhaler Commonly known as: VENTOLIN HFA INHALE 2 PUFFS EVERY 4 HOURS AS NEEDED FOR WHEEZING OR SHORTNESS OF BREATH What changed: See the new instructions.   Align 4 MG Caps Take 1 capsule by mouth daily.   amoxicillin 500 MG capsule Commonly known as: AMOXIL Take 1 capsule (500 mg total) by mouth 2 (two) times daily. Started by: Worthy Rancher, MD   aspirin EC 81 MG tablet Take 1 tablet (81 mg total) by mouth daily. Swallow whole.   bisacodyl 5 MG EC tablet Commonly known as: DULCOLAX Take 5 mg by mouth at bedtime.   levothyroxine 50 MCG tablet Commonly known as: SYNTHROID TAKE 1 TABLET DAILY What changed: when to take this   lisinopril 20 MG tablet Commonly known as: ZESTRIL TAKE 1 TABLET DAILY What changed: when to take this   multivitamin Tabs tablet Take 1 tablet by mouth daily.   Nexlizet 180-10 MG Tabs Generic drug: Bempedoic Acid-Ezetimibe TAKE 1 TABLET DAILY   nitroGLYCERIN 0.4 MG SL tablet Commonly known as: NITROSTAT Place 1 tablet (0.4 mg total) under the tongue every 5 (five) minutes as needed for chest pain.   omeprazole 20 MG capsule Commonly known as: PRILOSEC Take 1 capsule (20 mg total) by mouth daily. What changed:  when to take this   predniSONE 20 MG tablet Commonly known as: DELTASONE 2 po at same time daily for 5 days Started by: Fransisca Kaufmann Kayren Holck, MD   Spiriva HandiHaler 18 MCG inhalation capsule Generic drug: tiotropium INHALE THE CONTENTS OF 1 CAPSULE ONCE DAILY What changed: See the new instructions.   traMADol 50 MG tablet Commonly known as: ULTRAM Take 1-2 tablets (50-100 mg total) by mouth every 6 (six) hours as needed for moderate pain or severe pain.         Objective:   BP (!) 141/86    Pulse 86    Ht 5\' 10"  (1.778 m)    Wt 146 lb (66.2 kg)    SpO2 96%    BMI 20.95 kg/m   Wt Readings from Last 3 Encounters:  07/29/21 146 lb (66.2 kg)  07/25/21  146 lb (66.2 kg)  05/30/21 143 lb 11.2 oz (65.2 kg)    Physical Exam HEENT - throat was mildly erythematous, lymph nodes were nonpalpable Cardio - regular rate/rhythm no murmurs Pulm - wheezing and ronchi in both lungs was noted  Results for orders placed or performed in visit on 07/29/21  Veritor Flu A/B Waived  Result Value Ref Range   Influenza A Negative Negative   Influenza B Negative Negative    Assessment & Plan:   Problem List Items Addressed This Visit   None Visit Diagnoses     Congestion of nasal sinus    -  Primary   Relevant Medications   amoxicillin (AMOXIL) 500 MG capsule   methylPREDNISolone acetate (DEPO-MEDROL) injection 80 mg (Completed)   predniSONE (DELTASONE) 20 MG tablet   Other Relevant Orders   Veritor Flu A/B Waived (Completed)   Novel Coronavirus, NAA ([facility])   COPD exacerbation (HCC)       Relevant Medications   amoxicillin (AMOXIL) 500 MG capsule   methylPREDNISolone acetate (DEPO-MEDROL) injection 80 mg (Completed)   predniSONE (DELTASONE) 20 MG tablet   Other Relevant Orders   Veritor Flu A/B Waived (Completed)   Novel Coronavirus, NAA ([facility])   Acute cough       Relevant Medications   amoxicillin (AMOXIL) 500 MG capsule   methylPREDNISolone acetate (DEPO-MEDROL) injection 80 mg (Completed)   predniSONE (DELTASONE) 20 MG tablet   Other Relevant Orders   Veritor Flu A/B Waived (Completed)   Novel Coronavirus, NAA ([facility])      Diagnosis is bronchitis/COPD exacerbation. COVID test was done to rule out.  Prescribed amoxicillin 500 mg bid for 10 days, depo-medrol 80 mg inj given in office, and prednisone 2 tablets daily for 5 days. Counseled to continue mucinex until symptoms resolve and call if symptoms do not improve.   Follow up plan: Return if symptoms worsen or fail to improve.  Counseling provided for all of the vaccine components Orders Placed This Encounter  Procedures   Novel Coronavirus, NAA ([facility])   Veritor Flu  A/B Waived    Tolar Kenton Kingfisher, PA-S [facility] 07/29/2021, 3:49 PM  I was personally present for all components of the history, physical exam and/or medical decision making.  I agree with the documentation performed by the PA student and agree with assessment and plan above.  PA student was Warehouse manager. Caryl Pina, MD [facility] 07/29/2021, 3:53 PM

## 2021-07-30 LAB — NOVEL CORONAVIRUS, NAA: SARS-CoV-2, NAA: NOT DETECTED

## 2021-07-30 LAB — SARS-COV-2, NAA 2 DAY TAT

## 2021-08-12 ENCOUNTER — Encounter: Payer: Self-pay | Admitting: Nurse Practitioner

## 2021-08-12 ENCOUNTER — Ambulatory Visit (INDEPENDENT_AMBULATORY_CARE_PROVIDER_SITE_OTHER): Payer: PPO | Admitting: Nurse Practitioner

## 2021-08-12 DIAGNOSIS — J069 Acute upper respiratory infection, unspecified: Secondary | ICD-10-CM | POA: Diagnosis not present

## 2021-08-12 MED ORDER — AZITHROMYCIN 250 MG PO TABS: ORAL_TABLET | ORAL | 0 refills | Status: DC

## 2021-08-12 MED ORDER — BENZONATATE 100 MG PO CAPS: 100.0000 mg | ORAL_CAPSULE | Freq: Two times a day (BID) | ORAL | 0 refills | Status: DC | PRN

## 2021-08-12 NOTE — Progress Notes (Signed)
Virtual Visit  Note Due to COVID-19 pandemic this visit was conducted virtually. This visit type was conducted due to national recommendations for restrictions regarding the COVID-19 Pandemic (e.g. social distancing, sheltering in place) in an effort to limit this patient's exposure and mitigate transmission in our community. All issues noted in this document were discussed and addressed.  A physical exam was not performed with this format.  I connected with [name] [name] on 08/12/21 at 9:12 by telephone and verified that I am speaking with the correct person using two identifiers. [name] [name] is currently located at home and no one  is currently with him during visit. The provider, [name]-Margaret Hassell Done, FNP is located in their office at time of visit.  I discussed the limitations, risks, security and privacy concerns of performing an evaluation and management service by telephone and the availability of in person appointments. I also discussed with the patient that there may be a patient responsible charge related to this service. The patient expressed understanding and agreed to proceed.   History and Present Illness:  URI  This is a new problem. The current episode started yesterday. The problem has been gradually worsening. The maximum temperature recorded prior to his arrival was 100.4 - 100.9 F. The fever has been present for Less than 1 day. Associated symptoms include congestion, coughing, headaches, rhinorrhea and a sore throat. He has tried acetaminophen for the symptoms. The treatment provided mild relief.  Has not tested for covid   Review of Systems  HENT:  Positive for congestion, rhinorrhea and sore throat.   Respiratory:  Positive for cough.   Neurological:  Positive for headaches.    Observations/Objective: Alert and oriented- answers all questions appropriately No distress Raspy voice No cough during visit   Assessment and Plan:  [name] [name] in  today with chief complaint of URI   1. URI with cough and congestion 1. Take meds as prescribed 2. Use a cool mist humidifier especially during the winter months and when heat has been humid. 3. Use saline nose sprays frequently 4. Saline irrigations of the nose can be very helpful if done frequently.  * 4X daily for 1 week*  * Use of a nettie pot can be helpful with this. Follow directions with this* 5. Drink plenty of fluids 6. Keep thermostat turn down low 7.For any cough or congestion- tessalon perles 8. For fever or aces or pains- take tylenol or ibuprofen appropriate for age and weight.  * for fevers greater than 101 orally you may alternate ibuprofen and tylenol every  3 hours.    - azithromycin (ZITHROMAX Z-PAK) 250 MG tablet; As directed  Dispense: 6 tablet; Refill: 0 - benzonatate (TESSALON) 100 MG capsule; Take 1 capsule (100 mg total) by mouth 2 (two) times daily as needed for cough.  Dispense: 20 capsule; Refill: 0  Patient will do home covid test and if it is positive her will call back   Follow Up Instructions: prn    I discussed the assessment and treatment plan with the patient. The patient was provided an opportunity to ask questions and all were answered. The patient agreed with the plan and demonstrated an understanding of the instructions.   The patient was advised to call back or seek an in-person evaluation if the symptoms worsen or if the condition fails to improve as anticipated.  The above assessment and management plan was discussed with the patient. The patient verbalized understanding of and has agreed to  the management plan. Patient is aware to call the clinic if symptoms persist or worsen. Patient is aware when to return to the clinic for a follow-up visit. Patient educated on when it is appropriate to go to the emergency department.   Time call ended:  9:25  I provided 13 minutes of  non face-to-face time during this encounter.    [name]-Margaret  Hassell Done, FNP

## 2021-08-12 NOTE — Patient Instructions (Signed)

## 2021-08-13 ENCOUNTER — Other Ambulatory Visit (HOSPITAL_COMMUNITY): Payer: Self-pay | Admitting: Physician Assistant

## 2021-08-13 DIAGNOSIS — I6523 Occlusion and stenosis of bilateral carotid arteries: Secondary | ICD-10-CM

## 2021-08-16 ENCOUNTER — Ambulatory Visit (AMBULATORY_SURGERY_CENTER): Payer: PPO | Admitting: *Deleted

## 2021-08-16 ENCOUNTER — Other Ambulatory Visit: Payer: Self-pay

## 2021-08-16 ENCOUNTER — Encounter: Payer: Self-pay | Admitting: Gastroenterology

## 2021-08-16 VITALS — Ht 70.0 in | Wt 146.0 lb

## 2021-08-16 DIAGNOSIS — Z86010 Personal history of colonic polyps: Secondary | ICD-10-CM

## 2021-08-16 DIAGNOSIS — Z800 Family history of malignant neoplasm of digestive organs: Secondary | ICD-10-CM

## 2021-08-16 MED ORDER — NA SULFATE-K SULFATE-MG SULF 17.5-3.13-1.6 GM/177ML PO SOLN: 1.0000 | ORAL | 0 refills | Status: DC

## 2021-08-16 NOTE — Progress Notes (Signed)
Patient's pre-visit was done today over the phone with the patient. Name,DOB and address verified. Patient denies any allergies to Eggs and Soy. Patient denies any problems with anesthesia/sedation. Patient is not taking any diet pills or blood thinners. No home Oxygen.   Prep instructions mailed to pt-pt aware. Patient understands to call us back with any questions or concerns. Patient is aware of our care-partner policy and [id number] safety protocol.

## 2021-08-30 ENCOUNTER — Ambulatory Visit (AMBULATORY_SURGERY_CENTER): Payer: PPO | Admitting: Gastroenterology

## 2021-08-30 ENCOUNTER — Encounter: Payer: Self-pay | Admitting: Gastroenterology

## 2021-08-30 VITALS — BP 85/46 | HR 73 | Temp 96.8°F | Resp 15 | Ht 70.0 in | Wt 146.0 lb

## 2021-08-30 DIAGNOSIS — D120 Benign neoplasm of cecum: Secondary | ICD-10-CM

## 2021-08-30 DIAGNOSIS — D124 Benign neoplasm of descending colon: Secondary | ICD-10-CM

## 2021-08-30 DIAGNOSIS — D123 Benign neoplasm of transverse colon: Secondary | ICD-10-CM | POA: Diagnosis not present

## 2021-08-30 DIAGNOSIS — Z86010 Personal history of colonic polyps: Secondary | ICD-10-CM | POA: Diagnosis not present

## 2021-08-30 DIAGNOSIS — D122 Benign neoplasm of ascending colon: Secondary | ICD-10-CM

## 2021-08-30 DIAGNOSIS — Z800 Family history of malignant neoplasm of digestive organs: Secondary | ICD-10-CM

## 2021-08-30 MED ORDER — SODIUM CHLORIDE 0.9 % IV SOLN: 500.0000 mL | Freq: Once | INTRAVENOUS | Status: DC

## 2021-08-30 NOTE — Progress Notes (Signed)
[facility] History and Physical   Primary Care Physician:  Dettinger, Fransisca Kaufmann, MD   Reason for Procedure:   History of colon polyps, FH of CRC  Plan:    Colonoscopy     HPI: [name] [name] is a 71 y.o. male  here for colonoscopy surveillance - numerous advanced polyps removed 12/2016. One very difficult to remove along IC valve - in piecemeal - had recommended a 3 month follow up after that exam but the patient never followed up. Patient denies any bowel symptoms at this time. Brother had CRC at age 19. Otherwise feels well without any cardiopulmonary symptoms.    Past Medical History:  Diagnosis Date   Arthritis    Carotid artery stenosis without cerebral infarction    vascular --- dr Scot Dock;   09-24-2007  s/p right CEA   Chronic cholecystitis    w/  large calcified stone in gallbladder   Chronic constipation    COPD (chronic obstructive pulmonary disease) ([place])    followed by pcp----  (05-28-2021  pt stated last used rescue 05-27-2021 for wheezing resolved after use, stated exacerbation approx  greater than a year ago)   Coronary artery disease 2002   cardiologist-- dr Percival Spanish ;  (05-28-2021  pt denies need to use nitro for CP) per last cath 08-04-2017  LM luminal irregularities, LAD 30% ,D1 ostial 35% ,CFx   AV groove ostial 25% , OM1 long prox 30%,  proxRCA occluded and dominant,   excellent left to right collateral filling.   Depression    Patient denies   DOE (dyspnea on exertion)    due to smoking/ copd   Dyslipidemia    Essential hypertension    Full dentures    GERD (gastroesophageal reflux disease)    History of adenomatous polyp of colon    History of COVID-19    per pt winter 2022, mild to moderate symptoms that resolved   History of diverticulitis of colon    History of kidney stones    Hyperlipidemia    Hypothyroidism    followed by pcp   Lung nodule    s/p bronchoscopy w/ bx in 2005,  benign   Osteoporosis    Smokers' cough ([place])     05-28-2021  pt stated occasionally productive w/ white phlegm   Wears glasses     Past Surgical History:  Procedure Laterality Date   BRONCHOSCOPY  2005   CARDIAC CATHETERIZATION  08/11/2000   '@[facility]'$  by dr Lia Foyer;   preserved LVF,  ostial RCA 70-75%, other nonobstructive disease   CARDIAC CATHETERIZATION  03/30/2008   '@[facility]'$  by dr hochrein;  occluded RCA and other nonobstructive involving LAD, CFx, D1,OM1  and left to right collateral filling,  mild inferior hypokinesis,  ef 55%   CAROTID ENDARTERECTOMY Right 09/24/2007   '@[facility]'$  by dr Scot Dock   COLONOSCOPY  01/19/2017   by armsbruster   EXCISION NEUROMA Left 10/01/2005   '@[facility]'$ ;  left inguinal   EXTRACORPOREAL SHOCK WAVE LITHOTRIPSY  2015   INGUINAL HERNIA REPAIR Left    01-13-2003 and 07-22-2006 both '@[facility]    LAPAROSCOPIC CHOLECYSTECTOMY SINGLE SITE WITH INTRAOPERATIVE CHOLANGIOGRAM N/A 05/30/2021   Procedure: LAPAROSCOPIC CHOLECYSTECTOMY SINGLE SITE WITH INTRAOPERATIVE CHOLANGIOGRAM;  Surgeon: Michael Boston, MD;  Location: [facility];  Service: General;  Laterality: N/A;   LAPAROSCOPIC INGUINAL HERNIA REPAIR Bilateral 04/28/2007   '@[facility]'$    LEFT HEART CATH AND CORONARY ANGIOGRAPHY N/A 08/04/2017   Procedure: LEFT HEART CATH AND CORONARY ANGIOGRAPHY;  Surgeon: Lauree Chandler  D, MD;  Location: [facility];  Service: Cardiovascular;  Laterality: N/A;   THROAT SURGERY  1997   pt stated removal of tumor that was benign    Prior to Admission medications   Medication Sig Start Date End Date Taking? Authorizing Provider  albuterol (VENTOLIN HFA) 108 (90 Base) MCG/ACT inhaler INHALE 2 PUFFS EVERY 4 HOURS AS NEEDED FOR WHEEZING OR SHORTNESS OF BREATH Patient taking differently: 2 puffs every 4 (four) hours as needed. 04/03/21  Yes Dettinger, Fransisca Kaufmann, MD  aspirin EC 81 MG tablet Take 1 tablet (81 mg total) by mouth daily. Swallow whole. 07/03/21  Yes Caron Presume K, PA-C  bisacodyl (DULCOLAX) 5 MG EC tablet Take 5 mg by  mouth at bedtime.   Yes [provider]  levothyroxine (SYNTHROID) 50 MCG tablet TAKE 1 TABLET DAILY Patient taking differently: Take 50 mcg by mouth daily before breakfast. 04/03/21  Yes Dettinger, Fransisca Kaufmann, MD  lisinopril (ZESTRIL) 20 MG tablet TAKE 1 TABLET DAILY Patient taking differently: Take 20 mg by mouth at bedtime. 05/09/21  Yes Dettinger, Fransisca Kaufmann, MD  NEXLIZET 180-10 MG TABS TAKE 1 TABLET DAILY 07/15/21  Yes Dettinger, Fransisca Kaufmann, MD  omeprazole (PRILOSEC) 20 MG capsule Take 1 capsule (20 mg total) by mouth daily. Patient taking differently: Take 20 mg by mouth at bedtime. 03/20/21  Yes Dettinger, Fransisca Kaufmann, MD  Probiotic Product (ALIGN) 4 MG CAPS Take 1 capsule by mouth daily.   Yes [provider]  benzonatate (TESSALON) 100 MG capsule Take 1 capsule (100 mg total) by mouth 2 (two) times daily as needed for cough. 08/12/21   Hassell Done [name]-Margaret, FNP  multivitamin (ONE-A-DAY MEN'S) TABS tablet Take 1 tablet by mouth daily.    [provider]  nitroGLYCERIN (NITROSTAT) 0.4 MG SL tablet Place 1 tablet (0.4 mg total) under the tongue every 5 (five) minutes as needed for chest pain. Patient not taking: Reported on 08/16/2021 09/03/20 05/28/21  Dettinger, Fransisca Kaufmann, MD  SPIRIVA HANDIHALER 18 MCG inhalation capsule INHALE THE CONTENTS OF 1 CAPSULE ONCE DAILY Patient taking differently: 18 mcg daily. 03/13/21   Dettinger, Fransisca Kaufmann, MD  traMADol (ULTRAM) 50 MG tablet Take 1-2 tablets (50-100 mg total) by mouth every 6 (six) hours as needed for moderate pain or severe pain. 05/30/21   Michael Boston, MD    Current Outpatient Medications  Medication Sig Dispense Refill   albuterol (VENTOLIN HFA) 108 (90 Base) MCG/ACT inhaler INHALE 2 PUFFS EVERY 4 HOURS AS NEEDED FOR WHEEZING OR SHORTNESS OF BREATH (Patient taking differently: 2 puffs every 4 (four) hours as needed.) 18 g 5   aspirin EC 81 MG tablet Take 1 tablet (81 mg total) by mouth daily. Swallow whole. 90 tablet 3    bisacodyl (DULCOLAX) 5 MG EC tablet Take 5 mg by mouth at bedtime.     levothyroxine (SYNTHROID) 50 MCG tablet TAKE 1 TABLET DAILY (Patient taking differently: Take 50 mcg by mouth daily before breakfast.) 90 tablet 3   lisinopril (ZESTRIL) 20 MG tablet TAKE 1 TABLET DAILY (Patient taking differently: Take 20 mg by mouth at bedtime.) 90 tablet 1   NEXLIZET 180-10 MG TABS TAKE 1 TABLET DAILY 30 tablet 1   omeprazole (PRILOSEC) 20 MG capsule Take 1 capsule (20 mg total) by mouth daily. (Patient taking differently: Take 20 mg by mouth at bedtime.) 90 capsule 3   Probiotic Product (ALIGN) 4 MG CAPS Take 1 capsule by mouth daily.     benzonatate (TESSALON) 100  MG capsule Take 1 capsule (100 mg total) by mouth 2 (two) times daily as needed for cough. 20 capsule 0   multivitamin (ONE-A-DAY MEN'S) TABS tablet Take 1 tablet by mouth daily.     nitroGLYCERIN (NITROSTAT) 0.4 MG SL tablet Place 1 tablet (0.4 mg total) under the tongue every 5 (five) minutes as needed for chest pain. (Patient not taking: Reported on 08/16/2021) 25 tablet 3   SPIRIVA HANDIHALER 18 MCG inhalation capsule INHALE THE CONTENTS OF 1 CAPSULE ONCE DAILY (Patient taking differently: 18 mcg daily.) 30 capsule 12   traMADol (ULTRAM) 50 MG tablet Take 1-2 tablets (50-100 mg total) by mouth every 6 (six) hours as needed for moderate pain or severe pain. 20 tablet 0   Current Facility-Administered Medications  Medication Dose Route Frequency Provider Last Rate Last Admin   0.9 %  sodium chloride infusion  500 mL Intravenous Once Amedio Bowlby, Carlota Raspberry, MD        Allergies as of 08/30/2021 - Review Complete 08/30/2021  Allergen Reaction Noted   Crestor [rosuvastatin calcium] Other (See Comments)    Flomax [tamsulosin hcl] Other (See Comments) 01/07/2016   Lipitor [atorvastatin calcium] Other (See Comments)     Family History  Problem Relation Age of Onset   Lupus Mother    Lung disease Father    Lupus Brother    Alcohol abuse Brother     Lung disease Brother    Colon cancer Brother 43   Cancer Brother        colon   Lung disease Brother    Healthy Daughter    Healthy Son    Healthy Son    Healthy Son    Esophageal cancer Neg Hx    Rectal cancer Neg Hx    Stomach cancer Neg Hx    Pancreatic cancer Neg Hx    Crohn's disease Neg Hx     Social History   Socioeconomic History   Marital status: Married    Spouse name: Selma   Number of children: 4   Years of education: high school diploma   Highest education level: 12th grade  Occupational History   Occupation: truck Education administrator: [employer]  Tobacco Use   Smoking status: Every Day    Packs/day: 1.50    Years: 58.00    Pack years: 87.00    Types: Cigarettes   Smokeless tobacco: Never  Vaping Use   Vaping Use: Never used  Substance and Sexual Activity   Alcohol use: Yes    Alcohol/week: 2.0 standard drinks    Types: 2 Standard drinks or equivalent per week   Drug use: No   Sexual activity: Yes    Birth control/protection: None  Other Topics Concern   Not on file  Social History Narrative   Lives with wife in a split level home.    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   Social Determinants of Health   Financial Resource Strain: Medium Risk   Difficulty of Paying Living Expenses: Somewhat hard  Food Insecurity: No Food Insecurity   Worried About Charity fundraiser in the Last Year: Never true   Ran Out of Food in the Last Year: Never true  Transportation Needs: No Transportation Needs   Lack of Transportation (Medical): No   Lack of Transportation (Non-Medical):  No  Physical Activity: Sufficiently Active   Days of Exercise per Week: 5 days   Minutes of Exercise per Session: 60 min  Stress: No Stress Concern Present   Feeling of Stress : Not at all  Social Connections: Socially Integrated    Frequency of Communication with Friends and Family: More than three times a week   Frequency of Social Gatherings with Friends and Family: More than three times a week   Attends Religious Services: More than 4 times per year   Active Member of [employer] or Organizations: Yes   Attends Music therapist: More than 4 times per year   Marital Status: Married  Human resources officer Violence: Not At Risk   Fear of Current or Ex-Partner: No   Emotionally Abused: No   Physically Abused: No   Sexually Abused: No    Review of Systems: All other review of systems negative except as mentioned in the HPI.  Physical Exam: Vital signs BP 118/73    Pulse 81    Temp (!) 96.8 F (36 C) (Temporal)    Resp (!) 21    Ht '5\' 10"'$  (1.778 m)    Wt 146 lb (66.2 kg)    SpO2 100%    BMI 20.95 kg/m   General:   Alert,  Well-developed, pleasant and cooperative in NAD Lungs:  Clear throughout to auscultation.   Heart:  Regular rate and rhythm Abdomen:  Soft, nontender and nondistended.   Neuro/Psych:  Alert and cooperative. Normal mood and affect. A and O x 3  [name] Mango, MD [facility]

## 2021-08-30 NOTE — Progress Notes (Signed)
Vitals-DT  Pt's states no medical or surgical changes since previsit or office visit.  

## 2021-08-30 NOTE — Progress Notes (Signed)
A and O x3. Report to RN. Tolerated MAC anesthesia well.  ?

## 2021-08-30 NOTE — Op Note (Signed)
[facility] ?Patient Name: [name] [name] ?Procedure Date: 08/30/2021 1:59 PM ?MRN: [id number] ?Endoscopist: Carlota Raspberry. Havery Moros , MD ?Age: 71 ?Referring MD:  ?Date of Birth: 1950-08-04 ?Gender: Male ?Account #: [id number] ?Procedure:                Colonoscopy ?Indications:              High risk colon cancer surveillance: Personal  ?                          history of colonic polyps - numerous polyps removed  ?                          12/2016 - including multiple advanced adenomas, one  ?                          large piecemeal on the IC valve going into ileum.  ?                          Had recommended 3 month follow up after that exam  ?                          but patient has not followed up until now ?Medicines:                Monitored Anesthesia Care ?Procedure:                Pre-Anesthesia Assessment: ?                          - Prior to the procedure, a History and Physical  ?                          was performed, and patient medications and  ?                          allergies were reviewed. The patient's tolerance of  ?                          previous anesthesia was also reviewed. The risks  ?                          and benefits of the procedure and the sedation  ?                          options and risks were discussed with the patient.  ?                          All questions were answered, and informed consent  ?                          was obtained. Prior Anticoagulants: The patient has  ?                          taken no previous anticoagulant or antiplatelet  ?  agents. ASA Grade Assessment: II - A patient with  ?                          mild systemic disease. After reviewing the risks  ?                          and benefits, the patient was deemed in  ?                          satisfactory condition to undergo the procedure. ?                          After obtaining informed consent, the colonoscope  ?                          was passed  under direct vision. Throughout the  ?                          procedure, the patient's blood pressure, pulse, and  ?                          oxygen saturations were monitored continuously. The  ?                          Olympus [id number]-H190DL (#[id number]) Colonoscope was  ?                          introduced through the anus and advanced to the the  ?                          terminal ileum, with identification of the  ?                          appendiceal orifice and IC valve. The colonoscopy  ?                          was performed without difficulty. The patient  ?                          tolerated the procedure well. The quality of the  ?                          bowel preparation was good. The terminal ileum,  ?                          ileocecal valve, appendiceal orifice, and rectum  ?                          were photographed. ?Scope In: 2:12:00 PM ?Scope Out: 2:43:41 PM ?Scope Withdrawal Time: 0 hours 28 minutes 9 seconds  ?Total Procedure Duration: 0 hours 31 minutes 41 seconds  ?Findings:                 The perianal and digital rectal examinations were  ?  normal. ?                          The terminal ileum appeared normal. ?                          A roughly 12 to 15 mm polyp was found in the  ?                          ileocecal valve on the front lip of the valve. The  ?                          polyp was sessile. It was not invading the ileum  ?                          and on the edge of the valve as it transitioned  ?                          into ileum. The polyp was removed with a cold snare  ?                          in piecemeal fashion. Resection and retrieval were  ?                          complete. Wide margin was taken. ?                          A 3 mm polyp was found in the ileocecal valve. The  ?                          polyp was sessile. The polyp was removed with a  ?                          cold snare. Resection and retrieval were complete. ?                           A 6 mm polyp was found in the ascending colon. The  ?                          polyp was sessile. The polyp was removed with a  ?                          cold snare. Resection and retrieval were complete. ?                          Four sessile polyps were found in the transverse  ?                          colon. The polyps were 4 to 10 mm in size. These  ?                          polyps were removed with  a cold snare. Resection  ?                          and retrieval were complete. ?                          A 4 mm polyp was found in the descending colon. The  ?                          polyp was sessile. The polyp was removed with a  ?                          cold snare. Resection and retrieval were complete. ?                          A 4 mm polyp was found in the sigmoid colon. The  ?                          polyp was sessile. The polyp was removed with a  ?                          cold snare. Resection and retrieval were complete. ?                          Multiple medium-mouthed diverticula were found in  ?                          the sigmoid colon. ?                          Internal hemorrhoids were found during retroflexion. ?                          The exam was otherwise without abnormality. ?Complications:            No immediate complications. Estimated blood loss:  ?                          Minimal. ?Estimated Blood Loss:     Estimated blood loss was minimal. ?Impression:               - The examined portion of the ileum was normal. ?                          - One 12-15 mm polyp at the ileocecal valve,  ?                          removed with a cold snare. Resected and retrieved. ?                          - One 3 mm polyp at the ileocecal valve, removed  ?                          with a cold snare. Resected and retrieved. ?                          -  One 6 mm polyp in the ascending colon, removed  ?                          with a cold snare. Resected and retrieved. ?                           - Four 4 to 10 mm polyps in the transverse colon,  ?                          removed with a cold snare. Resected and retrieved. ?                          - One 4 mm polyp in the descending colon, removed  ?                          with a cold snare. Resected and retrieved. ?                          - One 4 mm polyp in the sigmoid colon, removed with  ?                          a cold snare. Resected and retrieved. ?                          - Diverticulosis in the sigmoid colon. ?                          - Internal hemorrhoids. ?                          - The examination was otherwise normal. ?Recommendation:           - Patient has a contact number available for  ?                          emergencies. The signs and symptoms of potential  ?                          delayed complications were discussed with the  ?                          patient. Return to normal activities tomorrow.  ?                          Written discharge instructions were provided to the  ?                          patient. ?                          - Resume previous diet. ?                          - Continue present medications. ?                          -  Await pathology results. ?                          - Recommend repeat colonoscopy 6 months for  ?                          reassessment of IC valve polypectomy site given  ?                          large polyp with recurrence there in the past and  ?                          piecemeal fashion of removal of this recent lesion ?Remo Lipps P. Havery Moros, MD ?08/30/2021 2:53:39 PM ?This report has been signed electronically. ?

## 2021-08-30 NOTE — Patient Instructions (Signed)
Handouts given for hemorrhoids, polyps and diverticulosis. ? ?Resume previous diet.  Await pathology results.  Recommend repeat colonoscopy in 6 months for reassessment of IC valve polypectomy site given large polyp with recurrence there in the past. ? ?YOU HAD AN ENDOSCOPIC PROCEDURE TODAY AT [facility]:   Refer to the procedure report that was given to you for any specific questions about what was found during the examination.  If the procedure report does not answer your questions, please call your gastroenterologist to clarify.  If you requested that your care partner not be given the details of your procedure findings, then the procedure report has been included in a sealed envelope for you to review at your convenience later. ? ?YOU SHOULD EXPECT: Some feelings of bloating in the abdomen. Passage of more gas than usual.  Walking can help get rid of the air that was put into your GI tract during the procedure and reduce the bloating. If you had a lower endoscopy (such as a colonoscopy or flexible sigmoidoscopy) you may notice spotting of blood in your stool or on the toilet paper. If you underwent a bowel prep for your procedure, you may not have a normal bowel movement for a few days. ? ?Please Note:  You might notice some irritation and congestion in your nose or some drainage.  This is from the oxygen used during your procedure.  There is no need for concern and it should clear up in a day or so. ? ?SYMPTOMS TO REPORT IMMEDIATELY: ? ?Following lower endoscopy (colonoscopy or flexible sigmoidoscopy): ? Excessive amounts of blood in the stool ? Significant tenderness or worsening of abdominal pains ? Swelling of the abdomen that is new, acute ? Fever of 100?F or higher ? ?FFor urgent or emergent issues, a gastroenterologist can be reached at any hour by calling [phone]. ?Do not use MyChart messaging for urgent concerns.  ? ? ?DIET:  We do recommend a small meal at first, but then you  may proceed to your regular diet.  Drink plenty of fluids but you should avoid alcoholic beverages for 24 hours. ? ?ACTIVITY:  You should plan to take it easy for the rest of today and you should NOT DRIVE or use heavy machinery until tomorrow (because of the sedation medicines used during the test).   ? ?FOLLOW UP: ?Our staff will call the number listed on your records 48-72 hours following your procedure to check on you and address any questions or concerns that you may have regarding the information given to you following your procedure. If we do not reach you, we will leave a message.  We will attempt to reach you two times.  During this call, we will ask if you have developed any symptoms of COVID 19. If you develop any symptoms (ie: fever, flu-like symptoms, shortness of breath, cough etc.) before then, please call [phone].  If you test positive for Covid 19 in the 2 weeks post procedure, please call and report this information to [place].   ? ?If any biopsies were taken you will be contacted by phone or by letter within the next 1-3 weeks.  Please call us at [phone] if you have not heard about the biopsies in 3 weeks.  ? ? ?SIGNATURES/CONFIDENTIALITY: ?You and/or your care partner have signed paperwork which will be entered into your electronic medical record.  These signatures attest to the fact that that the information above on your After Visit Summary has been reviewed  and is understood.  Full responsibility of the confidentiality of this discharge information lies with you and/or your care-partner.  ?

## 2021-09-03 ENCOUNTER — Telehealth: Payer: Self-pay

## 2021-09-03 NOTE — Telephone Encounter (Signed)
?  Follow up Call- ? ?Call back number 08/30/2021  ?Post procedure Call Back phone  # [phone]  ?Permission to leave phone message Yes  ?Some recent data might be hidden  ?  ? ?Patient questions: ? ?Do you have a fever, pain , or abdominal swelling? No. ?Pain Score  0 * ? ?Have you tolerated food without any problems? Yes.   ? ?Have you been able to return to your normal activities? Yes.   ? ?Do you have any questions about your discharge instructions: ?Diet   No. ?Medications  No. ?Follow up visit  No. ? ?Do you have questions or concerns about your Care? No. ? ?Actions: ?* If pain score is 4 or above: ?No action needed, pain <4. ? ? ?

## 2021-09-04 ENCOUNTER — Ambulatory Visit (INDEPENDENT_AMBULATORY_CARE_PROVIDER_SITE_OTHER): Payer: PPO | Admitting: Pharmacist

## 2021-09-04 DIAGNOSIS — E782 Mixed hyperlipidemia: Secondary | ICD-10-CM

## 2021-09-04 DIAGNOSIS — I2510 Atherosclerotic heart disease of native coronary artery without angina pectoris: Secondary | ICD-10-CM

## 2021-09-04 NOTE — Patient Instructions (Signed)
Visit Information ? ?Following are the goals we discussed today:  ?Current Barriers:  ?Unable to independently afford treatment regimen ?Unable to maintain control of HLD ? ?Pharmacist Clinical Goal(s):  ?patient will verbalize ability to afford treatment regimen ?maintain control of HLD as evidenced by [place]  through collaboration with PharmD and provider.  ? ?Interventions: ?1:1 collaboration with Dettinger, Fransisca Kaufmann, MD regarding development and update of comprehensive plan of care as evidenced by provider attestation and co-signature ?Inter-disciplinary care team collaboration (see longitudinal plan of care) ?Comprehensive medication review performed; medication list updated in electronic medical record ? ?Hyperlipidemia:  Goal on Track (progressing): YES. ?Uncontrolled; current treatment:NEZLIZET;  ?Copay >$100/month --patient enrolled in [employer]well grant; covers copay at $0/month (will reapply next year) ?Patient tolerating therapy, denies side effects ?Was only taking every 2-3 days due to cost ?Will recheck lipids at next pcp f/u ?Medications previously tried: atorvastatin, rosuvastatin  ?hx of CAD with occluded RCA treated medically, hypertension and history of tobacco use (stable on spiriva-COPD) ?Current dietary patterns: heart healthy diet ?Mediterranean diet, DASH diet, low-carbohydrate diet and avoidance of trans fats  ?Assessed patient finances. Enrolled in [place] ? ?Lipid Panel  ?   ?Component Value Date/Time  ? CHOL 173 09/03/2020 0950  ? TRIG 103 09/03/2020 0950  ? HDL 50 09/03/2020 0950  ? CHOLHDL 3.5 09/03/2020 0950  ? CHOLHDL 5.5 03/08/2013 0509  ? VLDL 22 03/08/2013 0509  ? [place] 104 (H) 09/03/2020 0950  ? LABVLDL 19 09/03/2020 0950  ?Care Activities ?patient will:  ?- take medications as prescribed as evidenced by patient report and record review ?collaborate with provider on medication access solutions ?engage in dietary modifications by HEART  HEALTHY DIET ? ? ?Plan: Telephone follow up appointment with care management team member scheduled for:  04/2022 ? ?Signature ?Regina Eck, PharmD, BCPS ?Clinical Pharmacist, [facility] ?[place]  [place] Phone [phone] ? ? ?Please call the care guide team at [phone] if you need to cancel or reschedule your appointment.  ? ?The patient verbalized understanding of instructions, educational materials, and care plan provided today and declined offer to receive copy of patient instructions, educational materials, and care plan.  ? ?

## 2021-09-04 NOTE — Progress Notes (Signed)
? ? ?Chronic Care Management ?Pharmacy Note ? ?09/04/2021 ?Name:  [name] [name] MRN:  [id number] DOB:  04/18/1951 ? ?Summary: hld  ? ?Recommendations/Changes made from today's visit: ? ?Hyperlipidemia:  Goal on Track (progressing): YES. ?Uncontrolled; current treatment:NEXLIZET;  ?Copay >$100/month --patient enrolled in healthwell grant; covers copay at $0/month (will reapply next year) ?Patient tolerating therapy, denies side effects ?Was only taking every 2-3 days due to cost ?Will recheck lipids at next pcp f/u ?Medications previously tried: atorvastatin, rosuvastatin  ?hx of CAD with occluded RCA treated medically, hypertension and history of tobacco use (stable on spiriva-COPD) ?Current dietary patterns: heart healthy diet ?Mediterranean diet, DASH diet, low-carbohydrate diet and avoidance of trans fats  ?Assessed patient finances. Enrolled in [place] ? ?Lipid Panel  ?   ?Component Value Date/Time  ? CHOL 173 09/03/2020 0950  ? TRIG 103 09/03/2020 0950  ? HDL 50 09/03/2020 0950  ? CHOLHDL 3.5 09/03/2020 0950  ? CHOLHDL 5.5 03/08/2013 0509  ? VLDL 22 03/08/2013 0509  ? [place] 104 (H) 09/03/2020 0950  ? LABVLDL 19 09/03/2020 0950  ? ?Subjective: ?[name] [name] is an 71 y.o. year old male who is a primary patient of [name], [name] Kaufmann, MD.  The CCM team was consulted for assistance with disease management and care coordination needs.   ? ?Engaged with patient by telephone for follow up visit in response to provider referral for pharmacy case management and/or care coordination services.  ? ?Consent to Services:  ?The patient was given information about Chronic Care Management services, agreed to services, and gave verbal consent prior to initiation of services.  Please see initial visit note for detailed documentation.  ? ?Patient Care Team: ?[name], [name] Kaufmann, MD as PCP - General (Family Medicine) ?Minus Breeding, MD as PCP - Cardiology (Cardiology) ?Minus Breeding, MD as  Consulting Physician (Cardiology) ?Ilean China, RN as Equities trader ?Lavera Guise, [facility] (Pharmacist) ?Michael Boston, MD as Consulting Physician (General Surgery) ?Armbruster, Carlota Raspberry, MD as Consulting Physician (Gastroenterology) ? ? ?Objective: ? ?Lab Results  ?Component Value Date  ? CREATININE 1.30 (H) 05/30/2021  ? CREATININE 1.25 05/07/2021  ? CREATININE 1.21 09/03/2020  ? ? ?Lab Results  ?Component Value Date  ? HGBA1C 5.9 (H) 07/13/2017  ? ?Last diabetic Eye exam: No results found for: HMDIABEYEEXA  ?Last diabetic Foot exam: No results found for: HMDIABFOOTEX  ? ?   ?Component Value Date/Time  ? CHOL 173 09/03/2020 0950  ? TRIG 103 09/03/2020 0950  ? HDL 50 09/03/2020 0950  ? CHOLHDL 3.5 09/03/2020 0950  ? CHOLHDL 5.5 03/08/2013 0509  ? VLDL 22 03/08/2013 0509  ? [place] 104 (H) 09/03/2020 0950  ? ? ?Hepatic Function Latest Ref Rng & Units 09/03/2020 03/09/2020 11/04/2018  ?Total Protein 6.0 - 8.5 g/dL 7.4 7.4 7.4  ?Albumin 3.8 - 4.8 g/dL 4.4 4.7 4.4  ?AST 0 - 40 IU/L '24 20 18  ' ?ALT 0 - 44 IU/L '17 12 13  ' ?Alk Phosphatase 44 - 121 IU/L 111 125(H) 134(H)  ?Total Bilirubin 0.0 - 1.2 mg/dL 0.4 0.5 0.2  ?Bilirubin, Direct 0.00 - 0.40 mg/dL - - -  ? ? ?Lab Results  ?Component Value Date/Time  ? TSH 2.170 02/28/2021 04:09 PM  ? TSH 1.700 09/03/2020 09:50 AM  ? FREET4 1.02 08/06/2017 03:04 PM  ? FREET4 1.11 03/06/2015 09:31 AM  ? ? ?CBC Latest Ref Rng & Units 05/30/2021 09/03/2020 03/09/2020  ?WBC 3.4 - 10.8 x10E3/uL - 7.9 10.7  ?Hemoglobin 13.0 -  17.0 g/dL 17.0 15.9 16.6  ?Hematocrit 39.0 - 52.0 % 50.0 46.5 48.6  ?Platelets 150 - 450 x10E3/uL - 248 204  ? ? ?Lab Results  ?Component Value Date/Time  ? VD25OH 44.9 07/13/2017 09:30 AM  ? ? ?Clinical ASCVD: No  ?The ASCVD Risk score (Arnett DK, et al., 2019) failed to calculate for the following reasons: ?  The valid systolic blood pressure range is 90 to 200 mmHg   ? ?Other: (CHADS2VASc if Afib, PHQ9 if depression, MMRC or CAT for COPD, ACT, DEXA) ? ?Social History   ? ?Tobacco Use  ?Smoking Status Every Day  ? Packs/day: 1.50  ? Years: 58.00  ? Pack years: 87.00  ? Types: Cigarettes  ?Smokeless Tobacco Never  ? ?BP Readings from Last 3 Encounters:  ?08/30/21 (!) 85/46  ?07/29/21 (!) 141/86  ?07/25/21 (!) 164/79  ? ?Pulse Readings from Last 3 Encounters:  ?08/30/21 73  ?07/29/21 86  ?07/25/21 83  ? ?Wt Readings from Last 3 Encounters:  ?08/30/21 146 lb (66.2 kg)  ?08/16/21 146 lb (66.2 kg)  ?07/29/21 146 lb (66.2 kg)  ? ? ?Assessment: Review of patient past medical history, allergies, medications, health status, including review of consultants reports, laboratory and other test data, was performed as part of comprehensive evaluation and provision of chronic care management services.  ? ?SDOH:  (Social Determinants of Health) assessments and interventions performed:  ? ? ?CCM Care Plan ? ?Allergies  ?Allergen Reactions  ? Crestor [Rosuvastatin Calcium] Other (See Comments)  ?  Lethargy and muscle aches  ? Flomax [Tamsulosin Hcl] Other (See Comments)  ?  Made patient hypotensive, clammy, sweaty, and feel like he was going to pass out (EMS HAD TO BE CALLED)  ? Lipitor [Atorvastatin Calcium] Other (See Comments)  ?  Body cramps, lethargy and muscle aches  ? ? ?Medications Reviewed Today   ? ? Reviewed by Yetta Flock, MD (Physician) on 08/30/21 at 1357  Med List Status: <None>  ? ?Medication Order Taking? Sig Documenting Provider Last Dose Status Informant  ?0.9 %  sodium chloride infusion [id number]   Armbruster, Carlota Raspberry, MD  Active   ?albuterol (VENTOLIN HFA) 108 (90 Base) MCG/ACT inhaler [id number] Yes INHALE 2 PUFFS EVERY 4 HOURS AS NEEDED FOR WHEEZING OR SHORTNESS OF BREATH  ?Patient taking differently: 2 puffs every 4 (four) hours as needed.  ? [name], [name] Kaufmann, MD 08/30/2021 Active Self  ?aspirin EC 81 MG tablet [id number] Yes Take 1 tablet (81 mg total) by mouth daily. Swallow whole. Warren Lacy, PA-C Past Week Active   ?benzonatate (TESSALON) 100 MG capsule  [id number] No Take 1 capsule (100 mg total) by mouth 2 (two) times daily as needed for cough. Hassell Done, [name]-Margaret, FNP Unknown Active   ?bisacodyl (DULCOLAX) 5 MG EC tablet [id number] Yes Take 5 mg by mouth at bedtime. [provider] Past Week Active Self  ?levothyroxine (SYNTHROID) 50 MCG tablet [id number] Yes TAKE 1 TABLET DAILY  ?Patient taking differently: Take 50 mcg by mouth daily before breakfast.  ? [name], [name] Kaufmann, MD 08/30/2021 Active Self  ?lisinopril (ZESTRIL) 20 MG tablet [id number] Yes TAKE 1 TABLET DAILY  ?Patient taking differently: Take 20 mg by mouth at bedtime.  ? [name], [name] Kaufmann, MD 08/29/2021 Active Self  ?multivitamin (ONE-A-DAY MEN'S) TABS tablet [id number] No Take 1 tablet by mouth daily. [provider] Unknown Active Self  ?NEXLIZET 180-10 MG TABS [id number] Yes TAKE 1 TABLET DAILY [name], [name] Kaufmann, MD 08/29/2021 Active   ?nitroGLYCERIN (NITROSTAT)  0.4 MG SL tablet [id number]  Place 1 tablet (0.4 mg total) under the tongue every 5 (five) minutes as needed for chest pain.  ?Patient not taking: Reported on 08/16/2021  ? [name], [name] Kaufmann, MD  Expired 05/28/21 2359   ?omeprazole (PRILOSEC) 20 MG capsule [id number] Yes Take 1 capsule (20 mg total) by mouth daily.  ?Patient taking differently: Take 20 mg by mouth at bedtime.  ? [name], [name] Kaufmann, MD Past Week Active Self  ?Probiotic Product (ALIGN) 4 MG CAPS [id number] Yes Take 1 capsule by mouth daily. [provider] Past Week Active Self  ?SPIRIVA HANDIHALER 18 MCG inhalation capsule [id number] No INHALE THE CONTENTS OF 1 CAPSULE ONCE DAILY  ?Patient taking differently: 18 mcg daily.  ? [name], [name] Kaufmann, MD Unknown Active Self  ?traMADol (ULTRAM) 50 MG tablet [id number] No Take 1-2 tablets (50-100 mg total) by mouth every 6 (six) hours as needed for moderate pain or severe pain. Michael Boston, MD Unknown Active   ? ?  ?  ? ?  ? ? ?Patient Active Problem List  ? Diagnosis Date Noted  ? 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 GERD (gastroesophageal reflux disease) 04/25/2019  ? COPD (chronic obstructive pulmonary disease) ([place]) 04/25/2019  ? Scrotal mass 10/30/2017  ? Coron

## 2021-09-20 DIAGNOSIS — I2510 Atherosclerotic heart disease of native coronary artery without angina pectoris: Secondary | ICD-10-CM

## 2021-09-20 DIAGNOSIS — E782 Mixed hyperlipidemia: Secondary | ICD-10-CM

## 2021-11-11 ENCOUNTER — Other Ambulatory Visit: Payer: Self-pay | Admitting: Family Medicine

## 2021-11-16 ENCOUNTER — Other Ambulatory Visit: Payer: Self-pay | Admitting: Family Medicine

## 2021-11-16 DIAGNOSIS — I10 Essential (primary) hypertension: Secondary | ICD-10-CM

## 2021-12-26 ENCOUNTER — Telehealth: Payer: Self-pay | Admitting: Family Medicine

## 2021-12-26 DIAGNOSIS — I10 Essential (primary) hypertension: Secondary | ICD-10-CM

## 2021-12-26 NOTE — Telephone Encounter (Signed)
Dettinger NTBS 30 days given 11/19/21

## 2021-12-27 ENCOUNTER — Other Ambulatory Visit: Payer: Self-pay | Admitting: Family Medicine

## 2021-12-27 ENCOUNTER — Encounter: Payer: Self-pay | Admitting: Family Medicine

## 2021-12-27 DIAGNOSIS — I10 Essential (primary) hypertension: Secondary | ICD-10-CM

## 2021-12-27 NOTE — Telephone Encounter (Signed)
Called to schedule appt, no answer. Letter sent.  

## 2021-12-27 NOTE — Telephone Encounter (Signed)
Pt returned missed call. Pt scheduled an appt to see PCP for med refill on 02/06/22. Aware that PCP has to see him before he can get anymore refills.

## 2022-01-01 ENCOUNTER — Ambulatory Visit: Payer: Self-pay | Admitting: *Deleted

## 2022-01-01 DIAGNOSIS — E782 Mixed hyperlipidemia: Secondary | ICD-10-CM

## 2022-01-01 DIAGNOSIS — I10 Essential (primary) hypertension: Secondary | ICD-10-CM

## 2022-01-01 NOTE — Patient Instructions (Signed)
[name] [name]  I have enjoyed working with you through the Chronic Care Management Program at [facility]. Due to program changes and no recent contact with the [facility], I am removing myself from your care team. If you are currently active with another CCM Team Member, you will remain active with them unless they reach out to you with additional information. If you feel that you need RN Care Management services in the future, please talk with your primary care provider to discuss re-engagement with the RN Care Manager.   Thank you for allowing me to participate in your your healthcare journey.  Chong Sicilian, BSN, RN-BC Embedded Chronic Care Manager [facility] [place] [facility] Direct Dial: [phone]

## 2022-01-01 NOTE — Chronic Care Management (AMB) (Signed)
  Chronic Care Management   Note  01/01/2022 Name: [name] [name] MRN: [id number] DOB: February 21, 1951   Patient has not recently engaged with the Chronic Care Management RN Care Manager. Removing RN Care Manager from Care Team and closing [place]. If patient is currently engaged with another CCM team member I will forward this encounter to inform them of my case closure. Patient may be eligible for re-engagement with RN Care Manager in the future if necessary and can discuss this with their PCP.  Chong Sicilian, BSN, RN-BC Embedded Chronic Care Manager [facility] [place] Management Direct Dial: [phone]

## 2022-01-28 ENCOUNTER — Other Ambulatory Visit: Payer: Self-pay | Admitting: Family Medicine

## 2022-02-06 ENCOUNTER — Ambulatory Visit (INDEPENDENT_AMBULATORY_CARE_PROVIDER_SITE_OTHER): Payer: PPO | Admitting: Family Medicine

## 2022-02-06 ENCOUNTER — Encounter: Payer: Self-pay | Admitting: Family Medicine

## 2022-02-06 VITALS — BP 126/63 | HR 55 | Temp 97.9°F | Ht 70.0 in | Wt 142.0 lb

## 2022-02-06 DIAGNOSIS — E039 Hypothyroidism, unspecified: Secondary | ICD-10-CM

## 2022-02-06 DIAGNOSIS — K219 Gastro-esophageal reflux disease without esophagitis: Secondary | ICD-10-CM

## 2022-02-06 DIAGNOSIS — I10 Essential (primary) hypertension: Secondary | ICD-10-CM | POA: Diagnosis not present

## 2022-02-06 DIAGNOSIS — E782 Mixed hyperlipidemia: Secondary | ICD-10-CM

## 2022-02-06 DIAGNOSIS — I25119 Atherosclerotic heart disease of native coronary artery with unspecified angina pectoris: Secondary | ICD-10-CM

## 2022-02-06 DIAGNOSIS — J449 Chronic obstructive pulmonary disease, unspecified: Secondary | ICD-10-CM | POA: Diagnosis not present

## 2022-02-06 MED ORDER — LEVOTHYROXINE SODIUM 50 MCG PO TABS: 50.0000 ug | ORAL_TABLET | Freq: Every day | ORAL | 3 refills | Status: DC

## 2022-02-06 MED ORDER — NEXLIZET 180-10 MG PO TABS: 1.0000 | ORAL_TABLET | Freq: Every day | ORAL | 3 refills | Status: DC

## 2022-02-06 MED ORDER — OMEPRAZOLE 20 MG PO CPDR: 20.0000 mg | DELAYED_RELEASE_CAPSULE | Freq: Every day | ORAL | 3 refills | Status: DC

## 2022-02-06 MED ORDER — LISINOPRIL 20 MG PO TABS: 20.0000 mg | ORAL_TABLET | Freq: Every day | ORAL | 3 refills | Status: DC

## 2022-02-06 NOTE — Progress Notes (Signed)
BP 126/63   Pulse (!) 55   Temp 97.9 F (36.6 C)   Ht '5\' 10"'  (1.778 m)   Wt 142 lb (64.4 kg)   SpO2 94%   BMI 20.37 kg/m    Subjective:   Patient ID: [name] [name], male    DOB: 1950-11-17, 71 y.o.   MRN: [id number]  HPI: [name] [name] is a 71 y.o. male presenting on 02/06/2022 for Medical Management of Chronic Issues, Hyperlipidemia, Hypertension, and Hypothyroidism   HPI Hypothyroidism recheck Patient is coming in for thyroid recheck today as well. They deny any issues with hair changes or heat or cold problems or diarrhea or constipation. They deny any chest pain or palpitations. They are currently on levothyroxine 50 micrograms   COPD Patient is coming in for COPD recheck today.  He is currently on Spiriva.  He has a mild chronic cough but denies any major coughing spells or wheezing spells.  He has mild congestion sometimes and it does flareup sometimes and he does especially feel like in the later afternoons that the Spiriva just wears off and he does not have anything for the afternoons.   Hyperlipidemia and CAD Patient is coming in for recheck of his hyperlipidemia. The patient is currently taking Nexlizet. They deny any issues with myalgias or history of liver damage from it. They deny any focal numbness or weakness or chest pain.   Hypertension Patient is currently on lisinopril, and their blood pressure today is 126/63. Patient denies any lightheadedness or dizziness. Patient denies headaches, blurred vision, chest pains, shortness of breath, or weakness. Denies any side effects from medication and is content with current medication.   GERD Patient is currently on omeprazole.  She denies any major symptoms or abdominal pain or belching or burping. She denies any blood in her stool or lightheadedness or dizziness.   Relevant past medical, surgical, family and social history reviewed and updated as indicated. Interim medical history since our last visit  reviewed. Allergies and medications reviewed and updated.  Review of Systems  Constitutional:  Negative for chills and fever.  Eyes:  Negative for visual disturbance.  Respiratory:  Positive for cough and shortness of breath. Negative for wheezing.   Cardiovascular:  Negative for chest pain and leg swelling.  Musculoskeletal:  Negative for back pain and gait problem.  Skin:  Negative for rash.  Neurological:  Negative for dizziness, weakness and light-headedness.  All other systems reviewed and are negative.   Per HPI unless specifically indicated above   Allergies as of 02/06/2022       Reactions   Crestor [rosuvastatin Calcium] Other (See Comments)   Lethargy and muscle aches   Flomax [tamsulosin Hcl] Other (See Comments)   Made patient hypotensive, clammy, sweaty, and feel like he was going to pass out (EMS HAD TO BE CALLED)   Lipitor [atorvastatin Calcium] Other (See Comments)   Body cramps, lethargy and muscle aches        Medication List        Accurate as of February 06, 2022  3:55 PM. If you have any questions, ask your nurse or doctor.          albuterol 108 (90 Base) MCG/ACT inhaler Commonly known as: VENTOLIN HFA INHALE 2 PUFFS EVERY 4 HOURS AS NEEDED FOR WHEEZING OR SHORTNESS OF BREATH What changed: See the new instructions.   Align 4 MG Caps Take 1 capsule by mouth daily.   aspirin EC 81 MG tablet Take 1  tablet (81 mg total) by mouth daily. Swallow whole.   benzonatate 100 MG capsule Commonly known as: TESSALON Take 1 capsule (100 mg total) by mouth 2 (two) times daily as needed for cough.   bisacodyl 5 MG EC tablet Commonly known as: DULCOLAX Take 5 mg by mouth at bedtime.   levothyroxine 50 MCG tablet Commonly known as: SYNTHROID Take 1 tablet (50 mcg total) by mouth daily. What changed: when to take this   lisinopril 20 MG tablet Commonly known as: ZESTRIL Take 1 tablet (20 mg total) by mouth daily.   multivitamin Tabs tablet Take 1  tablet by mouth daily.   Nexlizet 180-10 MG Tabs Generic drug: Bempedoic Acid-Ezetimibe Take 1 tablet by mouth daily.   nitroGLYCERIN 0.4 MG SL tablet Commonly known as: NITROSTAT Place 1 tablet (0.4 mg total) under the tongue every 5 (five) minutes as needed for chest pain.   omeprazole 20 MG capsule Commonly known as: PRILOSEC Take 1 capsule (20 mg total) by mouth daily. What changed: when to take this   Spiriva HandiHaler 18 MCG inhalation capsule Generic drug: tiotropium INHALE THE CONTENTS OF 1 CAPSULE ONCE DAILY What changed: See the new instructions.   traMADol 50 MG tablet Commonly known as: ULTRAM Take 1-2 tablets (50-100 mg total) by mouth every 6 (six) hours as needed for moderate pain or severe pain.         Objective:   BP 126/63   Pulse (!) 55   Temp 97.9 F (36.6 C)   Ht '5\' 10"'  (1.778 m)   Wt 142 lb (64.4 kg)   SpO2 94%   BMI 20.37 kg/m   Wt Readings from Last 3 Encounters:  02/06/22 142 lb (64.4 kg)  08/30/21 146 lb (66.2 kg)  08/16/21 146 lb (66.2 kg)    Physical Exam Vitals and nursing note reviewed.  Constitutional:      General: He is not in acute distress.    Appearance: He is well-developed. He is not diaphoretic.  Eyes:     General: No scleral icterus.    Conjunctiva/sclera: Conjunctivae normal.  Neck:     Thyroid: No thyromegaly.  Cardiovascular:     Rate and Rhythm: Normal rate and regular rhythm.     Heart sounds: Normal heart sounds. No murmur heard. Pulmonary:     Effort: Pulmonary effort is normal. No respiratory distress.     Breath sounds: Wheezing (End expiratory wheeze in lower lobes) present. No rhonchi or rales.  Musculoskeletal:        General: No swelling. Normal range of motion.     Cervical back: Neck supple.  Lymphadenopathy:     Cervical: No cervical adenopathy.  Skin:    General: Skin is warm and dry.     Findings: No rash.  Neurological:     Mental Status: He is alert and oriented to person, place, and  time.     Coordination: Coordination normal.  Psychiatric:        Behavior: Behavior normal.       Assessment & Plan:   Problem List Items Addressed This Visit       Cardiovascular and Mediastinum   Essential hypertension - Primary   Relevant Medications   lisinopril (ZESTRIL) 20 MG tablet   Bempedoic Acid-Ezetimibe (NEXLIZET) 180-10 MG TABS   Other Relevant Orders   CBC with Differential/Platelet   CMP14+EGFR   Lipid panel   TSH   Coronary artery disease involving native coronary artery of native heart with angina  pectoris (HCC)   Relevant Medications   lisinopril (ZESTRIL) 20 MG tablet   Bempedoic Acid-Ezetimibe (NEXLIZET) 180-10 MG TABS   Other Relevant Orders   CBC with Differential/Platelet   CMP14+EGFR   Lipid panel     Respiratory   COPD (chronic obstructive pulmonary disease) (HCC)   Relevant Orders   CBC with Differential/Platelet     Digestive   GERD (gastroesophageal reflux disease)   Relevant Medications   omeprazole (PRILOSEC) 20 MG capsule   Other Relevant Orders   CBC with Differential/Platelet     Endocrine   Hypothyroidism   Relevant Medications   levothyroxine (SYNTHROID) 50 MCG tablet   Other Relevant Orders   TSH     Other   HLD (hyperlipidemia)   Relevant Medications   lisinopril (ZESTRIL) 20 MG tablet   Bempedoic Acid-Ezetimibe (NEXLIZET) 180-10 MG TABS   Other Relevant Orders   CBC with Differential/Platelet   CMP14+EGFR   Lipid panel   TSH  Gave sample for breast tree and sent to do 1 inhalation in the morning and 1 inhalation in the afternoon  Follow up plan: Return in about 6 months (around 08/09/2022), or if symptoms worsen or fail to improve, for Hypertension and hyperlipidemia and COPD.  Counseling provided for all of the vaccine components Orders Placed This Encounter  Procedures   CBC with Differential/Platelet   CMP14+EGFR   Lipid panel   TSH    Caryl Pina, MD [facility] 02/06/2022, 3:55 PM

## 2022-02-07 LAB — CBC WITH DIFFERENTIAL/PLATELET
Basophils Absolute: 0.1 10*3/uL (ref 0.0–0.2)
Basos: 1 %
EOS (ABSOLUTE): 0.2 10*3/uL (ref 0.0–0.4)
Eos: 2 %
Hematocrit: 46.6 % (ref 37.5–51.0)
Hemoglobin: 16.1 g/dL (ref 13.0–17.7)
Immature Grans (Abs): 0 10*3/uL (ref 0.0–0.1)
Immature Granulocytes: 0 %
Lymphocytes Absolute: 3.7 10*3/uL — ABNORMAL HIGH (ref 0.7–3.1)
Lymphs: 38 %
MCH: 31.4 pg (ref 26.6–33.0)
MCHC: 34.5 g/dL (ref 31.5–35.7)
MCV: 91 fL (ref 79–97)
Monocytes Absolute: 1 10*3/uL — ABNORMAL HIGH (ref 0.1–0.9)
Monocytes: 10 %
Neutrophils Absolute: 4.9 10*3/uL (ref 1.4–7.0)
Neutrophils: 49 %
Platelets: 241 10*3/uL (ref 150–450)
RBC: 5.12 x10E6/uL (ref 4.14–5.80)
RDW: 12.4 % (ref 11.6–15.4)
WBC: 9.8 10*3/uL (ref 3.4–10.8)

## 2022-02-07 LAB — CMP14+EGFR
ALT: 11 IU/L (ref 0–44)
AST: 14 IU/L (ref 0–40)
Albumin/Globulin Ratio: 1.4 (ref 1.2–2.2)
Albumin: 4.1 g/dL (ref 3.8–4.8)
Alkaline Phosphatase: 121 IU/L (ref 44–121)
BUN/Creatinine Ratio: 18 (ref 10–24)
BUN: 22 mg/dL (ref 8–27)
Bilirubin Total: 0.3 mg/dL (ref 0.0–1.2)
CO2: 23 mmol/L (ref 20–29)
Calcium: 9.2 mg/dL (ref 8.6–10.2)
Chloride: 103 mmol/L (ref 96–106)
Creatinine, Ser: 1.25 mg/dL (ref 0.76–1.27)
Globulin, Total: 2.9 g/dL (ref 1.5–4.5)
Glucose: 105 mg/dL — ABNORMAL HIGH (ref 70–99)
Potassium: 4.4 mmol/L (ref 3.5–5.2)
Sodium: 140 mmol/L (ref 134–144)
Total Protein: 7 g/dL (ref 6.0–8.5)
eGFR: 62 mL/min/{1.73_m2} (ref >59)

## 2022-02-07 LAB — LIPID PANEL
Chol/HDL Ratio: 3.4 ratio (ref 0.0–5.0)
Cholesterol, Total: 169 mg/dL (ref 100–199)
HDL: 49 mg/dL (ref >39)
LDL Chol Calc (NIH): 91 mg/dL (ref 0–99)
Triglycerides: 171 mg/dL — ABNORMAL HIGH (ref 0–149)
VLDL Cholesterol Cal: 29 mg/dL (ref 5–40)

## 2022-02-07 LAB — TSH: TSH: 2.22 u[IU]/mL (ref 0.450–4.500)

## 2022-02-19 ENCOUNTER — Encounter: Payer: Self-pay | Admitting: Family Medicine

## 2022-02-19 ENCOUNTER — Ambulatory Visit (INDEPENDENT_AMBULATORY_CARE_PROVIDER_SITE_OTHER): Payer: PPO | Admitting: Family Medicine

## 2022-02-19 VITALS — BP 128/71 | HR 82 | Temp 97.8°F | Ht 70.0 in | Wt 143.4 lb

## 2022-02-19 DIAGNOSIS — R300 Dysuria: Secondary | ICD-10-CM | POA: Diagnosis not present

## 2022-02-19 LAB — URINALYSIS, ROUTINE W REFLEX MICROSCOPIC
Bilirubin, UA: NEGATIVE
Glucose, UA: NEGATIVE
Ketones, UA: NEGATIVE
Leukocytes,UA: NEGATIVE
Nitrite, UA: NEGATIVE
Protein,UA: NEGATIVE
RBC, UA: NEGATIVE
Specific Gravity, UA: 1.01 (ref 1.005–1.030)
Urobilinogen, Ur: 0.2 mg/dL (ref 0.2–1.0)
pH, UA: 6.5 (ref 5.0–7.5)

## 2022-02-19 MED ORDER — CIPROFLOXACIN HCL 500 MG PO TABS: 500.0000 mg | ORAL_TABLET | Freq: Two times a day (BID) | ORAL | 0 refills | Status: AC

## 2022-02-19 NOTE — Progress Notes (Signed)
   Acute Office Visit  Subjective:     Patient ID: [name] [name], male    DOB: 04-Jan-1951, 71 y.o.   MRN: [id number]  Chief Complaint  Patient presents with   Dysuria    Dysuria  This is a new problem. Episode onset: 3 days ago. The problem occurs every urination. The problem has been gradually worsening. The quality of the pain is described as burning. There has been no fever. Associated symptoms include frequency and urgency. Pertinent negatives include no chills, discharge, flank pain, hematuria, hesitancy, nausea, sweats or vomiting. He has tried increased fluids for the symptoms. The treatment provided no relief. His past medical history is significant for kidney stones and recurrent UTIs.    Review of Systems  Constitutional:  Negative for chills.  Gastrointestinal:  Negative for nausea and vomiting.  Genitourinary:  Positive for dysuria, frequency and urgency. Negative for flank pain, hematuria and hesitancy.        Objective:    BP 128/71 Comment: at home per pt  Pulse 82   Temp 97.8 F (36.6 C) (Temporal)   Ht '5\' 10"'$  (1.778 m)   Wt 143 lb 6 oz (65 kg)   SpO2 97%   BMI 20.57 kg/m    Physical Exam Vitals and nursing note reviewed.  Constitutional:      General: He is not in acute distress.    Appearance: He is not ill-appearing, toxic-appearing or diaphoretic.  HENT:     Mouth/Throat:     Mouth: Mucous membranes are moist.     Pharynx: Oropharynx is clear.  Cardiovascular:     Rate and Rhythm: Normal rate and regular rhythm.     Heart sounds: Normal heart sounds. No murmur heard. Pulmonary:     Effort: Pulmonary effort is normal. No respiratory distress.     Breath sounds: Normal breath sounds.  Abdominal:     General: Bowel sounds are normal. There is no distension.     Palpations: Abdomen is soft.     Tenderness: There is no abdominal tenderness. There is no right CVA tenderness, left CVA tenderness, guarding or rebound.  Musculoskeletal:      Right lower leg: No edema.     Left lower leg: No edema.  Skin:    General: Skin is warm and dry.  Neurological:     General: No focal deficit present.     Mental Status: He is alert and oriented to person, place, and time.  Psychiatric:        Mood and Affect: Mood normal.        Behavior: Behavior normal.     No results found for any visits on 02/19/22.      Assessment & Plan:   Malic was seen today for dysuria.  Diagnoses and all orders for this visit:  Dysuria Negative UA today, however will treat empirically pending culture given reported symptoms and history of UTIs. Increased hydration. Return to office for new or worsening symptoms, or if symptoms persist.  -     Urinalysis, Routine w reflex microscopic -     Urine Culture -     ciprofloxacin (CIPRO) 500 MG tablet; Take 1 tablet (500 mg total) by mouth 2 (two) times daily for 5 days.  The patient indicates understanding of these issues and agrees with the plan.  Gwenlyn Perking, FNP

## 2022-02-19 NOTE — Patient Instructions (Signed)

## 2022-02-21 LAB — URINE CULTURE: Organism ID, Bacteria: NO GROWTH

## 2022-02-27 ENCOUNTER — Telehealth: Payer: Self-pay | Admitting: Family Medicine

## 2022-02-27 MED ORDER — BREZTRI AEROSPHERE 160-9-4.8 MCG/ACT IN AERO: 2.0000 | INHALATION_SPRAY | Freq: Two times a day (BID) | RESPIRATORY_TRACT | 11 refills | Status: DC

## 2022-02-27 NOTE — Telephone Encounter (Signed)
Pt has been informed. He has no further concerns.

## 2022-02-27 NOTE — Telephone Encounter (Signed)
Sent a prescription of Breztri for the patient

## 2022-02-27 NOTE — Telephone Encounter (Signed)
Dettinger,  Please verify if refill can be given. Historical provider prescribed in the past. Seen last by Dettinger 8/17. Pt does not have a follow up at this time.

## 2022-03-13 ENCOUNTER — Encounter: Payer: Self-pay | Admitting: Gastroenterology

## 2022-04-04 ENCOUNTER — Ambulatory Visit (AMBULATORY_SURGERY_CENTER): Payer: Self-pay

## 2022-04-04 VITALS — Ht 70.0 in | Wt 146.0 lb

## 2022-04-04 DIAGNOSIS — Z86010 Personal history of colonic polyps: Secondary | ICD-10-CM

## 2022-04-04 MED ORDER — NA SULFATE-K SULFATE-MG SULF 17.5-3.13-1.6 GM/177ML PO SOLN: 1.0000 | Freq: Once | ORAL | 0 refills | Status: AC

## 2022-04-04 NOTE — Progress Notes (Signed)
No egg or soy allergy known to patient  No issues known to pt with past sedation with any surgeries or procedures Patient denies ever being told they had issues or difficulty with intubation  No FH of Malignant Hyperthermia Pt is not on diet pills Pt is not on  home 02  Pt is not on blood thinners  Pt denies issues with constipation  No A fib or A flutter Have any cardiac testing pending--no Pt instructed to use Singlecare.com or GoodRx for a price reduction on prep   

## 2022-04-15 ENCOUNTER — Ambulatory Visit (INDEPENDENT_AMBULATORY_CARE_PROVIDER_SITE_OTHER): Payer: PPO

## 2022-04-15 DIAGNOSIS — Z0000 Encounter for general adult medical examination without abnormal findings: Secondary | ICD-10-CM

## 2022-04-15 NOTE — Progress Notes (Signed)
Subjective:   [name] [name] is a 71 y.o. male who presents for Medicare Annual/Subsequent preventive examination.  I connected with  [name] [name] on 04/15/22 by a audio enabled telemedicine application and verified that I am speaking with the correct person using two identifiers.  Patient Location: Home  Provider Location: Office/Clinic  I discussed the limitations of evaluation and management by telemedicine. The patient expressed understanding and agreed to proceed.   Review of Systems    Defer to PCP   Cardiac Risk Factors include: advanced age (>29mn, >>16women);hypertension;male gender;smoking/ tobacco exposure     Objective:    There were no vitals filed for this visit. There is no height or weight on file to calculate BMI.     04/15/2022    9:10 AM 05/30/2021    7:49 AM 04/10/2021    4:13 PM 07/08/2019    8:50 AM 07/05/2018    9:06 AM 08/04/2017   12:05 PM 06/29/2017    8:52 AM  Advanced Directives  Does Patient Have a Medical Advance Directive? No No No No No No No  Would patient like information on creating a medical advance directive? No - Patient declined No - Patient declined No - Patient declined No - Patient declined Yes (MAU/Ambulatory/Procedural Areas - Information given) No - Patient declined No - Patient declined    Current Medications (verified) Outpatient Encounter Medications as of 04/15/2022  Medication Sig   albuterol (VENTOLIN HFA) 108 (90 Base) MCG/ACT inhaler INHALE 2 PUFFS EVERY 4 HOURS AS NEEDED FOR WHEEZING OR SHORTNESS OF BREATH (Patient taking differently: 2 puffs every 4 (four) hours as needed.)   aspirin EC 81 MG tablet Take 1 tablet (81 mg total) by mouth daily. Swallow whole.   Bempedoic Acid-Ezetimibe (NEXLIZET) 180-10 MG TABS Take 1 tablet by mouth daily.   bisacodyl (DULCOLAX) 5 MG EC tablet Take 5 mg by mouth daily as needed.   Budeson-Glycopyrrol-Formoterol (BREZTRI AEROSPHERE) 160-9-4.8 MCG/ACT AERO Inhale 2 puffs into the  lungs 2 (two) times daily.   levothyroxine (SYNTHROID) 50 MCG tablet Take 1 tablet (50 mcg total) by mouth daily.   lisinopril (ZESTRIL) 20 MG tablet Take 1 tablet (20 mg total) by mouth daily.   nitroGLYCERIN (NITROSTAT) 0.4 MG SL tablet Place 1 tablet (0.4 mg total) under the tongue every 5 (five) minutes as needed for chest pain.   omeprazole (PRILOSEC) 20 MG capsule Take 1 capsule (20 mg total) by mouth daily.   multivitamin (ONE-A-DAY MEN'S) TABS tablet Take 1 tablet by mouth daily. (Patient not taking: Reported on 04/15/2022)   traMADol (ULTRAM) 50 MG tablet Take 1-2 tablets (50-100 mg total) by mouth every 6 (six) hours as needed for moderate pain or severe pain. (Patient not taking: Reported on 04/04/2022)   [DISCONTINUED] Probiotic Product (ALIGN) 4 MG CAPS Take 1 capsule by mouth daily. (Patient not taking: Reported on 04/04/2022)   No facility-administered encounter medications on file as of 04/15/2022.    Allergies (verified) Crestor [rosuvastatin calcium], Flomax [tamsulosin hcl], and Lipitor [atorvastatin calcium]   History: Past Medical History:  Diagnosis Date   Arthritis    Carotid artery stenosis without cerebral infarction    vascular --- dr dScot Dock   09-24-2007  s/p right CEA   Chronic cholecystitis    w/  large calcified stone in gallbladder   Chronic constipation    [place]    followed by pcp----  (05-28-2021  pt stated last used rescue 05-27-2021 for wheezing  resolved after use, stated exacerbation approx  greater than a year ago)   Coronary artery disease 2002   cardiologist-- dr Percival Spanish ;  (05-28-2021  pt denies need to use nitro for CP) per last cath 08-04-2017  LM luminal irregularities, LAD 30% ,D1 ostial 35% ,CFx   AV groove ostial 25% , OM1 long prox 30%,  proxRCA occluded and dominant,   excellent left to right collateral filling.   Depression    Patient denies   DOE (dyspnea on exertion)    due to smoking/  copd   Dyslipidemia    Essential hypertension    Full dentures    GERD (gastroesophageal reflux disease)    History of adenomatous polyp of colon    History of COVID-19    per pt winter 2022, mild to moderate symptoms that resolved   History of diverticulitis of colon    History of kidney stones    Hyperlipidemia    Hypothyroidism    followed by pcp   Lung nodule    s/p bronchoscopy w/ bx in 2005,  benign   Osteoporosis    Smokers' cough ([place])    05-28-2021  pt stated occasionally productive w/ white phlegm   Wears glasses    Past Surgical History:  Procedure Laterality Date   BRONCHOSCOPY  2005   CARDIAC CATHETERIZATION  08/11/2000   '@[facility]'$  by dr Lia Foyer;   preserved LVF,  ostial RCA 70-75%, other nonobstructive disease   CARDIAC CATHETERIZATION  03/30/2008   '@[facility]'$  by dr hochrein;  occluded RCA and other nonobstructive involving LAD, CFx, D1,OM1  and left to right collateral filling,  mild inferior hypokinesis,  ef 55%   CAROTID ENDARTERECTOMY Right 09/24/2007   '@[facility]'$  by dr Scot Dock   COLONOSCOPY  01/19/2017   by armsbruster   EXCISION NEUROMA Left 10/01/2005   '@[facility]'$ ;  left inguinal   EXTRACORPOREAL SHOCK WAVE LITHOTRIPSY  2015   INGUINAL HERNIA REPAIR Left    01-13-2003 and 07-22-2006 both '@[facility]    LAPAROSCOPIC CHOLECYSTECTOMY SINGLE SITE WITH INTRAOPERATIVE CHOLANGIOGRAM N/A 05/30/2021   Procedure: LAPAROSCOPIC CHOLECYSTECTOMY SINGLE SITE WITH INTRAOPERATIVE CHOLANGIOGRAM;  Surgeon: Michael Boston, MD;  Location: [facility];  Service: General;  Laterality: N/A;   LAPAROSCOPIC INGUINAL HERNIA REPAIR Bilateral 04/28/2007   '@[facility]'$    LEFT HEART CATH AND CORONARY ANGIOGRAPHY N/A 08/04/2017   Procedure: LEFT HEART CATH AND CORONARY ANGIOGRAPHY;  Surgeon: Burnell Blanks, MD;  Location: [facility];  Service: Cardiovascular;  Laterality: N/A;   THROAT SURGERY  1997   pt stated removal of tumor that was benign   Family History  Problem Relation Age of Onset    Lupus Mother    Lung disease Father    Lupus Brother    Alcohol abuse Brother    Lung disease Brother    Colon polyps Brother    Colon cancer Brother 59   Cancer Brother        colon   Lung disease Brother    Healthy Daughter    Healthy Son    Healthy Son    Healthy Son    Esophageal cancer Neg Hx    Rectal cancer Neg Hx    Stomach cancer Neg Hx    Pancreatic cancer Neg Hx    Crohn's disease Neg Hx    Ulcerative colitis Neg Hx    Social History   Socioeconomic History   Marital status: Married    Spouse name: [place]   Number of children: 4  Years of education: high school diploma   Highest education level: 12th grade  Occupational History   Occupation: truck Education administrator: [employer]  Tobacco Use   Smoking status: Every Day    Packs/day: 1.50    Years: 58.00    Total pack years: 87.00    Types: Cigarettes   Smokeless tobacco: Never  Vaping Use   Vaping Use: Never used  Substance and Sexual Activity   Alcohol use: Yes    Alcohol/week: 2.0 standard drinks of alcohol    Types: 2 Standard drinks or equivalent per week   Drug use: No   Sexual activity: Yes    Birth control/protection: None  Other Topics Concern   Not on file  Social History Narrative   Lives with wife in a split level home.    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   Social Determinants of Health   Financial Resource Strain: Low Risk  (04/15/2022)   Overall Financial Resource Strain (CARDIA)    Difficulty of Paying Living Expenses: Not hard at all  Food Insecurity: No Food Insecurity (04/15/2022)   Hunger Vital Sign    Worried About Running Out of Food in the Last Year: Never true    Ran Out of Food in the Last Year: Never true  Transportation Needs: No Transportation Needs (04/15/2022)   PRAPARE - Radiographer, therapeutic (Medical): No    Lack of Transportation (Non-Medical): No  Physical Activity: Sufficiently Active (04/15/2022)   Exercise Vital Sign    Days of Exercise per Week: 7 days    Minutes of Exercise per Session: 90 min  Stress: No Stress Concern Present (04/15/2022)   [place]    Feeling of Stress : Not at all  Social Connections: [place] (04/15/2022)   Social Connection and Isolation Panel [NHANES]    Frequency of Communication with Friends and Family: Three times a week    Frequency of Social Gatherings with Friends and Family: Three times a week    Attends Religious Services: More than 4 times per year    Active Member of Clubs or Organizations: Yes    Attends Music therapist: More than 4 times per year    Marital Status: Married     Clinical Intake:  Pre-visit preparation completed: Yes  Pain : No/denies pain     Nutritional Risks: None Diabetes: No  How often do you need to have someone help you when you read instructions, pamphlets, or other written materials from your doctor or pharmacy?: 1 - Never What is the last grade level you completed in school?: 12th grade  Diabetic? No  Interpreter Needed?: No  Information entered by :: Felicity Coyer LPN   Activities of Daily Living    04/15/2022    9:10 AM 05/30/2021    7:55 AM  In your present state of health, do you have any difficulty performing the following activities:  Hearing? 0 0  Vision? 0 0  Difficulty concentrating or making decisions? 1 0  Comment normal memory issues as he ages   Walking or climbing stairs? 0 0  Dressing or bathing?  0  Doing errands, shopping? 0   Preparing Food and eating ? N  Using the Toilet? N   In the past six months, have you accidently leaked urine? N   Do you have problems with loss of bowel control? N   Managing your Medications? N   Managing your Finances? N    Housekeeping or managing your Housekeeping? N     Patient Care Team: Dettinger, Fransisca Kaufmann, MD as PCP - General (Family Medicine) Minus Breeding, MD as PCP - Cardiology (Cardiology) Minus Breeding, MD as Consulting Physician (Cardiology) Lavera Guise, [facility] (Pharmacist) Michael Boston, MD as Consulting Physician (General Surgery) Armbruster, Carlota Raspberry, MD as Consulting Physician (Gastroenterology)  Indicate any recent Medical Services you may have received from other than [facility] providers in the past year (date may be approximate).     Assessment:   This is a routine wellness examination for Teryl.  Hearing/Vision screen No results found.  Dietary issues and exercise activities discussed: Current Exercise Habits: The patient does not participate in regular exercise at present, Exercise limited by: None identified   Goals Addressed             This Visit's Progress    Patient Stated       04/15/2022 AWV Goal: Fall Prevention  Over the next year, patient will decrease their risk for falls by: Using assistive devices, such as a cane or walker, as needed Identifying fall risks within their home and correcting them by: Removing throw rugs Adding handrails to stairs or ramps Removing clutter and keeping a clear pathway throughout the home Increasing light, especially at night Adding shower handles/bars Raising toilet seat Identifying potential personal risk factors for falls: Medication side effects Incontinence/urgency Vestibular dysfunction Hearing loss Musculoskeletal disorders Neurological disorders Orthostatic hypotension          Depression Screen    04/15/2022    9:07 AM 02/19/2022    8:39 AM 02/06/2022    3:38 PM 07/29/2021   11:13 AM 07/25/2021    1:17 PM 04/10/2021    4:16 PM 02/28/2021    2:27 PM  PHQ 2/9 Scores  PHQ - 2 Score 0 0 0 0 0 0 0  PHQ- 9 Score 0 0  0  0 0    Fall Risk    04/15/2022    9:15 AM 02/19/2022    8:38 AM 02/06/2022    3:38  PM 07/29/2021   11:13 AM 07/25/2021    1:17 PM  [place] in the past year? 0 0 0 0 0  Follow up Falls evaluation completed        FALL RISK PREVENTION PERTAINING TO THE HOME:  Any stairs in or around the home? Yes  If so, are there any without handrails? Yes  Home free of loose throw rugs in walkways, pet beds, electrical cords, etc? No , patient does have throw rugs in living room but he reports they are heavy and do not move around. Adequate lighting in your home to reduce risk of falls? Yes   ASSISTIVE DEVICES UTILIZED TO PREVENT FALLS:  Life alert? No  Use of a cane, walker or w/c? No  Grab bars in the bathroom? Yes , in one of his bathrooms Shower chair or bench in shower? No  Elevated toilet seat or a handicapped toilet? No   TIMED UP AND GO:  Was the test performed? No .    Cognitive Function:    07/05/2018    9:38 AM 06/29/2017   11:59 AM  MMSE - Mini Mental State Exam  Orientation to time 5 5  Orientation to Place 5 5  Registration 3 3  Attention/ Calculation 5 3  Recall 3 3  Language- name 2 objects 2 2  Language- repeat 1 1  Language- follow 3 step command 3 3  Language- read & follow direction 1 1  Write a sentence 1 1  Copy design 1 1  Total score 30 28        04/15/2022    9:12 AM 07/08/2019    8:54 AM  6CIT Screen  What Year?  0 points  What month?  0 points  What time? 0 points 0 points  Count back from 20 0 points 0 points  Months in reverse 2 points 2 points  Repeat phrase 0 points 0 points  Total Score  2 points    Immunizations Immunization History  Administered Date(s) Administered   Pneumococcal Conjugate-13 01/07/2016   Pneumococcal Polysaccharide-23 06/29/2017    TDAP status: Due, Education has been provided regarding the importance of this vaccine. Advised may receive this vaccine at local pharmacy or Health Dept. Aware to provide a copy of the vaccination record if obtained from local pharmacy or Health Dept. Verbalized  acceptance and understanding.  Flu Vaccine status: Due, Education has been provided regarding the importance of this vaccine. Advised may receive this vaccine at local pharmacy or Health Dept. Aware to provide a copy of the vaccination record if obtained from local pharmacy or Health Dept. Verbalized acceptance and understanding.  Pneumococcal vaccine status: Up to date  Covid-19 vaccine status: Information provided on how to obtain vaccines.   Qualifies for Shingles Vaccine? Yes   Zostavax completed No   Shingrix Completed?: No.    Education has been provided regarding the importance of this vaccine. Patient has been advised to call insurance company to determine out of pocket expense if they have not yet received this vaccine. Advised may also receive vaccine at local pharmacy or Health Dept. Verbalized acceptance and understanding.  Screening Tests Health Maintenance  Topic Date Due   Medicare Annual Wellness (AWV)  Never done   COVID-19 Vaccine (1) Never done   TETANUS/TDAP  Never done   Lung Cancer Screening  Never done   Zoster Vaccines- Shingrix (1 of 2) 06/02/2022 (Originally 12/14/2000)   INFLUENZA VACCINE  09/21/2022 (Originally 01/21/2022)   COLONOSCOPY (Pts 45-59yr Insurance coverage will need to be confirmed)  08/31/2031   Pneumonia Vaccine 71 Years old  Completed   Hepatitis C Screening  Completed   HPV VACCINES  Aged Out    Health Maintenance  Health Maintenance Due  Topic Date Due   Medicare Annual Wellness (AWV)  Never done   COVID-19 Vaccine (1) Never done   TETANUS/TDAP  Never done   Lung Cancer Screening  Never done    Colorectal cancer screening: Type of screening: Colonoscopy. Completed 08/30/2021. Repeat every 6 months, procedure is scheduled for 05/02/2022. years  Lung Cancer Screening: (Low Dose CT Chest recommended if Age 813-80years, 30 pack-year currently smoking OR have quit w/in 15years.) does qualify.   Lung Cancer Screening Referral: scheduled  07/10/2022  Additional Screening:  Hepatitis C Screening: does not qualify  Vision Screening: Recommended annual ophthalmology exams for early detection of glaucoma and other disorders of the eye. Is the patient up to date with their annual eye exam?  Yes  Who is the provider or what is the name of the office in which the patient attends annual eye exams[place] [facility] [place]  If pt is not established with a provider, would they like to be referred to a provider to establish care? No .   Dental Screening: Recommended annual dental exams for proper oral hygiene  Community Resource Referral / Chronic Care Management: CRR required this visit?  No   CCM required this visit?  No      Plan:     I have personally reviewed and noted the following in the patient's chart:   Medical and social history Use of alcohol, tobacco or illicit drugs  Current medications and supplements including opioid prescriptions. Patient is not currently taking opioid prescriptions. Functional ability and status Nutritional status Physical activity Advanced directives List of other physicians Hospitalizations, surgeries, and ER visits in previous 12 months Vitals Screenings to include cognitive, depression, and falls Referrals and appointments  In addition, I have reviewed and discussed with patient certain preventive protocols, quality metrics, and best practice recommendations. A written personalized care plan for preventive services as well as general preventive health recommendations were provided to patient.     Felicity Coyer, LPN    [id number]/99/[id number]

## 2022-04-15 NOTE — Patient Instructions (Signed)
[name] [name] , Thank you for taking time to come for your Medicare Wellness Visit. I appreciate your ongoing commitment to your health goals. Please review the following plan we discussed and let me know if I can assist you in the future.   These are the goals we discussed:  Goals       DIET - INCREASE WATER INTAKE      Try to drink 6-8 glasses of water daily      Exercise 150 min/wk Moderate Activity      Walking and lifting weights 3-5 times per week.       HLD PHARMD GOAL (pt-stated)      Current Barriers:  Unable to independently afford treatment regimen Unable to maintain control of HLD  Pharmacist Clinical Goal(s):  patient will verbalize ability to afford treatment regimen maintain control of HLD as evidenced by [place]  through collaboration with PharmD and provider.   Interventions: 1:1 collaboration with Dettinger, Fransisca Kaufmann, MD regarding development and update of comprehensive plan of care as evidenced by provider attestation and co-signature Inter-disciplinary care team collaboration (see longitudinal plan of care) Comprehensive medication review performed; medication list updated in electronic medical record  Hyperlipidemia:  Goal on Track (progressing): YES. Uncontrolled; current treatment:NEZLIZET;  Copay >$100/month --patient enrolled in healthwell grant; covers copay at $0/month (will reapply next year) Patient tolerating therapy, denies side effects Was only taking every 2-3 days due to cost Will recheck lipids at next pcp f/u Medications previously tried: atorvastatin, rosuvastatin  hx of CAD with occluded RCA treated medically, hypertension and history of tobacco use (stable on spiriva-COPD) Current dietary patterns: heart healthy diet Mediterranean diet, DASH diet, low-carbohydrate diet and avoidance of trans fats  Assessed patient finances. Enrolled in [place]  Lipid Panel     Component Value Date/Time   CHOL  173 09/03/2020 0950   TRIG 103 09/03/2020 0950   HDL 50 09/03/2020 0950   CHOLHDL 3.5 09/03/2020 0950   CHOLHDL 5.5 03/08/2013 0509   VLDL 22 03/08/2013 0509   LDLCALC 104 (H) 09/03/2020 0950   LABVLDL 19 09/03/2020 0950  Care Activities patient will:  - take medications as prescribed as evidenced by patient report and record review collaborate with provider on medication access solutions engage in dietary modifications by HEART HEALTHY DIET       Patient Stated      04/15/2022 AWV Goal: Fall Prevention  Over the next year, patient will decrease their risk for falls by: Using assistive devices, such as a cane or walker, as needed Identifying fall risks within their home and correcting them by: Removing throw rugs Adding handrails to stairs or ramps Removing clutter and keeping a clear pathway throughout the home Increasing light, especially at night Adding shower handles/bars Raising toilet seat Identifying potential personal risk factors for falls: Medication side effects Incontinence/urgency Vestibular dysfunction Hearing loss Musculoskeletal disorders Neurological disorders Orthostatic hypotension         Quit Smoking        This is a list of the screening recommended for you and due dates:  Health Maintenance  Topic Date Due   Medicare Annual Wellness Visit  Never done   COVID-19 Vaccine (1) Never done   Tetanus Vaccine  Never done   Screening for Lung Cancer  Never done   Zoster (Shingles) Vaccine (1 of 2) 06/02/2022*   Flu Shot  09/21/2022*   Colon Cancer Screening  08/31/2031   Pneumonia Vaccine  Completed  Hepatitis C Screening: USPSTF Recommendation to screen - Ages 46-79 yo.  Completed   HPV Vaccine  Aged Out  *Topic was postponed. The date shown is not the original due date.

## 2022-04-24 ENCOUNTER — Encounter: Payer: Self-pay | Admitting: Gastroenterology

## 2022-05-02 ENCOUNTER — Encounter: Payer: Self-pay | Admitting: Gastroenterology

## 2022-05-02 ENCOUNTER — Ambulatory Visit (AMBULATORY_SURGERY_CENTER): Payer: PPO | Admitting: Gastroenterology

## 2022-05-02 VITALS — BP 121/64 | HR 62 | Temp 97.9°F | Resp 15 | Ht 70.0 in | Wt 146.0 lb

## 2022-05-02 DIAGNOSIS — K6389 Other specified diseases of intestine: Secondary | ICD-10-CM

## 2022-05-02 DIAGNOSIS — D122 Benign neoplasm of ascending colon: Secondary | ICD-10-CM

## 2022-05-02 DIAGNOSIS — Z09 Encounter for follow-up examination after completed treatment for conditions other than malignant neoplasm: Secondary | ICD-10-CM

## 2022-05-02 DIAGNOSIS — I2510 Atherosclerotic heart disease of native coronary artery without angina pectoris: Secondary | ICD-10-CM | POA: Diagnosis not present

## 2022-05-02 DIAGNOSIS — D120 Benign neoplasm of cecum: Secondary | ICD-10-CM | POA: Diagnosis not present

## 2022-05-02 DIAGNOSIS — Z86010 Personal history of colonic polyps: Secondary | ICD-10-CM

## 2022-05-02 DIAGNOSIS — Z800 Family history of malignant neoplasm of digestive organs: Secondary | ICD-10-CM

## 2022-05-02 DIAGNOSIS — J449 Chronic obstructive pulmonary disease, unspecified: Secondary | ICD-10-CM | POA: Diagnosis not present

## 2022-05-02 MED ORDER — SODIUM CHLORIDE 0.9 % IV SOLN: 500.0000 mL | INTRAVENOUS | Status: DC

## 2022-05-02 NOTE — Progress Notes (Signed)
Vital signs checked by:AS  The patient states no changes in medical or surgical history since pre-visit screening on 04/04/2022

## 2022-05-02 NOTE — Op Note (Signed)
[facility] Patient Name: [name] [name] Procedure Date: 05/02/2022 9:36 AM MRN: [id number] Endoscopist: Remo Lipps P. Havery Moros , MD, [id number] Age: 71 Referring MD:  Date of Birth: [birth year] Gender: Male Account #: [id number] Procedure:                Colonoscopy Indications:              High risk colon cancer surveillance: Personal                            history of colonic polyps - numerous polyps in the                            past including a large polyp at the IC valve with                            recurrence. Close surveillance exam to look at the                            IC valve polyp site, last exam March 2023 Medicines:                Monitored Anesthesia Care Procedure:                Pre-Anesthesia Assessment:                           - Prior to the procedure, a History and Physical                            was performed, and patient medications and                            allergies were reviewed. The patient's tolerance of                            previous anesthesia was also reviewed. The risks                            and benefits of the procedure and the sedation                            options and risks were discussed with the patient.                            All questions were answered, and informed consent                            was obtained. Prior Anticoagulants: The patient has                            taken no anticoagulant or antiplatelet agents. ASA                            Grade Assessment: III - A patient with severe  systemic disease. After reviewing the risks and                            benefits, the patient was deemed in satisfactory                            condition to undergo the procedure.                           After obtaining informed consent, the colonoscope                            was passed under direct vision. Throughout the                            procedure,  the patient's blood pressure, pulse, and                            oxygen saturations were monitored continuously. The                            CF [id number] #[id number] was introduced through the anus                            and advanced to the the terminal ileum, with                            identification of the appendiceal orifice and IC                            valve. The colonoscopy was performed without                            difficulty. The patient tolerated the procedure                            well. The quality of the bowel preparation was                            good. The terminal ileum, ileocecal valve,                            appendiceal orifice, and rectum were photographed. Scope In: 9:50:40 AM Scope Out: 10:11:36 AM Scope Withdrawal Time: 0 hours 16 minutes 36 seconds  Total Procedure Duration: 0 hours 20 minutes 56 seconds  Findings:                 The perianal and digital rectal examinations were                            normal.                           The terminal ileum appeared normal.  A post polypectomy scar was found at the ileocecal                            valve. There was no evidence of the previous polyp.                            Biopsies were taken with a cold forceps for                            histology to ensure no residual polypoid tissue                            (somewhat difficult area to visualize in portion                            entering valve).                           A 4 mm polyp was found in the ascending colon. The                            polyp was sessile. The polyp was removed with a                            cold snare. Resection and retrieval were complete.                           Multiple medium-mouthed diverticula were found in                            the sigmoid colon.                           Internal hemorrhoids were found during retroflexion.                            The exam was otherwise without abnormality. Complications:            No immediate complications. Estimated blood loss:                            Minimal. Estimated Blood Loss:     Estimated blood loss was minimal. Impression:               - The examined portion of the ileum was normal.                           - Post-polypectomy scar at the ileocecal valve. No                            overt evidence of residual polypoid tissue.                            Biopsied.                           -  One 4 mm polyp in the ascending colon, removed                            with a cold snare. Resected and retrieved.                           - Diverticulosis in the sigmoid colon.                           - Internal hemorrhoids.                           - The examination was otherwise normal. Recommendation:           - Patient has a contact number available for                            emergencies. The signs and symptoms of potential                            delayed complications were discussed with the                            patient. Return to normal activities tomorrow.                            Written discharge instructions were provided to the                            patient.                           - Resume previous diet.                           - Continue present medications.                           - Await pathology results. Anticipate repeat                            colonoscopy in 3 years pending no concerning                            pathology at polypectomy site biopsy Remo Lipps P. Cleotha Whalin, MD 05/02/2022 10:18:39 AM This report has been signed electronically.

## 2022-05-02 NOTE — Progress Notes (Signed)
[facility] History and Physical   Primary Care Physician:  Dettinger, Fransisca Kaufmann, MD   Reason for Procedure:   History of colon polyps  Plan:    colonoscopy     HPI: [name] [name] is a 71 y.o. male  here for colonoscopy surveillance - numerous polyps removed over the past 2 exams, large difficult to remove polyp at the IC valve. Last exam March 2023, recurrent polyp at IC valve. Here for close surveillance colonoscopy.  . Patient denies any bowel symptoms at this time. No family history of colon cancer known. Otherwise feels well without any cardiopulmonary symptoms.   I have discussed risks / benefits of anesthesia and endoscopic procedure with [name] [name] and they wish to proceed with the exams as outlined today.    Past Medical History:  Diagnosis Date   Arthritis    Carotid artery stenosis without cerebral infarction    vascular --- dr Scot Dock;   09-24-2007  s/p right CEA   Chronic cholecystitis    w/  large calcified stone in gallbladder   Chronic constipation    COPD (chronic obstructive pulmonary disease) ([place])    followed by pcp----  (05-28-2021  pt stated last used rescue 05-27-2021 for wheezing resolved after use, stated exacerbation approx  greater than a year ago)   Coronary artery disease 2002   cardiologist-- dr Percival Spanish ;  (05-28-2021  pt denies need to use nitro for CP) per last cath 08-04-2017  LM luminal irregularities, LAD 30% ,D1 ostial 35% ,CFx   AV groove ostial 25% , OM1 long prox 30%,  proxRCA occluded and dominant,   excellent left to right collateral filling.   Depression    Patient denies   DOE (dyspnea on exertion)    due to smoking/ copd   Dyslipidemia    Essential hypertension    Full dentures    GERD (gastroesophageal reflux disease)    History of adenomatous polyp of colon    History of COVID-19    per pt winter 2022, mild to moderate symptoms that resolved   History of diverticulitis of colon    History of kidney  stones    Hyperlipidemia    Hypothyroidism    followed by pcp   Lung nodule    s/p bronchoscopy w/ bx in 2005,  benign   Osteoporosis    Smokers' cough ([place])    05-28-2021  pt stated occasionally productive w/ white phlegm   Wears glasses     Past Surgical History:  Procedure Laterality Date   BRONCHOSCOPY  2005   CARDIAC CATHETERIZATION  08/11/2000   '@[facility]'$  by dr Lia Foyer;   preserved LVF,  ostial RCA 70-75%, other nonobstructive disease   CARDIAC CATHETERIZATION  03/30/2008   '@[facility]'$  by dr hochrein;  occluded RCA and other nonobstructive involving LAD, CFx, D1,OM1  and left to right collateral filling,  mild inferior hypokinesis,  ef 55%   CAROTID ENDARTERECTOMY Right 09/24/2007   '@[facility]'$  by dr Scot Dock   COLONOSCOPY  01/19/2017   by armsbruster   EXCISION NEUROMA Left 10/01/2005   '@[facility]'$ ;  left inguinal   EXTRACORPOREAL SHOCK WAVE LITHOTRIPSY  2015   INGUINAL HERNIA REPAIR Left    01-13-2003 and 07-22-2006 both '@[facility]    LAPAROSCOPIC CHOLECYSTECTOMY SINGLE SITE WITH INTRAOPERATIVE CHOLANGIOGRAM N/A 05/30/2021   Procedure: LAPAROSCOPIC CHOLECYSTECTOMY SINGLE SITE WITH INTRAOPERATIVE CHOLANGIOGRAM;  Surgeon: Michael Boston, MD;  Location: [facility];  Service: General;  Laterality: N/A;   LAPAROSCOPIC INGUINAL HERNIA REPAIR Bilateral 04/28/2007   @  [facility]   LEFT HEART CATH AND CORONARY ANGIOGRAPHY N/A 08/04/2017   Procedure: LEFT HEART CATH AND CORONARY ANGIOGRAPHY;  Surgeon: Burnell Blanks, MD;  Location: [facility];  Service: Cardiovascular;  Laterality: N/A;   THROAT SURGERY  1997   pt stated removal of tumor that was benign    Prior to Admission medications   Medication Sig Start Date End Date Taking? Authorizing Provider  albuterol (VENTOLIN HFA) 108 (90 Base) MCG/ACT inhaler INHALE 2 PUFFS EVERY 4 HOURS AS NEEDED FOR WHEEZING OR SHORTNESS OF BREATH Patient taking differently: 2 puffs every 4 (four) hours as needed. 04/03/21  Yes Dettinger, Fransisca Kaufmann, MD   aspirin EC 81 MG tablet Take 1 tablet (81 mg total) by mouth daily. Swallow whole. 07/03/21  Yes Caron Presume K, PA-C  Bempedoic Acid-Ezetimibe (NEXLIZET) 180-10 MG TABS Take 1 tablet by mouth daily. 02/06/22  Yes Dettinger, Fransisca Kaufmann, MD  Budeson-Glycopyrrol-Formoterol (BREZTRI AEROSPHERE) 160-9-4.8 MCG/ACT AERO Inhale 2 puffs into the lungs 2 (two) times daily. 02/27/22  Yes Dettinger, Fransisca Kaufmann, MD  levothyroxine (SYNTHROID) 50 MCG tablet Take 1 tablet (50 mcg total) by mouth daily. 02/06/22  Yes Dettinger, Fransisca Kaufmann, MD  lisinopril (ZESTRIL) 20 MG tablet Take 1 tablet (20 mg total) by mouth daily. 02/06/22  Yes Dettinger, Fransisca Kaufmann, MD  nitroGLYCERIN (NITROSTAT) 0.4 MG SL tablet Place 1 tablet (0.4 mg total) under the tongue every 5 (five) minutes as needed for chest pain. 09/03/20 04/15/22  Dettinger, Fransisca Kaufmann, MD    Current Outpatient Medications  Medication Sig Dispense Refill   albuterol (VENTOLIN HFA) 108 (90 Base) MCG/ACT inhaler INHALE 2 PUFFS EVERY 4 HOURS AS NEEDED FOR WHEEZING OR SHORTNESS OF BREATH (Patient taking differently: 2 puffs every 4 (four) hours as needed.) 18 g 5   aspirin EC 81 MG tablet Take 1 tablet (81 mg total) by mouth daily. Swallow whole. 90 tablet 3   Bempedoic Acid-Ezetimibe (NEXLIZET) 180-10 MG TABS Take 1 tablet by mouth daily. 90 tablet 3   Budeson-Glycopyrrol-Formoterol (BREZTRI AEROSPHERE) 160-9-4.8 MCG/ACT AERO Inhale 2 puffs into the lungs 2 (two) times daily. 10.7 g 11   levothyroxine (SYNTHROID) 50 MCG tablet Take 1 tablet (50 mcg total) by mouth daily. 90 tablet 3   lisinopril (ZESTRIL) 20 MG tablet Take 1 tablet (20 mg total) by mouth daily. 90 tablet 3   nitroGLYCERIN (NITROSTAT) 0.4 MG SL tablet Place 1 tablet (0.4 mg total) under the tongue every 5 (five) minutes as needed for chest pain. 25 tablet 3   Current Facility-Administered Medications  Medication Dose Route Frequency Provider Last Rate Last Admin   0.9 %  sodium chloride infusion  500 mL  Intravenous Continuous Anitria Andon, Carlota Raspberry, MD        Allergies as of 05/02/2022 - Review Complete 05/02/2022  Allergen Reaction Noted   Crestor [rosuvastatin calcium] Other (See Comments)    Flomax [tamsulosin hcl] Other (See Comments) 01/07/2016   Lipitor [atorvastatin calcium] Other (See Comments)     Family History  Problem Relation Age of Onset   Lupus Mother    Lung disease Father    Lupus Brother    Alcohol abuse Brother    Lung disease Brother    Colon polyps Brother    Colon cancer Brother 81   Cancer Brother        colon   Lung disease Brother    Healthy Daughter    Healthy Son    Healthy Son    Healthy Son  Esophageal cancer Neg Hx    Rectal cancer Neg Hx    Stomach cancer Neg Hx    Pancreatic cancer Neg Hx    Crohn's disease Neg Hx    Ulcerative colitis Neg Hx     Social History   Socioeconomic History   Marital status: Married    Spouse name: Hunter   Number of children: 4   Years of education: high school diploma   Highest education level: 12th grade  Occupational History   Occupation: truck Education administrator: [employer]  Tobacco Use   Smoking status: Every Day    Packs/day: 1.00    Years: 58.00    Total pack years: 58.00    Types: Cigarettes   Smokeless tobacco: Never  Vaping Use   Vaping Use: Never used  Substance and Sexual Activity   Alcohol use: Yes    Alcohol/week: 2.0 standard drinks of alcohol    Types: 2 Standard drinks or equivalent per week   Drug use: No   Sexual activity: Yes    Birth control/protection: None  Other Topics Concern   Not on file  Social History Narrative   Lives with wife in a split level home.    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   Social Determinants of Health   Financial Resource Strain: Low Risk  (04/15/2022)   Overall Financial  Resource Strain (CARDIA)    Difficulty of Paying Living Expenses: Not hard at all  Food Insecurity: No Food Insecurity (04/15/2022)   Hunger Vital Sign    Worried About Running Out of Food in the Last Year: Never true    Ran Out of Food in the Last Year: Never true  Transportation Needs: No Transportation Needs (04/15/2022)   PRAPARE - Hydrologist (Medical): No    Lack of Transportation (Non-Medical): No  Physical Activity: Sufficiently Active (04/15/2022)   Exercise Vital Sign    Days of Exercise per Week: 7 days    Minutes of Exercise per Session: 90 min  Stress: No Stress Concern Present (04/15/2022)   [place]    Feeling of Stress : Not at all  Social Connections: [place] (04/15/2022)   Social Connection and Isolation Panel [NHANES]    Frequency of Communication with Friends and Family: Three times a week    Frequency of Social Gatherings with Friends and Family: Three times a week    Attends Religious Services: More than 4 times per year    Active Member of Clubs or Organizations: Yes    Attends Archivist Meetings: More than 4 times per year    Marital Status: Married  Human resources officer Violence: Not At Risk (04/15/2022)   Humiliation, Afraid, Rape, and Kick questionnaire    Fear of Current or Ex-Partner: No    Emotionally Abused: No    Physically Abused: No    Sexually Abused: No    Review of Systems: All other review of systems negative except as mentioned in the HPI.  Physical Exam: Vital signs BP (!) 159/85   Pulse 72   Temp 97.9 F (36.6 C)   Ht '5\' 10"'$  (1.778 m)   Wt 146 lb (66.2 kg)   SpO2 97%   BMI 20.95  kg/m   General:   Alert,  Well-developed, pleasant and cooperative in NAD Lungs:  Clear throughout to auscultation.   Heart:  Regular rate and rhythm Abdomen:  Soft, nontender and nondistended.   Neuro/Psych:  Alert and cooperative.  Normal mood and affect. A and O x 3  Jolly Mango, MD [facility]

## 2022-05-02 NOTE — Progress Notes (Signed)
Called to room to assist during endoscopic procedure.  Patient ID and intended procedure confirmed with present staff. Received instructions for my participation in the procedure from the performing physician.  

## 2022-05-02 NOTE — Progress Notes (Signed)
Report to PACU, RN, vss, BBS= Clear.  

## 2022-05-02 NOTE — Patient Instructions (Signed)
Impression/Recommendations:  Polyp, diverticulosis, and hemorrhoid handouts given to patient.  Resume previous diet. Continue present medications. Await pathology results.  Likely repeat colonoscopy in 3 years, based on pathology results of biopsy.  YOU HAD AN ENDOSCOPIC PROCEDURE TODAY AT [facility]:   Refer to the procedure report that was given to you for any specific questions about what was found during the examination.  If the procedure report does not answer your questions, please call your gastroenterologist to clarify.  If you requested that your care partner not be given the details of your procedure findings, then the procedure report has been included in a sealed envelope for you to review at your convenience later.  YOU SHOULD EXPECT: Some feelings of bloating in the abdomen. Passage of more gas than usual.  Walking can help get rid of the air that was put into your GI tract during the procedure and reduce the bloating. If you had a lower endoscopy (such as a colonoscopy or flexible sigmoidoscopy) you may notice spotting of blood in your stool or on the toilet paper. If you underwent a bowel prep for your procedure, you may not have a normal bowel movement for a few days.  Please Note:  You might notice some irritation and congestion in your nose or some drainage.  This is from the oxygen used during your procedure.  There is no need for concern and it should clear up in a day or so.  SYMPTOMS TO REPORT IMMEDIATELY:  Following lower endoscopy (colonoscopy or flexible sigmoidoscopy):  Excessive amounts of blood in the stool  Significant tenderness or worsening of abdominal pains  Swelling of the abdomen that is new, acute  Fever of 100F or higher  For urgent or emergent issues, a gastroenterologist can be reached at any hour by calling [phone]. Do not use MyChart messaging for urgent concerns.    DIET:  We do recommend a small meal at first, but then  you may proceed to your regular diet.  Drink plenty of fluids but you should avoid alcoholic beverages for 24 hours.  ACTIVITY:  You should plan to take it easy for the rest of today and you should NOT DRIVE or use heavy machinery until tomorrow (because of the sedation medicines used during the test).    FOLLOW UP: Our staff will call the number listed on your records the next business day following your procedure.  We will call around 7:15- 8:00 am to check on you and address any questions or concerns that you may have regarding the information given to you following your procedure. If we do not reach you, we will leave a message.     If any biopsies were taken you will be contacted by phone or by letter within the next 1-3 weeks.  Please call us at [phone] if you have not heard about the biopsies in 3 weeks.    SIGNATURES/CONFIDENTIALITY: You and/or your care partner have signed paperwork which will be entered into your electronic medical record.  These signatures attest to the fact that that the information above on your After Visit Summary has been reviewed and is understood.  Full responsibility of the confidentiality of this discharge information lies with you and/or your care-partner.

## 2022-05-05 ENCOUNTER — Telehealth: Payer: Self-pay

## 2022-05-05 NOTE — Telephone Encounter (Signed)
  Follow up Call-     05/02/2022    8:55 AM 08/30/2021    1:43 PM  Call back number  Post procedure Call Back phone  # [phone]  Permission to leave phone message Yes Yes     Patient questions:  Do you have a fever, pain , or abdominal swelling? No. Pain Score  0 *  Have you tolerated food without any problems? Yes.    Have you been able to return to your normal activities? Yes.    Do you have any questions about your discharge instructions: Diet   No. Medications  No. Follow up visit  No.  Do you have questions or concerns about your Care? No.  Actions: * If pain score is 4 or above: No action needed, pain <4.

## 2022-05-26 ENCOUNTER — Other Ambulatory Visit: Payer: Self-pay | Admitting: Family Medicine

## 2022-05-27 ENCOUNTER — Telehealth: Payer: PPO

## 2022-06-13 ENCOUNTER — Encounter: Payer: Self-pay | Admitting: Nurse Practitioner

## 2022-06-13 ENCOUNTER — Ambulatory Visit (INDEPENDENT_AMBULATORY_CARE_PROVIDER_SITE_OTHER): Payer: PPO | Admitting: Nurse Practitioner

## 2022-06-13 VITALS — BP 157/62 | HR 54 | Temp 98.0°F | Resp 20 | Ht 70.0 in | Wt 146.0 lb

## 2022-06-13 DIAGNOSIS — J209 Acute bronchitis, unspecified: Secondary | ICD-10-CM

## 2022-06-13 DIAGNOSIS — J440 Chronic obstructive pulmonary disease with acute lower respiratory infection: Secondary | ICD-10-CM

## 2022-06-13 MED ORDER — METHYLPREDNISOLONE ACETATE 80 MG/ML IJ SUSP: 80.0000 mg | Freq: Once | INTRAMUSCULAR | Status: DC

## 2022-06-13 MED ORDER — PREDNISONE 20 MG PO TABS: ORAL_TABLET | ORAL | 0 refills | Status: DC

## 2022-06-13 MED ORDER — METHYLPREDNISOLONE ACETATE 40 MG/ML IJ SUSP
80.0000 mg | Freq: Once | INTRAMUSCULAR | Status: AC
  Administered 2022-06-13: 80 mg via INTRAMUSCULAR

## 2022-06-13 MED ORDER — AZITHROMYCIN 250 MG PO TABS: ORAL_TABLET | ORAL | 0 refills | Status: DC

## 2022-06-13 NOTE — Progress Notes (Signed)
Subjective:    Patient ID: [name] [name], male    DOB: 1950/08/14, 71 y.o.   MRN: [id number]   Chief Complaint: Cough and Nasal Congestion   Cough This is a new problem. Episode onset: 4 days ago. The problem has been gradually worsening. The problem occurs constantly. The cough is Productive of sputum. Associated symptoms include nasal congestion, rhinorrhea and shortness of breath. Pertinent negatives include no chills, ear pain or fever. Nothing aggravates the symptoms. He has tried OTC cough suppressant for the symptoms. The treatment provided mild relief. His past medical history is significant for COPD.       Review of Systems  Constitutional:  Negative for chills and fever.  HENT:  Positive for rhinorrhea. Negative for ear pain.   Respiratory:  Positive for cough and shortness of breath.        Objective:   Physical Exam Vitals and nursing note reviewed.  Constitutional:      Appearance: Normal appearance. He is well-developed.  HENT:     Head: Normocephalic.     Nose: Nose normal.     Mouth/Throat:     Mouth: Mucous membranes are moist.     Pharynx: Oropharynx is clear.  Eyes:     Pupils: Pupils are equal, round, and reactive to light.  Neck:     Thyroid: No thyroid mass or thyromegaly.     Vascular: No carotid bruit or JVD.     Trachea: Phonation normal.  Cardiovascular:     Rate and Rhythm: Normal rate and regular rhythm.  Pulmonary:     Effort: Pulmonary effort is normal. No respiratory distress.     Breath sounds: No wheezing.     Comments: Diminished breath sounds lower lobes bil Abdominal:     General: Bowel sounds are normal.     Palpations: Abdomen is soft.     Tenderness: There is no abdominal tenderness.  Musculoskeletal:        General: Normal range of motion.     Cervical back: Normal range of motion and neck supple.  Lymphadenopathy:     Cervical: No cervical adenopathy.  Skin:    General: Skin is warm and dry.  Neurological:      Mental Status: He is alert and oriented to person, place, and time.  Psychiatric:        Behavior: Behavior normal.        Thought Content: Thought content normal.        Judgment: Judgment normal.     BP (!) 157/62   Pulse (!) 54   Temp 98 F (36.7 C) (Temporal)   Resp 20   Ht '5\' 10"'$  (1.778 m)   Wt 146 lb (66.2 kg)   SpO2 98%   BMI 20.95 kg/m        Assessment & Plan:   [name] in today with chief complaint of Cough and Nasal Congestion   1. Acute bronchitis with COPD ([place]) 1. Take meds as prescribed 2. Use a cool mist humidifier especially during the winter months and when heat has been humid. 3. Use saline nose sprays frequently 4. Saline irrigations of the nose can be very helpful if done frequently.  * 4X daily for 1 week*  * Use of a nettie pot can be helpful with this. Follow directions with this* 5. Drink plenty of fluids 6. Keep thermostat turn down low 7.For any cough or congestion- mucinex OTC 8. For fever or aces or pains- take tylenol  or ibuprofen appropriate for age and weight.  * for fevers greater than 101 orally you may alternate ibuprofen and tylenol every  3 hours.    - methylPREDNISolone acetate (DEPO-MEDROL) injection 80 mg - predniSONE (DELTASONE) 20 MG tablet; 2 po at sametime daily for 5 days-  Dispense: 10 tablet; Refill: 0 - azithromycin (ZITHROMAX Z-PAK) 250 MG tablet; As directed  Dispense: 6 tablet; Refill: 0    The above assessment and management plan was discussed with the patient. The patient verbalized understanding of and has agreed to the management plan. Patient is aware to call the clinic if symptoms persist or worsen. Patient is aware when to return to the clinic for a follow-up visit. Patient educated on when it is appropriate to go to the emergency department.   [name]-Margaret Hassell Done, FNP  s

## 2022-06-13 NOTE — Patient Instructions (Signed)
1. Take meds as prescribed 2. Use a cool mist humidifier especially during the winter months and when heat has been humid. 3. Use saline nose sprays frequently 4. Saline irrigations of the nose can be very helpful if done frequently.  * 4X daily for 1 week*  * Use of a nettie pot can be helpful with this. Follow directions with this* 5. Drink plenty of fluids 6. Keep thermostat turn down low 7.For any cough or congestion- mucinex OTC 8. For fever or aces or pains- take tylenol or ibuprofen appropriate for age and weight.  * for fevers greater than 101 orally you may alternate ibuprofen and tylenol every  3 hours.

## 2022-06-20 ENCOUNTER — Telehealth: Payer: Self-pay | Admitting: Family Medicine

## 2022-06-20 NOTE — Telephone Encounter (Signed)
Patient was seen on 12/22 and is not feeling any better. Would like to know if there is anything else that can be called in or anything else that could be recommended. Please call back.

## 2022-06-20 NOTE — Telephone Encounter (Signed)
PT R/C 

## 2022-06-26 NOTE — Telephone Encounter (Signed)
If he is not feeling any better then he needs to be seen again, please make an appointment with someone possible.  It looks like he had a good treatment and if he is not feeling better then we need to make sure he does not have pneumonia or something like that.

## 2022-07-04 ENCOUNTER — Ambulatory Visit (HOSPITAL_COMMUNITY)
Admission: RE | Admit: 2022-07-04 | Discharge: 2022-07-04 | Disposition: A | Discharge status: Home / Self Care | Payer: PPO | Source: Ambulatory Visit | Attending: Physician Assistant | Admitting: Physician Assistant

## 2022-07-04 DIAGNOSIS — I6523 Occlusion and stenosis of bilateral carotid arteries: Secondary | ICD-10-CM | POA: Diagnosis not present

## 2022-07-09 NOTE — Telephone Encounter (Signed)
Spoke with pt. He is feeling better. Still has some congestion but not as bad as it was. Informed to take Mucinex DM, increase water intake, walk, deep breathing exercises. Pt will call back if needed.

## 2022-07-10 ENCOUNTER — Ambulatory Visit (HOSPITAL_COMMUNITY)
Admission: RE | Admit: 2022-07-10 | Discharge: 2022-07-10 | Disposition: A | Discharge status: Home / Self Care | Payer: PPO | Source: Ambulatory Visit | Attending: Family Medicine | Admitting: Family Medicine

## 2022-07-10 ENCOUNTER — Telehealth: Payer: Self-pay

## 2022-07-10 DIAGNOSIS — Z87891 Personal history of nicotine dependence: Secondary | ICD-10-CM | POA: Insufficient documentation

## 2022-07-10 DIAGNOSIS — F17210 Nicotine dependence, cigarettes, uncomplicated: Secondary | ICD-10-CM | POA: Diagnosis not present

## 2022-07-10 NOTE — Telephone Encounter (Signed)
Called patient regarding results. Patient had understanding of results. 

## 2022-07-11 ENCOUNTER — Other Ambulatory Visit: Payer: Self-pay

## 2022-07-11 DIAGNOSIS — Z122 Encounter for screening for malignant neoplasm of respiratory organs: Secondary | ICD-10-CM

## 2022-07-11 DIAGNOSIS — Z87891 Personal history of nicotine dependence: Secondary | ICD-10-CM

## 2022-07-11 DIAGNOSIS — F17210 Nicotine dependence, cigarettes, uncomplicated: Secondary | ICD-10-CM

## 2022-07-18 ENCOUNTER — Telehealth: Payer: Self-pay | Admitting: Family Medicine

## 2022-07-18 NOTE — Telephone Encounter (Signed)
Pt has been informed and understood. He has no further concerns.

## 2022-07-18 NOTE — Telephone Encounter (Signed)
CT scan from what I can tell looks good, there is no new nodules and nothing is increased in size.  They just had repeat scan in 1 year

## 2022-07-28 ENCOUNTER — Telehealth: Payer: Self-pay | Admitting: Family Medicine

## 2022-08-25 ENCOUNTER — Encounter: Payer: Self-pay | Admitting: Family Medicine

## 2022-08-25 ENCOUNTER — Ambulatory Visit (INDEPENDENT_AMBULATORY_CARE_PROVIDER_SITE_OTHER): Payer: PPO | Admitting: Family Medicine

## 2022-08-25 VITALS — BP 144/82 | HR 68 | Ht 70.0 in | Wt 147.0 lb

## 2022-08-25 DIAGNOSIS — K5904 Chronic idiopathic constipation: Secondary | ICD-10-CM | POA: Diagnosis not present

## 2022-08-25 DIAGNOSIS — J449 Chronic obstructive pulmonary disease, unspecified: Secondary | ICD-10-CM | POA: Diagnosis not present

## 2022-08-25 DIAGNOSIS — N401 Enlarged prostate with lower urinary tract symptoms: Secondary | ICD-10-CM | POA: Diagnosis not present

## 2022-08-25 DIAGNOSIS — R350 Frequency of micturition: Secondary | ICD-10-CM

## 2022-08-25 MED ORDER — FINASTERIDE 5 MG PO TABS: 5.0000 mg | ORAL_TABLET | Freq: Every day | ORAL | 5 refills | Status: DC

## 2022-08-25 NOTE — Progress Notes (Signed)
BP (!) 144/82   Pulse 68   Ht '5\' 10"'$  (1.778 m)   Wt 147 lb (66.7 kg)   SpO2 99%   BMI 21.09 kg/m    Subjective:   Patient ID: [name] [name], male    DOB: [birth year], 72 y.o.   MRN: [id number]  HPI: [name] [name] is a 72 y.o. male presenting on 08/25/2022 for Shortness of Breath (With short distances. Had to decrease Breztri because of increased urination. ) and increased urination (And also has a difficult time starting urine flow)   HPI COPD Patient is coming in for COPD recheck today.  He is currently on Breztri, back down to 1 puff twice a day instead of 2 because of urinary frequency and urinary hesitancy.Marland Kitchen  He is having a lot more shortness of breath and wheezing spells since he backed off and he says he gets short of breath even walking up the stairs to use the restroom.Marland Kitchen  He has 7 nighttime symptoms per week and 7 daytime symptoms per week currently.  He has a friend on Trelegy would like to try that.  He does continue to smoke but he does state that he is trying to back off some.  Constipation and bloated Patient says he is always constipated and bloated and has been for some time.  He said he had a colonoscopy recently and it looks like it was just in November of last year and looked good.  He also had lung cancer screening in January of this year which looked good.  Relevant past medical, surgical, family and social history reviewed and updated as indicated. Interim medical history since our last visit reviewed. Allergies and medications reviewed and updated.  Review of Systems  Constitutional:  Negative for chills and fever.  Eyes:  Negative for discharge and visual disturbance.  Respiratory:  Positive for cough, shortness of breath and wheezing.   Cardiovascular:  Negative for chest pain and leg swelling.  Musculoskeletal:  Negative for back pain and gait problem.  Skin:  Negative for rash.  All other systems reviewed and are negative.   Per HPI unless  specifically indicated above   Allergies as of 08/25/2022       Reactions   Crestor [rosuvastatin Calcium] Other (See Comments)   Lethargy and muscle aches   Flomax [tamsulosin Hcl] Other (See Comments)   Made patient hypotensive, clammy, sweaty, and feel like he was going to pass out (EMS HAD TO BE CALLED)   Lipitor [atorvastatin Calcium] Other (See Comments)   Body cramps, lethargy and muscle aches        Medication List        Accurate as of August 25, 2022 11:01 AM. If you have any questions, ask your nurse or doctor.          STOP taking these medications    azithromycin 250 MG tablet Commonly known as: Zithromax Z-Pak Stopped by: Fransisca Kaufmann Dominga Mcduffie, MD   predniSONE 20 MG tablet Commonly known as: DELTASONE Stopped by: Fransisca Kaufmann Brand Siever, MD       TAKE these medications    albuterol 108 (90 Base) MCG/ACT inhaler Commonly known as: VENTOLIN HFA INHALE 2 PUFFS EVERY 4 HOURS AS NEEDED FOR WHEEZING OR SHORTNESS OF BREATH   aspirin EC 81 MG tablet Take 1 tablet (81 mg total) by mouth daily. Swallow whole.   Breztri Aerosphere 160-9-4.8 MCG/ACT Aero Generic drug: Budeson-Glycopyrrol-Formoterol Inhale 2 puffs into the lungs 2 (two) times daily.  finasteride 5 MG tablet Commonly known as: Proscar Take 1 tablet (5 mg total) by mouth daily. Started by: Worthy Rancher, MD   levothyroxine 50 MCG tablet Commonly known as: SYNTHROID Take 1 tablet (50 mcg total) by mouth daily.   lisinopril 20 MG tablet Commonly known as: ZESTRIL Take 1 tablet (20 mg total) by mouth daily.   Nexlizet 180-10 MG Tabs Generic drug: Bempedoic Acid-Ezetimibe Take 1 tablet by mouth daily.   nitroGLYCERIN 0.4 MG SL tablet Commonly known as: NITROSTAT Place 1 tablet (0.4 mg total) under the tongue every 5 (five) minutes as needed for chest pain.         Objective:   BP (!) 144/82   Pulse 68   Ht '5\' 10"'$  (1.778 m)   Wt 147 lb (66.7 kg)   SpO2 99%   BMI 21.09 kg/m    Wt Readings from Last 3 Encounters:  08/25/22 147 lb (66.7 kg)  06/13/22 146 lb (66.2 kg)  05/02/22 146 lb (66.2 kg)    Physical Exam Vitals and nursing note reviewed.  Constitutional:      General: He is not in acute distress.    Appearance: He is well-developed. He is not diaphoretic.  Eyes:     General: No scleral icterus.    Conjunctiva/sclera: Conjunctivae normal.  Neck:     Thyroid: No thyromegaly.  Cardiovascular:     Rate and Rhythm: Normal rate and regular rhythm.     Heart sounds: Normal heart sounds. No murmur heard. Pulmonary:     Effort: Pulmonary effort is normal. No respiratory distress.     Breath sounds: Wheezing and rhonchi present. No rales.  Chest:     Chest wall: No tenderness.  Musculoskeletal:        General: Normal range of motion.     Cervical back: Neck supple.  Lymphadenopathy:     Cervical: No cervical adenopathy.  Skin:    General: Skin is warm and dry.     Findings: No rash.  Neurological:     Mental Status: He is alert and oriented to person, place, and time.     Coordination: Coordination normal.  Psychiatric:        Behavior: Behavior normal.       Assessment & Plan:   Problem List Items Addressed This Visit       Respiratory   COPD (chronic obstructive pulmonary disease) ([facility]) - Primary     Digestive   Chronic idiopathic constipation   Other Visit Diagnoses     Benign prostatic hyperplasia with urinary frequency       Relevant Medications   finasteride (PROSCAR) 5 MG tablet       Gave samples for Trelegy that he can try and if it works well then he can let us know and we can give him prescription for it.  He is currently taking Breztri but had to back down on the dose because of urinary frequency.  If no better from the Trelegy and he cannot take the full dose of the Breztri we may have to send him to pulmonology for further evaluation  Recommended to do a laxative for 2 weeks every day to clean out his bowels and then  let us know if he is not better after that. Follow up plan: Return if symptoms worsen or fail to improve.  Counseling provided for all of the vaccine components No orders of the defined types were placed in this encounter.   Caryl Pina, MD  [place] 08/25/2022, 11:01 AM

## 2022-10-06 ENCOUNTER — Telehealth: Payer: Self-pay | Admitting: Family Medicine

## 2022-10-06 DIAGNOSIS — J449 Chronic obstructive pulmonary disease, unspecified: Secondary | ICD-10-CM

## 2022-10-06 MED ORDER — TRELEGY ELLIPTA 100-62.5-25 MCG/ACT IN AEPB
1.0000 | INHALATION_SPRAY | Freq: Every day | RESPIRATORY_TRACT | 11 refills | Status: DC
Start: 2022-10-06 — End: 2023-04-03

## 2022-10-06 NOTE — Telephone Encounter (Signed)
Sent Trelegy prescription for him, the [name] [name] was not tolerated well by him.

## 2022-10-06 NOTE — Telephone Encounter (Signed)
Pt aware of provider feedback and voiced understanding. 

## 2022-10-28 DIAGNOSIS — I6523 Occlusion and stenosis of bilateral carotid arteries: Secondary | ICD-10-CM | POA: Insufficient documentation

## 2022-10-28 NOTE — Progress Notes (Unsigned)
  Cardiology Office Note:   Date:  10/29/2022  ID:  [name] [name], DOB 1951/04/30, MRN [id number]  History of Present Illness:   [name] [name] is a 72 y.o. male with a hx of CAD with occluded RCA treated medically, hypertension and history of tobacco use.  He had a cardiac catheterization last in 2019.  He has had no new symptoms since I last saw him.  The patient denies any new symptoms such as chest discomfort, neck or arm discomfort. There has been no new shortness of breath, PND or orthopnea. There have been no reported palpitations, presyncope or syncope.  He does his yard work and was out there immediately today.    ROS: As stated in the HPI and negative for all other systems.  Studies Reviewed:    EKG: Sinus rhythm.  Rate 73, left axis deviation, left ventricular hypertrophy by voltage criteria, borderline interventricular conduction delay, premature ventricular contraction   Risk Assessment/Calculations:      Physical Exam:   VS:  BP (!) 178/80   Pulse 73   Ht 5\' 10"  (1.778 m)   Wt 148 lb (67.1 kg)   BMI 21.24 kg/m    Wt Readings from Last 3 Encounters:  10/29/22 148 lb (67.1 kg)  08/25/22 147 lb (66.7 kg)  06/13/22 146 lb (66.2 kg)     GEN: Well nourished, well developed in no acute distress NECK: No JVD; No carotid bruits CARDIAC: RRR, no murmurs, rubs, gallops RESPIRATORY:  Clear to auscultation without rales, wheezing or rhonchi  ABDOMEN: Soft, non-tender, non-distended EXTREMITIES:  No edema; No deformity   ASSESSMENT AND PLAN:   CAD:  The patient has no new sypmtoms.  No further cardiovascular testing is indicated.  We will continue with aggressive risk reduction and meds as listed.  Hypertension:   The blood pressure is not controlled.  I am going to add amlodipine 5 mg daily.  He does not have an allergy to this and does not recall why this was stopped in 2020.   Hyperlipidemia: I am going to get him to see our [facility] to start a PCSK9  inhibitor.   Tobacco use: He has not think he can quit smoking although he is cutting down.  History of carotid stenosis:  He had right CEA.  He has had follow-up Dopplers.    Signed, Rollene Rotunda, MD

## 2022-10-29 ENCOUNTER — Encounter: Payer: Self-pay | Admitting: Cardiology

## 2022-10-29 ENCOUNTER — Ambulatory Visit (INDEPENDENT_AMBULATORY_CARE_PROVIDER_SITE_OTHER): Payer: PPO | Admitting: Cardiology

## 2022-10-29 VITALS — BP 178/80 | HR 73 | Ht 70.0 in | Wt 148.0 lb

## 2022-10-29 DIAGNOSIS — I25119 Atherosclerotic heart disease of native coronary artery with unspecified angina pectoris: Secondary | ICD-10-CM

## 2022-10-29 DIAGNOSIS — Z720 Tobacco use: Secondary | ICD-10-CM

## 2022-10-29 DIAGNOSIS — I6523 Occlusion and stenosis of bilateral carotid arteries: Secondary | ICD-10-CM

## 2022-10-29 DIAGNOSIS — E785 Hyperlipidemia, unspecified: Secondary | ICD-10-CM | POA: Diagnosis not present

## 2022-10-29 DIAGNOSIS — I10 Essential (primary) hypertension: Secondary | ICD-10-CM | POA: Diagnosis not present

## 2022-10-29 MED ORDER — AMLODIPINE BESYLATE 5 MG PO TABS: 5.0000 mg | ORAL_TABLET | Freq: Every day | ORAL | 3 refills | Status: DC

## 2022-10-29 NOTE — Patient Instructions (Signed)
Medication Instructions:  Please start Norvasc 5 mg once daily. Continue all other medications as listed.  *If you need a refill on your cardiac medications before your next appointment, please call your pharmacy*  Please keep a blood work diary for 2 weeks.  You may drop it off  at the [place] office for review.  You have been referred to the Lipid Clinic and will be contacted to be scheduled.   Follow-Up: At [facility], you and your health needs are our priority.  As part of our continuing mission to provide you with exceptional heart care, we have created designated Provider Care Teams.  These Care Teams include your primary Cardiologist (physician) and Advanced Practice Providers (APPs -  Physician Assistants and Nurse Practitioners) who all work together to provide you with the care you need, when you need it.  We recommend signing up for the patient portal called "MyChart".  Sign up information is provided on this After Visit Summary.  MyChart is used to connect with patients for Virtual Visits (Telemedicine).  Patients are able to view lab/test results, encounter notes, upcoming appointments, etc.  Non-urgent messages can be sent to your provider as well.   To learn more about what you can do with MyChart, go to [email or web address].    Your next appointment:   1 year(s)  Provider:   Rollene Rotunda, MD

## 2022-10-31 ENCOUNTER — Ambulatory Visit: Payer: PPO | Attending: Cardiovascular Disease | Admitting: Pharmacist

## 2022-10-31 ENCOUNTER — Encounter: Payer: Self-pay | Admitting: Pharmacist

## 2022-10-31 DIAGNOSIS — I25119 Atherosclerotic heart disease of native coronary artery with unspecified angina pectoris: Secondary | ICD-10-CM

## 2022-10-31 DIAGNOSIS — T466X5A Adverse effect of antihyperlipidemic and antiarteriosclerotic drugs, initial encounter: Secondary | ICD-10-CM

## 2022-10-31 DIAGNOSIS — I2510 Atherosclerotic heart disease of native coronary artery without angina pectoris: Secondary | ICD-10-CM | POA: Diagnosis not present

## 2022-10-31 DIAGNOSIS — E782 Mixed hyperlipidemia: Secondary | ICD-10-CM

## 2022-10-31 DIAGNOSIS — M791 Myalgia, unspecified site: Secondary | ICD-10-CM | POA: Diagnosis not present

## 2022-10-31 NOTE — Patient Instructions (Addendum)
It was nice meeting you today  We would like your LDL (bad cholesterol) to be less than 70  Please continue your Nexlizet  We will update your lipid panel next week at Dr Dettinger's office. We would prefer you be fasting  Once we receive your results I will give you a call and we can discuss if you would like to start the Repatha  I also recommend trying docusate for the constipation. The name brand is Colace  Please message or call with any questions  Laural Golden, PharmD, BCACP, CDCES, CPP [address] [place], [place], [address] Phone: [phone], Fax: [phone]

## 2022-10-31 NOTE — Progress Notes (Signed)
Patient ID: [name]                 DOB: [birth year]                    MRN: [id number]     HPI: [name] [name] is a 72 y.o. male patient referred to lipid clinic by Dr [name] [name]. PMH is significant for CAD, HTN, carotid artery stenosis, COPD, and current every day smoking. Patient is statin intolerant.  Patient presents today to discuss Repatha. Last lipid panel was from August 2023 when he was started on Nexlizet. Would like lipid panel updated again before starting new medication.  Continues to smoke daily.  Has tried Crestor and Lipitor in the past and both caused muscle aches. No adverse effects to Nexlizet reported.  Restarted on amlodipine earlier this week and his wife reports the reason he d/c in the past was because it "knocked him out."  Took a dose this morning and feels fine.  Current Medications: Nexlizet 180/10   Intolerances:  Rosuvastatin Atorvastatin  Risk Factors:  CAD Smoking  LDL goal: <70  Labs: TC 169, Trigs 171, HDL 49, LDL 91 (02/06/22)  Past Medical History:  Diagnosis Date   Arthritis    Carotid artery stenosis without cerebral infarction    vascular --- dr [name] [name];   09-24-2007  s/p right CEA   Chronic cholecystitis    w/  large calcified stone in gallbladder   Chronic constipation    COPD (chronic obstructive pulmonary disease) ([facility])    followed by pcp----  (05-28-2021  pt stated last used rescue 05-27-2021 for wheezing resolved after use, stated exacerbation approx  greater than a year ago)   Coronary artery disease 2002   cardiologist-- dr [name] [name] ;  (05-28-2021  pt denies need to use nitro for CP) per last cath 08-04-2017  LM luminal irregularities, LAD 30% ,D1 ostial 35% ,CFx   AV groove ostial 25% , OM1 long prox 30%,  proxRCA occluded and dominant,   excellent left to right collateral filling.   Depression    Patient denies   DOE (dyspnea on exertion)    due to smoking/ copd   Dyslipidemia    Essential hypertension    Full  dentures    GERD (gastroesophageal reflux disease)    History of adenomatous polyp of colon    History of COVID-19    per pt winter 2022, mild to moderate symptoms that resolved   History of diverticulitis of colon    History of kidney stones    Hyperlipidemia    Hypothyroidism    followed by pcp   Lung nodule    s/p bronchoscopy w/ bx in 2005,  benign   Osteoporosis    Smokers' cough ([facility])    05-28-2021  pt stated occasionally productive w/ white phlegm   Wears glasses     Current Outpatient Medications on File Prior to Visit  Medication Sig Dispense Refill   albuterol (VENTOLIN HFA) 108 (90 Base) MCG/ACT inhaler INHALE 2 PUFFS EVERY 4 HOURS AS NEEDED FOR WHEEZING OR SHORTNESS OF BREATH 18 g 2   amLODipine (NORVASC) 5 MG tablet Take 1 tablet (5 mg total) by mouth daily. 90 tablet 3   aspirin EC 81 MG tablet Take 1 tablet (81 mg total) by mouth daily. Swallow whole. 90 tablet 3   Bempedoic Acid-Ezetimibe (NEXLIZET) 180-10 MG TABS Take 1 tablet by mouth daily. 90 tablet 3   finasteride (PROSCAR) 5 MG tablet  Take 1 tablet (5 mg total) by mouth daily. 30 tablet 5   Fluticasone-Umeclidin-Vilant (TRELEGY ELLIPTA) 100-62.5-25 MCG/ACT AEPB Inhale 1 puff into the lungs daily. 1 each 11   levothyroxine (SYNTHROID) 50 MCG tablet Take 1 tablet (50 mcg total) by mouth daily. 90 tablet 3   lisinopril (ZESTRIL) 20 MG tablet Take 1 tablet (20 mg total) by mouth daily. 90 tablet 3   nitroGLYCERIN (NITROSTAT) 0.4 MG SL tablet Place 1 tablet (0.4 mg total) under the tongue every 5 (five) minutes as needed for chest pain. 25 tablet 3   Current Facility-Administered Medications on File Prior to Visit  Medication Dose Route Frequency Provider Last Rate Last Admin   0.9 %  sodium chloride infusion  500 mL Intravenous Continuous Armbruster, [name] Rayas, MD        Allergies  Allergen Reactions   Crestor [Rosuvastatin Calcium] Other (See Comments)    Lethargy and muscle aches   Flomax [Tamsulosin Hcl]  Other (See Comments)    Made patient hypotensive, clammy, sweaty, and feel like he was going to pass out (EMS HAD TO BE CALLED)   Lipitor [Atorvastatin Calcium] Other (See Comments)    Body cramps, lethargy and muscle aches    Assessment/Plan:  1. Hyperlipidemia - Patient's last LDL 91 from 8/23 and then started Nexlizet. Will place lab order to update lipid panel  per patient request. Patient will have drawn in [place] and pending results, consider Repatha.  Educated patient on mechansim of action, storage, site selection, administration and possible adverse effects. Will complete PA if patient willing to start.  Recheck lipid panel 2-3 months later.  Advised to continue amlodipine daily and to call if he has any adverse effects rather than discontinuing without telling us,  Continue Nexlizet Recheck lipid panel  [name] [name], PharmD, BCACP, CDCES, CPP [address], [address] [place], [place], [address] Phone: [phone], Fax: [phone]

## 2022-11-06 ENCOUNTER — Other Ambulatory Visit: Payer: PPO

## 2022-11-06 DIAGNOSIS — I2510 Atherosclerotic heart disease of native coronary artery without angina pectoris: Secondary | ICD-10-CM | POA: Diagnosis not present

## 2022-11-06 DIAGNOSIS — M791 Myalgia, unspecified site: Secondary | ICD-10-CM | POA: Diagnosis not present

## 2022-11-06 DIAGNOSIS — E782 Mixed hyperlipidemia: Secondary | ICD-10-CM | POA: Diagnosis not present

## 2022-11-06 DIAGNOSIS — I25119 Atherosclerotic heart disease of native coronary artery with unspecified angina pectoris: Secondary | ICD-10-CM | POA: Diagnosis not present

## 2022-11-06 DIAGNOSIS — T466X5A Adverse effect of antihyperlipidemic and antiarteriosclerotic drugs, initial encounter: Secondary | ICD-10-CM | POA: Diagnosis not present

## 2022-11-07 ENCOUNTER — Encounter: Payer: Self-pay | Admitting: *Deleted

## 2022-11-07 LAB — LIPID PANEL
Chol/HDL Ratio: 2.5 ratio (ref 0.0–5.0)
Cholesterol, Total: 96 mg/dL — ABNORMAL LOW (ref 100–199)
HDL: 38 mg/dL — ABNORMAL LOW (ref >39)
LDL Chol Calc (NIH): 42 mg/dL (ref 0–99)
Triglycerides: 77 mg/dL (ref 0–149)
VLDL Cholesterol Cal: 16 mg/dL (ref 5–40)

## 2022-11-08 DIAGNOSIS — J209 Acute bronchitis, unspecified: Secondary | ICD-10-CM | POA: Diagnosis not present

## 2022-12-19 ENCOUNTER — Other Ambulatory Visit: Payer: Self-pay | Admitting: Family Medicine

## 2023-01-15 DIAGNOSIS — Z8249 Family history of ischemic heart disease and other diseases of the circulatory system: Secondary | ICD-10-CM | POA: Diagnosis not present

## 2023-01-15 DIAGNOSIS — H4710 Unspecified papilledema: Secondary | ICD-10-CM | POA: Diagnosis not present

## 2023-01-15 DIAGNOSIS — H4711 Papilledema associated with increased intracranial pressure: Secondary | ICD-10-CM | POA: Diagnosis not present

## 2023-01-15 DIAGNOSIS — F40240 Claustrophobia: Secondary | ICD-10-CM | POA: Diagnosis not present

## 2023-01-15 DIAGNOSIS — F17200 Nicotine dependence, unspecified, uncomplicated: Secondary | ICD-10-CM | POA: Diagnosis not present

## 2023-01-15 DIAGNOSIS — Z79899 Other long term (current) drug therapy: Secondary | ICD-10-CM | POA: Diagnosis not present

## 2023-01-15 DIAGNOSIS — G932 Benign intracranial hypertension: Secondary | ICD-10-CM | POA: Diagnosis not present

## 2023-01-15 DIAGNOSIS — Z800 Family history of malignant neoplasm of digestive organs: Secondary | ICD-10-CM | POA: Diagnosis not present

## 2023-01-15 DIAGNOSIS — Z7982 Long term (current) use of aspirin: Secondary | ICD-10-CM | POA: Diagnosis not present

## 2023-01-15 DIAGNOSIS — I10 Essential (primary) hypertension: Secondary | ICD-10-CM | POA: Diagnosis not present

## 2023-01-16 DIAGNOSIS — H4710 Unspecified papilledema: Secondary | ICD-10-CM | POA: Diagnosis not present

## 2023-01-29 ENCOUNTER — Other Ambulatory Visit: Payer: Self-pay | Admitting: Family Medicine

## 2023-01-29 DIAGNOSIS — I10 Essential (primary) hypertension: Secondary | ICD-10-CM

## 2023-01-29 DIAGNOSIS — I25119 Atherosclerotic heart disease of native coronary artery with unspecified angina pectoris: Secondary | ICD-10-CM

## 2023-01-30 DIAGNOSIS — H4710 Unspecified papilledema: Secondary | ICD-10-CM | POA: Diagnosis not present

## 2023-02-17 ENCOUNTER — Other Ambulatory Visit: Payer: Self-pay | Admitting: Family Medicine

## 2023-02-17 DIAGNOSIS — E782 Mixed hyperlipidemia: Secondary | ICD-10-CM

## 2023-02-17 DIAGNOSIS — I25119 Atherosclerotic heart disease of native coronary artery with unspecified angina pectoris: Secondary | ICD-10-CM

## 2023-02-17 DIAGNOSIS — K219 Gastro-esophageal reflux disease without esophagitis: Secondary | ICD-10-CM

## 2023-02-18 IMAGING — DX DG ABDOMEN 1V
2 series · 2 of 2 positions shown · non-contrast
Comparison: 03/07/2013

CLINICAL DATA: Recurrent abdominal bloating

EXAM:
ABDOMEN - 1 VIEW

[abdomen kub (1 of 2)]
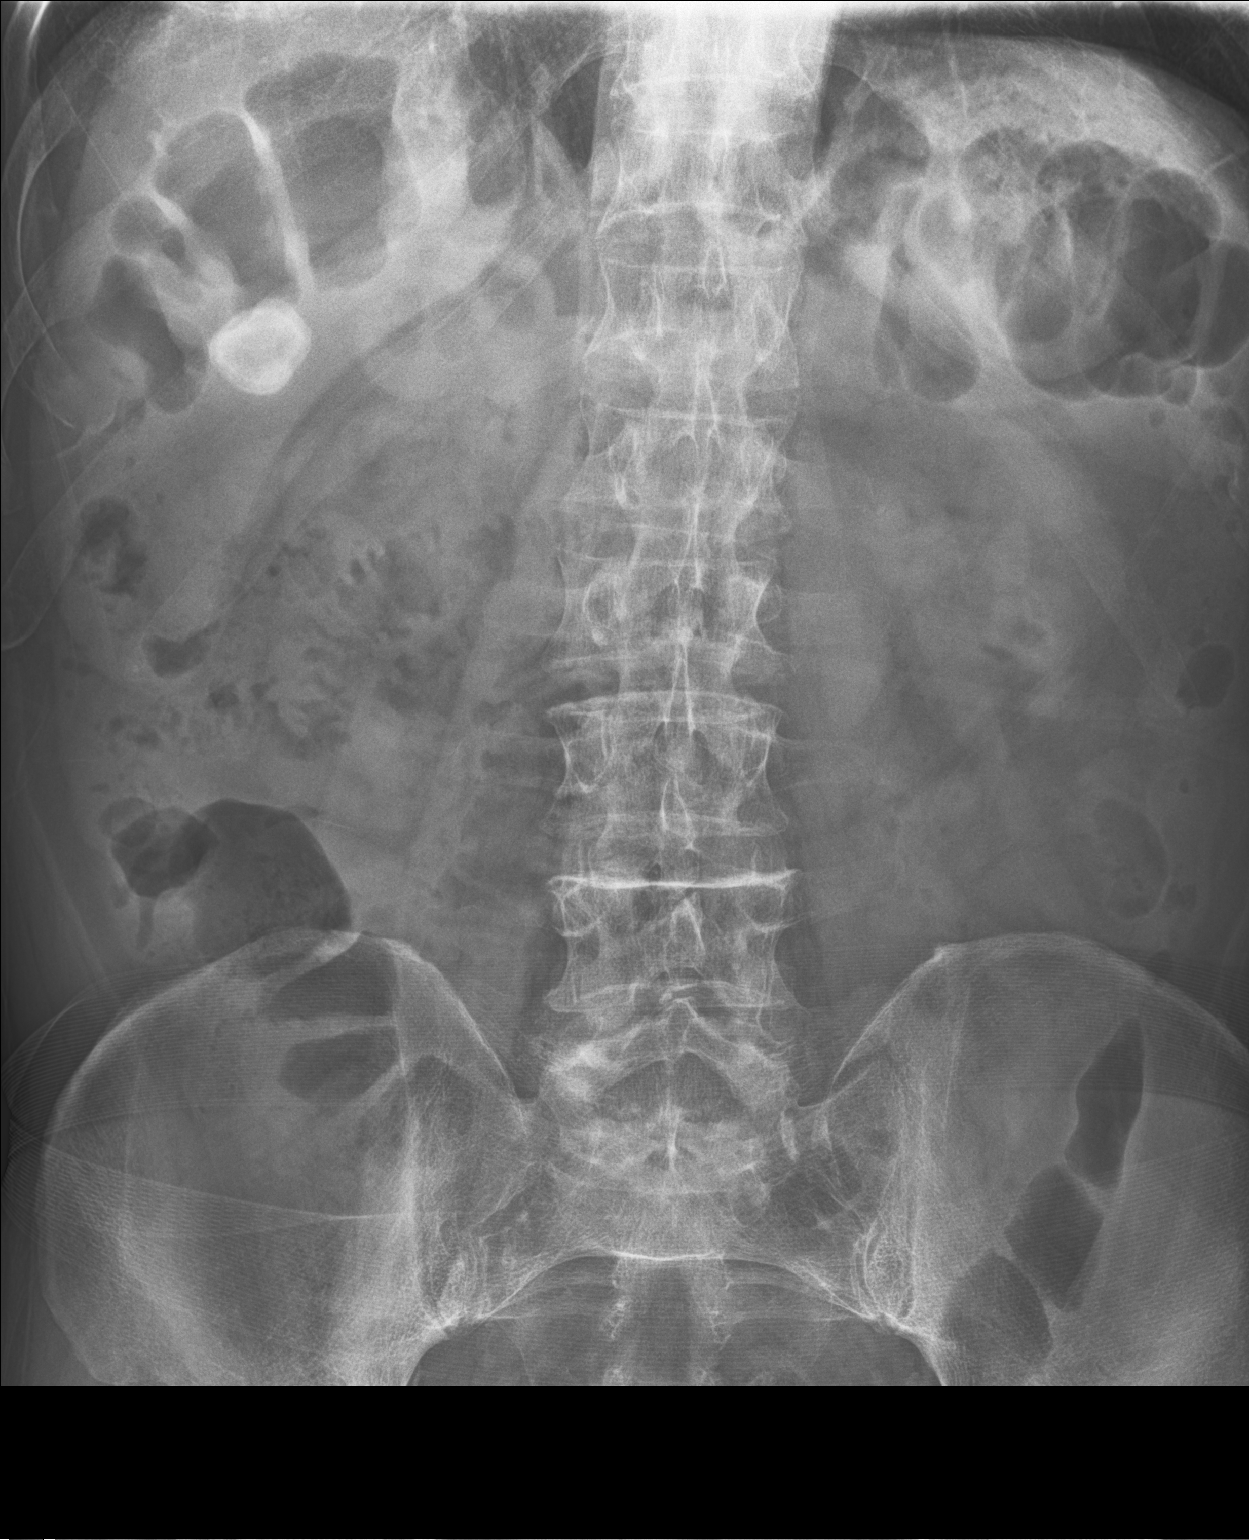

[abdomen kub (2 of 2)]
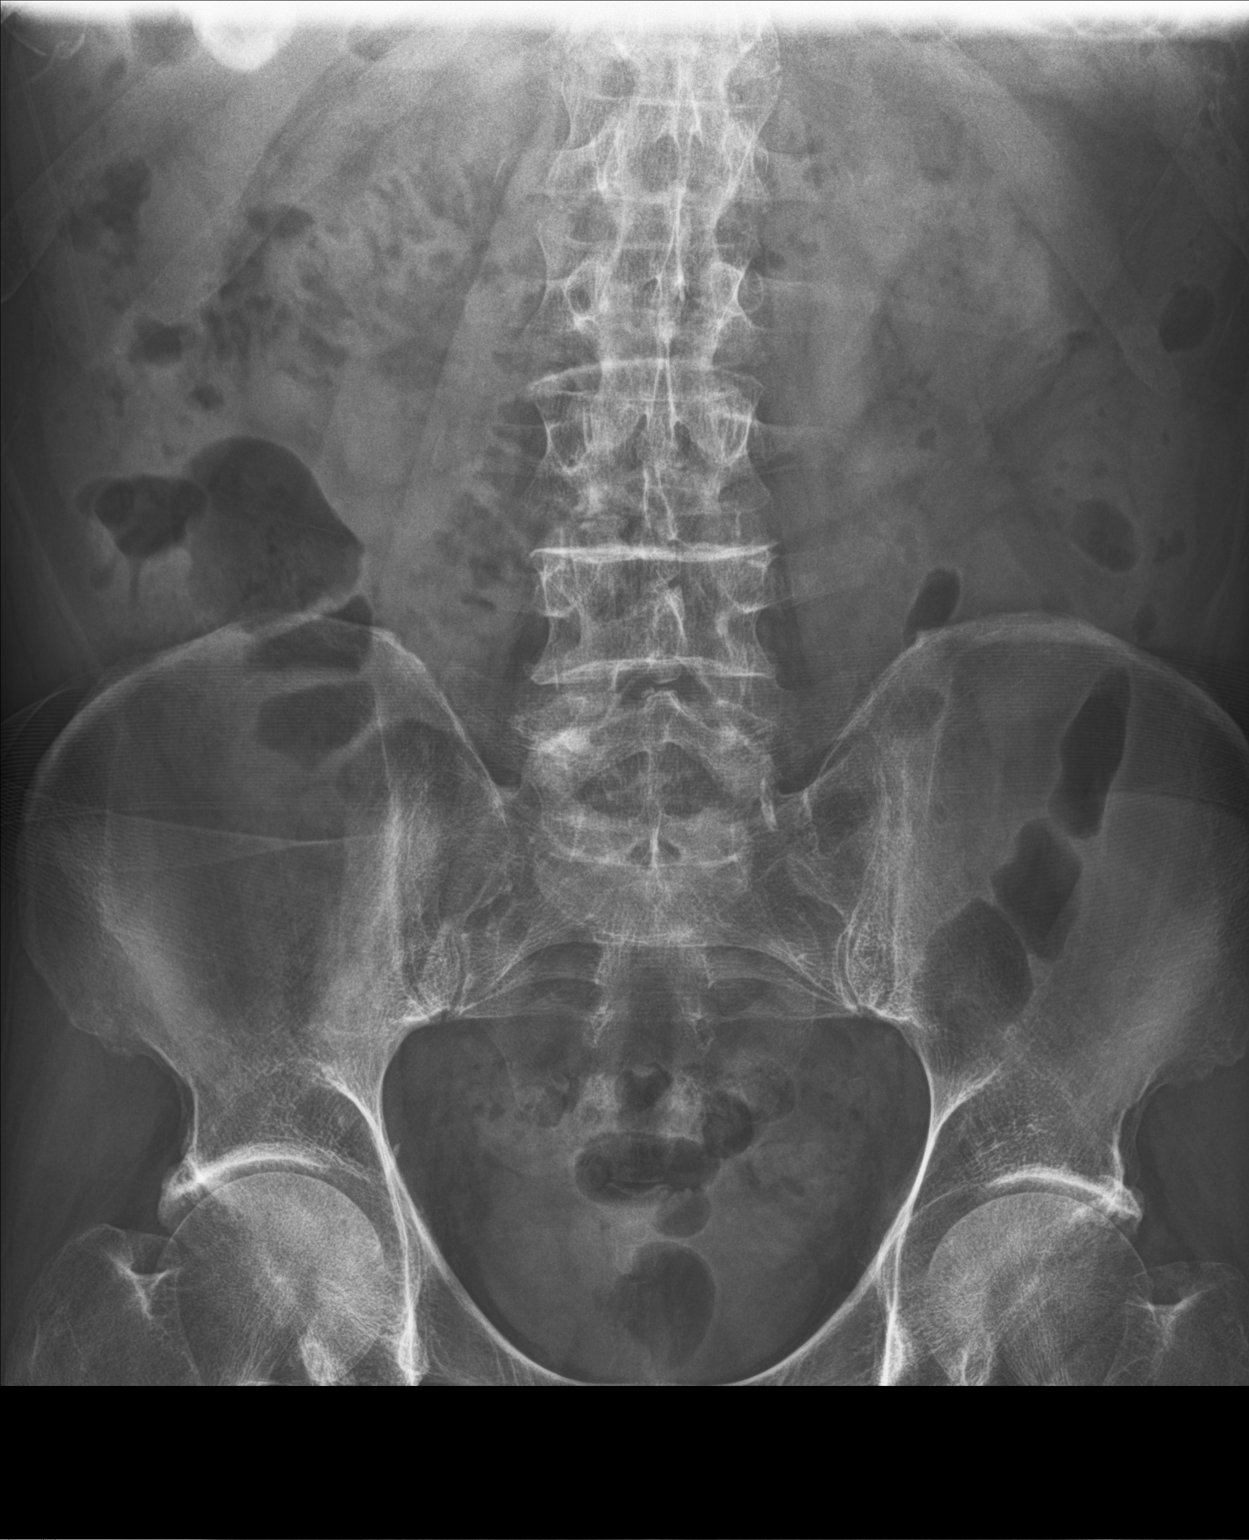

[2 of 2 positions shown; findings below may reference images not displayed]

FINDINGS: Rounded calcification over the right upper quadrant, cholelithiasis
seen since at least 2998 comparison. Normal bowel gas pattern. No
abnormal stool retention. No focal osseous finding.
IMPRESSION: 1. No acute finding.  No stool retention.
2. Longstanding cholelithiasis.

## 2023-03-06 ENCOUNTER — Other Ambulatory Visit: Payer: Self-pay | Admitting: Family Medicine

## 2023-03-06 DIAGNOSIS — I10 Essential (primary) hypertension: Secondary | ICD-10-CM

## 2023-03-06 DIAGNOSIS — I25119 Atherosclerotic heart disease of native coronary artery with unspecified angina pectoris: Secondary | ICD-10-CM

## 2023-04-01 ENCOUNTER — Ambulatory Visit: Payer: PPO

## 2023-04-01 VITALS — Ht 70.0 in | Wt 150.0 lb

## 2023-04-01 DIAGNOSIS — Z0000 Encounter for general adult medical examination without abnormal findings: Secondary | ICD-10-CM

## 2023-04-01 NOTE — Patient Instructions (Signed)
[name] [name] , Thank you for taking time to come for your Medicare Wellness Visit. I appreciate your ongoing commitment to your health goals. Please review the following plan we discussed and let me know if I can assist you in the future.   Referrals/Orders/Follow-Ups/Clinician Recommendations: Aim for 30 minutes of exercise or brisk walking, 6-8 glasses of water, and 5 servings of fruits and vegetables each day.   This is a list of the screening recommended for you and due dates:  Health Maintenance  Topic Date Due   COVID-19 Vaccine (1) Never done   DTaP/Tdap/Td vaccine (1 - Tdap) Never done   Flu Shot  Never done   Zoster (Shingles) Vaccine (1 of 2) 08/25/2023*   Screening for Lung Cancer  07/11/2023   Medicare Annual Wellness Visit  03/31/2024   Colon Cancer Screening  05/02/2025   Pneumonia Vaccine  Completed   Hepatitis C Screening  Completed   HPV Vaccine  Aged Out  *Topic was postponed. The date shown is not the original due date.    Advanced directives: (Provided) Advance directive discussed with you today. I have provided a copy for you to complete at home and have notarized. Once this is complete, please bring a copy in to our office so we can scan it into your chart. Information on Advanced Care Planning can be found at [facility] Advance Health Care Directives Advance Health Care Directives ([email or web address]    Next Medicare Annual Wellness Visit scheduled for next year: Yes  insert Preventive Care attachment Insert FALL PREVENTION attachment if needed

## 2023-04-01 NOTE — Progress Notes (Signed)
Subjective:   [name] [name] is a 72 y.o. male who presents for Medicare Annual/Subsequent preventive examination.  Visit Complete: Virtual I connected with  [name] [name] on 04/01/23 by a audio enabled telemedicine application and verified that I am speaking with the correct person using two identifiers.  Patient Location: Home  Provider Location: Home Office  I discussed the limitations of evaluation and management by telemedicine. The patient expressed understanding and agreed to proceed.  Vital Signs: Because this visit was a virtual/telehealth visit, some criteria may be missing or patient reported. Any vitals not documented were not able to be obtained and vitals that have been documented are patient reported.  Patient Medicare AWV questionnaire was completed by the patient on 04/01/2023 ; I have confirmed that all information answered by patient is correct and no changes since this date.  Cardiac Risk Factors include: advanced age (>57men, >50 women);male gender;dyslipidemia;hypertension     Objective:    Today's Vitals   04/01/23 1224  Weight: 150 lb (68 kg)  Height: 5\' 10"  (1.778 m)   Body mass index is 21.52 kg/m.     04/01/2023   12:27 PM 04/15/2022    9:10 AM 05/30/2021    7:49 AM 04/10/2021    4:13 PM 07/08/2019    8:50 AM 07/05/2018    9:06 AM 08/04/2017   12:05 PM  Advanced Directives  Does Patient Have a Medical Advance Directive? No No No No No No No  Would patient like information on creating a medical advance directive? Yes (MAU/Ambulatory/Procedural Areas - Information given) No - Patient declined No - Patient declined No - Patient declined No - Patient declined Yes (MAU/Ambulatory/Procedural Areas - Information given) No - Patient declined    Current Medications (verified) Outpatient Encounter Medications as of 04/01/2023  Medication Sig   albuterol (VENTOLIN HFA) 108 (90 Base) MCG/ACT inhaler INHALE TWO PUFFS EVERY 6 HOURS AS NEEDED    amLODipine (NORVASC) 5 MG tablet Take 1 tablet (5 mg total) by mouth daily.   aspirin EC 81 MG tablet Take 1 tablet (81 mg total) by mouth daily. Swallow whole.   Bempedoic Acid-Ezetimibe (NEXLIZET) 180-10 MG TABS TAKE ONE TABLET ONCE DAILY   finasteride (PROSCAR) 5 MG tablet Take 1 tablet (5 mg total) by mouth daily.   Fluticasone-Umeclidin-Vilant (TRELEGY ELLIPTA) 100-62.5-25 MCG/ACT AEPB Inhale 1 puff into the lungs daily.   levothyroxine (SYNTHROID) 50 MCG tablet Take 1 tablet (50 mcg total) by mouth daily.   lisinopril (ZESTRIL) 20 MG tablet Take 1 tablet (20 mg total) by mouth daily.   nitroGLYCERIN (NITROSTAT) 0.4 MG SL tablet Place 1 tablet (0.4 mg total) under the tongue every 5 (five) minutes as needed for chest pain.   Facility-Administered Encounter Medications as of 04/01/2023  Medication   0.9 %  sodium chloride infusion    Allergies (verified) Crestor [rosuvastatin calcium], Flomax [tamsulosin hcl], and Lipitor [atorvastatin calcium]   History: Past Medical History:  Diagnosis Date   Arthritis    Carotid artery stenosis without cerebral infarction    vascular --- dr Edilia Bo;   09-24-2007  s/p right CEA   Chronic cholecystitis    w/  large calcified stone in gallbladder   Chronic constipation    COPD (chronic obstructive pulmonary disease) (HCC)    followed by pcp----  (05-28-2021  pt stated last used rescue 05-27-2021 for wheezing resolved after use, stated exacerbation approx  greater than a year ago)   Coronary artery disease 2002   cardiologist-- dr  hochrein ;  (05-28-2021  pt denies need to use nitro for CP) per last cath 08-04-2017  LM luminal irregularities, LAD 30% ,D1 ostial 35% ,CFx   AV groove ostial 25% , OM1 long prox 30%,  proxRCA occluded and dominant,   excellent left to right collateral filling.   Depression    Patient denies   DOE (dyspnea on exertion)    due to smoking/ copd   Dyslipidemia    Essential hypertension    Full dentures    GERD  (gastroesophageal reflux disease)    History of adenomatous polyp of colon    History of COVID-19    per pt winter 2022, mild to moderate symptoms that resolved   History of diverticulitis of colon    History of kidney stones    Hyperlipidemia    Hypothyroidism    followed by pcp   Lung nodule    s/p bronchoscopy w/ bx in 2005,  benign   Osteoporosis    Smokers' cough (HCC)    05-28-2021  pt stated occasionally productive w/ white phlegm   Wears glasses    Past Surgical History:  Procedure Laterality Date   BRONCHOSCOPY  2005   CARDIAC CATHETERIZATION  08/11/2000   @[facility]  by dr Riley Kill;   preserved LVF,  ostial RCA 70-75%, other nonobstructive disease   CARDIAC CATHETERIZATION  03/30/2008   @[facility]  by dr hochrein;  occluded RCA and other nonobstructive involving LAD, CFx, D1,OM1  and left to right collateral filling,  mild inferior hypokinesis,  ef 55%   CAROTID ENDARTERECTOMY Right 09/24/2007   @[facility]  by dr Edilia Bo   COLONOSCOPY  01/19/2017   by armsbruster   EXCISION NEUROMA Left 10/01/2005   @[facility] ;  left inguinal   EXTRACORPOREAL SHOCK WAVE LITHOTRIPSY  2015   INGUINAL HERNIA REPAIR Left    01-13-2003 and 07-22-2006 both @[facility]    LAPAROSCOPIC CHOLECYSTECTOMY SINGLE SITE WITH INTRAOPERATIVE CHOLANGIOGRAM N/A 05/30/2021   Procedure: LAPAROSCOPIC CHOLECYSTECTOMY SINGLE SITE WITH INTRAOPERATIVE CHOLANGIOGRAM;  Surgeon: Karie Soda, MD;  Location: [facility] ;  Service: General;  Laterality: N/A;   LAPAROSCOPIC INGUINAL HERNIA REPAIR Bilateral 04/28/2007   @[facility]    LEFT HEART CATH AND CORONARY ANGIOGRAPHY N/A 08/04/2017   Procedure: LEFT HEART CATH AND CORONARY ANGIOGRAPHY;  Surgeon: Kathleene Hazel, MD;  Location: [facility];  Service: Cardiovascular;  Laterality: N/A;   THROAT SURGERY  1997   pt stated removal of tumor that was benign   Family History  Problem Relation Age of Onset   Lupus Mother    Lung disease Father    Lupus Brother    Alcohol abuse  Brother    Lung disease Brother    Colon polyps Brother    Colon cancer Brother 25   Cancer Brother        colon   Lung disease Brother    Healthy Daughter    Healthy Son    Healthy Son    Healthy Son    Esophageal cancer Neg Hx    Rectal cancer Neg Hx    Stomach cancer Neg Hx    Pancreatic cancer Neg Hx    Crohn's disease Neg Hx    Ulcerative colitis Neg Hx    Social History   Socioeconomic History   Marital status: Married    Spouse name: [name]   Number of children: 4   Years of education: high school diploma   Highest education level: 12th grade  Occupational History   Occupation: truck Hospital doctor  Employer: [employer]  Tobacco Use   Smoking status: Every Day    Current packs/day: 1.00    Average packs/day: 1 pack/day for 58.0 years (58.0 ttl pk-yrs)    Types: Cigarettes   Smokeless tobacco: Never  Vaping Use   Vaping status: Never Used  Substance and Sexual Activity   Alcohol use: Yes    Alcohol/week: 2.0 standard drinks of alcohol    Types: 2 Standard drinks or equivalent per week   Drug use: No   Sexual activity: Yes    Birth control/protection: None  Other Topics Concern   Not on file  Social History Narrative   Lives with wife in a split level home.    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   Social Determinants of Health   Financial Resource Strain: Low Risk  (04/01/2023)   Overall Financial Resource Strain (CARDIA)    Difficulty of Paying Living Expenses: Not hard at all  Food Insecurity: No Food Insecurity (04/01/2023)   Hunger Vital Sign    Worried About Running Out of Food in the Last Year: Never true    Ran Out of Food in the Last Year: Never true  Transportation Needs: No Transportation Needs (04/01/2023)   PRAPARE - Administrator, Civil Service (Medical): No    Lack of  Transportation (Non-Medical): No  Physical Activity: Sufficiently Active (04/01/2023)   Exercise Vital Sign    Days of Exercise per Week: 5 days    Minutes of Exercise per Session: 30 min  Stress: No Stress Concern Present (04/01/2023)   [name]    Feeling of Stress : Not at all  Social Connections: Socially Integrated (04/01/2023)   Social Connection and Isolation Panel [NHANES]    Frequency of Communication with Friends and Family: More than three times a week    Frequency of Social Gatherings with Friends and Family: More than three times a week    Attends Religious Services: More than 4 times per year    Active Member of [employer] or Organizations: Yes    Attends Engineer, structural: More than 4 times per year    Marital Status: Married    Tobacco Counseling Ready to quit: No Counseling given: Not Answered   Clinical Intake:  Pre-visit preparation completed: Yes  Pain : No/denies pain     Nutritional Risks: None Diabetes: No  How often do you need to have someone help you when you read instructions, pamphlets, or other written materials from your doctor or pharmacy?: 1 - Never  Interpreter Needed?: No  Information entered by :: Renie Ora, LPN   Activities of Daily Living    04/01/2023   12:27 PM 04/15/2022    9:10 AM  In your present state of health, do you have any difficulty performing the following activities:  Hearing? 0 0  Vision? 0 0  Difficulty concentrating or making decisions? 0 1  Comment  normal memory issues as he ages  Walking or climbing stairs? 0 0  Dressing or bathing? 0   Doing errands, shopping? 0 0  Preparing Food and eating ? N N  Using the Toilet? N N  In the past six months, have you accidently leaked urine? N N  Do you have problems  with loss of bowel control? N N  Managing your Medications? N N  Managing your Finances? N N  Housekeeping or managing your  Housekeeping? N N    Patient Care Team: Dettinger, Elige Radon, MD as PCP - General (Family Medicine) Rollene Rotunda, MD as PCP - Cardiology (Cardiology) Rollene Rotunda, MD as Consulting Physician (Cardiology) Danella Maiers, [facility] (Pharmacist) Karie Soda, MD as Consulting Physician (General Surgery) Armbruster, Willaim Rayas, MD as Consulting Physician (Gastroenterology)  Indicate any recent Medical Services you may have received from other than [facility] providers in the past year (date may be approximate).     Assessment:   This is a routine wellness examination for [name] [name].  Hearing/Vision screen Vision Screening - Comments:: Wears rx glasses - up to date with routine eye exams with  Dr.johnson    Goals Addressed             This Visit's Progress    DIET - INCREASE WATER INTAKE   On track    Try to drink 6-8 glasses of water daily       Depression Screen    04/01/2023   12:26 PM 08/25/2022   10:20 AM 06/13/2022   12:30 PM 04/15/2022    9:07 AM 02/19/2022    8:39 AM 02/06/2022    3:38 PM 07/29/2021   11:13 AM  PHQ 2/9 Scores  PHQ - 2 Score 0 0 0 0 0 0 0  PHQ- 9 Score  0 0 0 0  0    Fall Risk    04/01/2023   12:25 PM 08/25/2022   10:20 AM 06/13/2022   12:30 PM 04/15/2022    9:15 AM 02/19/2022    8:38 AM  Fall Risk   Falls in the past year? 0 0 0 0 0  Number falls in past yr: 0      Injury with Fall? 0      Risk for fall due to : No Fall Risks      Follow up Falls prevention discussed   Falls evaluation completed     MEDICARE RISK AT HOME: Medicare Risk at Home Any stairs in or around the home?: Yes If so, are there any without handrails?: No Home free of loose throw rugs in walkways, pet beds, electrical cords, etc?: Yes Adequate lighting in your home to reduce risk of falls?: Yes Life alert?: No Use of a cane, walker or w/c?: No Grab bars in the bathroom?: Yes Shower chair or bench in shower?: Yes Elevated toilet seat or a handicapped toilet?: Yes  TIMED UP  AND GO:  Was the test performed?  No    Cognitive Function:    07/05/2018    9:38 AM 06/29/2017   11:59 AM  MMSE - Mini Mental State Exam  Orientation to time 5 5  Orientation to Place 5 5  Registration 3 3  Attention/ Calculation 5 3  Recall 3 3  Language- name 2 objects 2 2  Language- repeat 1 1  Language- follow 3 step command 3 3  Language- read & follow direction 1 1  Write a sentence 1 1  Copy design 1 1  Total score 30 28        04/01/2023   12:27 PM 04/15/2022    9:12 AM 07/08/2019    8:54 AM  6CIT Screen  What Year? 0 points  0 points  What month? 0 points  0 points  What time? 0 points 0 points 0 points  Count  back from 20 0 points 0 points 0 points  Months in reverse 0 points 2 points 2 points  Repeat phrase 0 points 0 points 0 points  Total Score 0 points  2 points    Immunizations Immunization History  Administered Date(s) Administered   Pneumococcal Conjugate-13 01/07/2016   Pneumococcal Polysaccharide-23 06/29/2017    TDAP status: Due, Education has been provided regarding the importance of this vaccine. Advised may receive this vaccine at local pharmacy or Health Dept. Aware to provide a copy of the vaccination record if obtained from local pharmacy or Health Dept. Verbalized acceptance and understanding.  Flu Vaccine status: Declined, Education has been provided regarding the importance of this vaccine but patient still declined. Advised may receive this vaccine at local pharmacy or Health Dept. Aware to provide a copy of the vaccination record if obtained from local pharmacy or Health Dept. Verbalized acceptance and understanding.  Pneumococcal vaccine status: Up to date  Covid-19 vaccine status: Declined, Education has been provided regarding the importance of this vaccine but patient still declined. Advised may receive this vaccine at local pharmacy or Health Dept.or vaccine clinic. Aware to provide a copy of the vaccination record if obtained from  local pharmacy or Health Dept. Verbalized acceptance and understanding.  Qualifies for Shingles Vaccine? Yes   Zostavax completed No   Shingrix Completed?: No.    Education has been provided regarding the importance of this vaccine. Patient has been advised to call insurance company to determine out of pocket expense if they have not yet received this vaccine. Advised may also receive vaccine at local pharmacy or Health Dept. Verbalized acceptance and understanding.  Screening Tests Health Maintenance  Topic Date Due   COVID-19 Vaccine (1) Never done   DTaP/Tdap/Td (1 - Tdap) Never done   INFLUENZA VACCINE  Never done   Zoster Vaccines- Shingrix (1 of 2) 08/25/2023 (Originally 12/14/1969)   Lung Cancer Screening  07/11/2023   Medicare Annual Wellness (AWV)  03/31/2024   Colonoscopy  05/02/2025   Pneumonia Vaccine 81+ Years old  Completed   Hepatitis C Screening  Completed   HPV VACCINES  Aged Out    Health Maintenance  Health Maintenance Due  Topic Date Due   COVID-19 Vaccine (1) Never done   DTaP/Tdap/Td (1 - Tdap) Never done   INFLUENZA VACCINE  Never done    Colorectal cancer screening: Type of screening: Colonoscopy. Completed 05/02/2022. Repeat every 3 years  Lung Cancer Screening: (Low Dose CT Chest recommended if Age 69-80 years, 20 pack-year currently smoking OR have quit w/in 15years.) does qualify.   Lung Cancer Screening Referral: referral 07/11/2022  Additional Screening:  Hepatitis C Screening: does not qualify; Completed 01/07/2016  Vision Screening: Recommended annual ophthalmology exams for early detection of glaucoma and other disorders of the eye. Is the patient up to date with their annual eye exam?  Yes  Who is the provider or what is the name of the office in which the patient attends annual eye exams? Dr.Johnson  If pt is not established with a provider, would they like to be referred to a provider to establish care? No .   Dental Screening:  Recommended annual dental exams for proper oral hygiene    Community Resource Referral / Chronic Care Management: CRR required this visit?  No   CCM required this visit?  No     Plan:     I have personally reviewed and noted the following in the patient's chart:   Medical and  social history Use of alcohol, tobacco or illicit drugs  Current medications and supplements including opioid prescriptions. Patient is not currently taking opioid prescriptions. Functional ability and status Nutritional status Physical activity Advanced directives List of other physicians Hospitalizations, surgeries, and ER visits in previous 12 months Vitals Screenings to include cognitive, depression, and falls Referrals and appointments  In addition, I have reviewed and discussed with patient certain preventive protocols, quality metrics, and best practice recommendations. A written personalized care plan for preventive services as well as general preventive health recommendations were provided to patient.     Lorrene Reid, LPN   16/06/958   After Visit Summary: (MyChart) Due to this being a telephonic visit, the after visit summary with patients personalized plan was offered to patient via MyChart   Nurse Notes: none

## 2023-04-03 ENCOUNTER — Ambulatory Visit (INDEPENDENT_AMBULATORY_CARE_PROVIDER_SITE_OTHER): Payer: PPO | Admitting: Family Medicine

## 2023-04-03 ENCOUNTER — Encounter: Payer: Self-pay | Admitting: Family Medicine

## 2023-04-03 VITALS — BP 139/79 | HR 54 | Ht 70.0 in | Wt 140.0 lb

## 2023-04-03 DIAGNOSIS — E782 Mixed hyperlipidemia: Secondary | ICD-10-CM | POA: Diagnosis not present

## 2023-04-03 DIAGNOSIS — I25119 Atherosclerotic heart disease of native coronary artery with unspecified angina pectoris: Secondary | ICD-10-CM | POA: Diagnosis not present

## 2023-04-03 DIAGNOSIS — E039 Hypothyroidism, unspecified: Secondary | ICD-10-CM

## 2023-04-03 DIAGNOSIS — I2510 Atherosclerotic heart disease of native coronary artery without angina pectoris: Secondary | ICD-10-CM | POA: Diagnosis not present

## 2023-04-03 DIAGNOSIS — K219 Gastro-esophageal reflux disease without esophagitis: Secondary | ICD-10-CM

## 2023-04-03 DIAGNOSIS — J449 Chronic obstructive pulmonary disease, unspecified: Secondary | ICD-10-CM

## 2023-04-03 DIAGNOSIS — I10 Essential (primary) hypertension: Secondary | ICD-10-CM

## 2023-04-03 MED ORDER — LEVOTHYROXINE SODIUM 50 MCG PO TABS
50.0000 ug | ORAL_TABLET | Freq: Every day | ORAL | 3 refills | Status: DC
Start: 2023-04-03 — End: 2024-01-31

## 2023-04-03 MED ORDER — NEXLIZET 180-10 MG PO TABS
1.0000 | ORAL_TABLET | Freq: Every day | ORAL | 0 refills | Status: DC
Start: 2023-04-03 — End: 2023-12-22

## 2023-04-03 MED ORDER — LISINOPRIL 20 MG PO TABS
20.0000 mg | ORAL_TABLET | Freq: Every day | ORAL | 0 refills | Status: DC
Start: 2023-04-03 — End: 2023-05-06

## 2023-04-03 MED ORDER — BREZTRI AEROSPHERE 160-9-4.8 MCG/ACT IN AERO
2.0000 | INHALATION_SPRAY | Freq: Two times a day (BID) | RESPIRATORY_TRACT | 11 refills | Status: DC
Start: 2023-04-03 — End: 2023-06-18

## 2023-04-03 MED ORDER — PANTOPRAZOLE SODIUM 20 MG PO TBEC
20.0000 mg | DELAYED_RELEASE_TABLET | Freq: Every day | ORAL | 3 refills | Status: DC
Start: 2023-04-03 — End: 2023-06-18

## 2023-04-03 NOTE — Progress Notes (Signed)
BP 139/79   Pulse (!) 54   Ht 5\' 10"  (1.778 m)   Wt 140 lb (63.5 kg)   SpO2 94%   BMI 20.09 kg/m    Subjective:   Patient ID: [name] [name], male    DOB: [birth year], 72 y.o.   MRN: [id number]  HPI: [name] [name] is a 72 y.o. male presenting on 04/03/2023 for Medical Management of Chronic Issues, Hypothyroidism, and Hypertension   HPI Hypertension Patient is currently on amlodipine and lisinopril, and their blood pressure today is 139/79. Patient denies any lightheadedness or dizziness. Patient denies headaches, blurred vision, chest pains, shortness of breath, or weakness. Denies any side effects from medication and is content with current medication.   Hyperlipidemia and CAD and coronary arthrosclerosis Patient is coming in for recheck of his hyperlipidemia. The patient is currently taking Nexlezet. They deny any issues with myalgias or history of liver damage from it. They deny any focal numbness or weakness or chest pain.   Hypothyroidism recheck Patient is coming in for thyroid recheck today as well. They deny any issues with hair changes or heat or cold problems or diarrhea or constipation. They deny any chest pain or palpitations. They are currently on levothyroxine 50 micrograms   COPD Patient is coming in for COPD recheck today.  He is currently on Trelegy and albuterol.  He has a mild chronic cough but denies any major coughing spells or wheezing spells.  He has 0 nighttime symptoms per week and 3 daytime symptoms per week currently.  He says his biggest issue is sometimes in the afternoons he feels like the Trelegy wears off and that also it has been very pricey for him and he would like to try to see if there is something else cheaper  Patient says has been getting little acid reflux and he wants to get back the medicine that he used to have for it because it did work.  He has not had it in a little bit.  Relevant past medical, surgical, family and social history  reviewed and updated as indicated. Interim medical history since our last visit reviewed. Allergies and medications reviewed and updated.  Review of Systems  Constitutional:  Negative for chills and fever.  HENT:  Negative for congestion.   Eyes:  Negative for discharge.  Respiratory:  Positive for cough and wheezing. Negative for shortness of breath.   Cardiovascular:  Negative for chest pain and leg swelling.  Musculoskeletal:  Negative for back pain and gait problem.  Skin:  Negative for rash.  All other systems reviewed and are negative.   Per HPI unless specifically indicated above   Allergies as of 04/03/2023       Reactions   Crestor [rosuvastatin Calcium] Other (See Comments)   Lethargy and muscle aches   Flomax [tamsulosin Hcl] Other (See Comments)   Made patient hypotensive, clammy, sweaty, and feel like he was going to pass out (EMS HAD TO BE CALLED)   Lipitor [atorvastatin Calcium] Other (See Comments)   Body cramps, lethargy and muscle aches        Medication List        Accurate as of April 03, 2023 11:28 AM. If you have any questions, ask your nurse or doctor.          STOP taking these medications    Trelegy Ellipta 100-62.5-25 MCG/ACT Aepb Generic drug: Fluticasone-Umeclidin-Vilant Stopped by: Elige Radon Shivam Mestas       TAKE these medications  albuterol 108 (90 Base) MCG/ACT inhaler Commonly known as: VENTOLIN HFA INHALE TWO PUFFS EVERY 6 HOURS AS NEEDED   amLODipine 5 MG tablet Commonly known as: NORVASC Take 1 tablet (5 mg total) by mouth daily.   aspirin EC 81 MG tablet Take 1 tablet (81 mg total) by mouth daily. Swallow whole.   Breztri Aerosphere 160-9-4.8 MCG/ACT Aero Generic drug: Budeson-Glycopyrrol-Formoterol Inhale 2 puffs into the lungs 2 (two) times daily. Started by: Elige Radon Wendy Hoback   finasteride 5 MG tablet Commonly known as: Proscar Take 1 tablet (5 mg total) by mouth daily.   levothyroxine 50 MCG  tablet Commonly known as: SYNTHROID Take 1 tablet (50 mcg total) by mouth daily.   lisinopril 20 MG tablet Commonly known as: ZESTRIL Take 1 tablet (20 mg total) by mouth daily.   Nexlizet 180-10 MG Tabs Generic drug: Bempedoic Acid-Ezetimibe Take 1 tablet by mouth daily.   nitroGLYCERIN 0.4 MG SL tablet Commonly known as: NITROSTAT Place 1 tablet (0.4 mg total) under the tongue every 5 (five) minutes as needed for chest pain.   pantoprazole 20 MG tablet Commonly known as: Protonix Take 1 tablet (20 mg total) by mouth daily. Started by: Elige Radon Dorena Dorfman         Objective:   BP 139/79   Pulse (!) 54   Ht 5\' 10"  (1.778 m)   Wt 140 lb (63.5 kg)   SpO2 94%   BMI 20.09 kg/m   Wt Readings from Last 3 Encounters:  04/03/23 140 lb (63.5 kg)  04/01/23 150 lb (68 kg)  10/29/22 148 lb (67.1 kg)    Physical Exam Vitals and nursing note reviewed.  Constitutional:      General: He is not in acute distress.    Appearance: He is well-developed. He is not diaphoretic.  Eyes:     General: No scleral icterus.    Conjunctiva/sclera: Conjunctivae normal.  Neck:     Thyroid: No thyromegaly.  Cardiovascular:     Rate and Rhythm: Normal rate and regular rhythm.     Heart sounds: Normal heart sounds. No murmur heard. Pulmonary:     Effort: Pulmonary effort is normal. No respiratory distress.     Breath sounds: Normal breath sounds. No wheezing.  Abdominal:     General: Abdomen is flat. Bowel sounds are normal. There is no distension.     Tenderness: There is no abdominal tenderness.     Hernia: No hernia is present.  Musculoskeletal:        General: No swelling. Normal range of motion.     Cervical back: Neck supple.  Lymphadenopathy:     Cervical: No cervical adenopathy.  Skin:    General: Skin is warm and dry.     Findings: No rash.  Neurological:     Mental Status: He is alert and oriented to person, place, and time.     Coordination: Coordination normal.   Psychiatric:        Behavior: Behavior normal.       Assessment & Plan:   Problem List Items Addressed This Visit       Cardiovascular and Mediastinum   Essential hypertension   Relevant Medications   Bempedoic Acid-Ezetimibe (NEXLIZET) 180-10 MG TABS   lisinopril (ZESTRIL) 20 MG tablet   Coronary artery disease involving native coronary artery of native heart with angina pectoris (HCC)   Relevant Medications   Bempedoic Acid-Ezetimibe (NEXLIZET) 180-10 MG TABS   lisinopril (ZESTRIL) 20 MG tablet   Coronary atherosclerosis  Relevant Medications   Bempedoic Acid-Ezetimibe (NEXLIZET) 180-10 MG TABS   lisinopril (ZESTRIL) 20 MG tablet     Respiratory   COPD (chronic obstructive pulmonary disease) (HCC)   Relevant Medications   Budeson-Glycopyrrol-Formoterol (BREZTRI AEROSPHERE) 160-9-4.8 MCG/ACT AERO     Digestive   GERD (gastroesophageal reflux disease)   Relevant Medications   pantoprazole (PROTONIX) 20 MG tablet     Endocrine   Hypothyroidism   Relevant Medications   levothyroxine (SYNTHROID) 50 MCG tablet   Other Relevant Orders   TSH     Other   HLD (hyperlipidemia) - Primary   Relevant Medications   Bempedoic Acid-Ezetimibe (NEXLIZET) 180-10 MG TABS   lisinopril (ZESTRIL) 20 MG tablet   Other Relevant Orders   CBC with Differential/Platelet   CMP14+EGFR   Lipid panel  Will restart pantoprazole for his indigestion. Will also check blood work today. Will send breast tree and he can see if that is cheaper and then he can use it twice a day and see if that does better for his breathing in the afternoons when he has some issues  Follow up plan: Return in about 6 months (around 10/02/2023), or if symptoms worsen or fail to improve, for Hypertension and hyperlipidemia and thyroid.  Counseling provided for all of the vaccine components Orders Placed This Encounter  Procedures   CBC with Differential/Platelet   CMP14+EGFR   Lipid panel   TSH    Arville Care, MD [facility] 04/03/2023, 11:28 AM

## 2023-04-04 LAB — LIPID PANEL
Chol/HDL Ratio: 2.9 {ratio} (ref 0.0–5.0)
Cholesterol, Total: 129 mg/dL (ref 100–199)
HDL: 45 mg/dL (ref >39)
LDL Chol Calc (NIH): 65 mg/dL (ref 0–99)
Triglycerides: 99 mg/dL (ref 0–149)
VLDL Cholesterol Cal: 19 mg/dL (ref 5–40)

## 2023-04-04 LAB — CMP14+EGFR
ALT: 9 [IU]/L (ref 0–44)
AST: 17 [IU]/L (ref 0–40)
Albumin: 4.4 g/dL (ref 3.8–4.8)
Alkaline Phosphatase: 121 [IU]/L (ref 44–121)
BUN/Creatinine Ratio: 12 (ref 10–24)
BUN: 13 mg/dL (ref 8–27)
Bilirubin Total: 0.5 mg/dL (ref 0.0–1.2)
CO2: 21 mmol/L (ref 20–29)
Calcium: 9.8 mg/dL (ref 8.6–10.2)
Chloride: 101 mmol/L (ref 96–106)
Creatinine, Ser: 1.1 mg/dL (ref 0.76–1.27)
Globulin, Total: 2.7 g/dL (ref 1.5–4.5)
Glucose: 95 mg/dL (ref 70–99)
Potassium: 4.2 mmol/L (ref 3.5–5.2)
Sodium: 144 mmol/L (ref 134–144)
Total Protein: 7.1 g/dL (ref 6.0–8.5)
eGFR: 71 mL/min/{1.73_m2} (ref >59)

## 2023-04-04 LAB — CBC WITH DIFFERENTIAL/PLATELET
Basophils Absolute: 0 10*3/uL (ref 0.0–0.2)
Basos: 0 %
EOS (ABSOLUTE): 0.2 10*3/uL (ref 0.0–0.4)
Eos: 2 %
Hematocrit: 47.7 % (ref 37.5–51.0)
Hemoglobin: 15.8 g/dL (ref 13.0–17.7)
Immature Grans (Abs): 0 10*3/uL (ref 0.0–0.1)
Immature Granulocytes: 0 %
Lymphocytes Absolute: 3.1 10*3/uL (ref 0.7–3.1)
Lymphs: 32 %
MCH: 31.2 pg (ref 26.6–33.0)
MCHC: 33.1 g/dL (ref 31.5–35.7)
MCV: 94 fL (ref 79–97)
Monocytes Absolute: 0.8 10*3/uL (ref 0.1–0.9)
Monocytes: 9 %
Neutrophils Absolute: 5.4 10*3/uL (ref 1.4–7.0)
Neutrophils: 57 %
Platelets: 233 10*3/uL (ref 150–450)
RBC: 5.07 x10E6/uL (ref 4.14–5.80)
RDW: 12.2 % (ref 11.6–15.4)
WBC: 9.5 10*3/uL (ref 3.4–10.8)

## 2023-04-04 LAB — TSH: TSH: 1.8 u[IU]/mL (ref 0.450–4.500)

## 2023-04-16 DIAGNOSIS — H47012 Ischemic optic neuropathy, left eye: Secondary | ICD-10-CM | POA: Diagnosis not present

## 2023-05-06 ENCOUNTER — Other Ambulatory Visit: Payer: Self-pay | Admitting: Family Medicine

## 2023-05-06 DIAGNOSIS — I10 Essential (primary) hypertension: Secondary | ICD-10-CM

## 2023-05-06 DIAGNOSIS — I25119 Atherosclerotic heart disease of native coronary artery with unspecified angina pectoris: Secondary | ICD-10-CM

## 2023-05-07 ENCOUNTER — Emergency Department (HOSPITAL_COMMUNITY)
Admission: EM | Admit: 2023-05-07 | Discharge: 2023-05-07 | Disposition: A | Discharge status: Home / Self Care | Payer: PPO | Attending: Emergency Medicine | Admitting: Emergency Medicine

## 2023-05-07 ENCOUNTER — Emergency Department (HOSPITAL_COMMUNITY): Payer: PPO

## 2023-05-07 ENCOUNTER — Other Ambulatory Visit: Payer: Self-pay

## 2023-05-07 ENCOUNTER — Ambulatory Visit: Payer: PPO | Admitting: Nurse Practitioner

## 2023-05-07 ENCOUNTER — Encounter: Payer: Self-pay | Admitting: Nurse Practitioner

## 2023-05-07 VITALS — BP 180/88 | HR 80 | Temp 98.0°F | Ht 70.0 in | Wt 152.0 lb

## 2023-05-07 DIAGNOSIS — Z7982 Long term (current) use of aspirin: Secondary | ICD-10-CM | POA: Diagnosis not present

## 2023-05-07 DIAGNOSIS — J449 Chronic obstructive pulmonary disease, unspecified: Secondary | ICD-10-CM | POA: Diagnosis not present

## 2023-05-07 DIAGNOSIS — F17210 Nicotine dependence, cigarettes, uncomplicated: Secondary | ICD-10-CM | POA: Insufficient documentation

## 2023-05-07 DIAGNOSIS — I2510 Atherosclerotic heart disease of native coronary artery without angina pectoris: Secondary | ICD-10-CM | POA: Diagnosis not present

## 2023-05-07 DIAGNOSIS — Z79899 Other long term (current) drug therapy: Secondary | ICD-10-CM | POA: Diagnosis not present

## 2023-05-07 DIAGNOSIS — E039 Hypothyroidism, unspecified: Secondary | ICD-10-CM | POA: Diagnosis not present

## 2023-05-07 DIAGNOSIS — I6523 Occlusion and stenosis of bilateral carotid arteries: Secondary | ICD-10-CM | POA: Diagnosis not present

## 2023-05-07 DIAGNOSIS — I10 Essential (primary) hypertension: Secondary | ICD-10-CM | POA: Insufficient documentation

## 2023-05-07 DIAGNOSIS — R0789 Other chest pain: Secondary | ICD-10-CM | POA: Diagnosis not present

## 2023-05-07 DIAGNOSIS — R079 Chest pain, unspecified: Secondary | ICD-10-CM

## 2023-05-07 LAB — BASIC METABOLIC PANEL
Anion gap: 15 (ref 5–15)
BUN: 20 mg/dL (ref 8–23)
CO2: 25 mmol/L (ref 22–32)
Calcium: 9.9 mg/dL (ref 8.9–10.3)
Chloride: 103 mmol/L (ref 98–111)
Creatinine, Ser: 1.31 mg/dL — ABNORMAL HIGH (ref 0.61–1.24)
GFR, Estimated: 58 mL/min — ABNORMAL LOW (ref >60)
Glucose, Bld: 108 mg/dL — ABNORMAL HIGH (ref 70–99)
Potassium: 4.4 mmol/L (ref 3.5–5.1)
Sodium: 143 mmol/L (ref 135–145)

## 2023-05-07 LAB — CBC
HCT: 51.1 % (ref 39.0–52.0)
Hemoglobin: 16.9 g/dL (ref 13.0–17.0)
MCH: 30.9 pg (ref 26.0–34.0)
MCHC: 33.1 g/dL (ref 30.0–36.0)
MCV: 93.4 fL (ref 80.0–100.0)
Platelets: 234 10*3/uL (ref 150–400)
RBC: 5.47 MIL/uL (ref 4.22–5.81)
RDW: 12.9 % (ref 11.5–15.5)
WBC: 7.6 10*3/uL (ref 4.0–10.5)
nRBC: 0 % (ref 0.0–0.2)

## 2023-05-07 LAB — TROPONIN I (HIGH SENSITIVITY)
Troponin I (High Sensitivity): 9 ng/L (ref <18)
Troponin I (High Sensitivity): 8 ng/L (ref <18)

## 2023-05-07 MED ORDER — ACETAMINOPHEN 500 MG PO TABS
1000.0000 mg | ORAL_TABLET | Freq: Once | ORAL | Status: AC
  Administered 2023-05-07: 1000 mg via ORAL
  Filled 2023-05-07: qty 2

## 2023-05-07 MED ORDER — AMLODIPINE BESYLATE 5 MG PO TABS: 5.0000 mg | ORAL_TABLET | Freq: Every day | ORAL | 0 refills | Status: DC

## 2023-05-07 MED ORDER — ALUM & MAG HYDROXIDE-SIMETH 200-200-20 MG/5ML PO SUSP
30.0000 mL | Freq: Once | ORAL | Status: AC
  Administered 2023-05-07: 30 mL via ORAL
  Filled 2023-05-07: qty 30

## 2023-05-07 MED ORDER — AMLODIPINE BESYLATE 5 MG PO TABS
5.0000 mg | ORAL_TABLET | Freq: Once | ORAL | Status: AC
  Administered 2023-05-07: 5 mg via ORAL
  Filled 2023-05-07: qty 1

## 2023-05-07 MED ORDER — IPRATROPIUM-ALBUTEROL 0.5-2.5 (3) MG/3ML IN SOLN
3.0000 mL | Freq: Once | RESPIRATORY_TRACT | Status: AC
  Administered 2023-05-07: 3 mL via RESPIRATORY_TRACT
  Filled 2023-05-07: qty 3

## 2023-05-07 NOTE — Progress Notes (Signed)
Established Patient Office Visit  Subjective   Patient ID: [id number], male    DOB: 1950/10/27  Age: 72 y.o. MRN: [id number]  Chief Complaint  Patient presents with   Chest Pain    Chest pain for 2 days feels like pressure    [name] [name] is 72 yrs old male present 05/07/2023 for an acute viist fro chest pain for 2-days- PMH HTN, coronary sclerosis, hyperlipidemia, hypothyrodism, bilateral caratic stenosiss Chest Pain  Associated symptoms include shortness of breath. Pertinent negatives include no cough, dizziness, fever, headaches, nausea or vomiting.   Chest Pain: Patient complains of chest pain. Onset was 2 days ago, with unchanged course since that time. The patient describes the pain as constant, pressure like and sharp in nature, does not radiate. Patient rates pain as a 5/10 in intensity.  Associated symptoms are chest pain, exertional chest pressure/discomfort, and fatigue. Aggravating factors are none.  Alleviating factors are: rest. Patient's cardiac risk factors are advanced age (older than 31 for men, 65 for women), hypertension, male gender, smoking/ tobacco exposure, and hx of bilateral carotid stenosis .  Patient's risk factors for DVT/PE: none. Previous cardiac testing: cholesterol and electrocardiogram (ECG). Client will be transported to the ED for further work up Still smokes 1-pack daily  Patient Active Problem List   Diagnosis Date Noted   Myalgia due to statin 10/31/2022   Bilateral carotid artery stenosis 10/28/2022   Chronic calculous cholecystitis 05/30/2021   GERD (gastroesophageal reflux disease) 04/25/2019   COPD (chronic obstructive pulmonary disease) (HCC) 04/25/2019   Scrotal mass 10/30/2017   Coronary artery disease involving native coronary artery of native heart with angina pectoris (HCC) 08/24/2017   Essential hypertension 08/24/2017   Chronic idiopathic constipation 10/27/2016   Hypothyroidism 03/06/2015   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   HLD  (hyperlipidemia) 04/19/2009   TOBACCO ABUSE 04/19/2009   Coronary atherosclerosis 04/19/2009   Occlusion and stenosis of carotid artery without mention of cerebral infarction 04/19/2009   Past Medical History:  Diagnosis Date   Arthritis    Carotid artery stenosis without cerebral infarction    vascular --- dr Edilia Bo;   09-24-2007  s/p right CEA   Chronic cholecystitis    w/  large calcified stone in gallbladder   Chronic constipation    COPD (chronic obstructive pulmonary disease) (HCC)    followed by pcp----  (05-28-2021  pt stated last used rescue 05-27-2021 for wheezing resolved after use, stated exacerbation approx  greater than a year ago)   Coronary artery disease 2002   cardiologist-- dr Antoine Poche ;  (05-28-2021  pt denies need to use nitro for CP) per last cath 08-04-2017  LM luminal irregularities, LAD 30% ,D1 ostial 35% ,CFx   AV groove ostial 25% , OM1 long prox 30%,  proxRCA occluded and dominant,   excellent left to right collateral filling.   Depression    Patient denies   DOE (dyspnea on exertion)    due to smoking/ copd   Dyslipidemia    Essential hypertension    Full dentures    GERD (gastroesophageal reflux disease)    History of adenomatous polyp of colon    History of COVID-19    per pt winter 2022, mild to moderate symptoms that resolved   History of diverticulitis of colon    History of kidney stones    Hyperlipidemia    Hypothyroidism    followed by pcp   Lung nodule    s/p bronchoscopy w/ bx in 2005,  benign  Osteoporosis    Smokers' cough (HCC)    05-28-2021  pt stated occasionally productive w/ white phlegm   Wears glasses    Past Surgical History:  Procedure Laterality Date   BRONCHOSCOPY  2005   CARDIAC CATHETERIZATION  08/11/2000   @[facility]  by dr Riley Kill;   preserved LVF,  ostial RCA 70-75%, other nonobstructive disease   CARDIAC CATHETERIZATION  03/30/2008   @[facility]  by dr hochrein;  occluded RCA and other nonobstructive involving LAD, CFx, D1,OM1   and left to right collateral filling,  mild inferior hypokinesis,  ef 55%   CAROTID ENDARTERECTOMY Right 09/24/2007   @[facility]  by dr Edilia Bo   COLONOSCOPY  01/19/2017   by armsbruster   EXCISION NEUROMA Left 10/01/2005   @[facility] ;  left inguinal   EXTRACORPOREAL SHOCK WAVE LITHOTRIPSY  2015   INGUINAL HERNIA REPAIR Left    01-13-2003 and 07-22-2006 both @[facility]    LAPAROSCOPIC CHOLECYSTECTOMY SINGLE SITE WITH INTRAOPERATIVE CHOLANGIOGRAM N/A 05/30/2021   Procedure: LAPAROSCOPIC CHOLECYSTECTOMY SINGLE SITE WITH INTRAOPERATIVE CHOLANGIOGRAM;  Surgeon: Karie Soda, MD;  Location: [facility];  Service: General;  Laterality: N/A;   LAPAROSCOPIC INGUINAL HERNIA REPAIR Bilateral 04/28/2007   @[facility]    LEFT HEART CATH AND CORONARY ANGIOGRAPHY N/A 08/04/2017   Procedure: LEFT HEART CATH AND CORONARY ANGIOGRAPHY;  Surgeon: Kathleene Hazel, MD;  Location: [facility];  Service: Cardiovascular;  Laterality: N/A;   THROAT SURGERY  1997   pt stated removal of tumor that was benign   Social History   Tobacco Use   Smoking status: Every Day    Current packs/day: 1.00    Average packs/day: 1 pack/day for 58.0 years (58.0 ttl pk-yrs)    Types: Cigarettes   Smokeless tobacco: Never  Vaping Use   Vaping status: Never Used  Substance Use Topics   Alcohol use: Yes    Alcohol/week: 2.0 standard drinks of alcohol    Types: 2 Standard drinks or equivalent per week   Drug use: No   Social History   Socioeconomic History   Marital status: Married    Spouse name: [name]   Number of children: 4   Years of education: high school diploma   Highest education level: 12th grade  Occupational History   Occupation: truck Air traffic controller: [employer]  Tobacco Use   Smoking status: Every Day    Current packs/day: 1.00    Average packs/day: 1 pack/day for 58.0 years (58.0 ttl pk-yrs)    Types: Cigarettes   Smokeless tobacco: Never  Vaping Use   Vaping status: Never Used   Substance and Sexual Activity   Alcohol use: Yes    Alcohol/week: 2.0 standard drinks of alcohol    Types: 2 Standard drinks or equivalent per week   Drug use: No   Sexual activity: Yes    Birth control/protection: None  Other Topics Concern   Not on file  Social History Narrative   Lives with wife in a split level home.    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   Social Determinants of Health   Financial Resource Strain: Low Risk  (04/01/2023)   Overall Financial Resource Strain (CARDIA)    Difficulty of Paying Living Expenses: Not hard at all  Food Insecurity: No Food Insecurity (04/01/2023)   Hunger Vital Sign  Worried About Programme researcher, broadcasting/film/video in the Last Year: Never true    Ran Out of Food in the Last Year: Never true  Transportation Needs: No Transportation Needs (04/01/2023)   PRAPARE - Administrator, Civil Service (Medical): No    Lack of Transportation (Non-Medical): No  Physical Activity: Sufficiently Active (04/01/2023)   Exercise Vital Sign    Days of Exercise per Week: 5 days    Minutes of Exercise per Session: 30 min  Stress: No Stress Concern Present (04/01/2023)   [name]    Feeling of Stress : Not at all  Social Connections: Socially Integrated (04/01/2023)   Social Connection and Isolation Panel [NHANES]    Frequency of Communication with Friends and Family: More than three times a week    Frequency of Social Gatherings with Friends and Family: More than three times a week    Attends Religious Services: More than 4 times per year    Active Member of [employer] or Organizations: Yes    Attends Engineer, structural: More than 4 times per year    Marital Status: Married  Catering manager Violence: Not At Risk (04/01/2023)    Humiliation, Afraid, Rape, and Kick questionnaire    Fear of Current or Ex-Partner: No    Emotionally Abused: No    Physically Abused: No    Sexually Abused: No   Family Status  Relation Name Status   Mother  Deceased at age 28   Father  Deceased at age 23   Brother  Deceased at age 80   Brother  Alive   Brother  Armed forces training and education officer   Brother  Alive   Daughter  Alive   Son  Alive   Son  Alive   Son  Alive   Neg Hx  (Not Specified)  No partnership data on file   Family History  Problem Relation Age of Onset   Lupus Mother    Lung disease Father    Lupus Brother    Alcohol abuse Brother    Lung disease Brother    Colon polyps Brother    Colon cancer Brother 75   Cancer Brother        colon   Lung disease Brother    Healthy Daughter    Healthy Son    Healthy Son    Healthy Son    Esophageal cancer Neg Hx    Rectal cancer Neg Hx    Stomach cancer Neg Hx    Pancreatic cancer Neg Hx    Crohn's disease Neg Hx    Ulcerative colitis Neg Hx    Allergies  Allergen Reactions   Crestor [Rosuvastatin Calcium] Other (See Comments)    Lethargy and muscle aches   Flomax [Tamsulosin Hcl] Other (See Comments)    Made patient hypotensive, clammy, sweaty, and feel like he was going to pass out (EMS HAD TO BE CALLED)   Lipitor [Atorvastatin Calcium] Other (See Comments)    Body cramps, lethargy and muscle aches      Review of Systems  Constitutional:  Negative for chills and fever.  Respiratory:  Positive for shortness of breath. Negative for cough.   Cardiovascular:  Positive for chest pain.       5/10 chest pain/pressure for 2 days  Gastrointestinal:  Negative for blood in stool, nausea and vomiting.  Musculoskeletal:  Negative for falls.  Skin:  Negative for itching and rash.  Neurological:  Negative for  dizziness and headaches.  Endo/Heme/Allergies:  Negative for environmental allergies and polydipsia. Does not bruise/bleed easily.  Psychiatric/Behavioral:  Negative for suicidal  ideas. The patient does not have insomnia.    Negative unless indicated in HPI   Objective:     BP (!) 180/88   Pulse 80   Temp 98 F (36.7 C) (Temporal)   Ht 5\' 10"  (1.778 m)   Wt 152 lb (68.9 kg)   SpO2 97%   BMI 21.81 kg/m  BP Readings from Last 3 Encounters:  05/07/23 (!) 175/72  05/07/23 (!) 180/88  04/03/23 139/79   Wt Readings from Last 3 Encounters:  05/07/23 152 lb (68.9 kg)  05/07/23 152 lb (68.9 kg)  04/03/23 140 lb (63.5 kg)      Physical Exam Vitals and nursing note reviewed.  Constitutional:      Appearance: He is well-developed.  HENT:     Head: Normocephalic and atraumatic.  Eyes:     General: No scleral icterus.    Extraocular Movements: Extraocular movements intact.     Conjunctiva/sclera: Conjunctivae normal.     Pupils: Pupils are equal, round, and reactive to light.  Cardiovascular:     Rate and Rhythm: Normal rate.  Pulmonary:     Effort: Pulmonary effort is normal.     Breath sounds: Normal breath sounds.  Abdominal:     General: Bowel sounds are normal. There is no distension.     Palpations: Abdomen is soft. There is no mass.     Tenderness: There is no abdominal tenderness. There is no right CVA tenderness, left CVA tenderness, guarding or rebound.     Hernia: No hernia is present.  Musculoskeletal:        General: Normal range of motion.     Cervical back: Normal range of motion and neck supple.     Right lower leg: No edema.     Left lower leg: No edema.  Skin:    General: Skin is warm and dry.     Findings: No rash.  Neurological:     Mental Status: He is alert and oriented to person, place, and time. Mental status is at baseline.  Psychiatric:        Mood and Affect: Mood normal.        Behavior: Behavior normal.        Thought Content: Thought content normal.        Judgment: Judgment normal.     Results for orders placed or performed during the hospital encounter of 05/07/23  Basic metabolic panel  Result Value Ref  Range   Sodium 143 135 - 145 mmol/L   Potassium 4.4 3.5 - 5.1 mmol/L   Chloride 103 98 - 111 mmol/L   CO2 25 22 - 32 mmol/L   Glucose, Bld 108 (H) 70 - 99 mg/dL   BUN 20 8 - 23 mg/dL   Creatinine, Ser 5.62 (H) 0.61 - 1.24 mg/dL   Calcium 9.9 8.9 - 13.0 mg/dL   GFR, Estimated 58 (L) >60 mL/min   Anion gap 15 5 - 15  CBC  Result Value Ref Range   WBC 7.6 4.0 - 10.5 K/uL   RBC 5.47 4.22 - 5.81 MIL/uL   Hemoglobin 16.9 13.0 - 17.0 g/dL   HCT 86.5 78.4 - 69.6 %   MCV 93.4 80.0 - 100.0 fL   MCH 30.9 26.0 - 34.0 pg   MCHC 33.1 30.0 - 36.0 g/dL   RDW 29.5 28.4 - 13.2 %  Platelets 234 150 - 400 K/uL   nRBC 0.0 0.0 - 0.2 %  Troponin I (High Sensitivity)  Result Value Ref Range   Troponin I (High Sensitivity) 9 <18 ng/L  Troponin I (High Sensitivity)  Result Value Ref Range   Troponin I (High Sensitivity) 8 <18 ng/L    Last CBC Lab Results  Component Value Date   WBC 7.6 05/07/2023   HGB 16.9 05/07/2023   HCT 51.1 05/07/2023   MCV 93.4 05/07/2023   MCH 30.9 05/07/2023   RDW 12.9 05/07/2023   PLT 234 05/07/2023   Last metabolic panel Lab Results  Component Value Date   GLUCOSE 108 (H) 05/07/2023   NA 143 05/07/2023   K 4.4 05/07/2023   CL 103 05/07/2023   CO2 25 05/07/2023   BUN 20 05/07/2023   CREATININE 1.31 (H) 05/07/2023   EGFR 71 04/03/2023   CALCIUM 9.9 05/07/2023   PROT 7.1 04/03/2023   ALBUMIN 4.4 04/03/2023   LABGLOB 2.7 04/03/2023   AGRATIO 1.4 02/06/2022   BILITOT 0.5 04/03/2023   ALKPHOS 121 04/03/2023   AST 17 04/03/2023   ALT 9 04/03/2023   ANIONGAP 15 05/07/2023   Last lipids Lab Results  Component Value Date   CHOL 129 04/03/2023   HDL 45 04/03/2023   LDLCALC 65 04/03/2023   TRIG 99 04/03/2023   CHOLHDL 2.9 04/03/2023   Last hemoglobin A1c Lab Results  Component Value Date   HGBA1C 5.9 (H) 07/13/2017   Last thyroid functions Lab Results  Component Value Date   TSH 1.800 04/03/2023   T3TOTAL 117 08/06/2017   T4TOTAL 6.9 03/06/2014         Assessment & Plan:  Chest pain, unspecified type -     EKG 12-Lead  Bilateral carotid artery stenosis -     EKG 12-Lead   Snapper is a 72 year old Caucasian male seen for active chest pain Chest pain: EKG Cervical client extensive cardiac, issues, he was transferred to [facility] via EMS Return for Posthospital discharge.    Arrie Aran Santa Lighter, DNP [facility] [address] [place], [place] [address] [phone]

## 2023-05-07 NOTE — ED Triage Notes (Signed)
Pt having chest pain for two days and was seen at [facility] today. Primary advised pt to go to ER to be checked out due to cardiac history of blockage. Pt states he has "small blockage" somewhere in heart. Pt states he is not having pain today.

## 2023-05-07 NOTE — Discharge Instructions (Signed)
It was a pleasure caring for you today.  As discussed, you will need to follow-up with your cardiologist in the next upcoming week.  Please seek emergency care if experiencing any new or worsening symptoms.

## 2023-05-07 NOTE — ED Provider Notes (Incomplete)
[place] EMERGENCY DEPARTMENT AT [facility] Provider Note   CSN: [id number] Arrival date & time: 05/07/23  9528     History {Add pertinent medical, surgical, social history, OB history to HPI:1} No chief complaint on file.   DONAT GAMLIN is a 72 y.o. male with PMHx CAD, COPD, HLD, HTN, GERD, hypothyroidism, smokers cough who presents to the ED concerned for constant chest pain x2 days. Patient went to PCP today who referred him to ED. Patient also endorses intermittent SOB that is not associated with exertion vs rest. Patient endorses compliance on COPD medications.  Denies fever, cough, nausea, vomiting, diarrhea.   HPI     Home Medications Prior to Admission medications   Medication Sig Start Date End Date Taking? Authorizing Provider  albuterol (VENTOLIN HFA) 108 (90 Base) MCG/ACT inhaler INHALE TWO PUFFS EVERY 6 HOURS AS NEEDED 12/19/22   Dettinger, Elige Radon, MD  amLODipine (NORVASC) 5 MG tablet Take 1 tablet (5 mg total) by mouth daily. 10/29/22   Rollene Rotunda, MD  aspirin EC 81 MG tablet Take 1 tablet (81 mg total) by mouth daily. Swallow whole. 07/03/21   Cannon Kettle, PA-C  Bempedoic Acid-Ezetimibe (NEXLIZET) 180-10 MG TABS Take 1 tablet by mouth daily. 04/03/23   Dettinger, Elige Radon, MD  Budeson-Glycopyrrol-Formoterol (BREZTRI AEROSPHERE) 160-9-4.8 MCG/ACT AERO Inhale 2 puffs into the lungs 2 (two) times daily. 04/03/23   Dettinger, Elige Radon, MD  finasteride (PROSCAR) 5 MG tablet Take 1 tablet (5 mg total) by mouth daily. 08/25/22   Dettinger, Elige Radon, MD  levothyroxine (SYNTHROID) 50 MCG tablet Take 1 tablet (50 mcg total) by mouth daily. 04/03/23   Dettinger, Elige Radon, MD  lisinopril (ZESTRIL) 20 MG tablet TAKE ONE TABLET DAILY 05/06/23   Dettinger, Elige Radon, MD  nitroGLYCERIN (NITROSTAT) 0.4 MG SL tablet Place 1 tablet (0.4 mg total) under the tongue every 5 (five) minutes as needed for chest pain. 09/03/20 04/15/22  Dettinger, Elige Radon, MD   pantoprazole (PROTONIX) 20 MG tablet Take 1 tablet (20 mg total) by mouth daily. 04/03/23   Dettinger, Elige Radon, MD      Allergies    Crestor [rosuvastatin calcium], Flomax [tamsulosin hcl], and Lipitor [atorvastatin calcium]    Review of Systems   Review of Systems  Cardiovascular:  Positive for chest pain.    Physical Exam Updated Vital Signs There were no vitals taken for this visit. Physical Exam Vitals and nursing note reviewed.  Constitutional:      General: He is not in acute distress.    Appearance: He is not ill-appearing, toxic-appearing or diaphoretic.  HENT:     Head: Normocephalic and atraumatic.     Mouth/Throat:     Mouth: Mucous membranes are moist.     Pharynx: No oropharyngeal exudate or posterior oropharyngeal erythema.  Eyes:     General: No scleral icterus.       Right eye: No discharge.        Left eye: No discharge.     Conjunctiva/sclera: Conjunctivae normal.  Cardiovascular:     Rate and Rhythm: Normal rate and regular rhythm.     Pulses: Normal pulses.          Radial pulses are 2+ on the right side and 2+ on the left side.     Heart sounds: Normal heart sounds. No murmur heard. Pulmonary:     Effort: Pulmonary effort is normal. No respiratory distress.     Breath sounds: Examination of the right-lower  field reveals decreased breath sounds. Examination of the left-lower field reveals decreased breath sounds. Decreased breath sounds present. No wheezing, rhonchi or rales.  Abdominal:     Tenderness: There is no abdominal tenderness.  Musculoskeletal:     Right lower leg: No edema.     Left lower leg: No edema.  Skin:    General: Skin is warm and dry.     Findings: No rash.  Neurological:     General: No focal deficit present.     Mental Status: He is alert. Mental status is at baseline.  Psychiatric:        Mood and Affect: Mood normal.        Behavior: Behavior normal.     ED Results / Procedures / Treatments   Labs (all labs ordered  are listed, but only abnormal results are displayed) Labs Reviewed - No data to display  EKG None  Radiology No results found.  Procedures Procedures  {Document cardiac monitor, telemetry assessment procedure when appropriate:1}  Medications Ordered in ED Medications - No data to display  ED Course/ Medical Decision Making/ A&P   {   Click here for ABCD2, HEART and other calculatorsREFRESH Note before signing :1}                              Medical Decision Making Amount and/or Complexity of Data Reviewed Labs: ordered. Radiology: ordered.  Risk OTC drugs. Prescription drug management.   This patient presents to the ED for concern of chest pain, this involves an extensive number of treatment options, and is a complaint that carries with it a high risk of complications and morbidity.  The differential diagnosis includes acute coronary syndrome, congestive heart failure, pericarditis, pneumonia, pulmonary embolism, tension pneumothorax, esophageal rupture, aortic dissection, cardiac tamponade, musculoskeletal   Co morbidities that complicate the patient evaluation  ***   Additional history obtained:  Dr. Antoine Poche Cardiologist   Lab Tests:  I Ordered, and personally interpreted labs.  The pertinent results include:  - Troponin: within normal limits - BMP: mild elevation of Cr at 1.31 - CBC: No concern for anemia or leukocytosis   Imaging Studies ordered:  I ordered imaging studies including  -chest xray: to assess for process contributing to patient's symptoms.  I independently visualized and interpreted imaging  I agree with the radiologist interpretation   Consultations Obtained:  I requested consultation with Cardiology Dr. Diona Browner,  and discussed lab and imaging findings as well as pertinent plan - they recommend: starting patient on Norvasc for uncontrolled HTN and following up with outpatient Cardiology.   Problem List / ED Course / Critical  interventions / Medication management  PMHx CAD with occluded RCA treated medically, HTN, COPD. Compliant on medications. Presents to ED concern for constant chest pain x 2 days. Also has some intermittent SOB that is not related to positioning vs exertion vs rest. Physical exam with distant breath sounds in lower lung fields that resolved after Duoneb. Patient ambulated in ED with O2 99-100% on RA.  Rest of physical exam unremarkable. Initial troponin 9 which downtrended to 8.  CBC without leukocytosis or anemia.  BMP with mild elevation of creatinine at 1.31.  EKG appears reassuring.  Chest x-ray without acute cardiopulmonary disease.Marland Kitchen Consulted with Dr. Diona Browner who recommended starting patient on Norvasc and following up with outpatient cardiologist.   Educated patient that if he is having swelling with 5 mg of Norvasc then he  should decrease his dose to 2.5. Also educated patient that he will need to follow-up with his cardiologist within this next week.  Patient verbalized understanding of plan. I have reviewed the patients home medicines and have made adjustments as needed Patient was given return precautions. Patient stable for discharge at this time.  Patient verbalized understanding of plan.  Ddx:  These are considered less likely due to history of present illness and physical exam findings.  -Acute coronary syndrome: EKG and troponins within normal limits  -Congestive heart failure: patient denies orthopnea, cough, and leg edema -Pericarditis: pain is not positional and patient denies orthopnea and recent illness -Pneumonia: lungs are clear to auscultation bilaterally -Pulmonary embolism: patient ambulated in ED with O2 99% on RA; no tachycardia in ED. -Pneumothorax: lungs are clear to auscultation bilaterally -Esophageal rupture: patient denies vomiting, heavy drinking, and hx of GERD -Aortic dissection: vital signs are stable, no variation in pulse pressure -Cardiac tamponade: absence  of hypotension, JVD, and muffled heart sounds   Social Determinants of Health:  none    {Document critical care time when appropriate:1} {Document review of labs and clinical decision tools ie heart score, Chads2Vasc2 etc:1}  {Document your independent review of radiology images, and any outside records:1} {Document your discussion with family members, caretakers, and with consultants:1} {Document social determinants of health affecting pt's care:1} {Document your decision making why or why not admission, treatments were needed:1} Final Clinical Impression(s) / ED Diagnoses Final diagnoses:  None    Rx / DC Orders ED Discharge Orders     None

## 2023-05-07 NOTE — ED Notes (Signed)
Pt ambulated in hall approximately 50 feet. Oxygen maintained at 99-100% on room air.

## 2023-05-08 NOTE — ED Provider Notes (Signed)
[place] EMERGENCY DEPARTMENT AT [facility] Provider Note   CSN: [id number] Arrival date & time: 05/07/23  0915     History  Chief Complaint  Patient presents with   Chest Pain    [name] [name] is a 72 y.o. male with PMHx CAD, COPD, HLD, HTN, GERD, hypothyroidism, smokers cough who presents to the ED concerned for constant chest pain x2 days. Patient went to PCP today who referred him to ED. Patient also endorses intermittent SOB that is not associated with exertion vs rest vs positioning. Patient endorses compliance on COPD and CAD medications.  Denies fever, cough, nausea, vomiting, diarrhea.   HPI     Home Medications Prior to Admission medications   Medication Sig Start Date End Date Taking? Authorizing Provider  albuterol (VENTOLIN HFA) 108 (90 Base) MCG/ACT inhaler INHALE TWO PUFFS EVERY 6 HOURS AS NEEDED Patient taking differently: Inhale 2 puffs into the lungs every 6 (six) hours as needed for wheezing or shortness of breath. 12/19/22  Yes Dettinger, Elige Radon, MD  amLODipine (NORVASC) 5 MG tablet Take 1 tablet (5 mg total) by mouth daily. 05/07/23  Yes Valrie Hart F, PA-C  aspirin EC 81 MG tablet Take 1 tablet (81 mg total) by mouth daily. Swallow whole. 07/03/21  Yes Juanda Crumble K, PA-C  Bempedoic Acid-Ezetimibe (NEXLIZET) 180-10 MG TABS Take 1 tablet by mouth daily. 04/03/23  Yes Dettinger, Elige Radon, MD  Budeson-Glycopyrrol-Formoterol (BREZTRI AEROSPHERE) 160-9-4.8 MCG/ACT AERO Inhale 2 puffs into the lungs 2 (two) times daily. Patient taking differently: Inhale 2 puffs into the lungs every evening. 04/03/23  Yes Dettinger, Elige Radon, MD  levothyroxine (SYNTHROID) 50 MCG tablet Take 1 tablet (50 mcg total) by mouth daily. 04/03/23  Yes Dettinger, Elige Radon, MD  lisinopril (ZESTRIL) 20 MG tablet TAKE ONE TABLET DAILY 05/06/23  Yes Dettinger, Elige Radon, MD  omeprazole (PRILOSEC) 20 MG capsule Take 1 capsule by mouth daily.   Yes [provider]  pantoprazole (PROTONIX) 20 MG tablet Take 1 tablet (20 mg total) by mouth daily. 04/03/23  Yes Dettinger, Elige Radon, MD  TRELEGY ELLIPTA 100-62.5-25 MCG/ACT AEPB Inhale 1 puff into the lungs in the morning. 04/28/23  Yes [provider]  nitroGLYCERIN (NITROSTAT) 0.4 MG SL tablet Place 1 tablet (0.4 mg total) under the tongue every 5 (five) minutes as needed for chest pain. Patient not taking: Reported on 05/07/2023 09/03/20 04/15/22  Dettinger, Elige Radon, MD      Allergies    Crestor [rosuvastatin calcium], Flomax [tamsulosin hcl], and Lipitor [atorvastatin calcium]    Review of Systems   Review of Systems  Cardiovascular:  Positive for chest pain.    Physical Exam Updated Vital Signs BP (!) 162/82   Pulse 61   Temp 97.6 F (36.4 C) (Oral)   Resp 15   Ht 5\' 10"  (1.778 m)   Wt 68.9 kg   SpO2 96%   BMI 21.81 kg/m  Physical Exam Vitals and nursing note reviewed.  Constitutional:      General: He is not in acute distress.    Appearance: He is not ill-appearing, toxic-appearing or diaphoretic.  HENT:     Head: Normocephalic and atraumatic.     Mouth/Throat:     Mouth: Mucous membranes are moist.     Pharynx: No oropharyngeal exudate or posterior oropharyngeal erythema.  Eyes:     General: No scleral icterus.       Right eye: No discharge.  Left eye: No discharge.     Conjunctiva/sclera: Conjunctivae normal.  Cardiovascular:     Rate and Rhythm: Normal rate and regular rhythm.     Pulses: Normal pulses.          Radial pulses are 2+ on the right side and 2+ on the left side.     Heart sounds: Normal heart sounds. No murmur heard. Pulmonary:     Effort: Pulmonary effort is normal. No respiratory distress.     Breath sounds: Examination of the right-lower field reveals decreased breath sounds. Examination of the left-lower field reveals decreased breath sounds. Decreased breath sounds present. No wheezing, rhonchi or rales.  Abdominal:      Tenderness: There is no abdominal tenderness.  Musculoskeletal:     Right lower leg: No edema.     Left lower leg: No edema.  Skin:    General: Skin is warm and dry.     Findings: No rash.  Neurological:     General: No focal deficit present.     Mental Status: He is alert. Mental status is at baseline.  Psychiatric:        Mood and Affect: Mood normal.        Behavior: Behavior normal.     ED Results / Procedures / Treatments   Labs (all labs ordered are listed, but only abnormal results are displayed) Labs Reviewed  BASIC METABOLIC PANEL - Abnormal; Notable for the following components:      Result Value   Glucose, Bld 108 (*)    Creatinine, Ser 1.31 (*)    GFR, Estimated 58 (*)    All other components within normal limits  CBC  TROPONIN I (HIGH SENSITIVITY)  TROPONIN I (HIGH SENSITIVITY)    EKG None  Radiology DG Chest 2 View  Result Date: 05/07/2023 CLINICAL DATA:  Chest pain. EXAM: CHEST - 2 VIEW COMPARISON:  04/29/2019. FINDINGS: Bilateral lungs appear hyperexpanded and hyperlucent with coarse bronchovascular markings, in keeping with COPD. Bilateral lungs otherwise appear clear. No dense consolidation. Bilateral costophrenic angles are clear. Normal cardio-mediastinal silhouette. No acute osseous abnormalities. The soft tissues are within normal limits. IMPRESSION: *COPD.  No active cardiopulmonary disease. Electronically Signed   By: Jules Schick M.D.   On: 05/07/2023 12:23    Procedures Procedures    Medications Ordered in ED Medications  alum & mag hydroxide-simeth (MAALOX/MYLANTA) 200-200-20 MG/5ML suspension 30 mL (30 mLs Oral Given 05/07/23 1104)  acetaminophen (TYLENOL) tablet 1,000 mg (1,000 mg Oral Given 05/07/23 1104)  ipratropium-albuterol (DUONEB) 0.5-2.5 (3) MG/3ML nebulizer solution 3 mL (3 mLs Nebulization Given 05/07/23 1105)  amLODipine (NORVASC) tablet 5 mg (5 mg Oral Given 05/07/23 1251)    ED Course/ Medical Decision Making/ A&P                                 Medical Decision Making Amount and/or Complexity of Data Reviewed Labs: ordered. Radiology: ordered.  Risk OTC drugs. Prescription drug management.   This patient presents to the ED for concern of chest pain, this involves an extensive number of treatment options, and is a complaint that carries with it a high risk of complications and morbidity.  The differential diagnosis includes acute coronary syndrome, congestive heart failure, pericarditis, pneumonia, pulmonary embolism, tension pneumothorax, esophageal rupture, aortic dissection, cardiac tamponade, musculoskeletal   Co morbidities that complicate the patient evaluation  CAD, COPD, HLD, HTN, GERD, hypothyroidism, smokers cough  Additional history obtained:  Dr. Antoine Poche Cardiologist   Lab Tests:  I Ordered, and personally interpreted labs.  The pertinent results include:  - Troponin: within normal limits - BMP: mild elevation of Cr at 1.31 - CBC: No concern for anemia or leukocytosis   Imaging Studies ordered:  I ordered imaging studies including  -chest xray: to assess for process contributing to patient's symptoms.  I independently visualized and interpreted imaging  I agree with the radiologist interpretation   Consultations Obtained:  I requested consultation with Cardiology Dr. Diona Browner,  and discussed lab and imaging findings as well as pertinent plan - they recommend: starting patient on Norvasc for uncontrolled HTN and following up with outpatient Cardiology.   Problem List / ED Course / Critical interventions / Medication management  PMHx CAD with occluded RCA treated medically, HTN, COPD. Compliant on medications. Presents to ED concern for constant chest pain x 2 days. Also has some intermittent SOB that is not related to positioning vs exertion vs rest. Denies any other infectious symptoms. Physical exam with some distant breath sounds in lower lung fields that resolved  after Duoneb. Patient ambulated in ED with O2 99-100% on RA.  Rest of physical exam unremarkable. Initial troponin 9 which downtrended to 8.  CBC without leukocytosis or anemia.  BMP with mild elevation of creatinine at 1.31.  EKG appears reassuring.  Chest x-ray without acute cardiopulmonary disease. Consulted with Dr. Diona Browner who recommended starting patient on Norvasc and following up with outpatient cardiologist.  Dr. Diona Browner stating that the Norvasc may help with patient's chest pain and his currently uncontrolled HTN. Educated patient that if he is having swelling with 5 mg of Norvasc then he should decrease his dose to 2.5. Also educated patient that he will need to follow-up with his cardiologist within this next week. Also recommended patient follow up with PCP to possibly adjust his daily COPD medications given his intermittent SOB. Patient verbalized understanding of plan. Staffed patient with Dr. Estell Harpin. I have reviewed the patients home medicines and have made adjustments as needed Patient was given return precautions. Patient stable for discharge at this time.  Patient verbalized understanding of plan.  Ddx:  These are considered less likely due to history of present illness and physical exam findings.  -Acute coronary syndrome: EKG and troponins within normal limits  -Congestive heart failure: patient denies orthopnea, cough, and leg edema -Pericarditis: pain is not positional and patient denies orthopnea and recent illness -Pneumonia: lungs are clear to auscultation bilaterally -Pulmonary embolism: patient ambulated in ED with O2 99% on RA; no tachycardia in ED. -Pneumothorax: lungs are clear to auscultation bilaterally -Esophageal rupture: patient denies vomiting, heavy drinking, and hx of GERD -Aortic dissection: vital signs are stable, no variation in pulse pressure -Cardiac tamponade: absence of hypotension, JVD, and muffled heart sounds   Social Determinants of  Health:  none          Final Clinical Impression(s) / ED Diagnoses Final diagnoses:  Nonspecific chest pain    Rx / DC Orders ED Discharge Orders          Ordered    amLODipine (NORVASC) 5 MG tablet  Daily        05/07/23 1234              Dorthy Cooler, [place] 05/08/23 1100    Bethann Berkshire, MD 05/08/23 1825

## 2023-05-12 ENCOUNTER — Other Ambulatory Visit: Payer: Self-pay | Admitting: Acute Care

## 2023-05-12 DIAGNOSIS — Z87891 Personal history of nicotine dependence: Secondary | ICD-10-CM

## 2023-05-12 DIAGNOSIS — Z122 Encounter for screening for malignant neoplasm of respiratory organs: Secondary | ICD-10-CM

## 2023-05-12 DIAGNOSIS — F17210 Nicotine dependence, cigarettes, uncomplicated: Secondary | ICD-10-CM

## 2023-06-01 ENCOUNTER — Telehealth: Payer: Self-pay | Admitting: *Deleted

## 2023-06-01 NOTE — Telephone Encounter (Signed)
Transition Care Management Follow-up Telephone Call Date of discharge and from where: [facility]  05/07/2023 How have you been since you were released from the hospital? Feeling very good now BP is 137/72  Any questions or concerns? No  Items Reviewed: Did the pt receive and understand the discharge instructions provided? Yes  Medications obtained and verified? Yes  Other? No  Any new allergies since your discharge? No  Dietary orders reviewed? No Do you have support at home? Yes       Follow up appointments reviewed:  PCP Hospital f/u appt confirmed? Yes  Has seen PCP  Specialist Hospital f/u appt confirmed? No  Are transportation arrangements needed? No  Ok on medications  If their condition worsens, is the pt aware to call PCP or go to the Emergency Dept.? Yes Was the patient provided with contact information for the PCP's office or ED? Yes Was to pt encouraged to call back with questions or concerns? Yes     Dione Booze [facility]  Population Health Careguide  Direct Dial: [phone] Website: [email or web address]

## 2023-06-18 ENCOUNTER — Encounter: Payer: Self-pay | Admitting: Family Medicine

## 2023-06-18 ENCOUNTER — Ambulatory Visit: Payer: PPO | Admitting: Family Medicine

## 2023-06-18 VITALS — BP 148/78 | HR 89 | Temp 99.1°F | Ht 70.0 in | Wt 153.0 lb

## 2023-06-18 DIAGNOSIS — R051 Acute cough: Secondary | ICD-10-CM | POA: Diagnosis not present

## 2023-06-18 DIAGNOSIS — J441 Chronic obstructive pulmonary disease with (acute) exacerbation: Secondary | ICD-10-CM

## 2023-06-18 LAB — VERITOR FLU A/B WAIVED
Influenza A: NEGATIVE
Influenza B: NEGATIVE

## 2023-06-18 LAB — RSV AG, IMMUNOCHR, WAIVED: RSV Ag, Immunochr, Waived: NEGATIVE

## 2023-06-18 MED ORDER — CEFDINIR 300 MG PO CAPS: 300.0000 mg | ORAL_CAPSULE | Freq: Two times a day (BID) | ORAL | 0 refills | Status: DC

## 2023-06-18 MED ORDER — PREDNISONE 20 MG PO TABS: 40.0000 mg | ORAL_TABLET | Freq: Every day | ORAL | 0 refills | Status: AC

## 2023-06-18 NOTE — Progress Notes (Signed)
Subjective:  Patient ID: [name] [name], male    DOB: 05/09/1951, 73 y.o.   MRN: [id number]  Patient Care Team: Dettinger, Elige Radon, MD as PCP - General (Family Medicine) Rollene Rotunda, MD as PCP - Cardiology (Cardiology) Rollene Rotunda, MD as Consulting Physician (Cardiology) Danella Maiers, [facility] (Pharmacist) Karie Soda, MD as Consulting Physician (General Surgery) Armbruster, Willaim Rayas, MD as Consulting Physician (Gastroenterology)   Chief Complaint:  cough, congestion, headache, fatigue (X 2 days)  HPI: [name] [name] is a 72 y.o. male presenting on 06/18/2023 for cough, congestion, headache, fatigue (X 2 days)  HPI 1. Acute cough Symptoms started 2 days ago. States that he is having cough, watery eyes, headaches, aching, especially in his legs. States that he is having some trouble breathing. Is taking ibuprofen OTC for headache. Has not taken any today. Endorses increased sputum production. States that it is clear. Has history of COPD. States that it is not thicker than normal. Using Trelegy, not Breztri. Using albuterol every now and then. States that he has not used it in 1.5 months. Continues to smoke at least one PPD. Denies fever. Endorses sinus pressure.    Relevant past medical, surgical, family, and social history reviewed and updated as indicated.  Allergies and medications reviewed and updated. Data reviewed: Chart in Epic.   Past Medical History:  Diagnosis Date   Arthritis    Carotid artery stenosis without cerebral infarction    vascular --- dr Edilia Bo;   09-24-2007  s/p right CEA   Chronic cholecystitis    w/  large calcified stone in gallbladder   Chronic constipation    COPD (chronic obstructive pulmonary disease) (HCC)    followed by pcp----  (05-28-2021  pt stated last used rescue 05-27-2021 for wheezing resolved after use, stated exacerbation approx  greater than a year ago)   Coronary artery disease 2002   cardiologist-- dr Antoine Poche ;   (05-28-2021  pt denies need to use nitro for CP) per last cath 08-04-2017  LM luminal irregularities, LAD 30% ,D1 ostial 35% ,CFx   AV groove ostial 25% , OM1 long prox 30%,  proxRCA occluded and dominant,   excellent left to right collateral filling.   Depression    Patient denies   DOE (dyspnea on exertion)    due to smoking/ copd   Dyslipidemia    Essential hypertension    Full dentures    GERD (gastroesophageal reflux disease)    History of adenomatous polyp of colon    History of COVID-19    per pt winter 2022, mild to moderate symptoms that resolved   History of diverticulitis of colon    History of kidney stones    Hyperlipidemia    Hypothyroidism    followed by pcp   Lung nodule    s/p bronchoscopy w/ bx in 2005,  benign   Osteoporosis    Smokers' cough (HCC)    05-28-2021  pt stated occasionally productive w/ white phlegm   Wears glasses     Past Surgical History:  Procedure Laterality Date   BRONCHOSCOPY  2005   CARDIAC CATHETERIZATION  08/11/2000   @[facility]  by dr Riley Kill;   preserved LVF,  ostial RCA 70-75%, other nonobstructive disease   CARDIAC CATHETERIZATION  03/30/2008   @[facility]  by dr hochrein;  occluded RCA and other nonobstructive involving LAD, CFx, D1,OM1  and left to right collateral filling,  mild inferior hypokinesis,  ef 55%   CAROTID ENDARTERECTOMY Right 09/24/2007   @  [facility] by dr Edilia Bo   COLONOSCOPY  01/19/2017   by armsbruster   EXCISION NEUROMA Left 10/01/2005   @[facility] ;  left inguinal   EXTRACORPOREAL SHOCK WAVE LITHOTRIPSY  2015   INGUINAL HERNIA REPAIR Left    01-13-2003 and 07-22-2006 both @[facility]    LAPAROSCOPIC CHOLECYSTECTOMY SINGLE SITE WITH INTRAOPERATIVE CHOLANGIOGRAM N/A 05/30/2021   Procedure: LAPAROSCOPIC CHOLECYSTECTOMY SINGLE SITE WITH INTRAOPERATIVE CHOLANGIOGRAM;  Surgeon: Karie Soda, MD;  Location: [facility] [place];  Service: General;  Laterality: N/A;   LAPAROSCOPIC INGUINAL HERNIA REPAIR Bilateral 04/28/2007   @[facility]    LEFT HEART  CATH AND CORONARY ANGIOGRAPHY N/A 08/04/2017   Procedure: LEFT HEART CATH AND CORONARY ANGIOGRAPHY;  Surgeon: Kathleene Hazel, MD;  Location: [facility];  Service: Cardiovascular;  Laterality: N/A;   THROAT SURGERY  1997   pt stated removal of tumor that was benign    Social History   Socioeconomic History   Marital status: Married    Spouse name: [name] [name]   Number of children: 4   Years of education: high school diploma   Highest education level: 12th grade  Occupational History   Occupation: truck Air traffic controller: [employer]  Tobacco Use   Smoking status: Every Day    Current packs/day: 1.00    Average packs/day: 1 pack/day for 58.0 years (58.0 ttl pk-yrs)    Types: Cigarettes   Smokeless tobacco: Never  Vaping Use   Vaping status: Never Used  Substance and Sexual Activity   Alcohol use: Yes    Alcohol/week: 2.0 standard drinks of alcohol    Types: 2 Standard drinks or equivalent per week   Drug use: No   Sexual activity: Yes    Birth control/protection: None  Other Topics Concern   Not on file  Social History Narrative   Lives with wife in a split level home.    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   Social Drivers of Corporate investment banker Strain: Low Risk  (04/01/2023)   Overall Financial Resource Strain (CARDIA)    Difficulty of Paying Living Expenses: Not hard at all  Food Insecurity: No Food Insecurity (04/01/2023)   Hunger Vital Sign    Worried About Running Out of Food in the Last Year: Never true    Ran Out of Food in the Last Year: Never true  Transportation Needs: No Transportation Needs (04/01/2023)   PRAPARE - Administrator, Civil Service (Medical): No    Lack of Transportation (Non-Medical): No  Physical Activity: Sufficiently Active (04/01/2023)   Exercise  Vital Sign    Days of Exercise per Week: 5 days    Minutes of Exercise per Session: 30 min  Stress: No Stress Concern Present (04/01/2023)   [name]    Feeling of Stress : Not at all  Social Connections: Socially Integrated (04/01/2023)   Social Connection and Isolation Panel [NHANES]    Frequency of Communication with Friends and Family: More than three times a week    Frequency of Social Gatherings with Friends and Family: More than three times a week    Attends Religious Services: More than 4 times per year    Active Member of [employer] or Organizations: Yes    Attends Club or  Organization Meetings: More than 4 times per year    Marital Status: Married  Catering manager Violence: Not At Risk (04/01/2023)   Humiliation, Afraid, Rape, and Kick questionnaire    Fear of Current or Ex-Partner: No    Emotionally Abused: No    Physically Abused: No    Sexually Abused: No    Outpatient Encounter Medications as of 06/18/2023  Medication Sig   amLODipine (NORVASC) 5 MG tablet Take 1 tablet (5 mg total) by mouth daily.   aspirin EC 81 MG tablet Take 1 tablet (81 mg total) by mouth daily. Swallow whole.   Bempedoic Acid-Ezetimibe (NEXLIZET) 180-10 MG TABS Take 1 tablet by mouth daily.   levothyroxine (SYNTHROID) 50 MCG tablet Take 1 tablet (50 mcg total) by mouth daily.   lisinopril (ZESTRIL) 20 MG tablet TAKE ONE TABLET DAILY   omeprazole (PRILOSEC) 20 MG capsule Take 1 capsule by mouth daily.   TRELEGY ELLIPTA 100-62.5-25 MCG/ACT AEPB Inhale 1 puff into the lungs in the morning.   [DISCONTINUED] Budeson-Glycopyrrol-Formoterol (BREZTRI AEROSPHERE) 160-9-4.8 MCG/ACT AERO Inhale 2 puffs into the lungs 2 (two) times daily. (Patient taking differently: Inhale 2 puffs into the lungs every evening.)   [DISCONTINUED] pantoprazole (PROTONIX) 20 MG tablet Take 1 tablet (20 mg total) by mouth daily.   albuterol (VENTOLIN HFA) 108 (90 Base)  MCG/ACT inhaler INHALE TWO PUFFS EVERY 6 HOURS AS NEEDED (Patient not taking: Reported on 06/18/2023)   [DISCONTINUED] nitroGLYCERIN (NITROSTAT) 0.4 MG SL tablet Place 1 tablet (0.4 mg total) under the tongue every 5 (five) minutes as needed for chest pain. (Patient not taking: Reported on 05/07/2023)   [DISCONTINUED] 0.9 %  sodium chloride infusion    No facility-administered encounter medications on file as of 06/18/2023.    Allergies  Allergen Reactions   Crestor [Rosuvastatin Calcium] Other (See Comments)    Lethargy and muscle aches   Flomax [Tamsulosin Hcl] Other (See Comments)    Made patient hypotensive, clammy, sweaty, and feel like he was going to pass out (EMS HAD TO BE CALLED)   Lipitor [Atorvastatin Calcium] Other (See Comments)    Body cramps, lethargy and muscle aches    Review of Systems As per HPI  Objective:  BP (!) 148/78   Pulse 89   Temp 99.1 F (37.3 C)   Ht 5\' 10"  (1.778 m)   Wt 153 lb (69.4 kg)   SpO2 95%   BMI 21.95 kg/m    Wt Readings from Last 3 Encounters:  06/18/23 153 lb (69.4 kg)  05/07/23 152 lb (68.9 kg)  05/07/23 152 lb (68.9 kg)   Physical Exam Constitutional:      General: He is awake. He is not in acute distress.    Appearance: Normal appearance. He is well-developed and well-groomed. He is not ill-appearing, toxic-appearing or diaphoretic.  HENT:     Right Ear: A middle ear effusion is present. Tympanic membrane is not injected, scarred, perforated, erythematous, retracted or bulging.     Left Ear: A middle ear effusion is present. Tympanic membrane is not injected, scarred, perforated, erythematous, retracted or bulging.     Nose: Congestion and rhinorrhea present. Rhinorrhea is clear.     Right Sinus: Maxillary sinus tenderness and frontal sinus tenderness present.     Left Sinus: Maxillary sinus tenderness and frontal sinus tenderness present.     Mouth/Throat:     Lips: Pink. No lesions.     Pharynx: Posterior oropharyngeal  erythema present. No postnasal drip.  Cardiovascular:  Rate and Rhythm: Normal rate.     Pulses: Normal pulses.          Radial pulses are 2+ on the right side and 2+ on the left side.       Posterior tibial pulses are 2+ on the right side and 2+ on the left side.     Heart sounds: Normal heart sounds. No murmur heard.    No gallop.  Pulmonary:     Effort: Pulmonary effort is normal. No respiratory distress.     Breath sounds: Decreased air movement and transmitted upper airway sounds present. No stridor. Examination of the right-middle field reveals decreased breath sounds. Examination of the left-middle field reveals decreased breath sounds. Examination of the right-lower field reveals decreased breath sounds and wheezing. Examination of the left-lower field reveals decreased breath sounds. Decreased breath sounds and wheezing present. No rhonchi or rales.  Musculoskeletal:     Cervical back: Full passive range of motion without pain and neck supple.     Right lower leg: No edema.     Left lower leg: No edema.  Lymphadenopathy:     Head:     Right side of head: No submental, submandibular, tonsillar, preauricular or posterior auricular adenopathy.     Left side of head: No submental, submandibular, tonsillar, preauricular or posterior auricular adenopathy.     Cervical: No cervical adenopathy.     Right cervical: No superficial or deep cervical adenopathy.    Left cervical: No superficial or deep cervical adenopathy.  Skin:    General: Skin is warm.     Capillary Refill: Capillary refill takes less than 2 seconds.  Neurological:     General: No focal deficit present.     Mental Status: He is alert, oriented to person, place, and time and easily aroused. Mental status is at baseline.     GCS: GCS eye subscore is 4. GCS verbal subscore is 5. GCS motor subscore is 6.     Motor: No weakness.  Psychiatric:        Attention and Perception: Attention and perception normal.        Mood  and Affect: Mood and affect normal.        Speech: Speech normal.        Behavior: Behavior normal. Behavior is cooperative.        Thought Content: Thought content normal. Thought content does not include homicidal or suicidal ideation. Thought content does not include homicidal or suicidal plan.        Cognition and Memory: Cognition and memory normal.        Judgment: Judgment normal.     Results for orders placed or performed during the hospital encounter of 05/07/23  Basic metabolic panel   Collection Time: 05/07/23  9:42 AM  Result Value Ref Range   Sodium 143 135 - 145 mmol/L   Potassium 4.4 3.5 - 5.1 mmol/L   Chloride 103 98 - 111 mmol/L   CO2 25 22 - 32 mmol/L   Glucose, Bld 108 (H) 70 - 99 mg/dL   BUN 20 8 - 23 mg/dL   Creatinine, Ser 0.98 (H) 0.61 - 1.24 mg/dL   Calcium 9.9 8.9 - 11.9 mg/dL   GFR, Estimated 58 (L) >60 mL/min   Anion gap 15 5 - 15  CBC   Collection Time: 05/07/23  9:42 AM  Result Value Ref Range   WBC 7.6 4.0 - 10.5 K/uL   RBC 5.47 4.22 - 5.81 MIL/uL  Hemoglobin 16.9 13.0 - 17.0 g/dL   HCT 47.4 25.9 - 56.3 %   MCV 93.4 80.0 - 100.0 fL   MCH 30.9 26.0 - 34.0 pg   MCHC 33.1 30.0 - 36.0 g/dL   RDW 87.5 64.3 - 32.9 %   Platelets 234 150 - 400 K/uL   nRBC 0.0 0.0 - 0.2 %  Troponin I (High Sensitivity)   Collection Time: 05/07/23  9:42 AM  Result Value Ref Range   Troponin I (High Sensitivity) 9 <18 ng/L  Troponin I (High Sensitivity)   Collection Time: 05/07/23 11:19 AM  Result Value Ref Range   Troponin I (High Sensitivity) 8 <18 ng/L       05/07/2023    8:25 AM 04/01/2023   12:26 PM 08/25/2022   10:20 AM 06/13/2022   12:30 PM 04/15/2022    9:07 AM  Depression screen PHQ 2/9  Decreased Interest 0 0 0 0 0  Down, Depressed, Hopeless 0 0 0 0 0  PHQ - 2 Score 0 0 0 0 0  Altered sleeping 0  0 0 0  Tired, decreased energy 0  0 0 0  Change in appetite 0  0 0 0  Feeling bad or failure about yourself  0  0 0 0  Trouble concentrating 0  0 0 0   Moving slowly or fidgety/restless 0  0 0 0  Suicidal thoughts 0  0 0 0  PHQ-9 Score 0  0 0 0  Difficult doing work/chores Not difficult at all  Not difficult at all Not difficult at all Not difficult at all       05/07/2023    8:25 AM 08/25/2022   10:20 AM 06/13/2022   12:30 PM 02/19/2022    8:39 AM  GAD 7 : Generalized Anxiety Score  Nervous, Anxious, on Edge 0 0 0 0  Control/stop worrying 0 0 0 0  Worry too much - different things 0 0 0 0  Trouble relaxing 0 0 0 0  Restless 0 0 0 0  Easily annoyed or irritable 0 0 0 0  Afraid - awful might happen 0 0 0 0  Total GAD 7 Score 0 0 0 0  Anxiety Difficulty Not difficult at all Not difficult at all Not difficult at all Not difficult at all      Pertinent labs & imaging results that were available during my care of the patient were reviewed by me and considered in my medical decision making.  Assessment & Plan:  Vishruth was seen today for cough, congestion, headache, fatigue.  Diagnoses and all orders for this visit:  Acute cough Labs as below. Will communicate results to patient once available. Will await results to determine next steps.  Will send in medications as below for suspected COPD exacerbation. Discussed with pt that he could take home covid test if he would like and call with the results as lab from today will likely not be resulted before the weekend.  -     Veritor Flu A/B Waived -     Cancel: RSV Ag, EIA -     Novel Coronavirus, NAA ([facility]) -     RSV Ag, Immunochr, Waived -     predniSONE (DELTASONE) 20 MG tablet; Take 2 tablets (40 mg total) by mouth daily with breakfast for 5 days. -     cefdinir (OMNICEF) 300 MG capsule; Take 1 capsule (300 mg total) by mouth 2 (two) times daily. 1 po BID  Chronic obstructive  pulmonary disease with acute exacerbation (HCC) As above  -     predniSONE (DELTASONE) 20 MG tablet; Take 2 tablets (40 mg total) by mouth daily with breakfast for 5 days. -     cefdinir (OMNICEF) 300  MG capsule; Take 1 capsule (300 mg total) by mouth 2 (two) times daily. 1 po BID  Continue all other maintenance medications.  Follow up plan: Return if symptoms worsen or fail to improve.   Continue healthy lifestyle choices, including diet (rich in fruits, vegetables, and lean proteins, and low in salt and simple carbohydrates) and exercise (at least 30 minutes of moderate physical activity daily).  Written and verbal instructions provided   The above assessment and management plan was discussed with the patient. The patient verbalized understanding of and has agreed to the management plan. Patient is aware to call the clinic if they develop any new symptoms or if symptoms persist or worsen. Patient is aware when to return to the clinic for a follow-up visit. Patient educated on when it is appropriate to go to the emergency department.   Neale Burly, DNP-FNP [facility] [address] [place], [place] [address] [phone]

## 2023-06-19 ENCOUNTER — Other Ambulatory Visit: Payer: Self-pay | Admitting: Family Medicine

## 2023-06-19 LAB — NOVEL CORONAVIRUS, NAA: SARS-CoV-2, NAA: DETECTED — AB

## 2023-06-19 MED ORDER — MOLNUPIRAVIR EUA 200MG CAPSULE: 4.0000 | ORAL_CAPSULE | Freq: Two times a day (BID) | ORAL | 0 refills | Status: AC

## 2023-06-19 NOTE — Addendum Note (Signed)
Addended by: Neale Burly on: 06/19/2023 05:40 PM   Modules accepted: Orders

## 2023-06-19 NOTE — Progress Notes (Signed)
Sent in antiviral for covid. Recommend isolating until 24 hour symptom free.

## 2023-07-13 ENCOUNTER — Ambulatory Visit (HOSPITAL_COMMUNITY)
Admission: RE | Admit: 2023-07-13 | Discharge: 2023-07-13 | Disposition: A | Discharge status: Home / Self Care | Payer: PPO | Source: Ambulatory Visit | Attending: Internal Medicine | Admitting: Internal Medicine

## 2023-07-13 DIAGNOSIS — Z122 Encounter for screening for malignant neoplasm of respiratory organs: Secondary | ICD-10-CM | POA: Insufficient documentation

## 2023-07-13 DIAGNOSIS — Z87891 Personal history of nicotine dependence: Secondary | ICD-10-CM | POA: Insufficient documentation

## 2023-07-13 DIAGNOSIS — F17210 Nicotine dependence, cigarettes, uncomplicated: Secondary | ICD-10-CM | POA: Diagnosis not present

## 2023-07-20 ENCOUNTER — Telehealth: Payer: Self-pay | Admitting: Acute Care

## 2023-07-20 NOTE — Telephone Encounter (Signed)
IMPRESSION: 1. New nodules, largest of which in the right lower lobe is categorized as Lung-RADS 4BS, suspicious. Additional imaging evaluation or consultation with Pulmonology or Thoracic Surgery recommended. 2. The "S" modifier above refers to potentially clinically significant non lung cancer related findings. Specifically, there is aortic atherosclerosis, in addition to left main and 2 vessel coronary artery disease. Please note that although the presence of coronary artery calcium documents the presence of coronary artery disease, the severity of this disease and any potential stenosis cannot be assessed on this non-gated CT examination. Assessment for potential risk factor modification, dietary therapy or pharmacologic therapy may be warranted, if clinically indicated. 3. Mild diffuse bronchial wall thickening with moderate centrilobular and paraseptal emphysema; imaging findings suggestive of underlying COPD.

## 2023-07-20 NOTE — Telephone Encounter (Signed)
Call Report  IMPRESSION: 1. New nodules, largest of which in the right lower lobe is categorized as Lung-RADS 4BS, suspicious. Additional imaging evaluation or consultation with Pulmonology or Thoracic Surgery recommended. 2. The "S" modifier above refers to potentially clinically significant non lung cancer related findings. Specifically, there is aortic atherosclerosis, in addition to left main and 2 vessel coronary artery disease. Please note that although the presence of coronary artery calcium documents the presence of coronary artery disease, the severity of this disease and any potential stenosis cannot be assessed on this non-gated CT examination. Assessment for potential risk factor modification, dietary therapy or pharmacologic therapy may be warranted, if clinically indicated. 3. Mild diffuse bronchial wall thickening with moderate centrilobular and paraseptal emphysema; imaging findings suggestive of underlying COPD.

## 2023-07-20 NOTE — Telephone Encounter (Signed)
Ct from January 22

## 2023-07-24 ENCOUNTER — Telehealth: Payer: Self-pay | Admitting: Acute Care

## 2023-07-24 NOTE — Telephone Encounter (Signed)
I have called the patient with the results of his low-dose screening CT.  His scan was read as a lung RADS 4B.  Low-dose CT done in July 13, 2023 shows multiple small pulmonary nodules noted in the lungs bilaterally.  The most concerning of which are 2 new nodules 1 in the right lower lobe measuring 8.2 mm and 1 in the left lower lobe measuring 7.1 mm. I asked the patient if he has been sick, and he stated he had COVID over Christmas.  Plan will be for 35-month follow-up low-dose screening CT to better evaluate the nodule and see if it has remained stable, grown, or shrunk. I am unsure if the new nodules noted on this scan are indicative of the COVID virus he had over Christmas.  Follow-up scan will allow [place] to review surveilled him and see if they are stable, have shrunk, or have grown on follow-up If nodules persist or grow on follow-up imaging,  PET scan will be ordered to further evaluate this finding.  Jonita Albee and Jill Side I have added patient's PCP onto this message. Please order 74-month follow-up low-dose screening CT to be due after August 13, 2023 to reevaluate these nodules. Thank you so much

## 2023-07-27 ENCOUNTER — Telehealth: Payer: Self-pay

## 2023-07-27 ENCOUNTER — Other Ambulatory Visit: Payer: Self-pay

## 2023-07-27 DIAGNOSIS — Z122 Encounter for screening for malignant neoplasm of respiratory organs: Secondary | ICD-10-CM

## 2023-07-27 DIAGNOSIS — F17210 Nicotine dependence, cigarettes, uncomplicated: Secondary | ICD-10-CM

## 2023-07-27 DIAGNOSIS — R911 Solitary pulmonary nodule: Secondary | ICD-10-CM

## 2023-07-27 DIAGNOSIS — Z87891 Personal history of nicotine dependence: Secondary | ICD-10-CM

## 2023-07-27 NOTE — Telephone Encounter (Signed)
-----   Message from Elige Radon Dettinger sent at 07/27/2023  1:20 PM EST ----- Looks like patient has already been notified about this but just make sure he has a follow-up scan for these nodules ----- Message ----- From: Alphonsa Gin, RN Sent: 07/27/2023   9:17 AM EST To: Elige Radon Dettinger, MD  Results reviewed with patient. Plan is to repeat scan in 3 months. Message also sent in chart from [place], NP. Thanks, Revonda Standard, RN

## 2023-07-27 NOTE — Telephone Encounter (Signed)
3 month follow up scan order placed. Results and plan also faxed to Dr. Louanne Skye.

## 2023-07-27 NOTE — Telephone Encounter (Signed)
Pt has been made aware by Dr. Saralyn Pilar office. Pt is to repeat scan in three months.

## 2023-08-10 ENCOUNTER — Telehealth: Payer: Self-pay

## 2023-08-10 ENCOUNTER — Telehealth (INDEPENDENT_AMBULATORY_CARE_PROVIDER_SITE_OTHER): Payer: PPO | Admitting: Nurse Practitioner

## 2023-08-10 ENCOUNTER — Encounter: Payer: Self-pay | Admitting: Nurse Practitioner

## 2023-08-10 DIAGNOSIS — R6889 Other general symptoms and signs: Secondary | ICD-10-CM | POA: Insufficient documentation

## 2023-08-10 DIAGNOSIS — J069 Acute upper respiratory infection, unspecified: Secondary | ICD-10-CM | POA: Insufficient documentation

## 2023-08-10 LAB — VERITOR FLU A/B WAIVED
Influenza A: NEGATIVE
Influenza B: NEGATIVE

## 2023-08-10 MED ORDER — GUAIFENESIN 400 MG PO TABS: 400.0000 mg | ORAL_TABLET | Freq: Four times a day (QID) | ORAL | 0 refills | Status: DC | PRN

## 2023-08-10 MED ORDER — FLUTICASONE PROPIONATE 50 MCG/ACT NA SUSP: 2.0000 | Freq: Every day | NASAL | 6 refills | Status: AC

## 2023-08-10 NOTE — Telephone Encounter (Signed)
Copied from CRM [id number]. Topic: Clinical - Lab/Test Results >> Aug 10, 2023  1:41 PM Carlatta H wrote: Reason for CRM: Please call patient back with flu results

## 2023-08-10 NOTE — Telephone Encounter (Signed)
Would like results

## 2023-08-10 NOTE — Progress Notes (Signed)
Virtual Visit via video Note Due to COVID-19 pandemic this visit was conducted virtually. This visit type was conducted due to national recommendations for restrictions regarding the COVID-19 Pandemic (e.g. social distancing, sheltering in place) in an effort to limit this patient's exposure and mitigate transmission in our community. All issues noted in this document were discussed and addressed.  A physical exam was not performed with this format.   I connected with [name] [name] on 08/10/2023 at 0840 by name and DOB and verified that I am speaking with the correct person using two identifiers. [name] [name] is currently located at home during visit. The provider, Martina Sinner, DNP is located in their office at time of visit.  I discussed the limitations, risks, security and privacy concerns of performing an evaluation and management service by virtual visit and the availability of in person appointments. I also discussed with the patient that there may be a patient responsible charge related to this service. The patient expressed understanding and agreed to proceed.  Subjective:  Patient ID: [name] [name], male    DOB: 1951-03-13, 73 y.o.   MRN: [id number]  Chief Complaint:  URI ("HA, congestion, myalgia, chills fever 99.9 relieve with ibuprofen since Saturday")   HPI: [name] [name] is a 73 y.o. male presenting on 08/10/2023 for URI ("HA, congestion, myalgia, chills fever 99.9 relieve with ibuprofen since Saturday")  [name] [name] is a 73 y.o. male who complains of congestion, dry cough, myalgias, headache, fever, chills, and fevers up to 99.9 degrees for 2 days. He denies a history of anorexia, chest pain, nausea, and shortness of breath and has a history of COPD.   He will be coming t the office to get a swab for flu A&B  Relevant past medical, surgical, family, and social history reviewed and updated as indicated.  Allergies and medications reviewed and  updated.   Past Medical History:  Diagnosis Date   Arthritis    Carotid artery stenosis without cerebral infarction    vascular --- dr Edilia Bo;   09-24-2007  s/p right CEA   Chronic cholecystitis    w/  large calcified stone in gallbladder   Chronic constipation    COPD (chronic obstructive pulmonary disease) (HCC)    followed by pcp----  (05-28-2021  pt stated last used rescue 05-27-2021 for wheezing resolved after use, stated exacerbation approx  greater than a year ago)   Coronary artery disease 2002   cardiologist-- dr Antoine Poche ;  (05-28-2021  pt denies need to use nitro for CP) per last cath 08-04-2017  LM luminal irregularities, LAD 30% ,D1 ostial 35% ,CFx   AV groove ostial 25% , OM1 long prox 30%,  proxRCA occluded and dominant,   excellent left to right collateral filling.   Depression    Patient denies   DOE (dyspnea on exertion)    due to smoking/ copd   Dyslipidemia    Essential hypertension    Full dentures    GERD (gastroesophageal reflux disease)    History of adenomatous polyp of colon    History of COVID-19    per pt winter 2022, mild to moderate symptoms that resolved   History of diverticulitis of colon    History of kidney stones    Hyperlipidemia    Hypothyroidism    followed by pcp   Lung nodule    s/p bronchoscopy w/ bx in 2005,  benign   Osteoporosis    Smokers' cough (HCC)  05-28-2021  pt stated occasionally productive w/ white phlegm   Wears glasses     Past Surgical History:  Procedure Laterality Date   BRONCHOSCOPY  2005   CARDIAC CATHETERIZATION  08/11/2000   @[facility]  by dr Riley Kill;   preserved LVF,  ostial RCA 70-75%, other nonobstructive disease   CARDIAC CATHETERIZATION  03/30/2008   @[facility]  by dr hochrein;  occluded RCA and other nonobstructive involving LAD, CFx, D1,OM1  and left to right collateral filling,  mild inferior hypokinesis,  ef 55%   CAROTID ENDARTERECTOMY Right 09/24/2007   @[facility]  by dr Edilia Bo   COLONOSCOPY  01/19/2017   by  armsbruster   EXCISION NEUROMA Left 10/01/2005   @[facility] ;  left inguinal   EXTRACORPOREAL SHOCK WAVE LITHOTRIPSY  2015   INGUINAL HERNIA REPAIR Left    01-13-2003 and 07-22-2006 both @[facility]    LAPAROSCOPIC CHOLECYSTECTOMY SINGLE SITE WITH INTRAOPERATIVE CHOLANGIOGRAM N/A 05/30/2021   Procedure: LAPAROSCOPIC CHOLECYSTECTOMY SINGLE SITE WITH INTRAOPERATIVE CHOLANGIOGRAM;  Surgeon: Karie Soda, MD;  Location: [facility];  Service: General;  Laterality: N/A;   LAPAROSCOPIC INGUINAL HERNIA REPAIR Bilateral 04/28/2007   @[facility]    LEFT HEART CATH AND CORONARY ANGIOGRAPHY N/A 08/04/2017   Procedure: LEFT HEART CATH AND CORONARY ANGIOGRAPHY;  Surgeon: Kathleene Hazel, MD;  Location: [facility];  Service: Cardiovascular;  Laterality: N/A;   THROAT SURGERY  1997   pt stated removal of tumor that was benign    Social History   Socioeconomic History   Marital status: Married    Spouse name: [name]   Number of children: 4   Years of education: high school diploma   Highest education level: 12th grade  Occupational History   Occupation: truck Air traffic controller: [employer]  Tobacco Use   Smoking status: Every Day    Current packs/day: 1.00    Average packs/day: 1 pack/day for 58.0 years (58.0 ttl pk-yrs)    Types: Cigarettes   Smokeless tobacco: Never  Vaping Use   Vaping status: Never Used  Substance and Sexual Activity   Alcohol use: Yes    Alcohol/week: 2.0 standard drinks of alcohol    Types: 2 Standard drinks or equivalent per week   Drug use: No   Sexual activity: Yes    Birth control/protection: None  Other Topics Concern   Not on file  Social History Narrative   Lives with wife in a split level home.    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   Social Drivers of Corporate investment banker  Strain: Low Risk  (04/01/2023)   Overall Financial Resource Strain (CARDIA)    Difficulty of Paying Living Expenses: Not hard at all  Food Insecurity: No Food Insecurity (04/01/2023)   Hunger Vital Sign    Worried About Running Out of Food in the Last Year: Never true    Ran Out of Food in the Last Year: Never true  Transportation Needs: No Transportation Needs (04/01/2023)   PRAPARE - Administrator, Civil Service (Medical): No    Lack of Transportation (Non-Medical): No  Physical Activity: Sufficiently Active (04/01/2023)   Exercise Vital Sign    Days of Exercise per Week: 5 days    Minutes of Exercise per Session: 30 min  Stress: No Stress Concern Present (04/01/2023)  [name]    Feeling of Stress : Not at all  Social Connections: Socially Integrated (04/01/2023)   Social Connection and Isolation Panel [NHANES]    Frequency of Communication with Friends and Family: More than three times a week    Frequency of Social Gatherings with Friends and Family: More than three times a week    Attends Religious Services: More than 4 times per year    Active Member of [employer] or Organizations: Yes    Attends Engineer, structural: More than 4 times per year    Marital Status: Married  Catering manager Violence: Not At Risk (04/01/2023)   Humiliation, Afraid, Rape, and Kick questionnaire    Fear of Current or Ex-Partner: No    Emotionally Abused: No    Physically Abused: No    Sexually Abused: No    Outpatient Encounter Medications as of 08/10/2023  Medication Sig   fluticasone (FLONASE) 50 MCG/ACT nasal spray Place 2 sprays into both nostrils daily.   guaifenesin (HUMIBID E) 400 MG TABS tablet Take 1 tablet (400 mg total) by mouth every 6 (six) hours as needed.   albuterol (VENTOLIN HFA) 108 (90 Base) MCG/ACT inhaler INHALE TWO PUFFS EVERY 6 HOURS AS NEEDED (Patient not taking: Reported on 06/18/2023)    amLODipine (NORVASC) 5 MG tablet Take 1 tablet (5 mg total) by mouth daily.   aspirin EC 81 MG tablet Take 1 tablet (81 mg total) by mouth daily. Swallow whole.   Bempedoic Acid-Ezetimibe (NEXLIZET) 180-10 MG TABS Take 1 tablet by mouth daily.   levothyroxine (SYNTHROID) 50 MCG tablet Take 1 tablet (50 mcg total) by mouth daily.   lisinopril (ZESTRIL) 20 MG tablet TAKE ONE TABLET DAILY   omeprazole (PRILOSEC) 20 MG capsule Take 1 capsule by mouth daily.   TRELEGY ELLIPTA 100-62.5-25 MCG/ACT AEPB Inhale 1 puff into the lungs in the morning.   No facility-administered encounter medications on file as of 08/10/2023.    Allergies  Allergen Reactions   Crestor [Rosuvastatin Calcium] Other (See Comments)    Lethargy and muscle aches   Flomax [Tamsulosin Hcl] Other (See Comments)    Made patient hypotensive, clammy, sweaty, and feel like he was going to pass out (EMS HAD TO BE CALLED)   Lipitor [Atorvastatin Calcium] Other (See Comments)    Body cramps, lethargy and muscle aches    Review of Systems  Constitutional:  Positive for chills and fever.  HENT:  Positive for congestion and postnasal drip. Negative for ear pain and sore throat.   Respiratory:  Positive for cough. Negative for apnea and chest tightness.        Non productive  Gastrointestinal:  Negative for diarrhea, nausea and vomiting.  Musculoskeletal:  Positive for myalgias.  Skin:  Negative for color change, pallor and rash.  Neurological:  Positive for headaches. Negative for dizziness and weakness.      Observations/Objective: No vital signs or physical exam, this was a virtual health encounter.  Pt alert and oriented, answers all questions appropriately, and able to speak in full sentences.    Assessment and Plan: [name] [name] was seen today for uri.  Diagnoses and all orders for this visit:  Flu-like symptoms -     Veritor Flu A/B Waived -     fluticasone (FLONASE) 50 MCG/ACT nasal spray; Place 2 sprays into both  nostrils daily. -     guaifenesin (HUMIBID E) 400 MG TABS tablet; Take 1 tablet (400 mg  total) by mouth every 6 (six) hours as needed.  Viral URI with cough -     fluticasone (FLONASE) 50 MCG/ACT nasal spray; Place 2 sprays into both nostrils daily. -     guaifenesin (HUMIBID E) 400 MG TABS tablet; Take 1 tablet (400 mg total) by mouth every 6 (six) hours as needed.   POC FLU negative A & B viral upper respiratory illness Flonase for nasal congestion and guaifenesin 400 mg every 6 hours as needed for cough and congestion Increase hydration rest, apply heat to sinuses prn, encouraged very strongly to quit smoking, and return office visit prn if symptoms persist or worsen. Lack of antibiotic effectiveness discussed with him. Call or return to clinic prn if these symptoms worsen or fail to improve as anticipated.   Follow Up Instructions: Return if symptoms worsen or fail to improve.    I discussed the assessment and treatment plan with the patient. The patient was provided an opportunity to ask questions and all were answered. The patient agreed with the plan and demonstrated an understanding of the instructions.   The patient was advised to call back or seek an in-person evaluation if the symptoms worsen or if the condition fails to improve as anticipated.  The above assessment and management plan was discussed with the patient. The patient verbalized understanding of and has agreed to the management plan. Patient is aware to call the clinic if they develop any new symptoms or if symptoms persist or worsen. Patient is aware when to return to the clinic for a follow-up visit. Patient educated on when it is appropriate to go to the emergency department.    I provided 12 minutes of time during this video encounter.   Arrie Aran Santa Lighter, DNP [facility] [address] [place], [place] [address] [phone] 08/10/2023

## 2023-08-21 ENCOUNTER — Encounter: Payer: Self-pay | Admitting: Family Medicine

## 2023-08-21 ENCOUNTER — Ambulatory Visit: Payer: PPO | Admitting: Family Medicine

## 2023-08-21 VITALS — BP 160/88 | HR 89 | Temp 98.5°F | Ht 70.0 in | Wt 150.0 lb

## 2023-08-21 DIAGNOSIS — J441 Chronic obstructive pulmonary disease with (acute) exacerbation: Secondary | ICD-10-CM | POA: Diagnosis not present

## 2023-08-21 DIAGNOSIS — R051 Acute cough: Secondary | ICD-10-CM | POA: Diagnosis not present

## 2023-08-21 DIAGNOSIS — J3489 Other specified disorders of nose and nasal sinuses: Secondary | ICD-10-CM

## 2023-08-21 MED ORDER — DOXYCYCLINE HYCLATE 100 MG PO TABS: 100.0000 mg | ORAL_TABLET | Freq: Two times a day (BID) | ORAL | 0 refills | Status: AC

## 2023-08-21 MED ORDER — BENZONATATE 100 MG PO CAPS: 100.0000 mg | ORAL_CAPSULE | Freq: Three times a day (TID) | ORAL | 0 refills | Status: DC | PRN

## 2023-08-21 MED ORDER — PREDNISONE 20 MG PO TABS: 40.0000 mg | ORAL_TABLET | Freq: Every day | ORAL | 0 refills | Status: AC

## 2023-08-21 NOTE — Progress Notes (Signed)
 Subjective:  Patient ID: [name] [name], male    DOB: 1951-01-23, 73 y.o.   MRN: [id number]  Patient Care Team: Dettinger, Elige Radon, MD as PCP - General (Family Medicine) Rollene Rotunda, MD as PCP - Cardiology (Cardiology) Rollene Rotunda, MD as Consulting Physician (Cardiology) Danella Maiers, [facility] (Pharmacist) Karie Soda, MD as Consulting Physician (General Surgery) Armbruster, Willaim Rayas, MD as Consulting Physician (Gastroenterology)   Chief Complaint:  cold/ flu like symptoms   HPI: [name] [name] is a 73 y.o. male presenting on 08/21/2023 for cold/ flu like symptoms  HPI 1. Chronic obstructive pulmonary disease with acute exacerbation (HCC) States that symptoms started 2 weeks ago. Has headache, scratchy throat, chest tightness, heavy cough, rhinorrhea, decreased app. Productive cough with yellow sputum.  Negative flu on exam last week, 08/10/2023. Recently had covid in December. Taking mucinex only OTC. Using albuterol once daily. Compliant with trelegy.   Relevant past medical, surgical, family, and social history reviewed and updated as indicated.  Allergies and medications reviewed and updated. Data reviewed: Chart in Epic.   Past Medical History:  Diagnosis Date   Arthritis    Carotid artery stenosis without cerebral infarction    vascular --- dr Edilia Bo;   09-24-2007  s/p right CEA   Chronic cholecystitis    w/  large calcified stone in gallbladder   Chronic constipation    COPD (chronic obstructive pulmonary disease) (HCC)    followed by pcp----  (05-28-2021  pt stated last used rescue 05-27-2021 for wheezing resolved after use, stated exacerbation approx  greater than a year ago)   Coronary artery disease 2002   cardiologist-- dr Antoine Poche ;  (05-28-2021  pt denies need to use nitro for CP) per last cath 08-04-2017  LM luminal irregularities, LAD 30% ,D1 ostial 35% ,CFx   AV groove ostial 25% , OM1 long prox 30%,  proxRCA occluded and dominant,   excellent  left to right collateral filling.   Depression    Patient denies   DOE (dyspnea on exertion)    due to smoking/ copd   Dyslipidemia    Essential hypertension    Full dentures    GERD (gastroesophageal reflux disease)    History of adenomatous polyp of colon    History of COVID-19    per pt winter 2022, mild to moderate symptoms that resolved   History of diverticulitis of colon    History of kidney stones    Hyperlipidemia    Hypothyroidism    followed by pcp   Lung nodule    s/p bronchoscopy w/ bx in 2005,  benign   Osteoporosis    Smokers' cough (HCC)    05-28-2021  pt stated occasionally productive w/ white phlegm   Wears glasses    Past Surgical History:  Procedure Laterality Date   BRONCHOSCOPY  2005   CARDIAC CATHETERIZATION  08/11/2000   @[facility]  by dr Riley Kill;   preserved LVF,  ostial RCA 70-75%, other nonobstructive disease   CARDIAC CATHETERIZATION  03/30/2008   @[facility]  by dr hochrein;  occluded RCA and other nonobstructive involving LAD, CFx, D1,OM1  and left to right collateral filling,  mild inferior hypokinesis,  ef 55%   CAROTID ENDARTERECTOMY Right 09/24/2007   @[facility]  by dr Edilia Bo   COLONOSCOPY  01/19/2017   by armsbruster   EXCISION NEUROMA Left 10/01/2005   @[facility] ;  left inguinal   EXTRACORPOREAL SHOCK WAVE LITHOTRIPSY  2015   INGUINAL HERNIA REPAIR Left    01-13-2003 and  07-22-2006 both @[facility]    LAPAROSCOPIC CHOLECYSTECTOMY SINGLE SITE WITH INTRAOPERATIVE CHOLANGIOGRAM N/A 05/30/2021   Procedure: LAPAROSCOPIC CHOLECYSTECTOMY SINGLE SITE WITH INTRAOPERATIVE CHOLANGIOGRAM;  Surgeon: Karie Soda, MD;  Location: [facility];  Service: General;  Laterality: N/A;   LAPAROSCOPIC INGUINAL HERNIA REPAIR Bilateral 04/28/2007   @[facility]    LEFT HEART CATH AND CORONARY ANGIOGRAPHY N/A 08/04/2017   Procedure: LEFT HEART CATH AND CORONARY ANGIOGRAPHY;  Surgeon: Kathleene Hazel, MD;  Location: [facility];  Service: Cardiovascular;  Laterality: N/A;    THROAT SURGERY  1997   pt stated removal of tumor that was benign    Social History   Socioeconomic History   Marital status: Married    Spouse name: [name]   Number of children: 4   Years of education: high school diploma   Highest education level: 12th grade  Occupational History   Occupation: truck Air traffic controller: [employer]  Tobacco Use   Smoking status: Every Day    Current packs/day: 1.00    Average packs/day: 1 pack/day for 58.0 years (58.0 ttl pk-yrs)    Types: Cigarettes   Smokeless tobacco: Never  Vaping Use   Vaping status: Never Used  Substance and Sexual Activity   Alcohol use: Yes    Alcohol/week: 2.0 standard drinks of alcohol    Types: 2 Standard drinks or equivalent per week   Drug use: No   Sexual activity: Yes    Birth control/protection: None  Other Topics Concern   Not on file  Social History Narrative   Lives with wife in a split level home.    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   Social Drivers of Corporate investment banker Strain: Low Risk  (04/01/2023)   Overall Financial Resource Strain (CARDIA)    Difficulty of Paying Living Expenses: Not hard at all  Food Insecurity: No Food Insecurity (04/01/2023)   Hunger Vital Sign    Worried About Running Out of Food in the Last Year: Never true    Ran Out of Food in the Last Year: Never true  Transportation Needs: No Transportation Needs (04/01/2023)   PRAPARE - Administrator, Civil Service (Medical): No    Lack of Transportation (Non-Medical): No  Physical Activity: Sufficiently Active (04/01/2023)   Exercise Vital Sign    Days of Exercise per Week: 5 days    Minutes of Exercise per Session: 30 min  Stress: No Stress Concern Present (04/01/2023)   [name]     Feeling of Stress : Not at all  Social Connections: Socially Integrated (04/01/2023)   Social Connection and Isolation Panel [NHANES]    Frequency of Communication with Friends and Family: More than three times a week    Frequency of Social Gatherings with Friends and Family: More than three times a week    Attends Religious Services: More than 4 times per year    Active Member of [employer] or Organizations: Yes    Attends Engineer, structural: More than 4 times per year    Marital Status: Married  Catering manager Violence: Not At Risk (04/01/2023)   Humiliation, Afraid, Rape, and Kick questionnaire    Fear of Current or Ex-Partner: No    Emotionally Abused:  No    Physically Abused: No    Sexually Abused: No    Outpatient Encounter Medications as of 08/21/2023  Medication Sig   albuterol (VENTOLIN HFA) 108 (90 Base) MCG/ACT inhaler INHALE TWO PUFFS EVERY 6 HOURS AS NEEDED   amLODipine (NORVASC) 5 MG tablet Take 1 tablet (5 mg total) by mouth daily.   aspirin EC 81 MG tablet Take 1 tablet (81 mg total) by mouth daily. Swallow whole.   Bempedoic Acid-Ezetimibe (NEXLIZET) 180-10 MG TABS Take 1 tablet by mouth daily.   fluticasone (FLONASE) 50 MCG/ACT nasal spray Place 2 sprays into both nostrils daily.   guaifenesin (HUMIBID E) 400 MG TABS tablet Take 1 tablet (400 mg total) by mouth every 6 (six) hours as needed.   levothyroxine (SYNTHROID) 50 MCG tablet Take 1 tablet (50 mcg total) by mouth daily.   lisinopril (ZESTRIL) 20 MG tablet TAKE ONE TABLET DAILY   omeprazole (PRILOSEC) 20 MG capsule Take 1 capsule by mouth daily.   TRELEGY ELLIPTA 100-62.5-25 MCG/ACT AEPB Inhale 1 puff into the lungs in the morning.   No facility-administered encounter medications on file as of 08/21/2023.    Allergies  Allergen Reactions   Crestor [Rosuvastatin Calcium] Other (See Comments)    Lethargy and muscle aches   Flomax [Tamsulosin Hcl] Other (See Comments)    Made patient hypotensive,  clammy, sweaty, and feel like he was going to pass out (EMS HAD TO BE CALLED)   Lipitor [Atorvastatin Calcium] Other (See Comments)    Body cramps, lethargy and muscle aches    Review of Systems  All other systems reviewed and are negative.  Objective:  BP (!) 180/93   Pulse 89   Temp 98.5 F (36.9 C)   Ht 5\' 10"  (1.778 m)   Wt 150 lb (68 kg)   SpO2 96%   BMI 21.52 kg/m    Wt Readings from Last 3 Encounters:  08/21/23 150 lb (68 kg)  06/18/23 153 lb (69.4 kg)  05/07/23 152 lb (68.9 kg)   Physical Exam Constitutional:      General: He is awake. He is not in acute distress.    Appearance: Normal appearance. He is well-developed and well-groomed. He is ill-appearing. He is not toxic-appearing or diaphoretic.  HENT:     Right Ear: No drainage, swelling or tenderness. A middle ear effusion is present. There is no impacted cerumen. No foreign body. No mastoid tenderness. No PE tube. No hemotympanum. Tympanic membrane is not injected, scarred, perforated, erythematous, retracted or bulging.     Left Ear: No drainage, swelling or tenderness. A middle ear effusion is present. There is no impacted cerumen. No foreign body. No mastoid tenderness. No PE tube. No hemotympanum. Tympanic membrane is not injected, scarred, perforated, erythematous, retracted or bulging.     Nose: Congestion and rhinorrhea present. Rhinorrhea is clear.     Right Nostril: Occlusion present. No foreign body, epistaxis or septal hematoma.     Left Nostril: No foreign body, epistaxis, septal hematoma or occlusion.     Right Turbinates: Enlarged and swollen.     Left Turbinates: Not enlarged or swollen.     Right Sinus: No maxillary sinus tenderness or frontal sinus tenderness.     Left Sinus: No maxillary sinus tenderness or frontal sinus tenderness.     Mouth/Throat:     Lips: Pink.     Mouth: Mucous membranes are moist.     Tongue: No lesions.     Palate: No mass.  Pharynx: Posterior oropharyngeal erythema  present. No pharyngeal swelling, oropharyngeal exudate, uvula swelling or postnasal drip.     Tonsils: No tonsillar exudate or tonsillar abscesses.  Cardiovascular:     Rate and Rhythm: Normal rate and regular rhythm.     Pulses: Normal pulses.          Radial pulses are 2+ on the right side and 2+ on the left side.       Posterior tibial pulses are 2+ on the right side and 2+ on the left side.     Heart sounds: Normal heart sounds. No murmur heard.    No gallop.  Pulmonary:     Effort: Pulmonary effort is normal. No respiratory distress.     Breath sounds: No stridor. Examination of the right-upper field reveals wheezing. Examination of the left-upper field reveals wheezing. Examination of the right-middle field reveals wheezing. Examination of the left-middle field reveals decreased breath sounds. Examination of the left-lower field reveals decreased breath sounds. Decreased breath sounds and wheezing present. No rhonchi or rales.     Comments: Anterior wheeze on expiration  Musculoskeletal:     Cervical back: Full passive range of motion without pain and neck supple.     Right lower leg: No edema.     Left lower leg: No edema.  Lymphadenopathy:     Head:     Right side of head: Tonsillar adenopathy present. No submental, submandibular, preauricular or posterior auricular adenopathy.     Left side of head: Tonsillar adenopathy present. No submental, submandibular, preauricular or posterior auricular adenopathy.     Cervical:     Right cervical: No superficial or deep cervical adenopathy.    Left cervical: No superficial or deep cervical adenopathy.  Skin:    General: Skin is warm.     Capillary Refill: Capillary refill takes less than 2 seconds.  Neurological:     General: No focal deficit present.     Mental Status: He is alert, oriented to person, place, and time and easily aroused. Mental status is at baseline.     GCS: GCS eye subscore is 4. GCS verbal subscore is 5. GCS motor  subscore is 6.     Motor: No weakness.  Psychiatric:        Attention and Perception: Attention and perception normal.        Mood and Affect: Mood and affect normal.        Speech: Speech normal.        Behavior: Behavior normal. Behavior is cooperative.        Thought Content: Thought content normal. Thought content does not include homicidal or suicidal ideation. Thought content does not include homicidal or suicidal plan.        Cognition and Memory: Cognition and memory normal.        Judgment: Judgment normal.     Results for orders placed or performed in visit on 08/10/23  Veritor Flu A/B Waived   Collection Time: 08/10/23  9:38 AM  Result Value Ref Range   Influenza A Negative Negative   Influenza B Negative Negative       08/21/2023    9:17 AM 06/18/2023    2:18 PM 05/07/2023    8:25 AM 04/01/2023   12:26 PM 08/25/2022   10:20 AM  Depression screen PHQ 2/9  Decreased Interest 0 0 0 0 0  Down, Depressed, Hopeless 0 0 0 0 0  PHQ - 2 Score 0 0 0 0 0  Altered sleeping   0  0  Tired, decreased energy   0  0  Change in appetite   0  0  Feeling bad or failure about yourself    0  0  Trouble concentrating   0  0  Moving slowly or fidgety/restless   0  0  Suicidal thoughts   0  0  PHQ-9 Score   0  0  Difficult doing work/chores   Not difficult at all  Not difficult at all       08/21/2023    9:18 AM 06/18/2023    2:18 PM 05/07/2023    8:25 AM 08/25/2022   10:20 AM  GAD 7 : Generalized Anxiety Score  Nervous, Anxious, on Edge 0 0 0 0  Control/stop worrying 0 0 0 0  Worry too much - different things 0 0 0 0  Trouble relaxing 0 0 0 0  Restless 0 0 0 0  Easily annoyed or irritable 0 0 0 0  Afraid - awful might happen 0 0 0 0  Total GAD 7 Score 0 0 0 0  Anxiety Difficulty Not difficult at all  Not difficult at all Not difficult at all   Pertinent labs & imaging results that were available during my care of the patient were reviewed by me and considered in my medical  decision making.  Assessment & Plan:  [name] [name] was seen today for cold/ flu like symptoms.  Diagnoses and all orders for this visit:  Chronic obstructive pulmonary disease with acute exacerbation (HCC) Will treat as below for suspected bacterial URI complicated by COPD exacerbation. Discussed with patient that he has had multiple prednisone bursts this past year. Recommend follow up with PCP and pulmnology. Continue home management with hydration, humidifier, mucinex.  -     doxycycline (VIBRA-TABS) 100 MG tablet; Take 1 tablet (100 mg total) by mouth 2 (two) times daily for 7 days. -     predniSONE (DELTASONE) 20 MG tablet; Take 2 tablets (40 mg total) by mouth daily with breakfast for 5 days.  Acute cough As above  -     doxycycline (VIBRA-TABS) 100 MG tablet; Take 1 tablet (100 mg total) by mouth 2 (two) times daily for 7 days. -     predniSONE (DELTASONE) 20 MG tablet; Take 2 tablets (40 mg total) by mouth daily with breakfast for 5 days. -     benzonatate (TESSALON PERLES) 100 MG capsule; Take 1 capsule (100 mg total) by mouth 3 (three) times daily as needed.  Rhinorrhea As above.  -     doxycycline (VIBRA-TABS) 100 MG tablet; Take 1 tablet (100 mg total) by mouth 2 (two) times daily for 7 days. -     predniSONE (DELTASONE) 20 MG tablet; Take 2 tablets (40 mg total) by mouth daily with breakfast for 5 days.  Continue all other maintenance medications.  Follow up plan: Return if symptoms worsen or fail to improve.   Continue healthy lifestyle choices, including diet (rich in fruits, vegetables, and lean proteins, and low in salt and simple carbohydrates) and exercise (at least 30 minutes of moderate physical activity daily).  Written and verbal instructions provided   The above assessment and management plan was discussed with the patient. The patient verbalized understanding of and has agreed to the management plan. Patient is aware to call the clinic if they develop any new  symptoms or if symptoms persist or worsen. Patient is aware when to return to the clinic  for a follow-up visit. Patient educated on when it is appropriate to go to the emergency department.   [name] Burly, DNP-FNP [facility] [address] [place], [place] [address] [phone]

## 2023-08-26 ENCOUNTER — Encounter: Payer: Self-pay | Admitting: Family Medicine

## 2023-08-26 ENCOUNTER — Ambulatory Visit (INDEPENDENT_AMBULATORY_CARE_PROVIDER_SITE_OTHER)

## 2023-08-26 ENCOUNTER — Ambulatory Visit: Admitting: Family Medicine

## 2023-08-26 VITALS — BP 154/75 | HR 90 | Temp 98.3°F | Ht 70.0 in | Wt 149.0 lb

## 2023-08-26 DIAGNOSIS — J449 Chronic obstructive pulmonary disease, unspecified: Secondary | ICD-10-CM | POA: Diagnosis not present

## 2023-08-26 DIAGNOSIS — R051 Acute cough: Secondary | ICD-10-CM

## 2023-08-26 DIAGNOSIS — J441 Chronic obstructive pulmonary disease with (acute) exacerbation: Secondary | ICD-10-CM

## 2023-08-26 DIAGNOSIS — R059 Cough, unspecified: Secondary | ICD-10-CM | POA: Diagnosis not present

## 2023-08-26 DIAGNOSIS — R062 Wheezing: Secondary | ICD-10-CM

## 2023-08-26 DIAGNOSIS — R918 Other nonspecific abnormal finding of lung field: Secondary | ICD-10-CM | POA: Diagnosis not present

## 2023-08-26 DIAGNOSIS — F17200 Nicotine dependence, unspecified, uncomplicated: Secondary | ICD-10-CM | POA: Diagnosis not present

## 2023-08-26 MED ORDER — IPRATROPIUM-ALBUTEROL 0.5-2.5 (3) MG/3ML IN SOLN
3.0000 mL | Freq: Once | RESPIRATORY_TRACT | Status: AC
  Administered 2023-08-26: 3 mL via RESPIRATORY_TRACT

## 2023-08-26 MED ORDER — AZITHROMYCIN 250 MG PO TABS: ORAL_TABLET | ORAL | 0 refills | Status: AC

## 2023-08-26 MED ORDER — ALBUTEROL SULFATE (2.5 MG/3ML) 0.083% IN NEBU: 2.5000 mg | INHALATION_SOLUTION | Freq: Four times a day (QID) | RESPIRATORY_TRACT | 1 refills | Status: DC | PRN

## 2023-08-26 NOTE — Addendum Note (Signed)
 Addended by: Neale Burly on: 08/26/2023 04:43 PM   Modules accepted: Level of Service

## 2023-08-26 NOTE — Progress Notes (Signed)
 Subjective:  Patient ID: [name] [name], male    DOB: 03-12-1951, 73 y.o.   MRN: [id number]  Patient Care Team: Dettinger, Elige Radon, MD as PCP - General (Family Medicine) Rollene Rotunda, MD as PCP - Cardiology (Cardiology) Rollene Rotunda, MD as Consulting Physician (Cardiology) Danella Maiers, [facility] (Pharmacist) Karie Soda, MD as Consulting Physician (General Surgery) Armbruster, Willaim Rayas, MD as Consulting Physician (Gastroenterology)   Chief Complaint:  URI (Still not feeling better/Chest achy from coughing/Labored breathing/)   HPI: [name] [name] is a 73 y.o. male presenting on 08/26/2023 for URI (Still not feeling better/Chest achy from coughing/Labored breathing/)  Symptoms started 3 weeks ago. Continues to have shortness of breath, cough, headaches, rhinorrhea. States that his chest feels heavy and sore. Has completed doxycycline yesterday. Also completed prednisone burst. Does not fee any better. Negative for flu previously. Continues to have yellow sputum. Denies fever, N/V. He is using albuterol 1-2 times per day.    Relevant past medical, surgical, family, and social history reviewed and updated as indicated.  Allergies and medications reviewed and updated. Data reviewed: Chart in Epic.   Past Medical History:  Diagnosis Date   Arthritis    Carotid artery stenosis without cerebral infarction    vascular --- dr Edilia Bo;   09-24-2007  s/p right CEA   Chronic cholecystitis    w/  large calcified stone in gallbladder   Chronic constipation    COPD (chronic obstructive pulmonary disease) (HCC)    followed by pcp----  (05-28-2021  pt stated last used rescue 05-27-2021 for wheezing resolved after use, stated exacerbation approx  greater than a year ago)   Coronary artery disease 2002   cardiologist-- dr Antoine Poche ;  (05-28-2021  pt denies need to use nitro for CP) per last cath 08-04-2017  LM luminal irregularities, LAD 30% ,D1 ostial 35% ,CFx   AV groove ostial  25% , OM1 long prox 30%,  proxRCA occluded and dominant,   excellent left to right collateral filling.   Depression    Patient denies   DOE (dyspnea on exertion)    due to smoking/ copd   Dyslipidemia    Essential hypertension    Full dentures    GERD (gastroesophageal reflux disease)    History of adenomatous polyp of colon    History of COVID-19    per pt winter 2022, mild to moderate symptoms that resolved   History of diverticulitis of colon    History of kidney stones    Hyperlipidemia    Hypothyroidism    followed by pcp   Lung nodule    s/p bronchoscopy w/ bx in 2005,  benign   Osteoporosis    Smokers' cough (HCC)    05-28-2021  pt stated occasionally productive w/ white phlegm   Wears glasses     Past Surgical History:  Procedure Laterality Date   BRONCHOSCOPY  2005   CARDIAC CATHETERIZATION  08/11/2000   @[facility]  by dr Riley Kill;   preserved LVF,  ostial RCA 70-75%, other nonobstructive disease   CARDIAC CATHETERIZATION  03/30/2008   @[facility]  by dr hochrein;  occluded RCA and other nonobstructive involving LAD, CFx, D1,OM1  and left to right collateral filling,  mild inferior hypokinesis,  ef 55%   CAROTID ENDARTERECTOMY Right 09/24/2007   @[facility]  by dr Edilia Bo   COLONOSCOPY  01/19/2017   by armsbruster   EXCISION NEUROMA Left 10/01/2005   @[facility] ;  left inguinal   EXTRACORPOREAL SHOCK WAVE LITHOTRIPSY  2015  INGUINAL HERNIA REPAIR Left    01-13-2003 and 07-22-2006 both @[facility]    LAPAROSCOPIC CHOLECYSTECTOMY SINGLE SITE WITH INTRAOPERATIVE CHOLANGIOGRAM N/A 05/30/2021   Procedure: LAPAROSCOPIC CHOLECYSTECTOMY SINGLE SITE WITH INTRAOPERATIVE CHOLANGIOGRAM;  Surgeon: Karie Soda, MD;  Location: [facility];  Service: General;  Laterality: N/A;   LAPAROSCOPIC INGUINAL HERNIA REPAIR Bilateral 04/28/2007   @[facility]    LEFT HEART CATH AND CORONARY ANGIOGRAPHY N/A 08/04/2017   Procedure: LEFT HEART CATH AND CORONARY ANGIOGRAPHY;  Surgeon: Kathleene Hazel, MD;  Location:  [facility];  Service: Cardiovascular;  Laterality: N/A;   THROAT SURGERY  1997   pt stated removal of tumor that was benign    Social History   Socioeconomic History   Marital status: Married    Spouse name: [name]   Number of children: 4   Years of education: high school diploma   Highest education level: 12th grade  Occupational History   Occupation: truck Air traffic controller: [employer]  Tobacco Use   Smoking status: Every Day    Current packs/day: 1.00    Average packs/day: 1 pack/day for 58.0 years (58.0 ttl pk-yrs)    Types: Cigarettes   Smokeless tobacco: Never  Vaping Use   Vaping status: Never Used  Substance and Sexual Activity   Alcohol use: Yes    Alcohol/week: 2.0 standard drinks of alcohol    Types: 2 Standard drinks or equivalent per week   Drug use: No   Sexual activity: Yes    Birth control/protection: None  Other Topics Concern   Not on file  Social History Narrative   Lives with wife in a split level home.    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   Social Drivers of Corporate investment banker Strain: Low Risk  (04/01/2023)   Overall Financial Resource Strain (CARDIA)    Difficulty of Paying Living Expenses: Not hard at all  Food Insecurity: No Food Insecurity (04/01/2023)   Hunger Vital Sign    Worried About Running Out of Food in the Last Year: Never true    Ran Out of Food in the Last Year: Never true  Transportation Needs: No Transportation Needs (04/01/2023)   PRAPARE - Administrator, Civil Service (Medical): No    Lack of Transportation (Non-Medical): No  Physical Activity: Sufficiently Active (04/01/2023)   Exercise Vital Sign    Days of Exercise per Week: 5 days    Minutes of Exercise per Session: 30 min  Stress: No Stress Concern Present (04/01/2023)   [employer] of Occupational Health - Occupational Stress Questionnaire    Feeling of Stress : Not at all  Social Connections: Socially Integrated (04/01/2023)   Social Connection and Isolation Panel [NHANES]    Frequency of Communication with Friends and Family: More than three times a week    Frequency of Social Gatherings with Friends and Family: More than three times a week    Attends Religious Services: More than 4 times per year    Active Member of [employer] or Organizations: Yes    Attends Banker Meetings: More than 4 times per year    Marital Status: Married  Catering manager Violence: Not At Risk (04/01/2023)   Humiliation, Afraid, Rape, and Kick questionnaire    Fear of  Current or Ex-Partner: No    Emotionally Abused: No    Physically Abused: No    Sexually Abused: No    Outpatient Encounter Medications as of 08/26/2023  Medication Sig   albuterol (VENTOLIN HFA) 108 (90 Base) MCG/ACT inhaler INHALE TWO PUFFS EVERY 6 HOURS AS NEEDED   amLODipine (NORVASC) 5 MG tablet Take 1 tablet (5 mg total) by mouth daily.   aspirin EC 81 MG tablet Take 1 tablet (81 mg total) by mouth daily. Swallow whole.   Bempedoic Acid-Ezetimibe (NEXLIZET) 180-10 MG TABS Take 1 tablet by mouth daily.   benzonatate (TESSALON PERLES) 100 MG capsule Take 1 capsule (100 mg total) by mouth 3 (three) times daily as needed.   doxycycline (VIBRA-TABS) 100 MG tablet Take 1 tablet (100 mg total) by mouth 2 (two) times daily for 7 days.   fluticasone (FLONASE) 50 MCG/ACT nasal spray Place 2 sprays into both nostrils daily.   guaifenesin (HUMIBID E) 400 MG TABS tablet Take 1 tablet (400 mg total) by mouth every 6 (six) hours as needed.   levothyroxine (SYNTHROID) 50 MCG tablet Take 1 tablet (50 mcg total) by mouth daily.   lisinopril (ZESTRIL) 20 MG tablet TAKE ONE TABLET DAILY   omeprazole (PRILOSEC) 20 MG capsule Take 1 capsule by mouth daily.   predniSONE (DELTASONE) 20 MG tablet Take 2 tablets (40 mg total)  by mouth daily with breakfast for 5 days.   TRELEGY ELLIPTA 100-62.5-25 MCG/ACT AEPB Inhale 1 puff into the lungs in the morning.   No facility-administered encounter medications on file as of 08/26/2023.    Allergies  Allergen Reactions   Crestor [Rosuvastatin Calcium] Other (See Comments)    Lethargy and muscle aches   Flomax [Tamsulosin Hcl] Other (See Comments)    Made patient hypotensive, clammy, sweaty, and feel like he was going to pass out (EMS HAD TO BE CALLED)   Lipitor [Atorvastatin Calcium] Other (See Comments)    Body cramps, lethargy and muscle aches    Review of Systems As per HPI  Objective:  BP (!) 154/75   Pulse 90   Temp 98.3 F (36.8 C)   Ht 5\' 10"  (1.778 m)   Wt 149 lb (67.6 kg)   SpO2 94%   BMI 21.38 kg/m    Wt Readings from Last 3 Encounters:  08/26/23 149 lb (67.6 kg)  08/21/23 150 lb (68 kg)  06/18/23 153 lb (69.4 kg)   Physical Exam Constitutional:      General: He is awake. He is not in acute distress.    Appearance: Normal appearance. He is well-developed and well-groomed. He is ill-appearing. He is not toxic-appearing or diaphoretic.  HENT:     Right Ear: A middle ear effusion is present.     Left Ear: A middle ear effusion is present.     Nose: Congestion and rhinorrhea present. Rhinorrhea is clear.     Right Sinus: No maxillary sinus tenderness or frontal sinus tenderness.     Left Sinus: No maxillary sinus tenderness or frontal sinus tenderness.     Mouth/Throat:     Lips: Pink. No lesions.     Mouth: Mucous membranes are moist.     Pharynx: Posterior oropharyngeal erythema present. No pharyngeal swelling, oropharyngeal exudate, uvula swelling or postnasal drip.     Tonsils: No tonsillar exudate or tonsillar abscesses.  Cardiovascular:     Rate and Rhythm: Normal rate and regular rhythm.     Pulses: Normal pulses.  Radial pulses are 2+ on the right side and 2+ on the left side.       Posterior tibial pulses are 2+ on the right  side and 2+ on the left side.     Heart sounds: Normal heart sounds. No murmur heard.    No gallop.  Pulmonary:     Effort: Pulmonary effort is normal. No respiratory distress.     Breath sounds: No stridor. Examination of the right-upper field reveals wheezing. Examination of the left-upper field reveals wheezing. Examination of the right-middle field reveals wheezing. Examination of the left-middle field reveals wheezing. Examination of the right-lower field reveals wheezing and rales. Examination of the left-lower field reveals wheezing and rales. Wheezing and rales present. No rhonchi.  Musculoskeletal:     Cervical back: Full passive range of motion without pain and neck supple.     Right lower leg: No edema.     Left lower leg: No edema.  Skin:    General: Skin is warm.     Capillary Refill: Capillary refill takes less than 2 seconds.  Neurological:     General: No focal deficit present.     Mental Status: He is alert, oriented to person, place, and time and easily aroused. Mental status is at baseline.     GCS: GCS eye subscore is 4. GCS verbal subscore is 5. GCS motor subscore is 6.     Motor: No weakness.  Psychiatric:        Attention and Perception: Attention and perception normal.        Mood and Affect: Mood and affect normal.        Speech: Speech normal.        Behavior: Behavior normal. Behavior is cooperative.        Thought Content: Thought content normal. Thought content does not include homicidal or suicidal ideation. Thought content does not include homicidal or suicidal plan.        Cognition and Memory: Cognition and memory normal.        Judgment: Judgment normal.      Results for orders placed or performed in visit on 08/10/23  Veritor Flu A/B Waived   Collection Time: 08/10/23  9:38 AM  Result Value Ref Range   Influenza A Negative Negative   Influenza B Negative Negative       08/26/2023    2:19 PM 08/21/2023    9:17 AM 06/18/2023    2:18 PM  05/07/2023    8:25 AM 04/01/2023   12:26 PM  Depression screen PHQ 2/9  Decreased Interest 0 0 0 0 0  Down, Depressed, Hopeless 0 0 0 0 0  PHQ - 2 Score 0 0 0 0 0  Altered sleeping    0   Tired, decreased energy    0   Change in appetite    0   Feeling bad or failure about yourself     0   Trouble concentrating    0   Moving slowly or fidgety/restless    0   Suicidal thoughts    0   PHQ-9 Score    0   Difficult doing work/chores    Not difficult at all        08/26/2023    2:19 PM 08/21/2023    9:18 AM 06/18/2023    2:18 PM 05/07/2023    8:25 AM  GAD 7 : Generalized Anxiety Score  Nervous, Anxious, on Edge 0 0 0 0  Control/stop worrying  0 0 0 0  Worry too much - different things 0 0 0 0  Trouble relaxing 0 0 0 0  Restless 0 0 0 0  Easily annoyed or irritable 0 0 0 0  Afraid - awful might happen 0 0 0 0  Total GAD 7 Score 0 0 0 0  Anxiety Difficulty Not difficult at all Not difficult at all  Not difficult at all    Pertinent labs & imaging results that were available during my care of the patient were reviewed by me and considered in my medical decision making.  Assessment & Plan:  [place] was seen today for uri.  Diagnoses and all orders for this visit:  1. Wheeze (Primary) Imaging as below. Will communicate results to patient once available. Will await results to determine next steps.  Will start medication as below to cover for pneumonia. Provided nebulizer for symptom management. Discussed red flag symptoms. Will have close follow up with patient in 1 week to monitor response to therapy.  - DG Chest 2 View; Future - azithromycin (ZITHROMAX) 250 MG tablet; Take 2 tablets on day 1, then 1 tablet daily on days 2 through 5  Dispense: 6 tablet; Refill: 0 - For home use only DME Nebulizer machine - albuterol (PROVENTIL) (2.5 MG/3ML) 0.083% nebulizer solution; Take 3 mLs (2.5 mg total) by nebulization every 6 (six) hours as needed for wheezing or shortness of breath.   Dispense: 150 mL; Refill: 1  2. Chronic obstructive pulmonary disease with acute exacerbation (HCC) As above.  - DG Chest 2 View; Future - azithromycin (ZITHROMAX) 250 MG tablet; Take 2 tablets on day 1, then 1 tablet daily on days 2 through 5  Dispense: 6 tablet; Refill: 0 - For home use only DME Nebulizer machine - albuterol (PROVENTIL) (2.5 MG/3ML) 0.083% nebulizer solution; Take 3 mLs (2.5 mg total) by nebulization every 6 (six) hours as needed for wheezing or shortness of breath.  Dispense: 150 mL; Refill: 1  3. Acute cough As above.  - DG Chest 2 View; Future - azithromycin (ZITHROMAX) 250 MG tablet; Take 2 tablets on day 1, then 1 tablet daily on days 2 through 5  Dispense: 6 tablet; Refill: 0 - For home use only DME Nebulizer machine - albuterol (PROVENTIL) (2.5 MG/3ML) 0.083% nebulizer solution; Take 3 mLs (2.5 mg total) by nebulization every 6 (six) hours as needed for wheezing or shortness of breath.  Dispense: 150 mL; Refill: 1     Continue all other maintenance medications.  Follow up plan: Return in about 1 week (around 09/02/2023) for lung follow up .   Continue healthy lifestyle choices, including diet (rich in fruits, vegetables, and lean proteins, and low in salt and simple carbohydrates) and exercise (at least 30 minutes of moderate physical activity daily).  Written and verbal instructions provided   The above assessment and management plan was discussed with the patient. The patient verbalized understanding of and has agreed to the management plan. Patient is aware to call the clinic if they develop any new symptoms or if symptoms persist or worsen. Patient is aware when to return to the clinic for a follow-up visit. Patient educated on when it is appropriate to go to the emergency department.   Neale Burly, DNP-FNP [facility] [address] [place], [place] [address] [phone]

## 2023-08-26 NOTE — Addendum Note (Signed)
 Addended by: Angela Nevin D on: 08/26/2023 04:48 PM   Modules accepted: Orders

## 2023-08-27 ENCOUNTER — Encounter: Payer: Self-pay | Admitting: Family Medicine

## 2023-08-27 NOTE — Progress Notes (Signed)
 Stable COPD and chronic bronchitis seen on exam. Recommend patient continue current regimen and follow up in one week to monitor symptoms. Patient has follow up CT scheduled. Patient to keep appt.

## 2023-09-02 ENCOUNTER — Ambulatory Visit: Admitting: Family Medicine

## 2023-09-02 ENCOUNTER — Encounter: Payer: Self-pay | Admitting: Family Medicine

## 2023-09-02 VITALS — BP 157/84 | HR 91 | Ht 70.0 in | Wt 146.0 lb

## 2023-09-02 DIAGNOSIS — J441 Chronic obstructive pulmonary disease with (acute) exacerbation: Secondary | ICD-10-CM

## 2023-09-02 MED ORDER — METHYLPREDNISOLONE ACETATE 80 MG/ML IJ SUSP
80.0000 mg | Freq: Once | INTRAMUSCULAR | Status: AC
  Administered 2023-09-02: 80 mg via INTRAMUSCULAR

## 2023-09-02 MED ORDER — PREDNISONE 20 MG PO TABS: ORAL_TABLET | ORAL | 0 refills | Status: DC

## 2023-09-02 MED ORDER — DOXYCYCLINE HYCLATE 100 MG PO TABS: 100.0000 mg | ORAL_TABLET | Freq: Two times a day (BID) | ORAL | 0 refills | Status: DC

## 2023-09-02 MED ORDER — CEFTRIAXONE SODIUM 1 G IJ SOLR
1.0000 g | Freq: Once | INTRAMUSCULAR | Status: AC
  Administered 2023-09-02: 1 g via INTRAMUSCULAR

## 2023-09-02 NOTE — Progress Notes (Signed)
 BP (!) 157/84   Pulse 91   Ht 5\' 10"  (1.778 m)   Wt 146 lb (66.2 kg)   SpO2 96%   BMI 20.95 kg/m    Subjective:   Patient ID: [name] [name], male    DOB: January 17, 1951, 73 y.o.   MRN: [id number]  HPI: [name] [name] is a 73 y.o. male presenting on 09/02/2023 for Cough (With wheezing. ) and COPD   HPI COPD and wheezing recheck Patient is still having a lot of wheezing and coughing and congestion.  It is mildly better than it was before but still not doing well.  He has not been able to go back to work because of his breathing and still not doing well.  He is still smoking but is looking to quit and he heard that there was a way to quit using some shots behind the ears.  He denies any fevers or chills or bodyaches but is still just having the coughing and wheezing both daytime and nighttime and feeling short of breath both daytime and nighttime.  He is using his Trelegy and nebulizer machine and was using Mucinex and also tried using [employer].  Relevant past medical, surgical, family and social history reviewed and updated as indicated. Interim medical history since our last visit reviewed. Allergies and medications reviewed and updated.  Review of Systems  Constitutional:  Negative for chills and fever.  HENT:  Positive for congestion, postnasal drip and rhinorrhea. Negative for ear discharge, ear pain, sinus pressure, sneezing, sore throat and voice change.   Eyes:  Negative for pain, discharge, redness and visual disturbance.  Respiratory:  Positive for cough, shortness of breath and wheezing.   Cardiovascular:  Negative for chest pain and leg swelling.  Musculoskeletal:  Negative for back pain and gait problem.  Skin:  Negative for rash.  All other systems reviewed and are negative.   Per HPI unless specifically indicated above   Allergies as of 09/02/2023       Reactions   Crestor [rosuvastatin Calcium] Other (See Comments)   Lethargy and muscle aches    Flomax [tamsulosin Hcl] Other (See Comments)   Made patient hypotensive, clammy, sweaty, and feel like he was going to pass out (EMS HAD TO BE CALLED)   Lipitor [atorvastatin Calcium] Other (See Comments)   Body cramps, lethargy and muscle aches        Medication List        Accurate as of September 02, 2023  2:25 PM. If you have any questions, ask your nurse or doctor.          albuterol 108 (90 Base) MCG/ACT inhaler Commonly known as: VENTOLIN HFA INHALE TWO PUFFS EVERY 6 HOURS AS NEEDED   albuterol (2.5 MG/3ML) 0.083% nebulizer solution Commonly known as: PROVENTIL Take 3 mLs (2.5 mg total) by nebulization every 6 (six) hours as needed for wheezing or shortness of breath.   amLODipine 5 MG tablet Commonly known as: NORVASC Take 1 tablet (5 mg total) by mouth daily.   aspirin EC 81 MG tablet Take 1 tablet (81 mg total) by mouth daily. Swallow whole.   benzonatate 100 MG capsule Commonly known as: Tessalon Perles Take 1 capsule (100 mg total) by mouth 3 (three) times daily as needed.   doxycycline 100 MG tablet Commonly known as: VIBRA-TABS Take 1 tablet (100 mg total) by mouth 2 (two) times daily. 1 po bid Started by: Elige Radon Khamora Karan   fluticasone 50 MCG/ACT nasal spray  Commonly known as: FLONASE Place 2 sprays into both nostrils daily.   guaifenesin 400 MG Tabs tablet Commonly known as: HUMIBID E Take 1 tablet (400 mg total) by mouth every 6 (six) hours as needed.   levothyroxine 50 MCG tablet Commonly known as: SYNTHROID Take 1 tablet (50 mcg total) by mouth daily.   lisinopril 20 MG tablet Commonly known as: ZESTRIL TAKE ONE TABLET DAILY   Nexlizet 180-10 MG Tabs Generic drug: Bempedoic Acid-Ezetimibe Take 1 tablet by mouth daily.   omeprazole 20 MG capsule Commonly known as: PRILOSEC Take 1 capsule by mouth daily.   predniSONE 20 MG tablet Commonly known as: DELTASONE 2 po at same time daily for 5 days Started by: Elige Radon Floyde Dingley    Trelegy Ellipta 100-62.5-25 MCG/ACT Aepb Generic drug: Fluticasone-Umeclidin-Vilant Inhale 1 puff into the lungs in the morning.         Objective:   BP (!) 157/84   Pulse 91   Ht 5\' 10"  (1.778 m)   Wt 146 lb (66.2 kg)   SpO2 96%   BMI 20.95 kg/m   Wt Readings from Last 3 Encounters:  09/02/23 146 lb (66.2 kg)  08/26/23 149 lb (67.6 kg)  08/21/23 150 lb (68 kg)    Physical Exam Vitals and nursing note reviewed.  Constitutional:      General: He is not in acute distress.    Appearance: He is well-developed. He is not diaphoretic.  Eyes:     General: No scleral icterus.    Conjunctiva/sclera: Conjunctivae normal.  Neck:     Thyroid: No thyromegaly.  Cardiovascular:     Rate and Rhythm: Normal rate and regular rhythm.     Heart sounds: Normal heart sounds. No murmur heard. Pulmonary:     Effort: Pulmonary effort is normal. No respiratory distress.     Breath sounds: Wheezing and rhonchi present. No rales.  Chest:     Chest wall: No tenderness.  Musculoskeletal:        General: Normal range of motion.     Cervical back: Neck supple.  Lymphadenopathy:     Cervical: No cervical adenopathy.  Skin:    General: Skin is warm and dry.     Findings: No rash.  Neurological:     Mental Status: He is alert and oriented to person, place, and time.     Coordination: Coordination normal.  Psychiatric:        Behavior: Behavior normal.       Assessment & Plan:   Problem List Items Addressed This Visit       Respiratory   COPD (chronic obstructive pulmonary disease) (HCC) - Primary   Relevant Medications   methylPREDNISolone acetate (DEPO-MEDROL) injection 80 mg (Start on 09/02/2023  2:30 PM)   cefTRIAXone (ROCEPHIN) injection 1 g (Start on 09/02/2023  2:30 PM)   doxycycline (VIBRA-TABS) 100 MG tablet   predniSONE (DELTASONE) 20 MG tablet    Still having COPD exacerbation, will go with something stronger, will give him Rocephin 1 g and Depo-Medrol 80 mg injection  intramuscular here in the office Instructed patient to start doxycycline and prednisone tomorrow at home. Follow up plan: Return if symptoms worsen or fail to improve.  Patient already has an appointment in 4 weeks but he will call to get 1 sooner if he still not feeling better  Counseling provided for all of the vaccine components No orders of the defined types were placed in this encounter.   Arville Care, MD [facility]  Family Medicine 09/02/2023, 2:25 PM

## 2023-09-25 ENCOUNTER — Other Ambulatory Visit: Payer: Self-pay | Admitting: Family Medicine

## 2023-10-02 ENCOUNTER — Ambulatory Visit (INDEPENDENT_AMBULATORY_CARE_PROVIDER_SITE_OTHER): Payer: PPO | Admitting: Family Medicine

## 2023-10-02 ENCOUNTER — Encounter: Payer: Self-pay | Admitting: Family Medicine

## 2023-10-02 VITALS — BP 137/76 | HR 83 | Ht 70.0 in | Wt 149.0 lb

## 2023-10-02 DIAGNOSIS — I10 Essential (primary) hypertension: Secondary | ICD-10-CM

## 2023-10-02 DIAGNOSIS — E039 Hypothyroidism, unspecified: Secondary | ICD-10-CM

## 2023-10-02 DIAGNOSIS — R399 Unspecified symptoms and signs involving the genitourinary system: Secondary | ICD-10-CM

## 2023-10-02 DIAGNOSIS — I25119 Atherosclerotic heart disease of native coronary artery with unspecified angina pectoris: Secondary | ICD-10-CM | POA: Diagnosis not present

## 2023-10-02 DIAGNOSIS — R911 Solitary pulmonary nodule: Secondary | ICD-10-CM | POA: Diagnosis not present

## 2023-10-02 DIAGNOSIS — J441 Chronic obstructive pulmonary disease with (acute) exacerbation: Secondary | ICD-10-CM | POA: Diagnosis not present

## 2023-10-02 DIAGNOSIS — E782 Mixed hyperlipidemia: Secondary | ICD-10-CM | POA: Diagnosis not present

## 2023-10-02 DIAGNOSIS — R739 Hyperglycemia, unspecified: Secondary | ICD-10-CM | POA: Diagnosis not present

## 2023-10-02 LAB — URINALYSIS, COMPLETE
Bilirubin, UA: NEGATIVE
Glucose, UA: NEGATIVE
Ketones, UA: NEGATIVE
Leukocytes,UA: NEGATIVE
Nitrite, UA: NEGATIVE
Protein,UA: NEGATIVE
RBC, UA: NEGATIVE
Specific Gravity, UA: 1.01 (ref 1.005–1.030)
Urobilinogen, Ur: 0.2 mg/dL (ref 0.2–1.0)
pH, UA: 6 (ref 5.0–7.5)

## 2023-10-02 LAB — MICROSCOPIC EXAMINATION
Bacteria, UA: NONE SEEN
Epithelial Cells (non renal): NONE SEEN /HPF (ref 0–10)
RBC, Urine: NONE SEEN /HPF (ref 0–2)
Renal Epithel, UA: NONE SEEN /HPF
Yeast, UA: NONE SEEN

## 2023-10-02 MED ORDER — LISINOPRIL 20 MG PO TABS: 20.0000 mg | ORAL_TABLET | Freq: Every day | ORAL | 3 refills | Status: DC

## 2023-10-02 NOTE — Progress Notes (Signed)
 BP 137/76   Pulse 83   Ht 5\' 10"  (1.778 m)   Wt 149 lb (67.6 kg)   SpO2 100%   BMI 21.38 kg/m    Subjective:   Patient ID: [name] [name], male    DOB: 1950/09/02, 72 y.o.   MRN: [id number]  HPI: [name] [name] is a 73 y.o. male presenting on 10/02/2023 for Medical Management of Chronic Issues, Hyperlipidemia, Hypertension, and Hypothyroidism   HPI Hypothyroidism recheck Patient is coming in for thyroid recheck today as well. They deny any issues with hair changes or heat or cold problems or diarrhea or constipation. They deny any chest pain or palpitations. They are currently on levothyroxine 50 micrograms   Hyperlipidemia and CAD recheck Patient is coming in for recheck of his hyperlipidemia. The patient is currently taking Nexlizet. They deny any issues with myalgias or history of liver damage from it. They deny any focal numbness or weakness or chest pain.   Hypertension Patient is currently on amlodipine and lisinopril, and their blood pressure today is 137/76. Patient denies any lightheadedness or dizziness. Patient denies headaches, blurred vision, chest pains, shortness of breath, or weakness. Denies any side effects from medication and is content with current medication.   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  He does not have any symptoms currently.  Patient has COPD and some cough and congestion, he is currently on inhalers.  He is still smoking currently.  He did have a new nodule noted on CT scan in January and they recommended he do another one after April 10.  It looks like it has already been ordered but we will go ahead and do a referral to pulmonology as well.  Relevant past medical, surgical, family and social history reviewed and updated as indicated. Interim medical history since our last visit reviewed. Allergies and medications reviewed and updated.  Review of  Systems  Constitutional:  Negative for chills and fever.  HENT:  Positive for congestion.   Eyes:  Negative for discharge.  Respiratory:  Positive for cough. Negative for shortness of breath and wheezing.   Cardiovascular:  Negative for chest pain and leg swelling.  Musculoskeletal:  Negative for back pain and gait problem.  Skin:  Negative for rash.  All other systems reviewed and are negative.   Per HPI unless specifically indicated above   Allergies as of 10/02/2023       Reactions   Crestor [rosuvastatin Calcium] Other (See Comments)   Lethargy and muscle aches   Flomax [tamsulosin Hcl] Other (See Comments)   Made patient hypotensive, clammy, sweaty, and feel like he was going to pass out (EMS HAD TO BE CALLED)   Lipitor [atorvastatin Calcium] Other (See Comments)   Body cramps, lethargy and muscle aches        Medication List        Accurate as of October 02, 2023  8:29 AM. If you have any questions, ask your nurse or doctor.          STOP taking these medications    benzonatate 100 MG capsule Commonly known as: Lawyer Stopped by: Elige Radon Mykael Batz   doxycycline 100 MG tablet Commonly known as: VIBRA-TABS Stopped by: Elige Radon Aniylah Avans   guaifenesin 400 MG Tabs tablet Commonly known as: HUMIBID E Stopped by: Elige Radon Bradley Handyside   predniSONE 20 MG tablet Commonly  known as: DELTASONE Stopped by: Elige Radon Donyelle Enyeart       TAKE these medications    albuterol (2.5 MG/3ML) 0.083% nebulizer solution Commonly known as: PROVENTIL Take 3 mLs (2.5 mg total) by nebulization every 6 (six) hours as needed for wheezing or shortness of breath.   albuterol 108 (90 Base) MCG/ACT inhaler Commonly known as: VENTOLIN HFA INHALE TWO PUFFS EVERY 6 HOURS AS NEEDED   amLODipine 5 MG tablet Commonly known as: NORVASC Take 1 tablet (5 mg total) by mouth daily.   aspirin EC 81 MG tablet Take 1 tablet (81 mg total) by mouth daily. Swallow whole.   fluticasone  50 MCG/ACT nasal spray Commonly known as: FLONASE Place 2 sprays into both nostrils daily.   levothyroxine 50 MCG tablet Commonly known as: SYNTHROID Take 1 tablet (50 mcg total) by mouth daily.   lisinopril 20 MG tablet Commonly known as: ZESTRIL Take 1 tablet (20 mg total) by mouth daily.   Nexlizet 180-10 MG Tabs Generic drug: Bempedoic Acid-Ezetimibe Take 1 tablet by mouth daily.   omeprazole 20 MG capsule Commonly known as: PRILOSEC Take 1 capsule by mouth daily.   Trelegy Ellipta 100-62.5-25 MCG/ACT Aepb Generic drug: Fluticasone-Umeclidin-Vilant Inhale 1 puff into the lungs in the morning.         Objective:   BP 137/76   Pulse 83   Ht 5\' 10"  (1.778 m)   Wt 149 lb (67.6 kg)   SpO2 100%   BMI 21.38 kg/m   Wt Readings from Last 3 Encounters:  10/02/23 149 lb (67.6 kg)  09/02/23 146 lb (66.2 kg)  08/26/23 149 lb (67.6 kg)    Physical Exam Vitals and nursing note reviewed.  Constitutional:      General: He is not in acute distress.    Appearance: He is well-developed. He is not diaphoretic.  Eyes:     General: No scleral icterus.    Conjunctiva/sclera: Conjunctivae normal.  Neck:     Thyroid: No thyromegaly.  Cardiovascular:     Rate and Rhythm: Normal rate and regular rhythm.     Heart sounds: Normal heart sounds. No murmur heard. Pulmonary:     Effort: Pulmonary effort is normal. No respiratory distress.     Breath sounds: Normal breath sounds. No wheezing.  Musculoskeletal:        General: No swelling. Normal range of motion.     Cervical back: Neck supple.  Lymphadenopathy:     Cervical: No cervical adenopathy.  Skin:    General: Skin is warm and dry.     Findings: No rash.  Neurological:     Mental Status: He is alert and oriented to person, place, and time.     Coordination: Coordination normal.  Psychiatric:        Behavior: Behavior normal.       Assessment & Plan:   Problem List Items Addressed This Visit        Cardiovascular and Mediastinum   Essential hypertension   Relevant Medications   lisinopril (ZESTRIL) 20 MG tablet   Other Relevant Orders   CBC with Differential/Platelet   CMP14+EGFR   Lipid panel   Coronary artery disease involving native coronary artery of native heart with angina pectoris (HCC)   Relevant Medications   lisinopril (ZESTRIL) 20 MG tablet     Respiratory   COPD (chronic obstructive pulmonary disease) (HCC)   Relevant Orders   Ambulatory referral to Pulmonology     Endocrine   Hypothyroidism  Relevant Orders   TSH     Other   HLD (hyperlipidemia)   Relevant Medications   lisinopril (ZESTRIL) 20 MG tablet   Other Relevant Orders   Lipid panel   Other Visit Diagnoses       UTI symptoms    -  Primary   Relevant Orders   Urinalysis, Complete   Urine Culture     Lung nodule seen on imaging study       Relevant Orders   Ambulatory referral to Pulmonology       Will refer to pulmonology for COPD and follow-up lung nodules. Follow up plan: Return in about 6 months (around 04/02/2024), or if symptoms worsen or fail to improve, for Thyroid and hypertension.  Counseling provided for all of the vaccine components Orders Placed This Encounter  Procedures   Urine Culture   CBC with Differential/Platelet   CMP14+EGFR   Lipid panel   TSH   Urinalysis, Complete   Ambulatory referral to Pulmonology    Arville Care, MD [facility] 10/02/2023, 8:29 AM

## 2023-10-03 LAB — CMP14+EGFR
ALT: 14 IU/L (ref 0–44)
AST: 19 IU/L (ref 0–40)
Albumin: 4.3 g/dL (ref 3.8–4.8)
Alkaline Phosphatase: 113 IU/L (ref 44–121)
BUN/Creatinine Ratio: 13 (ref 10–24)
BUN: 17 mg/dL (ref 8–27)
Bilirubin Total: 0.5 mg/dL (ref 0.0–1.2)
CO2: 22 mmol/L (ref 20–29)
Calcium: 10 mg/dL (ref 8.6–10.2)
Chloride: 100 mmol/L (ref 96–106)
Creatinine, Ser: 1.33 mg/dL — ABNORMAL HIGH (ref 0.76–1.27)
Globulin, Total: 2.6 g/dL (ref 1.5–4.5)
Glucose: 111 mg/dL — ABNORMAL HIGH (ref 70–99)
Potassium: 5.1 mmol/L (ref 3.5–5.2)
Sodium: 141 mmol/L (ref 134–144)
Total Protein: 6.9 g/dL (ref 6.0–8.5)
eGFR: 57 mL/min/{1.73_m2} — ABNORMAL LOW (ref >59)

## 2023-10-03 LAB — CBC WITH DIFFERENTIAL/PLATELET
Basophils Absolute: 0 10*3/uL (ref 0.0–0.2)
Basos: 1 %
EOS (ABSOLUTE): 0.1 10*3/uL (ref 0.0–0.4)
Eos: 2 %
Hematocrit: 46.8 % (ref 37.5–51.0)
Hemoglobin: 15.4 g/dL (ref 13.0–17.7)
Immature Grans (Abs): 0 10*3/uL (ref 0.0–0.1)
Immature Granulocytes: 0 %
Lymphocytes Absolute: 3.1 10*3/uL (ref 0.7–3.1)
Lymphs: 40 %
MCH: 30 pg (ref 26.6–33.0)
MCHC: 32.9 g/dL (ref 31.5–35.7)
MCV: 91 fL (ref 79–97)
Monocytes Absolute: 0.8 10*3/uL (ref 0.1–0.9)
Monocytes: 11 %
Neutrophils Absolute: 3.6 10*3/uL (ref 1.4–7.0)
Neutrophils: 46 %
Platelets: 240 10*3/uL (ref 150–450)
RBC: 5.13 x10E6/uL (ref 4.14–5.80)
RDW: 12.4 % (ref 11.6–15.4)
WBC: 7.7 10*3/uL (ref 3.4–10.8)

## 2023-10-03 LAB — LIPID PANEL
Chol/HDL Ratio: 2.5 ratio (ref 0.0–5.0)
Cholesterol, Total: 126 mg/dL (ref 100–199)
HDL: 51 mg/dL (ref >39)
LDL Chol Calc (NIH): 61 mg/dL (ref 0–99)
Triglycerides: 67 mg/dL (ref 0–149)
VLDL Cholesterol Cal: 14 mg/dL (ref 5–40)

## 2023-10-03 LAB — TSH: TSH: 1.92 u[IU]/mL (ref 0.450–4.500)

## 2023-10-04 LAB — URINE CULTURE: Organism ID, Bacteria: NO GROWTH

## 2023-10-06 ENCOUNTER — Other Ambulatory Visit: Payer: Self-pay

## 2023-10-06 ENCOUNTER — Encounter: Payer: Self-pay | Admitting: Family Medicine

## 2023-10-06 DIAGNOSIS — N289 Disorder of kidney and ureter, unspecified: Secondary | ICD-10-CM

## 2023-10-06 NOTE — Progress Notes (Signed)
 Can we add on A1C for elevated glucose? No growth on culture. Cholesterol well controlled. Continue on nexilzet. Would like patient to repeat CMP in 1 week for slightly decreased GFR and elevated creatinine. Increase hydration to 80-100 oz of water daily. Avoid use of NSAIDs and other nephrotoxic medications.

## 2023-10-07 LAB — HGB A1C W/O EAG: Hgb A1c MFr Bld: 6.1 % — ABNORMAL HIGH (ref 4.8–5.6)

## 2023-10-07 LAB — SPECIMEN STATUS REPORT

## 2023-10-08 ENCOUNTER — Other Ambulatory Visit

## 2023-10-08 DIAGNOSIS — N289 Disorder of kidney and ureter, unspecified: Secondary | ICD-10-CM | POA: Diagnosis not present

## 2023-10-09 LAB — CMP14+EGFR
ALT: 12 IU/L (ref 0–44)
AST: 20 IU/L (ref 0–40)
Albumin: 4.1 g/dL (ref 3.8–4.8)
Alkaline Phosphatase: 105 IU/L (ref 44–121)
BUN/Creatinine Ratio: 16 (ref 10–24)
BUN: 19 mg/dL (ref 8–27)
Bilirubin Total: 0.4 mg/dL (ref 0.0–1.2)
CO2: 23 mmol/L (ref 20–29)
Calcium: 9.7 mg/dL (ref 8.6–10.2)
Chloride: 99 mmol/L (ref 96–106)
Creatinine, Ser: 1.2 mg/dL (ref 0.76–1.27)
Globulin, Total: 2.5 g/dL (ref 1.5–4.5)
Glucose: 91 mg/dL (ref 70–99)
Potassium: 4.7 mmol/L (ref 3.5–5.2)
Sodium: 137 mmol/L (ref 134–144)
Total Protein: 6.6 g/dL (ref 6.0–8.5)
eGFR: 64 mL/min/{1.73_m2} (ref >59)

## 2023-10-12 ENCOUNTER — Encounter: Payer: Self-pay | Admitting: Family Medicine

## 2023-10-16 ENCOUNTER — Ambulatory Visit (HOSPITAL_COMMUNITY)
Admission: RE | Admit: 2023-10-16 | Discharge: 2023-10-16 | Disposition: A | Discharge status: Home / Self Care | Source: Ambulatory Visit | Attending: Acute Care | Admitting: Acute Care

## 2023-10-16 DIAGNOSIS — Z87891 Personal history of nicotine dependence: Secondary | ICD-10-CM | POA: Insufficient documentation

## 2023-10-16 DIAGNOSIS — Z122 Encounter for screening for malignant neoplasm of respiratory organs: Secondary | ICD-10-CM | POA: Insufficient documentation

## 2023-10-16 DIAGNOSIS — F17210 Nicotine dependence, cigarettes, uncomplicated: Secondary | ICD-10-CM | POA: Insufficient documentation

## 2023-10-16 DIAGNOSIS — R911 Solitary pulmonary nodule: Secondary | ICD-10-CM | POA: Insufficient documentation

## 2023-10-16 DIAGNOSIS — R918 Other nonspecific abnormal finding of lung field: Secondary | ICD-10-CM | POA: Diagnosis not present

## 2023-10-16 DIAGNOSIS — J439 Emphysema, unspecified: Secondary | ICD-10-CM | POA: Diagnosis not present

## 2023-10-21 ENCOUNTER — Other Ambulatory Visit: Payer: Self-pay | Admitting: Family Medicine

## 2023-10-28 ENCOUNTER — Encounter (HOSPITAL_BASED_OUTPATIENT_CLINIC_OR_DEPARTMENT_OTHER): Payer: Self-pay

## 2023-10-29 ENCOUNTER — Telehealth: Payer: Self-pay

## 2023-10-29 NOTE — Telephone Encounter (Signed)
 Copied from CRM [id number]. Topic: Clinical - Lab/Test Results >> Oct 28, 2023  4:13 PM Justina Oman C wrote: Reason for CRM: Patient [phone] states NP, Margit Shelling ordered CT chest scan, patient had it done on 10/16/23, and has not heard of the test results. Please advise and call back.

## 2023-11-12 ENCOUNTER — Telehealth: Payer: Self-pay

## 2023-11-12 ENCOUNTER — Other Ambulatory Visit: Payer: Self-pay

## 2023-11-12 DIAGNOSIS — Z87891 Personal history of nicotine dependence: Secondary | ICD-10-CM

## 2023-11-12 DIAGNOSIS — Z122 Encounter for screening for malignant neoplasm of respiratory organs: Secondary | ICD-10-CM

## 2023-11-12 DIAGNOSIS — R911 Solitary pulmonary nodule: Secondary | ICD-10-CM

## 2023-11-12 NOTE — Telephone Encounter (Signed)
 I have called and spoke with the patient. Reviewed results below. He is in agreement to repeat scan in 6 months to evaluate new nodule. 6 month order placed and scheduled for 04/15/2024 at [facility]. Results and plan to PCP.

## 2023-11-12 NOTE — Telephone Encounter (Signed)
 Recent Lung CT results have been reviewed by Margit Shelling, NP. She is in agreement to repeat scan in 6 months to evaluate new 5.5 mm nodule since previous scan. Previous nodules of concern have shrank or resolved. Please place 6 month order, due 10/24/20225. Send results and plan to PCP.   IMPRESSION: 1. Lung-RADS 3, probably benign findings. Short-term follow-up in 6 months is recommended with repeat low-dose chest CT without contrast (please use the following order, "CT CHEST LCS NODULE FOLLOW-UP W/O CM"). New nodule in the left upper lobe with mean diameter of 5.5 mm. 2. The pulmonary nodules that were new on prior exam diminished and near completely resolved.   Aortic Atherosclerosis (ICD10-I70.0) and Emphysema (ICD10-J43.9).

## 2023-11-26 ENCOUNTER — Other Ambulatory Visit: Payer: Self-pay | Admitting: Family Medicine

## 2023-11-27 DIAGNOSIS — H2513 Age-related nuclear cataract, bilateral: Secondary | ICD-10-CM | POA: Diagnosis not present

## 2023-11-27 DIAGNOSIS — H40033 Anatomical narrow angle, bilateral: Secondary | ICD-10-CM | POA: Diagnosis not present

## 2023-12-21 ENCOUNTER — Other Ambulatory Visit: Payer: Self-pay | Admitting: Family Medicine

## 2023-12-21 DIAGNOSIS — E782 Mixed hyperlipidemia: Secondary | ICD-10-CM

## 2023-12-21 DIAGNOSIS — I25119 Atherosclerotic heart disease of native coronary artery with unspecified angina pectoris: Secondary | ICD-10-CM

## 2023-12-31 ENCOUNTER — Encounter: Payer: Self-pay | Admitting: Family Medicine

## 2023-12-31 ENCOUNTER — Observation Stay (HOSPITAL_COMMUNITY)
Admission: EM | Admit: 2023-12-31 | Discharge: 2024-01-01 | Disposition: A | Discharge status: Home / Self Care | Attending: Internal Medicine | Admitting: Internal Medicine

## 2023-12-31 ENCOUNTER — Ambulatory Visit: Admitting: Family Medicine

## 2023-12-31 ENCOUNTER — Emergency Department (HOSPITAL_COMMUNITY)

## 2023-12-31 ENCOUNTER — Other Ambulatory Visit: Payer: Self-pay

## 2023-12-31 ENCOUNTER — Encounter (HOSPITAL_COMMUNITY): Payer: Self-pay | Admitting: Emergency Medicine

## 2023-12-31 VITALS — BP 186/84 | HR 87

## 2023-12-31 DIAGNOSIS — R918 Other nonspecific abnormal finding of lung field: Secondary | ICD-10-CM | POA: Diagnosis not present

## 2023-12-31 DIAGNOSIS — R079 Chest pain, unspecified: Principal | ICD-10-CM | POA: Diagnosis present

## 2023-12-31 DIAGNOSIS — F17210 Nicotine dependence, cigarettes, uncomplicated: Secondary | ICD-10-CM | POA: Insufficient documentation

## 2023-12-31 DIAGNOSIS — E039 Hypothyroidism, unspecified: Secondary | ICD-10-CM | POA: Diagnosis not present

## 2023-12-31 DIAGNOSIS — R0789 Other chest pain: Principal | ICD-10-CM | POA: Insufficient documentation

## 2023-12-31 DIAGNOSIS — Z7982 Long term (current) use of aspirin: Secondary | ICD-10-CM | POA: Diagnosis not present

## 2023-12-31 DIAGNOSIS — I10 Essential (primary) hypertension: Secondary | ICD-10-CM | POA: Diagnosis not present

## 2023-12-31 DIAGNOSIS — I2510 Atherosclerotic heart disease of native coronary artery without angina pectoris: Secondary | ICD-10-CM | POA: Insufficient documentation

## 2023-12-31 DIAGNOSIS — J449 Chronic obstructive pulmonary disease, unspecified: Secondary | ICD-10-CM | POA: Insufficient documentation

## 2023-12-31 DIAGNOSIS — R0689 Other abnormalities of breathing: Secondary | ICD-10-CM | POA: Diagnosis not present

## 2023-12-31 DIAGNOSIS — Z8616 Personal history of COVID-19: Secondary | ICD-10-CM | POA: Diagnosis not present

## 2023-12-31 DIAGNOSIS — Z720 Tobacco use: Secondary | ICD-10-CM | POA: Diagnosis not present

## 2023-12-31 DIAGNOSIS — Z79899 Other long term (current) drug therapy: Secondary | ICD-10-CM | POA: Diagnosis not present

## 2023-12-31 DIAGNOSIS — I493 Ventricular premature depolarization: Secondary | ICD-10-CM | POA: Diagnosis not present

## 2023-12-31 DIAGNOSIS — E785 Hyperlipidemia, unspecified: Secondary | ICD-10-CM | POA: Diagnosis not present

## 2023-12-31 LAB — COMPREHENSIVE METABOLIC PANEL WITH GFR
ALT: 10 U/L (ref 0–44)
AST: 15 U/L (ref 15–41)
Albumin: 3.3 g/dL — ABNORMAL LOW (ref 3.5–5.0)
Alkaline Phosphatase: 84 U/L (ref 38–126)
Anion gap: 11 (ref 5–15)
BUN: 13 mg/dL (ref 8–23)
CO2: 25 mmol/L (ref 22–32)
Calcium: 9.1 mg/dL (ref 8.9–10.3)
Chloride: 105 mmol/L (ref 98–111)
Creatinine, Ser: 1.03 mg/dL (ref 0.61–1.24)
GFR, Estimated: >60 mL/min (ref >60)
Glucose, Bld: 99 mg/dL (ref 70–99)
Potassium: 4.7 mmol/L (ref 3.5–5.1)
Sodium: 141 mmol/L (ref 135–145)
Total Bilirubin: 0.7 mg/dL (ref 0.0–1.2)
Total Protein: 6.6 g/dL (ref 6.5–8.1)

## 2023-12-31 LAB — CBC WITH DIFFERENTIAL/PLATELET
Abs Immature Granulocytes: 0.02 K/uL (ref 0.00–0.07)
Basophils Absolute: 0 K/uL (ref 0.0–0.1)
Basophils Relative: 1 %
Eosinophils Absolute: 0.2 K/uL (ref 0.0–0.5)
Eosinophils Relative: 2 %
HCT: 45.1 % (ref 39.0–52.0)
Hemoglobin: 15.1 g/dL (ref 13.0–17.0)
Immature Granulocytes: 0 %
Lymphocytes Relative: 33 %
Lymphs Abs: 2.8 K/uL (ref 0.7–4.0)
MCH: 31.6 pg (ref 26.0–34.0)
MCHC: 33.5 g/dL (ref 30.0–36.0)
MCV: 94.4 fL (ref 80.0–100.0)
Monocytes Absolute: 0.9 K/uL (ref 0.1–1.0)
Monocytes Relative: 10 %
Neutro Abs: 4.7 K/uL (ref 1.7–7.7)
Neutrophils Relative %: 54 %
Platelets: 231 K/uL (ref 150–400)
RBC: 4.78 MIL/uL (ref 4.22–5.81)
RDW: 12.6 % (ref 11.5–15.5)
WBC: 8.6 K/uL (ref 4.0–10.5)
nRBC: 0 % (ref 0.0–0.2)

## 2023-12-31 LAB — TROPONIN I (HIGH SENSITIVITY)
Troponin I (High Sensitivity): 7 ng/L (ref <18)
Troponin I (High Sensitivity): 7 ng/L (ref <18)
Troponin I (High Sensitivity): 7 ng/L (ref <18)
Troponin I (High Sensitivity): 8 ng/L (ref <18)

## 2023-12-31 MED ORDER — ENSURE PLUS HIGH PROTEIN PO LIQD
237.0000 mL | Freq: Two times a day (BID) | ORAL | Status: DC
  Administered 2024-01-01: 237 mL via ORAL

## 2023-12-31 MED ORDER — BEMPEDOIC ACID-EZETIMIBE 180-10 MG PO TABS: 1.0000 | ORAL_TABLET | Freq: Every day | ORAL | Status: DC

## 2023-12-31 MED ORDER — BUDESON-GLYCOPYRROL-FORMOTEROL 160-9-4.8 MCG/ACT IN AERO
2.0000 | INHALATION_SPRAY | Freq: Two times a day (BID) | RESPIRATORY_TRACT | Status: DC
  Filled 2023-12-31: qty 5.9

## 2023-12-31 MED ORDER — ALBUTEROL SULFATE (2.5 MG/3ML) 0.083% IN NEBU: 2.5000 mg | INHALATION_SOLUTION | Freq: Four times a day (QID) | RESPIRATORY_TRACT | Status: DC | PRN

## 2023-12-31 MED ORDER — HEPARIN SODIUM (PORCINE) 5000 UNIT/ML IJ SOLN
5000.0000 [IU] | Freq: Three times a day (TID) | INTRAMUSCULAR | Status: DC
  Administered 2024-01-01: 5000 [IU] via SUBCUTANEOUS
  Filled 2023-12-31: qty 1

## 2023-12-31 MED ORDER — ISOSORBIDE MONONITRATE ER 30 MG PO TB24
30.0000 mg | ORAL_TABLET | Freq: Every day | ORAL | Status: DC
  Administered 2023-12-31 – 2024-01-01 (×2): 30 mg via ORAL
  Filled 2023-12-31 (×2): qty 1

## 2023-12-31 MED ORDER — LISINOPRIL 10 MG PO TABS
20.0000 mg | ORAL_TABLET | Freq: Every day | ORAL | Status: DC
  Administered 2024-01-01: 20 mg via ORAL
  Filled 2023-12-31: qty 2

## 2023-12-31 MED ORDER — ACETAMINOPHEN 325 MG PO TABS: 650.0000 mg | ORAL_TABLET | ORAL | Status: DC | PRN

## 2023-12-31 MED ORDER — ASPIRIN 81 MG PO TBEC
81.0000 mg | DELAYED_RELEASE_TABLET | Freq: Every day | ORAL | Status: DC
  Administered 2024-01-01: 81 mg via ORAL
  Filled 2023-12-31: qty 1

## 2023-12-31 MED ORDER — ONDANSETRON HCL 4 MG/2ML IJ SOLN: 4.0000 mg | Freq: Four times a day (QID) | INTRAMUSCULAR | Status: DC | PRN

## 2023-12-31 MED ORDER — MORPHINE SULFATE (PF) 2 MG/ML IV SOLN
2.0000 mg | Freq: Once | INTRAVENOUS | Status: AC
  Administered 2023-12-31: 2 mg via INTRAVENOUS
  Filled 2023-12-31: qty 1

## 2023-12-31 MED ORDER — PANTOPRAZOLE SODIUM 40 MG PO TBEC
40.0000 mg | DELAYED_RELEASE_TABLET | Freq: Every day | ORAL | Status: DC
  Administered 2024-01-01: 40 mg via ORAL
  Filled 2023-12-31: qty 1

## 2023-12-31 MED ORDER — LEVOTHYROXINE SODIUM 50 MCG PO TABS
50.0000 ug | ORAL_TABLET | Freq: Every day | ORAL | Status: DC
  Administered 2024-01-01: 50 ug via ORAL
  Filled 2023-12-31: qty 1

## 2023-12-31 MED ORDER — NICOTINE 21 MG/24HR TD PT24
21.0000 mg | MEDICATED_PATCH | Freq: Every day | TRANSDERMAL | Status: DC
  Administered 2023-12-31 – 2024-01-01 (×2): 21 mg via TRANSDERMAL
  Filled 2023-12-31 (×2): qty 1

## 2023-12-31 MED ORDER — NITROGLYCERIN 0.4 MG SL SUBL
0.4000 mg | SUBLINGUAL_TABLET | Freq: Once | SUBLINGUAL | Status: AC
  Administered 2023-12-31: 0.4 mg via SUBLINGUAL
  Filled 2023-12-31: qty 1

## 2023-12-31 MED ORDER — FLUTICASONE PROPIONATE 50 MCG/ACT NA SUSP
2.0000 | Freq: Every day | NASAL | Status: DC
  Administered 2024-01-01: 2 via NASAL
  Filled 2023-12-31: qty 16

## 2023-12-31 MED ORDER — SODIUM CHLORIDE 0.9 % IV BOLUS
500.0000 mL | Freq: Once | INTRAVENOUS | Status: AC
  Administered 2023-12-31: 500 mL via INTRAVENOUS

## 2023-12-31 NOTE — Plan of Care (Signed)

## 2023-12-31 NOTE — ED Triage Notes (Addendum)
 Pt bib rcems from [facility]. Pt c/o of chest pain and left arm numbness that started at 0300 this morning. Pcp office gave pt 324mg  asa and pt states it helped w/ his pain.

## 2023-12-31 NOTE — ED Notes (Signed)
 ED TO INPATIENT HANDOFF REPORT  ED Nurse Name and Phone #: [phone]  S Name/Age/Gender [name] [name] 73 y.o. male Room/Bed: APA06/APA06  Code Status   Code Status: Prior  Home/SNF/Other Home Patient oriented to: self, place, time, and situation Is this baseline? Yes   Triage Complete: Triage complete  Chief Complaint Chest pain [R07.9]  Triage Note Pt bib rcems from [facility]. Pt c/o of chest pain and left arm numbness that started at 0300 this morning. Pcp office gave pt 324mg  asa and pt states it helped w/ his pain.     Allergies Allergies  Allergen Reactions   Crestor [Rosuvastatin Calcium ] Other (See Comments)    Lethargy and muscle aches   Flomax  [Tamsulosin  Hcl] Other (See Comments)    Made patient hypotensive, clammy, sweaty, and feel like he was going to pass out (EMS HAD TO BE CALLED)   Lipitor [Atorvastatin Calcium ] Other (See Comments)    Body cramps, lethargy and muscle aches    Level of Care/Admitting Diagnosis ED Disposition     ED Disposition  Admit   Condition  --   Comment  Hospital Area: [facility] [[id number]]  Level of Care: Telemetry [5]  Covid Evaluation: Asymptomatic - no recent exposure (last 10 days) testing not required  Diagnosis: Chest pain [255200]  Admitting Physician: VANN, JESSICA U [[id number]]  Attending Physician: VANN, JESSICA U [[id number]]  For patients discharging to extended facilities (i.e. SNF, AL, group homes or LTAC) initiate:: Discharge to SNF/Facility Placement COVID-19 Lab Testing Protocol          B Medical/Surgery History Past Medical History:  Diagnosis Date   Arthritis    Carotid artery stenosis without cerebral infarction    vascular --- dr eliza;   09-24-2007  s/p right CEA   Chronic cholecystitis    w/  large calcified stone in gallbladder   Chronic constipation    COPD (chronic obstructive pulmonary disease) (HCC)    followed by pcp----  (05-28-2021  pt stated last used rescue  05-27-2021 for wheezing resolved after use, stated exacerbation approx  greater than a year ago)   Coronary artery disease 2002   cardiologist-- dr lavona ;  (05-28-2021  pt denies need to use nitro for CP) per last cath 08-04-2017  LM luminal irregularities, LAD 30% ,D1 ostial 35% ,CFx   AV groove ostial 25% , OM1 long prox 30%,  proxRCA occluded and dominant,   excellent left to right collateral filling.   Depression    Patient denies   DOE (dyspnea on exertion)    due to smoking/ copd   Dyslipidemia    Essential hypertension    Full dentures    GERD (gastroesophageal reflux disease)    History of adenomatous polyp of colon    History of COVID-19    per pt winter 2022, mild to moderate symptoms that resolved   History of diverticulitis of colon    History of kidney stones    Hyperlipidemia    Hypothyroidism    followed by pcp   Lung nodule    s/p bronchoscopy w/ bx in 2005,  benign   Osteoporosis    Smokers' cough (HCC)    05-28-2021  pt stated occasionally productive w/ white phlegm   Wears glasses    Past Surgical History:  Procedure Laterality Date   BRONCHOSCOPY  2005   CARDIAC CATHETERIZATION  08/11/2000   @[facility]  by dr morris;   preserved LVF,  ostial RCA 70-75%, other nonobstructive disease  CARDIAC CATHETERIZATION  03/30/2008   @[facility]  by dr hochrein;  occluded RCA and other nonobstructive involving LAD, CFx, D1,OM1  and left to right collateral filling,  mild inferior hypokinesis,  ef 55%   CAROTID ENDARTERECTOMY Right 09/24/2007   @[facility]  by dr eliza   COLONOSCOPY  01/19/2017   by armsbruster   EXCISION NEUROMA Left 10/01/2005   @[facility] ;  left inguinal   EXTRACORPOREAL SHOCK WAVE LITHOTRIPSY  2015   INGUINAL HERNIA REPAIR Left    01-13-2003 and 07-22-2006 both @[facility]    LAPAROSCOPIC CHOLECYSTECTOMY SINGLE SITE WITH INTRAOPERATIVE CHOLANGIOGRAM N/A 05/30/2021   Procedure: LAPAROSCOPIC CHOLECYSTECTOMY SINGLE SITE WITH INTRAOPERATIVE CHOLANGIOGRAM;  Surgeon: Sheldon Standing, MD;   Location: [facility];  Service: General;  Laterality: N/A;   LAPAROSCOPIC INGUINAL HERNIA REPAIR Bilateral 04/28/2007   @[facility]    LEFT HEART CATH AND CORONARY ANGIOGRAPHY N/A 08/04/2017   Procedure: LEFT HEART CATH AND CORONARY ANGIOGRAPHY;  Surgeon: Verlin Lonni BIRCH, MD;  Location: [facility] INVASIVE CV [facility];  Service: Cardiovascular;  Laterality: N/A;   THROAT SURGERY  1997   pt stated removal of tumor that was benign     A IV Location/Drains/Wounds Patient Lines/Drains/Airways Status     Active Line/Drains/Airways     Name Placement date Placement time Site Days   Peripheral IV 12/31/23 18 G 1 Anterior;Distal;Right;Upper Arm 12/31/23  --  Arm  less than 1   Incision - 1 Port Abdomen 1: Umbilicus 05/30/21  1000  -- 945            Intake/Output Last 24 hours No intake or output data in the 24 hours ending 12/31/23 1245  Labs/Imaging Results for orders placed or performed during the hospital encounter of 12/31/23 (from the past 48 hours)  CBC with Differential     Status: None   Collection Time: 12/31/23 10:29 AM  Result Value Ref Range   WBC 8.6 4.0 - 10.5 K/uL   RBC 4.78 4.22 - 5.81 MIL/uL   Hemoglobin 15.1 13.0 - 17.0 g/dL   HCT 54.8 60.9 - 47.9 %   MCV 94.4 80.0 - 100.0 fL   MCH 31.6 26.0 - 34.0 pg   MCHC 33.5 30.0 - 36.0 g/dL   RDW 87.3 88.4 - 84.4 %   Platelets 231 150 - 400 K/uL   nRBC 0.0 0.0 - 0.2 %   Neutrophils Relative % 54 %   Neutro Abs 4.7 1.7 - 7.7 K/uL   Lymphocytes Relative 33 %   Lymphs Abs 2.8 0.7 - 4.0 K/uL   Monocytes Relative 10 %   Monocytes Absolute 0.9 0.1 - 1.0 K/uL   Eosinophils Relative 2 %   Eosinophils Absolute 0.2 0.0 - 0.5 K/uL   Basophils Relative 1 %   Basophils Absolute 0.0 0.0 - 0.1 K/uL   Immature Granulocytes 0 %   Abs Immature Granulocytes 0.02 0.00 - 0.07 K/uL    Comment: Performed at [facility], [address] [place], [place] [address]  Comprehensive metabolic panel     Status: Abnormal   Collection  Time: 12/31/23 10:29 AM  Result Value Ref Range   Sodium 141 135 - 145 mmol/L   Potassium 4.7 3.5 - 5.1 mmol/L   Chloride 105 98 - 111 mmol/L   CO2 25 22 - 32 mmol/L   Glucose, Bld 99 70 - 99 mg/dL    Comment: Glucose reference range applies only to samples taken after fasting for at least 8 hours.   BUN 13 8 - 23 mg/dL  Creatinine, Ser 1.03 0.61 - 1.24 mg/dL   Calcium  9.1 8.9 - 10.3 mg/dL   Total Protein 6.6 6.5 - 8.1 g/dL   Albumin 3.3 (L) 3.5 - 5.0 g/dL   AST 15 15 - 41 U/L   ALT 10 0 - 44 U/L   Alkaline Phosphatase 84 38 - 126 U/L   Total Bilirubin 0.7 0.0 - 1.2 mg/dL   GFR, Estimated >39 >39 mL/min    Comment: (NOTE) Calculated using the CKD-EPI Creatinine Equation (2021)    Anion gap 11 5 - 15    Comment: Performed at [facility], [address], [place], [place] [address]  Troponin I (High Sensitivity)     Status: None   Collection Time: 12/31/23 10:29 AM  Result Value Ref Range   Troponin I (High Sensitivity) 7 <18 ng/L    Comment: (NOTE) Elevated high sensitivity troponin I (hsTnI) values and significant  changes across serial measurements may suggest ACS but many other  chronic and acute conditions are known to elevate hsTnI results.  Refer to the Links section for chest pain algorithms and additional  guidance. Performed at [facility], [address], [place], [place] [address]    DG Chest Port 1 View Result Date: 12/31/2023 CLINICAL DATA:  Chest pain EXAM: PORTABLE CHEST 1 VIEW COMPARISON:  Chest radiograph dated 08/26/2023 FINDINGS: Normal lung volumes. Biapical pleural-parenchymal scarring. Suggestion of spiculated nodule in the right apex. Unchanged calcified nodule in the right mid lung. No pleural effusion or pneumothorax. The heart size and mediastinal contours are within normal limits. No acute osseous abnormality. IMPRESSION: 1. No focal consolidations. 2. Suggestion of spiculated nodule in the right apex, which may reflect superimposition of  pleuroparenchymal scarring. This area can be further evaluated on recommended follow-up lung cancer screening CT. Electronically Signed   By: Limin  Xu M.D.   On: 12/31/2023 10:08    Pending Labs Unresulted Labs (From admission, onward)    None       Vitals/Pain Today's Vitals   12/31/23 1036 12/31/23 1130 12/31/23 1215 12/31/23 1230  BP:  116/66 (!) 153/67 132/70  Pulse:  (!) 53 61 60  Resp:  16 (!) 22 17  Temp:      TempSrc:      SpO2:  95% 97% 96%  Weight:      Height:      PainSc: 0-No pain       Isolation Precautions No active isolations  Medications Medications  sodium chloride  0.9 % bolus 500 mL (500 mLs Intravenous Bolus from Bag 12/31/23 1012)  morphine  (PF) 2 MG/ML injection 2 mg (2 mg Intravenous Given 12/31/23 1011)  nitroGLYCERIN  (NITROSTAT ) SL tablet 0.4 mg (0.4 mg Sublingual Given 12/31/23 1011)    Mobility walks     Focused Assessments    R Recommendations: See Admitting Provider Note  Report given to:   Additional Notes:

## 2023-12-31 NOTE — Consult Note (Addendum)
 CARDIOLOGY CONSULT NOTE    Patient ID: [name] [name]; [id number]; 01-18-1951   Admit date: 12/31/2023 Date of Consult: 12/31/2023  Primary Care Provider: Dettinger, [name] LABOR, MD Primary Cardiologist:  Primary Electrophysiologist:     History of Present Illness:   [name] [name] is a 73 year old M known to have CAD (CTO RCA since 2009 with moderate CAD elsewhere) with normal LVEF, COPD, nicotine  abuse was sent from PCPs office for evaluation of chest pressure/tightness and left arm numbness.  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  He woke up at 3 AM after which he noticed chest pressure/tightness in the left side of his chest associated with left arm numbness.  He went to his PCPs office today who sent him to the ER.  Chest pressure/tightness resolved this morning but the left arm numbness has been intermittent since then.  EKG in the ER showed normal sinus rhythm, T wave inversions in the inferior leads and anterolateral leads, frequent PVCs in the pattern of ventricular bigeminy. Hs troponins within normal limits, 7>>7.  He does not report any palpitations, DOE or fatigue.  He was having chest pressure for a long time but this was always at rest and never with exertion.  He does yard work, takes care of the grass, lifts weights etc. with no symptoms of chest pressure/tightness.  Past Medical History:  Diagnosis Date   Arthritis    Carotid artery stenosis without cerebral infarction    vascular --- dr [name] [name];   09-24-2007  s/p right CEA   Chronic cholecystitis    w/  large calcified stone in gallbladder   Chronic constipation    COPD (chronic obstructive pulmonary disease) (HCC)    followed by pcp----  (05-28-2021  pt stated last used rescue 05-27-2021 for wheezing resolved after use, stated exacerbation approx  greater than a year ago)   Coronary artery disease 2002   cardiologist-- dr [name] [name] ;  (05-28-2021  pt denies need to use nitro for CP) per last cath  08-04-2017  LM luminal irregularities, LAD 30% ,D1 ostial 35% ,CFx   AV groove ostial 25% , OM1 long prox 30%,  proxRCA occluded and dominant,   excellent left to right collateral filling.   Depression    Patient denies   DOE (dyspnea on exertion)    due to smoking/ copd   Dyslipidemia    Essential hypertension    Full dentures    GERD (gastroesophageal reflux disease)    History of adenomatous polyp of colon    History of COVID-19    per pt winter 2022, mild to moderate symptoms that resolved   History of diverticulitis of colon    History of kidney stones    Hyperlipidemia    Hypothyroidism    followed by pcp   Lung nodule    s/p bronchoscopy w/ bx in 2005,  benign   Osteoporosis    Smokers' cough (HCC)    05-28-2021  pt stated occasionally productive w/ white phlegm   Wears glasses     Past Surgical History:  Procedure Laterality Date   BRONCHOSCOPY  2005   CARDIAC CATHETERIZATION  08/11/2000   @[facility]  by dr [name] [name];   preserved LVF,  ostial RCA 70-75%, other nonobstructive disease   CARDIAC CATHETERIZATION  03/30/2008   @[facility]  by dr [name] [name];  occluded RCA and other nonobstructive involving LAD, CFx, D1,OM1  and left to right collateral filling,  mild inferior hypokinesis,  ef 55%  CAROTID ENDARTERECTOMY Right 09/24/2007   @[facility]  by dr [name] [name]   COLONOSCOPY  01/19/2017   by [name] [name]   EXCISION NEUROMA Left 10/01/2005   @[facility] ;  left inguinal   EXTRACORPOREAL SHOCK WAVE LITHOTRIPSY  2015   INGUINAL HERNIA REPAIR Left    01-13-2003 and 07-22-2006 both @[facility]    LAPAROSCOPIC CHOLECYSTECTOMY SINGLE SITE WITH INTRAOPERATIVE CHOLANGIOGRAM N/A 05/30/2021   Procedure: LAPAROSCOPIC CHOLECYSTECTOMY SINGLE SITE WITH INTRAOPERATIVE CHOLANGIOGRAM;  Surgeon: [name] Standing, MD;  Location: [facility];  Service: General;  Laterality: N/A;   LAPAROSCOPIC INGUINAL HERNIA REPAIR Bilateral 04/28/2007   @[facility]    LEFT HEART CATH AND CORONARY ANGIOGRAPHY N/A 08/04/2017   Procedure: LEFT  HEART CATH AND CORONARY ANGIOGRAPHY;  Surgeon: [name] Lonni BIRCH, MD;  Location: [facility];  Service: Cardiovascular;  Laterality: N/A;   THROAT SURGERY  1997   pt stated removal of tumor that was benign       Inpatient Medications: Scheduled Meds:  [START ON 01/01/2024] aspirin  EC  81 mg Oral Daily   Bempedoic Acid -Ezetimibe   1 tablet Oral Daily   budesonide -glycopyrrolate -formoterol   2 puff Inhalation BID   [START ON 01/01/2024] fluticasone   2 spray Each Nare Daily   heparin   5,000 Units Subcutaneous Q8H   isosorbide  mononitrate  30 mg Oral Daily   [START ON 01/01/2024] levothyroxine   50 mcg Oral Daily   [START ON 01/01/2024] lisinopril   20 mg Oral Daily   nicotine   21 mg Transdermal Daily   [START ON 01/01/2024] pantoprazole   40 mg Oral Daily   Continuous Infusions:  PRN Meds: acetaminophen , albuterol , ondansetron  (ZOFRAN ) IV  Allergies:    Allergies  Allergen Reactions   Crestor [Rosuvastatin Calcium ] Other (See Comments)    Lethargy and muscle aches   Flomax  [Tamsulosin  Hcl] Other (See Comments)    Made patient hypotensive, clammy, sweaty, and feel like he was going to pass out (EMS HAD TO BE CALLED)   Lipitor [Atorvastatin Calcium ] Other (See Comments)    Body cramps, lethargy and muscle aches    Social History:   Social History   Socioeconomic History   Marital status: Married    Spouse name: [name] [name]   Number of children: 4   Years of education: high school diploma   Highest education level: 12th grade  Occupational History   Occupation: truck Air traffic controller: [name] [name]  Tobacco Use   Smoking status: Every Day    Current packs/day: 1.50    Average packs/day: 1 pack/day for 58.5 years (58.8 ttl pk-yrs)    Types: Cigarettes    Start date: 2025   Smokeless tobacco: Never  Vaping Use   Vaping status: Never Used  Substance and Sexual Activity   Alcohol use: Not Currently    Comment: small amount of brandy every night   Drug use: No    Sexual activity: Yes    Birth control/protection: None  Other Topics Concern   Not on file  Social History Narrative   Lives with wife in a split level home.    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   Social Drivers of Corporate investment banker Strain: Low Risk  (04/01/2023)   Overall Financial Resource Strain (CARDIA)    Difficulty of Paying Living Expenses: Not hard at all  Food Insecurity: No  Food Insecurity (04/01/2023)   Hunger Vital Sign    Worried About Running Out of Food in the Last Year: Never true    Ran Out of Food in the Last Year: Never true  Transportation Needs: No Transportation Needs (04/01/2023)   PRAPARE - Administrator, Civil Service (Medical): No    Lack of Transportation (Non-Medical): No  Physical Activity: Sufficiently Active (04/01/2023)   Exercise Vital Sign    Days of Exercise per Week: 5 days    Minutes of Exercise per Session: 30 min  Stress: No Stress Concern Present (04/01/2023)   [name]    Feeling of Stress : Not at all  Social Connections: Socially Integrated (04/01/2023)   Social Connection and Isolation Panel    Frequency of Communication with Friends and Family: More than three times a week    Frequency of Social Gatherings with Friends and Family: More than three times a week    Attends Religious Services: More than 4 times per year    Active Member of [employer] or Organizations: Yes    Attends Engineer, structural: More than 4 times per year    Marital Status: Married  Catering manager Violence: Not At Risk (04/01/2023)   Humiliation, Afraid, Rape, and Kick questionnaire    Fear of Current or Ex-Partner: No    Emotionally Abused: No    Physically Abused: No    Sexually Abused: No    Family History:     Family History  Problem Relation Age of Onset   Lupus Mother    Lung disease Father    Lupus Brother    Alcohol abuse Brother    Lung disease Brother    Colon polyps Brother    Colon cancer Brother 75   Cancer Brother        colon   Lung disease Brother    Healthy Daughter    Healthy Son    Healthy Son    Healthy Son    Esophageal cancer Neg Hx    Rectal cancer Neg Hx    Stomach cancer Neg Hx    Pancreatic cancer Neg Hx    Crohn's disease Neg Hx    Ulcerative colitis Neg Hx      ROS:  Please see the history of present illness.  ROS  All other ROS reviewed and negative.     Physical Exam/Data:   Vitals:   12/31/23 1230 12/31/23 1315 12/31/23 1425 12/31/23 1436  BP: 132/70 (!) 167/71  (!) 152/70  Pulse: 60 65  72  Resp: 17 20  18   Temp:    97.7 F (36.5 C)  TempSrc:    Oral  SpO2: 96% 95% 97% 95%  Weight:      Height:       No intake or output data in the 24 hours ending 12/31/23 1452 Filed Weights   12/31/23 0933  Weight: 68 kg   Body mass index is 21.51 kg/m.  General:  Well nourished, well developed, in no acute distress HEENT: normal Lymph: no adenopathy Neck: no JVD Endocrine:  No thryomegaly Vascular: No carotid bruits; FA pulses 2+ bilaterally without bruits  Cardiac:  normal S1, S2; RRR; no murmur  Lungs:  clear to auscultation bilaterally, no wheezing, rhonchi or rales  Abd: soft, nontender, no hepatomegaly  Ext: no edema Musculoskeletal:  No deformities, BUE and BLE strength normal and equal Skin: warm and dry  Neuro:  CNs  2-12 intact, no focal abnormalities noted Psych:  Normal affect   Laboratory Data:  Chemistry Recent Labs  Lab 12/31/23 1029  NA 141  K 4.7  CL 105  CO2 25  GLUCOSE 99  BUN 13  CREATININE 1.03  CALCIUM  9.1  GFRNONAA >60  ANIONGAP 11    Recent Labs  Lab 12/31/23 1029  PROT 6.6  ALBUMIN 3.3*  AST 15  ALT 10  ALKPHOS 84  BILITOT 0.7   Hematology Recent Labs  Lab 12/31/23 1029  WBC 8.6  RBC  4.78  HGB 15.1  HCT 45.1  MCV 94.4  MCH 31.6  MCHC 33.5  RDW 12.6  PLT 231   Cardiac EnzymesNo results for input(s): TROPONINI in the last 168 hours. No results for input(s): TROPIPOC in the last 168 hours.  BNPNo results for input(s): BNP, PROBNP in the last 168 hours.  DDimer No results for input(s): DDIMER in the last 168 hours.  Radiology/Studies:  DG Chest Port 1 View Result Date: 12/31/2023 CLINICAL DATA:  Chest pain EXAM: PORTABLE CHEST 1 VIEW COMPARISON:  Chest radiograph dated 08/26/2023 FINDINGS: Normal lung volumes. Biapical pleural-parenchymal scarring. Suggestion of spiculated nodule in the right apex. Unchanged calcified nodule in the right mid lung. No pleural effusion or pneumothorax. The heart size and mediastinal contours are within normal limits. No acute osseous abnormality. IMPRESSION: 1. No focal consolidations. 2. Suggestion of spiculated nodule in the right apex, which may reflect superimposition of pleuroparenchymal scarring. This area can be further evaluated on recommended follow-up lung cancer screening CT. Electronically Signed   By: Limin  Xu M.D.   On: 12/31/2023 10:08    Assessment and Plan:   Chest pain: Chest pressure/tightness started at 3 AM this morning after he woke up associated with left arm numbness.  Chest tightness started in the left upper side of the chest. Chest tightness resolved this morning with intermittent left arm numbness since then.  He does have a habit of sleeping on his left arm every day.  I reviewed his CT chest lung cancer screening reports from January and April 2025 which was remarkable for a new nodule in the left upper lobe in April 2025 measuring 5.5 mm (which was not present on January 2025 screening).  It is unclear to me if this new left upper lobe lung nodule is growing at a faster pace and affecting the brachial plexus causing current symptoms. Will defer this to primary team.  EKG today showed NSR, T wave inversions  in the inferior leads and anterolateral leads (new), frequent PVCs in a pattern of ventricular bigeminy. Hs troponins within normal limits, 7>>7.  LHC in 2019 showed CTO of RCA and moderate CAD in LAD/LCx.  His chest pressure/tightness is very atypical (he never had any chest pain with exertion, always had chest pain at rest).  But due to CTO RCA and moderate LAD and LCx disease, will empirically start Imdur  30 mg once daily.  Keep trending troponins.  If troponins are uptrending, start ACS protocol and keep n.p.o. after midnight for LHC at [facility].  If troponins are within normal limits, keep n.p.o. after midnight for Lexiscan tomorrow morning.  Frequent PVCs: RVOT origin, new.  Prior EKGs did not reveal any PVCs.  EKG admission showed NSR, T wave inversions in anterolateral leads and inferior leads, frequent PVCs in a pattern of ventricular bigeminy.  Obtain 2-week event monitor upon discharge to quantify the PVC burden.  CAD: LHC in 2019 showed CTO of RCA and moderate  disease in LAD and LCx.  Plan as above.  Continue cardioprotective medications, aspirin  81 mg once daily, bempedoic acid -ezetimibe  180-10 mg once daily.  HTN, partially controlled: Continue lisinopril  20 mg once daily.  Added Imdur .  Nicotine  abuse: Smokes 1 and half packs per day.  Counseling provided but patient reluctant/hesitant to be counseled.   For questions or updates, please contact [facility] Please consult [email or web address] for contact info under Cardiology/STEMI.   Signed, Jonell Krontz Priya Corbett Moulder, MD 12/31/2023 2:52 PM

## 2023-12-31 NOTE — Progress Notes (Signed)
 BP (!) 186/84   Pulse 87   SpO2 95%    Subjective:   Patient ID: [name] [name], male    DOB: 1951-02-24, 73 y.o.   MRN: [id number]  HPI: [name] [name] is a 73 y.o. male presenting on 12/31/2023 for Chest Pain (Just started this morning. Took 4 baby ASA)   HPI Patient is coming in today with complaints of chest pain and tightness in his chest that started around 330 this morning and has been intermittent coming and going.  He says he has tingling and pins and needle sensation in his left upper arm that has stayed that whole time.  He denies any more shortness of breath than what he usually has.  He does have a history of COPD.  He denies any nausea or vomiting or recent trauma.  He denies any fevers or chills or cough or congestion.  He has taken his normal blood pressure medicines and everything but has not taken anything extra.   Patient was given 4 baby aspirin  here in the office and told to chew them.  His blood pressure in the office here today was in the 160s systolic.  Vitals and oxygen were within normal limits other than blood pressure.  EMS was contacted to take him to the hospital for chest pain rule out.  Relevant past medical, surgical, family and social history reviewed and updated as indicated. Interim medical history since our last visit reviewed. Allergies and medications reviewed and updated.  Review of Systems  Constitutional:  Negative for chills and fever.  HENT:  Negative for congestion.   Eyes:  Negative for visual disturbance.  Respiratory:  Positive for chest tightness. Negative for cough, shortness of breath and wheezing.   Cardiovascular:  Positive for chest pain. Negative for palpitations and leg swelling.  Musculoskeletal:  Negative for back pain and gait problem.  Skin:  Negative for rash.  All other systems reviewed and are negative.   Per HPI unless specifically indicated above   Allergies as of 12/31/2023       Reactions   Crestor  [rosuvastatin Calcium ] Other (See Comments)   Lethargy and muscle aches   Flomax  [tamsulosin  Hcl] Other (See Comments)   Made patient hypotensive, clammy, sweaty, and feel like he was going to pass out (EMS HAD TO BE CALLED)   Lipitor [atorvastatin Calcium ] Other (See Comments)   Body cramps, lethargy and muscle aches        Medication List        Accurate as of December 31, 2023  9:34 AM. If you have any questions, ask your nurse or doctor.          albuterol  (2.5 MG/3ML) 0.083% nebulizer solution Commonly known as: PROVENTIL  Take 3 mLs (2.5 mg total) by nebulization every 6 (six) hours as needed for wheezing or shortness of breath.   albuterol  108 (90 Base) MCG/ACT inhaler Commonly known as: VENTOLIN  HFA INHALE TWO PUFFS EVERY 6 HOURS AS NEEDED   amLODipine  5 MG tablet Commonly known as: NORVASC  Take 1 tablet (5 mg total) by mouth daily.   aspirin  EC 81 MG tablet Take 1 tablet (81 mg total) by mouth daily. Swallow whole.   fluticasone  50 MCG/ACT nasal spray Commonly known as: FLONASE  Place 2 sprays into both nostrils daily.   levothyroxine  50 MCG tablet Commonly known as: SYNTHROID  Take 1 tablet (50 mcg total) by mouth daily.   lisinopril  20 MG tablet Commonly known as: ZESTRIL  Take 1 tablet (20  mg total) by mouth daily.   Nexlizet  180-10 MG Tabs Generic drug: Bempedoic Acid -Ezetimibe  TAKE ONE TABLET DAILY   omeprazole  20 MG capsule Commonly known as: PRILOSEC Take 1 capsule by mouth daily.   Trelegy Ellipta  100-62.5-25 MCG/ACT Aepb Generic drug: Fluticasone -Umeclidin-Vilant INHALE ONE PUFF DAILY         Objective:   BP (!) 186/84   Pulse 87   SpO2 95%   Wt Readings from Last 3 Encounters:  12/31/23 149 lb 14.6 oz (68 kg)  10/02/23 149 lb (67.6 kg)  09/02/23 146 lb (66.2 kg)    Physical Exam Vitals and nursing note reviewed.  Constitutional:      General: He is not in acute distress.    Appearance: Normal appearance. He is well-developed. He  is not diaphoretic.  Eyes:     General: No scleral icterus.    Conjunctiva/sclera: Conjunctivae normal.  Neck:     Thyroid : No thyromegaly.  Cardiovascular:     Rate and Rhythm: Normal rate and regular rhythm.     Heart sounds: Normal heart sounds. No murmur heard. Pulmonary:     Effort: Pulmonary effort is normal. No respiratory distress.     Breath sounds: Normal breath sounds. No wheezing, rhonchi or rales.  Abdominal:     General: Abdomen is flat. Bowel sounds are normal. There is no distension.     Palpations: Abdomen is soft.     Tenderness: There is no abdominal tenderness.     Hernia: No hernia is present.  Musculoskeletal:        General: Normal range of motion.     Cervical back: Neck supple.  Lymphadenopathy:     Cervical: No cervical adenopathy.  Skin:    General: Skin is warm and dry.     Findings: No rash.  Neurological:     Mental Status: He is alert and oriented to person, place, and time.     Coordination: Coordination normal.  Psychiatric:        Behavior: Behavior normal.       Assessment & Plan:   Problem List Items Addressed This Visit   None Visit Diagnoses       Chest pain, unspecified type    -  Primary   Relevant Orders   EKG 12-Lead       Her EKG machine would not connect to the Internet due to power outage yesterday so we are unable to get an EKG.  EMT was contacted for chest pain rule out to take the patient to the hospital. Follow up plan: Return if symptoms worsen or fail to improve.  Counseling provided for all of the vaccine components Orders Placed This Encounter  Procedures   EKG 12-Lead    Fonda Levins, MD [facility] 12/31/2023, 9:34 AM

## 2023-12-31 NOTE — H&P (Signed)
 History and Physical    GRAYER SPROLES FMW:[id number] DOB: [birth year] DOA: 12/31/2023  I have briefly reviewed the patient's prior medical records in [facility]  PCP: Dettinger, [name], MD  Patient coming from: PCPs office  Chief Complaint: Chest tightness  HPI: [name] [name] is a 73 y.o. male with medical history significant of tobacco abuse, coronary artery disease, COPD who comes to the ER after being sent from his PCPs office with chest tightness that started this morning when he woke up.  He also had numbness and tingling in his left arm that has since resolved. He was given 4 baby aspirin  by his PCP.  Vital signs and oxygen were within normal limits except for a slightly elevated blood pressure.  They were unable to get EKG in their office so EMS was consulted and took him to the hospital.  Patient continues to smoke.  Last catheterization was 2019: 1. Chronic total occlusion of the large, dominant mid RCA with filling of the distal RCA from right to right bridging collaterals and left to right collaterals.  2. The LAD is a large caliber vessel that courses to the apex. The mid to distal LAD has a moderate stenosis. This does not appear to be flow limiting. The first diagonal branch is moderate in caliber and has a moderate stenosis.  3. The Circumflex artery is a moderate caliber vessel that supplies two OM branches. There is a moderate stenosis in the proximal Circumflex extending back to the ostium. This is looked at closely in multiple views and does not appear to be flow limiting. This involves a moderate caliber first OM branch which has moderate ostial stenosis.  4. Normal LV systolic function  In the ER, laboratory data was unremarkable including troponins.  EKG with sinus/ventricular bigeminy  Review of Systems: As per HPI otherwise 10 point review of systems negative.   Past Medical History:  Diagnosis Date   Arthritis    Carotid artery stenosis without  cerebral infarction    vascular --- dr eliza;   09-24-2007  s/p right CEA   Chronic cholecystitis    w/  large calcified stone in gallbladder   Chronic constipation    COPD (chronic obstructive pulmonary disease) (HCC)    followed by pcp----  (05-28-2021  pt stated last used rescue 05-27-2021 for wheezing resolved after use, stated exacerbation approx  greater than a year ago)   Coronary artery disease 2002   cardiologist-- dr lavona ;  (05-28-2021  pt denies need to use nitro for CP) per last cath 08-04-2017  LM luminal irregularities, LAD 30% ,D1 ostial 35% ,CFx   AV groove ostial 25% , OM1 long prox 30%,  proxRCA occluded and dominant,   excellent left to right collateral filling.   Depression    Patient denies   DOE (dyspnea on exertion)    due to smoking/ copd   Dyslipidemia    Essential hypertension    Full dentures    GERD (gastroesophageal reflux disease)    History of adenomatous polyp of colon    History of COVID-19    per pt winter 2022, mild to moderate symptoms that resolved   History of diverticulitis of colon    History of kidney stones    Hyperlipidemia    Hypothyroidism    followed by pcp   Lung nodule    s/p bronchoscopy w/ bx in 2005,  benign   Osteoporosis    Smokers' cough (HCC)    05-28-2021  pt stated occasionally productive w/ white phlegm   Wears glasses     Past Surgical History:  Procedure Laterality Date   BRONCHOSCOPY  2005   CARDIAC CATHETERIZATION  08/11/2000   @[facility]  by dr morris;   preserved LVF,  ostial RCA 70-75%, other nonobstructive disease   CARDIAC CATHETERIZATION  03/30/2008   @[facility]  by dr hochrein;  occluded RCA and other nonobstructive involving LAD, CFx, D1,OM1  and left to right collateral filling,  mild inferior hypokinesis,  ef 55%   CAROTID ENDARTERECTOMY Right 09/24/2007   @[facility]  by dr eliza   COLONOSCOPY  01/19/2017   by armsbruster   EXCISION NEUROMA Left 10/01/2005   @[facility] ;  left inguinal   EXTRACORPOREAL SHOCK WAVE  LITHOTRIPSY  2015   INGUINAL HERNIA REPAIR Left    01-13-2003 and 07-22-2006 both @[facility]    LAPAROSCOPIC CHOLECYSTECTOMY SINGLE SITE WITH INTRAOPERATIVE CHOLANGIOGRAM N/A 05/30/2021   Procedure: LAPAROSCOPIC CHOLECYSTECTOMY SINGLE SITE WITH INTRAOPERATIVE CHOLANGIOGRAM;  Surgeon: Sheldon Standing, MD;  Location: [facility] [place];  Service: General;  Laterality: N/A;   LAPAROSCOPIC INGUINAL HERNIA REPAIR Bilateral 04/28/2007   @[facility]    LEFT HEART CATH AND CORONARY ANGIOGRAPHY N/A 08/04/2017   Procedure: LEFT HEART CATH AND CORONARY ANGIOGRAPHY;  Surgeon: Verlin Lonni BIRCH, MD;  Location: [facility];  Service: Cardiovascular;  Laterality: N/A;   THROAT SURGERY  1997   pt stated removal of tumor that was benign     reports that he has been smoking cigarettes. He has a 58 pack-year smoking history. He has never used smokeless tobacco. He reports current alcohol use of about 2.0 standard drinks of alcohol per week. He reports that he does not use drugs.  Allergies  Allergen Reactions   Crestor [Rosuvastatin Calcium ] Other (See Comments)    Lethargy and muscle aches   Flomax  [Tamsulosin  Hcl] Other (See Comments)    Made patient hypotensive, clammy, sweaty, and feel like he was going to pass out (EMS HAD TO BE CALLED)   Lipitor [Atorvastatin Calcium ] Other (See Comments)    Body cramps, lethargy and muscle aches    Family History  Problem Relation Age of Onset   Lupus Mother    Lung disease Father    Lupus Brother    Alcohol abuse Brother    Lung disease Brother    Colon polyps Brother    Colon cancer Brother 42   Cancer Brother        colon   Lung disease Brother    Healthy Daughter    Healthy Son    Healthy Son    Healthy Son    Esophageal cancer Neg Hx    Rectal cancer Neg Hx    Stomach cancer Neg Hx    Pancreatic cancer Neg Hx    Crohn's disease Neg Hx    Ulcerative colitis Neg Hx     Prior to Admission medications   Medication Sig Start Date End Date  Taking? Authorizing Provider  albuterol  (PROVENTIL ) (2.5 MG/3ML) 0.083% nebulizer solution Take 3 mLs (2.5 mg total) by nebulization every 6 (six) hours as needed for wheezing or shortness of breath. 08/26/23  Yes Milian, Marry Lenis, FNP  aspirin  EC 81 MG tablet Take 1 tablet (81 mg total) by mouth daily. Swallow whole. 07/03/21  Yes Cindie Nest K, PA-C  Bempedoic Acid -Ezetimibe  (NEXLIZET ) 180-10 MG TABS TAKE ONE TABLET DAILY 12/22/23  Yes Dettinger, [name], MD  fluticasone  (FLONASE ) 50 MCG/ACT nasal spray Place 2 sprays into both nostrils daily. 08/10/23  Yes [facility] Hummer, Nena, NP  levothyroxine  (SYNTHROID ) 50 MCG tablet Take 1 tablet (50 mcg total) by mouth daily. 04/03/23  Yes Dettinger, [name], MD  lisinopril  (ZESTRIL ) 20 MG tablet Take 1 tablet (20 mg total) by mouth daily. 10/02/23  Yes Dettinger, [name], MD  omeprazole  (PRILOSEC) 20 MG capsule Take 1 capsule by mouth daily.   Yes [provider]  TRELEGY ELLIPTA  100-62.5-25 MCG/ACT AEPB INHALE ONE PUFF DAILY 10/21/23  Yes Dettinger, [name], MD    Physical Exam: Vitals:   12/31/23 0936 12/31/23 1130 12/31/23 1215 12/31/23 1230  BP:  116/66 (!) 153/67 132/70  Pulse:  (!) 53 61 60  Resp:  16 (!) 22 17  Temp: 97.7 F (36.5 C)     TempSrc: Oral     SpO2:  95% 97% 96%  Weight:      Height:          Constitutional: NAD, calm, comfortable Eyes: PERRL, lids and conjunctivae normal ENMT: Mucous membranes are moist. Posterior pharynx clear of any exudate or lesions.Normal dentition.  Neck: normal, supple, no masses, no thyromegaly Respiratory: Diminished, no wheezing, no crackles. Normal respiratory effort. No accessory muscle use.  Cardiovascular: Regular rate and rhythm, no murmurs / rubs / gallops. No extremity edema. 2+ pedal pulses.  Abdomen: no tenderness, no masses palpated. Bowel sounds positive.  Musculoskeletal: no clubbing / cyanosis. Normal muscle tone.  Skin: no rashes, lesions, ulcers. No  induration Neurologic: CN 2-12 grossly intact. Strength 5/5 in all 4.  Psychiatric: Normal judgment and insight. Alert and oriented x 3. Normal mood.   Labs on Admission: I have personally reviewed following labs and imaging studies  CBC: Recent Labs  Lab 12/31/23 1029  WBC 8.6  NEUTROABS 4.7  HGB 15.1  HCT 45.1  MCV 94.4  PLT 231   Basic Metabolic Panel: Recent Labs  Lab 12/31/23 1029  NA 141  K 4.7  CL 105  CO2 25  GLUCOSE 99  BUN 13  CREATININE 1.03  CALCIUM  9.1   GFR: Estimated Creatinine Clearance: 61.4 mL/min (by C-G formula based on SCr of 1.03 mg/dL). Liver Function Tests: Recent Labs  Lab 12/31/23 1029  AST 15  ALT 10  ALKPHOS 84  BILITOT 0.7  PROT 6.6  ALBUMIN 3.3*   No results for input(s): LIPASE, AMYLASE in the last 168 hours. No results for input(s): AMMONIA in the last 168 hours. Coagulation Profile: No results for input(s): INR, PROTIME in the last 168 hours. Cardiac Enzymes: No results for input(s): CKTOTAL, CKMB, CKMBINDEX, TROPONINI in the last 168 hours. BNP (last 3 results) No results for input(s): PROBNP in the last 8760 hours. HbA1C: No results for input(s): HGBA1C in the last 72 hours. CBG: No results for input(s): GLUCAP in the last 168 hours. Lipid Profile: No results for input(s): CHOL, HDL, LDLCALC, TRIG, CHOLHDL, LDLDIRECT in the last 72 hours. Thyroid  Function Tests: No results for input(s): TSH, T4TOTAL, FREET4, T3FREE, THYROIDAB in the last 72 hours. Anemia Panel: No results for input(s): VITAMINB12, FOLATE, FERRITIN, TIBC, IRON, RETICCTPCT in the last 72 hours. Urine analysis:    Component Value Date/Time   COLORURINE YELLOW 01/07/2016 1950   APPEARANCEUR Clear 10/02/2023 0811   LABSPEC 1.023 01/07/2016 1950   PHURINE 6.0 01/07/2016 1950   GLUCOSEU Negative 10/02/2023 0811   HGBUR NEGATIVE 01/07/2016 1950   BILIRUBINUR Negative 10/02/2023 0811   KETONESUR  NEGATIVE 01/07/2016 1950   PROTEINUR Negative 10/02/2023 0811   PROTEINUR NEGATIVE 01/07/2016 1950  UROBILINOGEN negative 03/06/2014 0959   UROBILINOGEN 0.2 03/07/2013 1735   NITRITE Negative 10/02/2023 0811   NITRITE NEGATIVE 01/07/2016 1950   LEUKOCYTESUR Negative 10/02/2023 0811     Radiological Exams on Admission: DG Chest Port 1 View Result Date: 12/31/2023 CLINICAL DATA:  Chest pain EXAM: PORTABLE CHEST 1 VIEW COMPARISON:  Chest radiograph dated 08/26/2023 FINDINGS: Normal lung volumes. Biapical pleural-parenchymal scarring. Suggestion of spiculated nodule in the right apex. Unchanged calcified nodule in the right mid lung. No pleural effusion or pneumothorax. The heart size and mediastinal contours are within normal limits. No acute osseous abnormality. IMPRESSION: 1. No focal consolidations. 2. Suggestion of spiculated nodule in the right apex, which may reflect superimposition of pleuroparenchymal scarring. This area can be further evaluated on recommended follow-up lung cancer screening CT. Electronically Signed   By: Limin  Xu M.D.   On: 12/31/2023 10:08    EKG: Independently reviewed.  See above  Assessment/Plan Principal Problem:   Chest pain   Chest pain - Troponins trending flat - Cardiology consulted  Tobacco abuse - Encourage cessation - Nicotine  patch - Defer medications like Wellbutrin to PCP  COPD - Again encourage smoking cessation - Wean to room air - Does not appear to be having acute exacerbation currently   Suspect home in the morning   DVT prophylaxis: Heparin  Code Status: Full Family Communication: At bedside  Consults called: cards    Harlene RAYMOND Bowl [facility]   How to contact the [facility] Attending or Consulting provider 7A - 7P or covering provider during after hours 7P -7A, for this patient?  Check the care team in [facility] and look for a) attending/consulting [facility] provider listed and b) the [facility] team listed Log into [email or web address] and  use [place]'s universal password to access. If you do not have the password, please contact the hospital operator. Locate the [facility] provider you are looking for under [facility] and page to a number that you can be directly reached. If you still have difficulty reaching the provider, please page the [facility] (Director on Call) for the Hospitalists listed on amion for assistance.  12/31/2023, 1:19 PM

## 2024-01-01 ENCOUNTER — Encounter: Payer: Self-pay | Admitting: *Deleted

## 2024-01-01 ENCOUNTER — Observation Stay (HOSPITAL_BASED_OUTPATIENT_CLINIC_OR_DEPARTMENT_OTHER)

## 2024-01-01 ENCOUNTER — Other Ambulatory Visit: Payer: Self-pay | Admitting: *Deleted

## 2024-01-01 DIAGNOSIS — R079 Chest pain, unspecified: Secondary | ICD-10-CM

## 2024-01-01 DIAGNOSIS — E785 Hyperlipidemia, unspecified: Secondary | ICD-10-CM | POA: Diagnosis not present

## 2024-01-01 DIAGNOSIS — I493 Ventricular premature depolarization: Secondary | ICD-10-CM | POA: Diagnosis not present

## 2024-01-01 DIAGNOSIS — Z720 Tobacco use: Secondary | ICD-10-CM | POA: Diagnosis not present

## 2024-01-01 DIAGNOSIS — I2510 Atherosclerotic heart disease of native coronary artery without angina pectoris: Secondary | ICD-10-CM | POA: Diagnosis not present

## 2024-01-01 DIAGNOSIS — I10 Essential (primary) hypertension: Secondary | ICD-10-CM | POA: Diagnosis not present

## 2024-01-01 LAB — BASIC METABOLIC PANEL WITH GFR
Anion gap: 8 (ref 5–15)
BUN: 17 mg/dL (ref 8–23)
CO2: 24 mmol/L (ref 22–32)
Calcium: 9 mg/dL (ref 8.9–10.3)
Chloride: 106 mmol/L (ref 98–111)
Creatinine, Ser: 1.11 mg/dL (ref 0.61–1.24)
GFR, Estimated: >60 mL/min (ref >60)
Glucose, Bld: 113 mg/dL — ABNORMAL HIGH (ref 70–99)
Potassium: 4.1 mmol/L (ref 3.5–5.1)
Sodium: 138 mmol/L (ref 135–145)

## 2024-01-01 LAB — ECHOCARDIOGRAM COMPLETE
Area-P 1/2: 3.42 cm2
Height: 70 in
S' Lateral: 3 cm
Weight: 2155.22 [oz_av]

## 2024-01-01 LAB — CBC
HCT: 43.4 % (ref 39.0–52.0)
Hemoglobin: 14.4 g/dL (ref 13.0–17.0)
MCH: 30.6 pg (ref 26.0–34.0)
MCHC: 33.2 g/dL (ref 30.0–36.0)
MCV: 92.3 fL (ref 80.0–100.0)
Platelets: 226 K/uL (ref 150–400)
RBC: 4.7 MIL/uL (ref 4.22–5.81)
RDW: 12.4 % (ref 11.5–15.5)
WBC: 9.8 K/uL (ref 4.0–10.5)
nRBC: 0 % (ref 0.0–0.2)

## 2024-01-01 LAB — TROPONIN I (HIGH SENSITIVITY): Troponin I (High Sensitivity): 6 ng/L (ref <18)

## 2024-01-01 LAB — MAGNESIUM: Magnesium: 1.8 mg/dL (ref 1.7–2.4)

## 2024-01-01 MED ORDER — NICOTINE 21 MG/24HR TD PT24: 21.0000 mg | MEDICATED_PATCH | Freq: Every day | TRANSDERMAL | 0 refills | Status: DC

## 2024-01-01 MED ORDER — ISOSORBIDE MONONITRATE ER 30 MG PO TB24: 30.0000 mg | ORAL_TABLET | Freq: Every day | ORAL | 3 refills | Status: DC

## 2024-01-01 MED ORDER — PERFLUTREN LIPID MICROSPHERE
1.0000 mL | INTRAVENOUS | Status: DC | PRN
  Administered 2024-01-01: 5 mL via INTRAVENOUS

## 2024-01-01 NOTE — Discharge Summary (Signed)
 Physician Discharge Summary  [name] [name] FMW:[id number] DOB: 1950/10/13 DOA: 12/31/2023  PCP: Dettinger, [name], MD  Admit date: 12/31/2023 Discharge date: 01/01/2024  Admitted From: Home Discharge disposition: Home   Recommendations for Outpatient Follow-Up:   Outpatient stress test Monitor blood pressure and adjust blood pressure meds   Discharge Diagnosis:   Principal Problem:   Chest pain    Discharge Condition: Improved.  Diet recommendation: Low sodium, heart healthy  Wound care: None.  Code status: Full.   History of Present Illness:   [name] [name] is a 73 y.o. male with medical history significant of tobacco abuse, coronary artery disease, COPD who comes to the ER after being sent from his PCPs office with chest tightness that started this morning when he woke up.  He also had numbness and tingling in his left arm that has since resolved. He was given 4 baby aspirin  by his PCP.  Vital signs and oxygen were within normal limits except for a slightly elevated blood pressure.  They were unable to get EKG in their office so EMS was consulted and took him to the hospital.   Patient continues to smoke.  Last catheterization was 2019: 1. Chronic total occlusion of the large, dominant mid RCA with filling of the distal RCA from right to right bridging collaterals and left to right collaterals.  2. The LAD is a large caliber vessel that courses to the apex. The mid to distal LAD has a moderate stenosis. This does not appear to be flow limiting. The first diagonal branch is moderate in caliber and has a moderate stenosis.  3. The Circumflex artery is a moderate caliber vessel that supplies two OM branches. There is a moderate stenosis in the proximal Circumflex extending back to the ostium. This is looked at closely in multiple views and does not appear to be flow limiting. This involves a moderate caliber first OM branch which has moderate ostial stenosis.   4. Normal LV systolic function   In the ER, laboratory data was unremarkable including troponins.  EKG with sinus/ventricular bigeminy   Hospital Course by Problem:   Chest pain -per cards:  Left arm pain/numbness resolved yesterday. No recurrences. No recurrence of chest pain either. N.p.o. today for stress test but he had coffee. He is scheduled for outpatient Lexiscan  instead.  Echo normal.  Continue home medications in addition to Imdur  30 mg once daily.   Tobacco abuse - Encourage cessation - Nicotine  patch - Defer medications like Wellbutrin to PCP   COPD - Again encourage smoking cessation - Wean to room air - Does not appear to be having acute exacerbation currently   Medical Consultants:   Cardiology   Discharge Exam:   Vitals:   01/01/24 0710 01/01/24 0951  BP: (!) 186/77 (!) 159/79  Pulse:  81  Resp:    Temp:  97.6 F (36.4 C)  SpO2:  95%   Vitals:   01/01/24 0521 01/01/24 0700 01/01/24 0710 01/01/24 0951  BP: (!) 161/69 (!) 183/65 (!) 186/77 (!) 159/79  Pulse: 73 74  81  Resp: 17     Temp: 97.7 F (36.5 C) (!) 97.5 F (36.4 C)  97.6 F (36.4 C)  TempSrc: Oral Oral  Oral  SpO2: 97% 95%  95%  Weight:      Height:        General exam: Appears calm and comfortable.    The results of significant diagnostics from this hospitalization (including imaging,  microbiology, ancillary and laboratory) are listed below for reference.     Procedures and Diagnostic Studies:   ECHOCARDIOGRAM COMPLETE Result Date: 01/01/2024    ECHOCARDIOGRAM REPORT   Patient Name:   [name] [name] Date of Exam: 01/01/2024 Medical Rec #:  [id number]          Height:       70.0 in Accession #:    [id number]         Weight:       134.7 lb Date of Birth:  1950-10-06          BSA:          1.765 m Patient Age:    73 years           BP:           159/79 mmHg Patient Gender: M                  HR:           86 bpm. Exam Location:  Inpatient Procedure: 2D Echo, Intracardiac  Opacification Agent, Cardiac Doppler and Color            Doppler (Both Spectral and Color Flow Doppler were utilized during            procedure). Indications:    Chest Pain R07.9  History:        Patient has prior history of Echocardiogram examinations, most                 recent 03/09/2013. COPD, Signs/Symptoms:Chest Pain and Dyspnea;                 Risk Factors:Current Smoker, Hypertension and Dyslipidemia.  Sonographer:    Koleen Popper RDCS Referring Phys: [id number] LAYMON CHRISTELLA QUA  Sonographer Comments: Image acquisition challenging due to respiratory motion and Image acquisition challenging due to COPD. IMPRESSIONS  1. Challenging study due to COPD. Poor Echo images. Limited interrogation of valves.  2. Left ventricular ejection fraction, by estimation, is 55 to 60%. The left ventricle has normal function. The left ventricle has no regional wall motion abnormalities. Left ventricular diastolic parameters are consistent with Grade I diastolic dysfunction (impaired relaxation).  3. Right ventricular systolic function is normal. The right ventricular size is normal. There is normal pulmonary artery systolic pressure.  4. The mitral valve was not well visualized. No evidence of mitral valve regurgitation. No evidence of mitral stenosis.  5. The aortic valve was not well visualized. Aortic valve regurgitation is not visualized. No aortic stenosis is present.  6. The inferior vena cava is normal in size with greater than 50% respiratory variability, suggesting right atrial pressure of 3 mmHg. FINDINGS  Left Ventricle: Left ventricular ejection fraction, by estimation, is 55 to 60%. The left ventricle has normal function. The left ventricle has no regional wall motion abnormalities. Definity  contrast agent was given IV to delineate the left ventricular  endocardial borders. Strain was performed and the global longitudinal strain is indeterminate. The left ventricular internal cavity size was normal in size.  There is no left ventricular hypertrophy. Left ventricular diastolic parameters are consistent with Grade I diastolic dysfunction (impaired relaxation). Normal left ventricular filling pressure. Right Ventricle: The right ventricular size is normal. No increase in right ventricular wall thickness. Right ventricular systolic function is normal. There is normal pulmonary artery systolic pressure. The tricuspid regurgitant velocity is 2.19 m/s, and  with an assumed right atrial pressure of 3 mmHg, the estimated  right ventricular systolic pressure is 22.2 mmHg. Left Atrium: Left atrial size was normal in size. Right Atrium: Right atrial size was normal in size. Pericardium: There is no evidence of pericardial effusion. Mitral Valve: The mitral valve was not well visualized. No evidence of mitral valve regurgitation. No evidence of mitral valve stenosis. Tricuspid Valve: The tricuspid valve is not well visualized. Tricuspid valve regurgitation is not demonstrated. No evidence of tricuspid stenosis. Aortic Valve: The aortic valve was not well visualized. Aortic valve regurgitation is not visualized. No aortic stenosis is present. Pulmonic Valve: The pulmonic valve was not well visualized. Pulmonic valve regurgitation is not visualized. No evidence of pulmonic stenosis. Aorta: The aortic root is normal in size and structure. Venous: The inferior vena cava is normal in size with greater than 50% respiratory variability, suggesting right atrial pressure of 3 mmHg. IAS/Shunts: No atrial level shunt detected by color flow Doppler. Additional Comments: 3D was performed not requiring image post processing on an independent workstation and was indeterminate.  LEFT VENTRICLE PLAX 2D LVIDd:         4.50 cm   Diastology LVIDs:         3.00 cm   LV e' medial:    4.14 cm/s LV PW:         1.00 cm   LV E/e' medial:  8.6 LV IVS:        1.00 cm   LV e' lateral:   4.54 cm/s LVOT diam:     1.90 cm   LV E/e' lateral: 7.8 LVOT Area:     2.84  cm  IVC IVC diam: 1.60 cm LEFT ATRIUM           Index LA diam:      3.00 cm 1.70 cm/m LA Vol (A4C): 18.9 ml 10.71 ml/m   AORTA Ao Root diam: 3.10 cm MITRAL VALVE               TRICUSPID VALVE MV Area (PHT): 3.42 cm    TR Peak grad:   19.2 mmHg MV Decel Time: 222 msec    TR Vmax:        219.00 cm/s MV E velocity: 35.60 cm/s MV A velocity: 81.70 cm/s  SHUNTS MV E/A ratio:  0.44        Systemic Diam: 1.90 cm Vishnu Priya Mallipeddi Electronically signed by Diannah Late Mallipeddi Signature Date/Time: 01/01/2024/11:41:34 AM    Final    DG Chest Port 1 View Result Date: 12/31/2023 CLINICAL DATA:  Chest pain EXAM: PORTABLE CHEST 1 VIEW COMPARISON:  Chest radiograph dated 08/26/2023 FINDINGS: Normal lung volumes. Biapical pleural-parenchymal scarring. Suggestion of spiculated nodule in the right apex. Unchanged calcified nodule in the right mid lung. No pleural effusion or pneumothorax. The heart size and mediastinal contours are within normal limits. No acute osseous abnormality. IMPRESSION: 1. No focal consolidations. 2. Suggestion of spiculated nodule in the right apex, which may reflect superimposition of pleuroparenchymal scarring. This area can be further evaluated on recommended follow-up lung cancer screening CT. Electronically Signed   By: Limin  Xu M.D.   On: 12/31/2023 10:08     Labs:   Basic Metabolic Panel: Recent Labs  Lab 12/31/23 1029 01/01/24 0509  NA 141 138  K 4.7 4.1  CL 105 106  CO2 25 24  GLUCOSE 99 113*  BUN 13 17  CREATININE 1.03 1.11  CALCIUM  9.1 9.0  MG  --  1.8   GFR Estimated Creatinine Clearance: 51.2 mL/min (by C-G  formula based on SCr of 1.11 mg/dL). Liver Function Tests: Recent Labs  Lab 12/31/23 1029  AST 15  ALT 10  ALKPHOS 84  BILITOT 0.7  PROT 6.6  ALBUMIN 3.3*   No results for input(s): LIPASE, AMYLASE in the last 168 hours. No results for input(s): AMMONIA in the last 168 hours. Coagulation profile No results for input(s): INR,  PROTIME in the last 168 hours.  CBC: Recent Labs  Lab 12/31/23 1029 01/01/24 0509  WBC 8.6 9.8  NEUTROABS 4.7  --   HGB 15.1 14.4  HCT 45.1 43.4  MCV 94.4 92.3  PLT 231 226   Cardiac Enzymes: No results for input(s): CKTOTAL, CKMB, CKMBINDEX, TROPONINI in the last 168 hours. BNP: Invalid input(s): POCBNP CBG: No results for input(s): GLUCAP in the last 168 hours. D-Dimer No results for input(s): DDIMER in the last 72 hours. Hgb A1c No results for input(s): HGBA1C in the last 72 hours. Lipid Profile No results for input(s): CHOL, HDL, LDLCALC, TRIG, CHOLHDL, LDLDIRECT in the last 72 hours. Thyroid  function studies No results for input(s): TSH, T4TOTAL, T3FREE, THYROIDAB in the last 72 hours.  Invalid input(s): FREET3 Anemia work up No results for input(s): VITAMINB12, FOLATE, FERRITIN, TIBC, IRON, RETICCTPCT in the last 72 hours. Microbiology No results found for this or any previous visit (from the past 240 hours).   Discharge Instructions:   Discharge Instructions     Diet - low sodium heart healthy   Complete by: As directed    Increase activity slowly   Complete by: As directed       Allergies as of 01/01/2024       Reactions   Crestor [rosuvastatin Calcium ] Other (See Comments)   Lethargy and muscle aches   Flomax  [tamsulosin  Hcl] Other (See Comments)   Made patient hypotensive, clammy, sweaty, and feel like he was going to pass out (EMS HAD TO BE CALLED)   Lipitor [atorvastatin Calcium ] Other (See Comments)   Body cramps, lethargy and muscle aches        Medication List     TAKE these medications    albuterol  (2.5 MG/3ML) 0.083% nebulizer solution Commonly known as: PROVENTIL  Take 3 mLs (2.5 mg total) by nebulization every 6 (six) hours as needed for wheezing or shortness of breath.   aspirin  EC 81 MG tablet Take 1 tablet (81 mg total) by mouth daily. Swallow whole.   fluticasone  50 MCG/ACT  nasal spray Commonly known as: FLONASE  Place 2 sprays into both nostrils daily.   isosorbide  mononitrate 30 MG 24 hr tablet Commonly known as: IMDUR  Take 1 tablet (30 mg total) by mouth daily. Start taking on: January 02, 2024   levothyroxine  50 MCG tablet Commonly known as: SYNTHROID  Take 1 tablet (50 mcg total) by mouth daily.   lisinopril  20 MG tablet Commonly known as: ZESTRIL  Take 1 tablet (20 mg total) by mouth daily.   Nexlizet  180-10 MG Tabs Generic drug: Bempedoic Acid -Ezetimibe  TAKE ONE TABLET DAILY   nicotine  21 mg/24hr patch Commonly known as: NICODERM CQ  - dosed in mg/24 hours Place 1 patch (21 mg total) onto the skin daily. Start taking on: January 02, 2024   omeprazole  20 MG capsule Commonly known as: PRILOSEC Take 1 capsule by mouth daily.   Trelegy Ellipta  100-62.5-25 MCG/ACT Aepb Generic drug: Fluticasone -Umeclidin-Vilant INHALE ONE PUFF DAILY        Follow-up Information     Miriam Norris, NP Follow up on 01/29/2024.   Specialty: Cardiology Why: Cardiology Hospital Follow-up  on 01/29/2024 at 4:00 PM. Will be at the [place] office given no availability in [place]. Contact information: [address] [place] [address] [phone]         Dettinger, [name], MD Follow up in 1 week(s).   Specialties: Family Medicine, Cardiology Contact information: [address] [place] [address] [phone]                  Time coordinating discharge: 45 min  Signed:  Harlene RAYMOND Bowl DO  [facility] 01/01/2024, 11:54 AM

## 2024-01-01 NOTE — Care Management Obs Status (Signed)
 MEDICARE OBSERVATION STATUS NOTIFICATION   Patient Details  Name: [name] [name] MRN: [id number] Date of Birth: August 03, 1950   Medicare Observation Status Notification Given:  Yes    Hoy [name], LCSW 01/01/2024, 11:38 AM

## 2024-01-01 NOTE — Plan of Care (Signed)
  Problem: Education: Goal: Knowledge of General Education information will improve Description: Including pain rating scale, medication(s)/side effects and non-pharmacologic comfort measures Outcome: Adequate for Discharge   Problem: Health Behavior/Discharge Planning: Goal: Ability to manage health-related needs will improve Outcome: Adequate for Discharge   Problem: Clinical Measurements: Goal: Ability to maintain clinical measurements within normal limits will improve Outcome: Adequate for Discharge Goal: Will remain free from infection Outcome: Adequate for Discharge Goal: Diagnostic test results will improve Outcome: Adequate for Discharge Goal: Respiratory complications will improve Outcome: Adequate for Discharge Goal: Cardiovascular complication will be avoided Outcome: Adequate for Discharge   Problem: Activity: Goal: Risk for activity intolerance will decrease Outcome: Adequate for Discharge   Problem: Nutrition: Goal: Adequate nutrition will be maintained Outcome: Adequate for Discharge   Problem: Coping: Goal: Level of anxiety will decrease Outcome: Adequate for Discharge   Problem: Elimination: Goal: Will not experience complications related to bowel motility Outcome: Adequate for Discharge Goal: Will not experience complications related to urinary retention Outcome: Adequate for Discharge   Problem: Pain Managment: Goal: General experience of comfort will improve and/or be controlled Outcome: Adequate for Discharge   Problem: Safety: Goal: Ability to remain free from injury will improve Outcome: Adequate for Discharge   Problem: Skin Integrity: Goal: Risk for impaired skin integrity will decrease Outcome: Adequate for Discharge   Problem: Education: Goal: Understanding of cardiac disease, CV risk reduction, and recovery process will improve Outcome: Adequate for Discharge Goal: Individualized Educational Video(s) Outcome: Adequate for Discharge    Problem: Activity: Goal: Ability to tolerate increased activity will improve Outcome: Adequate for Discharge   Problem: Cardiac: Goal: Ability to achieve and maintain adequate cardiovascular perfusion will improve Outcome: Adequate for Discharge   Problem: Health Behavior/Discharge Planning: Goal: Ability to safely manage health-related needs after discharge will improve Outcome: Adequate for Discharge

## 2024-01-01 NOTE — Plan of Care (Signed)
   Problem: Activity: Goal: Risk for activity intolerance will decrease Outcome: Progressing   Problem: Coping: Goal: Level of anxiety will decrease Outcome: Progressing

## 2024-01-01 NOTE — Progress Notes (Signed)
 Echocardiogram 2D Echocardiogram has been performed.  Koleen KANDICE Popper, RDCS 01/01/2024, 11:05 AM

## 2024-01-01 NOTE — Progress Notes (Signed)
   01/01/24 1139  TOC Brief Assessment  Insurance and Status Reviewed  Patient has primary care physician Yes  Home environment has been reviewed Home w/ spouse  Prior level of function: Independent  Prior/Current Home Services No current home services  Social Drivers of Health Review SDOH reviewed no interventions necessary  Readmission risk has been reviewed Yes  Transition of care needs no transition of care needs at this time

## 2024-01-01 NOTE — Progress Notes (Addendum)
 Rounding Note   Patient Name: [name] [name] Date of Encounter: 01/01/2024   [facility] Cardiologist: Lynwood Schilling, MD   Subjective  Says that he had weakness along his left arm yesterday and just mild chest pressure at that time. Symptoms were not associated with activity. He denies any recurrent symptoms overnight or this morning. Anxious to go home. While he did have an NPO order for after midnight, he did have a cup of coffee this morning.  Scheduled Meds:  aspirin  EC  81 mg Oral Daily   Bempedoic Acid -Ezetimibe   1 tablet Oral Daily   budesonide -glycopyrrolate -formoterol   2 puff Inhalation BID   feeding supplement  237 mL Oral BID BM   fluticasone   2 spray Each Nare Daily   heparin   5,000 Units Subcutaneous Q8H   isosorbide  mononitrate  30 mg Oral Daily   levothyroxine   50 mcg Oral Daily   lisinopril   20 mg Oral Daily   nicotine   21 mg Transdermal Daily   pantoprazole   40 mg Oral Daily   Continuous Infusions:  PRN Meds: acetaminophen , albuterol , ondansetron  (ZOFRAN ) IV   Vital Signs  Vitals:   12/31/23 1805 12/31/23 1956 01/01/24 0521 01/01/24 0700  BP: (!) 155/77 138/81 (!) 161/69 (!) 183/65  Pulse: 90 79 73 74  Resp:  20 17   Temp: 98.3 F (36.8 C) 97.9 F (36.6 C) 97.7 F (36.5 C) (!) 97.5 F (36.4 C)  TempSrc: Oral Oral Oral Oral  SpO2: 95% 94% 97% 95%  Weight:      Height:        Intake/Output Summary (Last 24 hours) at 01/01/2024 0750 Last data filed at 01/01/2024 9378 Gross per 24 hour  Intake 240 ml  Output 400 ml  Net -160 ml      12/31/2023    2:58 PM 12/31/2023    9:33 AM 10/02/2023    8:06 AM  Last 3 Weights  Weight (lbs) 134 lb 11.2 oz 149 lb 14.6 oz 149 lb  Weight (kg) 61.1 kg 68 kg 67.586 kg      Telemetry  NSR, HR in 70's to 80's with frequent PVC's. Episodes of ventricular trigeminy and couplets.  - Personally Reviewed  ECG  NSR, HR 64 with PVC's and deep TWI along the inferior and lateral leads.  - Personally  Reviewed  Physical Exam  GEN: Pleasant male appearing in no acute distress.   Neck: No JVD Cardiac: RRR with frequent ectopic beats, no murmurs, rubs, or gallops.  Respiratory: Clear to auscultation bilaterally. GI: Soft, nontender, non-distended  MS: No pitting edema; No deformity. Neuro:  Nonfocal  Psych: Normal affect   Labs High Sensitivity Troponin:   Recent Labs  Lab 12/31/23 1029 12/31/23 1205 12/31/23 1636 12/31/23 1928 01/01/24 0509  TROPONINIHS 7 7 7 8 6      Chemistry Recent Labs  Lab 12/31/23 1029 01/01/24 0509  NA 141 138  K 4.7 4.1  CL 105 106  CO2 25 24  GLUCOSE 99 113*  BUN 13 17  CREATININE 1.03 1.11  CALCIUM  9.1 9.0  PROT 6.6  --   ALBUMIN 3.3*  --   AST 15  --   ALT 10  --   ALKPHOS 84  --   BILITOT 0.7  --   GFRNONAA >60 >60  ANIONGAP 11 8    Lipids No results for input(s): CHOL, TRIG, HDL, LABVLDL, LDLCALC, CHOLHDL in the last 168 hours.  Hematology Recent Labs  Lab 12/31/23 1029 01/01/24 0509  WBC  8.6 9.8  RBC 4.78 4.70  HGB 15.1 14.4  HCT 45.1 43.4  MCV 94.4 92.3  MCH 31.6 30.6  MCHC 33.5 33.2  RDW 12.6 12.4  PLT 231 226   Thyroid  No results for input(s): TSH, FREET4 in the last 168 hours.  BNPNo results for input(s): BNP, PROBNP in the last 168 hours.  DDimer No results for input(s): DDIMER in the last 168 hours.   Radiology  DG Chest Port 1 View Result Date: 12/31/2023 CLINICAL DATA:  Chest pain EXAM: PORTABLE CHEST 1 VIEW COMPARISON:  Chest radiograph dated 08/26/2023 FINDINGS: Normal lung volumes. Biapical pleural-parenchymal scarring. Suggestion of spiculated nodule in the right apex. Unchanged calcified nodule in the right mid lung. No pleural effusion or pneumothorax. The heart size and mediastinal contours are within normal limits. No acute osseous abnormality. IMPRESSION: 1. No focal consolidations. 2. Suggestion of spiculated nodule in the right apex, which may reflect superimposition of  pleuroparenchymal scarring. This area can be further evaluated on recommended follow-up lung cancer screening CT. Electronically Signed   By: Limin  Xu M.D.   On: 12/31/2023 10:08    Cardiac Studies  Cardiac Catheterization: 07/2017 Prox RCA to Mid RCA lesion is 100% stenosed. Ost Cx to Prox Cx lesion is 60% stenosed. Ost 1st Mrg lesion is 40% stenosed. Mid LAD to Dist LAD lesion is 50% stenosed. Dist LM to Prox LAD lesion is 20% stenosed. The left ventricular systolic function is normal. LV end diastolic pressure is normal. The left ventricular ejection fraction is 55-65% by visual estimate. There is no mitral valve regurgitation.   1. Chronic total occlusion of the large, dominant mid RCA with filling of the distal RCA from right to right bridging collaterals and left to right collaterals.  2. The LAD is a large caliber vessel that courses to the apex. The mid to distal LAD has a moderate stenosis. This does not appear to be flow limiting. The first diagonal branch is moderate in caliber and has a moderate stenosis.  3. The Circumflex artery is a moderate caliber vessel that supplies two OM branches. There is a moderate stenosis in the proximal Circumflex extending back to the ostium. This is looked at closely in multiple views and does not appear to be flow limiting. This involves a moderate caliber first OM branch which has moderate ostial stenosis.  4. Normal LV systolic function   Recommendations: I would continue with medical management of his CAD. Consider addition of long acting nitrates. Tobacco cessation is recommende  Patient Profile   73 y.o. male w/ PMH of CAD (s/p cath in 2019 showing CTO of RCA and residual disease elsewhere), COPD, HTN and HLD who is currently admitted for chest pain.   Assessment & Plan   1. Chest Pain with Mixed Features - Reported waking up at 0300 to get ready for work and had weakness along his left arm and chest pressure. No recent exertional pain  and he denies any recurrent symptoms overnight or this morning. Hs troponin values have been negative. EKG does show diffuse T wave inversion as discussed above. The initial plan was for him to have a Lexiscan  Myoview  this morning but unfortunately he consumed a cup of coffee. Reviewed with Dr. Mallipeddi and will plan to obtain an echocardiogram today and if this is reassuring, can obtain a Lexiscan  Myoview  as an outpatient next week. - He has been started on Imdur  30 mg daily. May benefit from beta-blocker therapy for PVC's (likely Bisoprolol given COPD).  Continue ASA and Nexlizet .   2. CAD - He has a history of known CAD as outlined above with prior cardiac catheterization in 2019 showing a CTO of the RCA and moderate disease elsewhere. Will plan for repeat ischemic evaluation as discussed above. Continue ASA, Imdur  and Nexlizet .   3. PVC's - Noted on EKG and telemetry this admission. 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 K+ at 4.1 this morning. Will add on magnesium. TSH normal when checked in 09/2023. - Will plan obtain an echocardiogram to assess for any structural abnormalities. Would benefit from a monitor as an outpatient to assess his PVC burden.  4. HTN - BP is elevated this morning but he has not yet received his morning medications. Continue Lisinopril  20 mg daily and Imdur  30 mg daily. Lisinopril  can be further titrated pending BP response.  5. HLD - LDL was at 61 when checked in 09/2023. He has been intolerant to statins and remains on Nexlizet .   6. COPD/Tobacco Use - Smokes approximately 1.5 ppd. Cessation advised.   For questions or updates, please contact [facility] Please consult [email or web address] for contact info under     Signed, Laymon CHRISTELLA Qua, PA-C  01/01/2024, 7:50 AM

## 2024-01-02 NOTE — ED Provider Notes (Signed)
 [facility] MEDICAL SURGICAL UNIT Provider Note   CSN: [id number] Arrival date & time: 12/31/23  9077     Patient presents with: Chest Pain   [name] [name] is a 73 y.o. male.   Patient complains of chest pain.  He has history of coronary artery disease and COPD.  The history is provided by the patient and medical records. No language interpreter was used.  Chest Pain Pain location:  L chest Pain quality: aching   Pain radiates to:  Does not radiate Pain severity:  Mild Onset quality:  Sudden Timing:  Constant Progression:  Worsening Chronicity:  Recurrent Relieved by:  Nothing Associated symptoms: no abdominal pain, no back pain, no cough, no fatigue and no headache        Prior to Admission medications   Medication Sig Start Date End Date Taking? Authorizing Provider  albuterol  (PROVENTIL ) (2.5 MG/3ML) 0.083% nebulizer solution Take 3 mLs (2.5 mg total) by nebulization every 6 (six) hours as needed for wheezing or shortness of breath. 08/26/23  Yes Milian, Marry Lenis, FNP  aspirin  EC 81 MG tablet Take 1 tablet (81 mg total) by mouth daily. Swallow whole. 07/03/21  Yes Lambert, Jennifer K, PA-C  Bempedoic Acid -Ezetimibe  (NEXLIZET ) 180-10 MG TABS TAKE ONE TABLET DAILY 12/22/23  Yes Dettinger, [name], MD  fluticasone  (FLONASE ) 50 MCG/ACT nasal spray Place 2 sprays into both nostrils daily. 08/10/23  Yes [facility] Hummer, Nena, NP  levothyroxine  (SYNTHROID ) 50 MCG tablet Take 1 tablet (50 mcg total) by mouth daily. 04/03/23  Yes Dettinger, [name], MD  lisinopril  (ZESTRIL ) 20 MG tablet Take 1 tablet (20 mg total) by mouth daily. 10/02/23  Yes Dettinger, [name], MD  omeprazole  (PRILOSEC) 20 MG capsule Take 1 capsule by mouth daily.   Yes [provider]  TRELEGY ELLIPTA  100-62.5-25 MCG/ACT AEPB INHALE ONE PUFF DAILY 10/21/23  Yes Dettinger, [name], MD  isosorbide  mononitrate (IMDUR ) 30 MG 24 hr tablet Take 1 tablet (30 mg total) by mouth daily. 01/02/24    Vann, Jessica U, DO  nicotine  (NICODERM CQ  - DOSED IN MG/24 HOURS) 21 mg/24hr patch Place 1 patch (21 mg total) onto the skin daily. 01/02/24   Vann, Jessica U, DO    Allergies: Crestor [rosuvastatin calcium ], Flomax  [tamsulosin  hcl], and Lipitor [atorvastatin calcium ]    Review of Systems  Constitutional:  Negative for appetite change and fatigue.  HENT:  Negative for congestion, ear discharge and sinus pressure.   Eyes:  Negative for discharge.  Respiratory:  Negative for cough.   Cardiovascular:  Positive for chest pain.  Gastrointestinal:  Negative for abdominal pain and diarrhea.  Genitourinary:  Negative for frequency and hematuria.  Musculoskeletal:  Negative for back pain.  Skin:  Negative for rash.  Neurological:  Negative for seizures and headaches.  Psychiatric/Behavioral:  Negative for hallucinations.     Updated Vital Signs BP (!) 159/79   Pulse 81   Temp 97.6 F (36.4 C) (Oral)   Resp 17   Ht 5' 10 (1.778 m)   Wt 61.1 kg   SpO2 95%   BMI 19.33 kg/m   Physical Exam Vitals and nursing note reviewed.  Constitutional:      Appearance: He is well-developed.  HENT:     Head: Normocephalic.     Nose: Nose normal.  Eyes:     General: No scleral icterus.    Conjunctiva/sclera: Conjunctivae normal.  Neck:     Thyroid : No thyromegaly.  Cardiovascular:     Rate  and Rhythm: Normal rate and regular rhythm.     Heart sounds: No murmur heard.    No friction rub. No gallop.  Pulmonary:     Breath sounds: No stridor. No wheezing or rales.  Chest:     Chest wall: No tenderness.  Abdominal:     General: There is no distension.     Tenderness: There is no abdominal tenderness. There is no rebound.  Musculoskeletal:        General: Normal range of motion.     Cervical back: Neck supple.  Lymphadenopathy:     Cervical: No cervical adenopathy.  Skin:    Findings: No erythema or rash.  Neurological:     Mental Status: He is alert and oriented to person, place, and  time.     Motor: No abnormal muscle tone.     Coordination: Coordination normal.  Psychiatric:        Behavior: Behavior normal.     (all labs ordered are listed, but only abnormal results are displayed) Labs Reviewed  COMPREHENSIVE METABOLIC PANEL WITH GFR - Abnormal; Notable for the following components:      Result Value   Albumin 3.3 (*)    All other components within normal limits  BASIC METABOLIC PANEL WITH GFR - Abnormal; Notable for the following components:   Glucose, Bld 113 (*)    All other components within normal limits  CBC WITH DIFFERENTIAL/PLATELET  CBC  MAGNESIUM  TROPONIN I (HIGH SENSITIVITY)  TROPONIN I (HIGH SENSITIVITY)  TROPONIN I (HIGH SENSITIVITY)  TROPONIN I (HIGH SENSITIVITY)  TROPONIN I (HIGH SENSITIVITY)    EKG: EKG Interpretation Date/Time:  Thursday December 31 2023 10:11:06 EDT Ventricular Rate:  93 PR Interval:  153 QRS Duration:  105 QT Interval:  401 QTC Calculation: 377 R Axis:   62  Text Interpretation: Sinus rhythm new Ventricular bigeminy Anteroseptal infarct, old Repol abnrm, global ischemia, diffuse leads Minimal ST elevation, lateral leads Confirmed by Doretha Folks ([id number]) on 01/01/2024 10:23:50 PM  Radiology: ECHOCARDIOGRAM COMPLETE Result Date: 01/01/2024    ECHOCARDIOGRAM REPORT   Patient Name:   [name] [name] Date of Exam: 01/01/2024 Medical Rec #:  [id number]          Height:       70.0 in Accession #:    [id number]         Weight:       134.7 lb Date of Birth:  October 19, 1950          BSA:          1.765 m Patient Age:    73 years           BP:           159/79 mmHg Patient Gender: M                  HR:           86 bpm. Exam Location:  Inpatient Procedure: 2D Echo, Intracardiac Opacification Agent, Cardiac Doppler and Color            Doppler (Both Spectral and Color Flow Doppler were utilized during            procedure). Indications:    Chest Pain R07.9  History:        Patient has prior history of Echocardiogram examinations,  most                 recent 03/09/2013. COPD, Signs/Symptoms:Chest Pain and Dyspnea;  Risk Factors:Current Smoker, Hypertension and Dyslipidemia.  Sonographer:    Koleen Popper RDCS Referring Phys: [id number] LAYMON CHRISTELLA QUA  Sonographer Comments: Image acquisition challenging due to respiratory motion and Image acquisition challenging due to COPD. IMPRESSIONS  1. Challenging study due to COPD. Poor Echo images. Limited interrogation of valves.  2. Left ventricular ejection fraction, by estimation, is 55 to 60%. The left ventricle has normal function. The left ventricle has no regional wall motion abnormalities. Left ventricular diastolic parameters are consistent with Grade I diastolic dysfunction (impaired relaxation).  3. Right ventricular systolic function is normal. The right ventricular size is normal. There is normal pulmonary artery systolic pressure.  4. The mitral valve was not well visualized. No evidence of mitral valve regurgitation. No evidence of mitral stenosis.  5. The aortic valve was not well visualized. Aortic valve regurgitation is not visualized. No aortic stenosis is present.  6. The inferior vena cava is normal in size with greater than 50% respiratory variability, suggesting right atrial pressure of 3 mmHg. FINDINGS  Left Ventricle: Left ventricular ejection fraction, by estimation, is 55 to 60%. The left ventricle has normal function. The left ventricle has no regional wall motion abnormalities. Definity  contrast agent was given IV to delineate the left ventricular  endocardial borders. Strain was performed and the global longitudinal strain is indeterminate. The left ventricular internal cavity size was normal in size. There is no left ventricular hypertrophy. Left ventricular diastolic parameters are consistent with Grade I diastolic dysfunction (impaired relaxation). Normal left ventricular filling pressure. Right Ventricle: The right ventricular size is normal. No  increase in right ventricular wall thickness. Right ventricular systolic function is normal. There is normal pulmonary artery systolic pressure. The tricuspid regurgitant velocity is 2.19 m/s, and  with an assumed right atrial pressure of 3 mmHg, the estimated right ventricular systolic pressure is 22.2 mmHg. Left Atrium: Left atrial size was normal in size. Right Atrium: Right atrial size was normal in size. Pericardium: There is no evidence of pericardial effusion. Mitral Valve: The mitral valve was not well visualized. No evidence of mitral valve regurgitation. No evidence of mitral valve stenosis. Tricuspid Valve: The tricuspid valve is not well visualized. Tricuspid valve regurgitation is not demonstrated. No evidence of tricuspid stenosis. Aortic Valve: The aortic valve was not well visualized. Aortic valve regurgitation is not visualized. No aortic stenosis is present. Pulmonic Valve: The pulmonic valve was not well visualized. Pulmonic valve regurgitation is not visualized. No evidence of pulmonic stenosis. Aorta: The aortic root is normal in size and structure. Venous: The inferior vena cava is normal in size with greater than 50% respiratory variability, suggesting right atrial pressure of 3 mmHg. IAS/Shunts: No atrial level shunt detected by color flow Doppler. Additional Comments: 3D was performed not requiring image post processing on an independent workstation and was indeterminate.  LEFT VENTRICLE PLAX 2D LVIDd:         4.50 cm   Diastology LVIDs:         3.00 cm   LV e' medial:    4.14 cm/s LV PW:         1.00 cm   LV E/e' medial:  8.6 LV IVS:        1.00 cm   LV e' lateral:   4.54 cm/s LVOT diam:     1.90 cm   LV E/e' lateral: 7.8 LVOT Area:     2.84 cm  IVC IVC diam: 1.60 cm LEFT ATRIUM  Index LA diam:      3.00 cm 1.70 cm/m LA Vol (A4C): 18.9 ml 10.71 ml/m   AORTA Ao Root diam: 3.10 cm MITRAL VALVE               TRICUSPID VALVE MV Area (PHT): 3.42 cm    TR Peak grad:   19.2 mmHg MV  Decel Time: 222 msec    TR Vmax:        219.00 cm/s MV E velocity: 35.60 cm/s MV A velocity: 81.70 cm/s  SHUNTS MV E/A ratio:  0.44        Systemic Diam: 1.90 cm Vishnu Priya Mallipeddi Electronically signed by Diannah Late Mallipeddi Signature Date/Time: 01/01/2024/11:41:34 AM    Final    DG Chest Port 1 View Result Date: 12/31/2023 CLINICAL DATA:  Chest pain EXAM: PORTABLE CHEST 1 VIEW COMPARISON:  Chest radiograph dated 08/26/2023 FINDINGS: Normal lung volumes. Biapical pleural-parenchymal scarring. Suggestion of spiculated nodule in the right apex. Unchanged calcified nodule in the right mid lung. No pleural effusion or pneumothorax. The heart size and mediastinal contours are within normal limits. No acute osseous abnormality. IMPRESSION: 1. No focal consolidations. 2. Suggestion of spiculated nodule in the right apex, which may reflect superimposition of pleuroparenchymal scarring. This area can be further evaluated on recommended follow-up lung cancer screening CT. Electronically Signed   By: Limin  Xu M.D.   On: 12/31/2023 10:08     Procedures   Medications Ordered in the ED  sodium chloride  0.9 % bolus 500 mL (0 mLs Intravenous Stopped 12/31/23 1130)  morphine  (PF) 2 MG/ML injection 2 mg (2 mg Intravenous Given 12/31/23 1011)  nitroGLYCERIN  (NITROSTAT ) SL tablet 0.4 mg (0.4 mg Sublingual Given 12/31/23 1011)                                    Medical Decision Making Amount and/or Complexity of Data Reviewed Labs: ordered. Radiology: ordered. ECG/medicine tests: ordered.  Risk Prescription drug management. Decision regarding hospitalization.   Patient will be admitted for chest pain and coronary artery disease.  Cardiology will consult    Final diagnoses:  None    ED Discharge Orders          Ordered    isosorbide  mononitrate (IMDUR ) 30 MG 24 hr tablet  Daily        01/01/24 1154    nicotine  (NICODERM CQ  - DOSED IN MG/24 HOURS) 21 mg/24hr patch  Daily        01/01/24  1154    Increase activity slowly        01/01/24 1154    Diet - low sodium heart healthy        01/01/24 1154               Suzette Pac, MD 01/02/24 [phone]

## 2024-01-06 ENCOUNTER — Ambulatory Visit (HOSPITAL_BASED_OUTPATIENT_CLINIC_OR_DEPARTMENT_OTHER)
Admission: RE | Admit: 2024-01-06 | Discharge: 2024-01-06 | Disposition: A | Discharge status: Home / Self Care | Source: Ambulatory Visit | Attending: Internal Medicine | Admitting: Internal Medicine

## 2024-01-06 ENCOUNTER — Other Ambulatory Visit: Payer: Self-pay | Admitting: Physician Assistant

## 2024-01-06 ENCOUNTER — Ambulatory Visit (HOSPITAL_COMMUNITY)
Admission: RE | Admit: 2024-01-06 | Discharge: 2024-01-06 | Disposition: A | Discharge status: Home / Self Care | Source: Ambulatory Visit | Attending: Internal Medicine | Admitting: Internal Medicine

## 2024-01-06 DIAGNOSIS — R079 Chest pain, unspecified: Secondary | ICD-10-CM | POA: Diagnosis not present

## 2024-01-06 LAB — NM MYOCAR MULTI W/SPECT W/WALL MOTION / EF
LV dias vol: 110 mL (ref 62–150)
LV sys vol: 60 mL (ref 4.2–5.8)
MPHR: 147 {beats}/min
Nuc Stress EF: 45 %
Peak HR: 108 {beats}/min
Percent HR: 73 %
RATE: 0.4
Rest HR: 65 {beats}/min
Rest Nuclear Isotope Dose: 10.9 mCi
SDS: 1
SRS: 14
SSS: 15
ST Depression (mm): 0 mm
Stress Nuclear Isotope Dose: 32.7 mCi
TID: 0.96

## 2024-01-06 MED ORDER — TECHNETIUM TC 99M TETROFOSMIN IV KIT
32.7000 | PACK | Freq: Once | INTRAVENOUS | Status: AC | PRN
  Administered 2024-01-06: 32.7 via INTRAVENOUS

## 2024-01-06 MED ORDER — SODIUM CHLORIDE FLUSH 0.9 % IV SOLN
INTRAVENOUS | Status: AC
  Administered 2024-01-06: 10 mL via INTRAVENOUS
  Filled 2024-01-06: qty 10

## 2024-01-06 MED ORDER — TECHNETIUM TC 99M TETROFOSMIN IV KIT
10.0000 | PACK | Freq: Once | INTRAVENOUS | Status: AC | PRN
  Administered 2024-01-06: 10.9 via INTRAVENOUS

## 2024-01-06 MED ORDER — REGADENOSON 0.4 MG/5ML IV SOLN
INTRAVENOUS | Status: AC
  Administered 2024-01-06: 0.4 mg via INTRAVENOUS
  Filled 2024-01-06: qty 5

## 2024-01-06 NOTE — Progress Notes (Signed)
     Reyes ONEIDA Galley presented for a Lexiscan  nuclear stress test today.  I Lorette CINDERELLA Kapur, PA-C, provided direct supervision and was present during the stress portion of the study today, which was completed without significant symptoms, immediate complications, or acute ST/T changes on ECG.  Stress imaging is pending at this time.  Preliminary ECG findings may be listed in the chart, but the stress test result will not be finalized until perfusion imaging is complete.  Lorette CINDERELLA Kapur, PA-C  01/06/2024, 10:19 AM

## 2024-01-12 ENCOUNTER — Ambulatory Visit: Payer: Self-pay | Admitting: Internal Medicine

## 2024-01-12 NOTE — Telephone Encounter (Signed)
 Pt is returning call.

## 2024-01-15 NOTE — Telephone Encounter (Signed)
 Pt returning call to nurse for test results

## 2024-01-16 DIAGNOSIS — H43393 Other vitreous opacities, bilateral: Secondary | ICD-10-CM | POA: Diagnosis not present

## 2024-01-25 ENCOUNTER — Ambulatory Visit: Payer: Self-pay

## 2024-01-25 NOTE — Telephone Encounter (Signed)
 Pt advised to go to ED and agreeable to plan

## 2024-01-25 NOTE — Telephone Encounter (Signed)
 FYI Only or Action Required?: FYI only for provider.  Patient was last seen in primary care on 12/31/2023 by Dettinger, [name], MD.  Called Nurse Triage reporting Shortness of Breath.  Symptoms began several weeks ago.  Interventions attempted: Prescription medications: Trelegy, Albuterol .  Symptoms are: rapidly worsening.  Triage Disposition: Go to ED Now (Notify PCP)  Patient/caregiver understands and will follow disposition?: Yes                        Copied from CRM [id number]. Topic: Clinical - Red Word Triage >> Jan 25, 2024  8:08 AM Rosaria A wrote: Red Word that prompted transfer to Nurse Triage: Patient is having trouble breathing, states it is like COPD. Reason for Disposition  [1] MODERATE difficulty breathing (e.g., speaks in phrases, SOB even at rest, pulse 100-120) AND [2] NEW-onset or WORSE than normal  Answer Assessment - Initial Assessment Questions 1. RESPIRATORY STATUS: Describe your breathing? (e.g., wheezing, shortness of breath, unable to speak, severe coughing)      Shortness of Breath 2. ONSET: When did this breathing problem begin?      X 2 weeks 3. PATTERN Does the difficult breathing come and go, or has it been constant since it started?      Constant 4. SEVERITY: How bad is your breathing? (e.g., mild, moderate, severe)      Moderate 5. RECURRENT SYMPTOM: Have you had difficulty breathing before? If Yes, ask: When was the last time? and What happened that time?      Yes, NP with pulm Thursday 6. CARDIAC HISTORY: Do you have any history of heart disease? (e.g., heart attack, angina, bypass surgery, angioplasty)      HTN 7. LUNG HISTORY: Do you have any history of lung disease?  (e.g., pulmonary embolus, asthma, emphysema)     COPD 8. CAUSE: What do you think is causing the breathing problem?      Unknown 9. OTHER SYMPTOMS: Do you have any other symptoms? (e.g., chest pain, cough, dizziness, fever, runny  nose)     Productive cough, white thick mucous  Protocols used: Breathing Difficulty-A-AH

## 2024-01-28 ENCOUNTER — Encounter: Payer: Self-pay | Admitting: Emergency Medicine

## 2024-01-28 ENCOUNTER — Ambulatory Visit: Admitting: Emergency Medicine

## 2024-01-28 VITALS — BP 173/75 | HR 90 | Temp 97.8°F | Ht 67.0 in | Wt 142.2 lb

## 2024-01-28 DIAGNOSIS — F17210 Nicotine dependence, cigarettes, uncomplicated: Secondary | ICD-10-CM

## 2024-01-28 DIAGNOSIS — R053 Chronic cough: Secondary | ICD-10-CM

## 2024-01-28 DIAGNOSIS — J441 Chronic obstructive pulmonary disease with (acute) exacerbation: Secondary | ICD-10-CM | POA: Diagnosis not present

## 2024-01-28 DIAGNOSIS — R059 Cough, unspecified: Secondary | ICD-10-CM | POA: Insufficient documentation

## 2024-01-28 DIAGNOSIS — F17200 Nicotine dependence, unspecified, uncomplicated: Secondary | ICD-10-CM

## 2024-01-28 MED ORDER — VALSARTAN 160 MG PO TABS: 160.0000 mg | ORAL_TABLET | Freq: Every day | ORAL | 11 refills | Status: DC

## 2024-01-28 MED ORDER — BREZTRI AEROSPHERE 160-9-4.8 MCG/ACT IN AERO
2.0000 | INHALATION_SPRAY | Freq: Two times a day (BID) | RESPIRATORY_TRACT | 11 refills | Status: DC
Start: 2024-01-28 — End: 2024-01-31

## 2024-01-28 NOTE — Patient Instructions (Addendum)
 Stop your Trelegy for now. We will try starting Breztri  2 puffs twice a day.  Rinse and gargle after using. Keep your albuterol  available to use 2 puffs or 1 nebulizer treatment up to every 4 hours if needed for shortness of breath, chest tightness, wheezing. We will perform a walking oximetry test today to see if you qualify for oxygen Get your screening CT scan of the chest in October as planned Stop your lisinopril .  We will try starting valsartan  160 mg once daily. You need to work hard on decreasing your cigarettes.  Ultimate goal would be to stop altogether.  We can talk about this more going forward. Follow-up in our office in October after your CT scan so we can review those results together and see how your breathing is doing on the new medication.

## 2024-01-28 NOTE — Assessment & Plan Note (Signed)
 Stop your lisinopril .  We will try starting valsartan  160 mg once daily.

## 2024-01-28 NOTE — Assessment & Plan Note (Signed)
 Progressive dyspnea over several months but significantly worse over the last 2 weeks.  Better at rest.  Need to rule out hypoxemia.  Will change his Trelegy to Breztri  and see if he gets better deposition, better control.  Stop your Trelegy for now. We will try starting Breztri  2 puffs twice a day.  Rinse and gargle after using. Keep your albuterol  available to use 2 puffs or 1 nebulizer treatment up to every 4 hours if needed for shortness of breath, chest tightness, wheezing. We will perform a walking oximetry test today to see if you qualify for oxygen Follow-up in our office in to see how your breathing is doing on the new medication.

## 2024-01-28 NOTE — Assessment & Plan Note (Addendum)
 You need to work hard on decreasing your cigarettes.  Ultimate goal would be to stop altogether.  We can talk about this more going forward. Get your screening CT scan of the chest in October as planned

## 2024-01-28 NOTE — Progress Notes (Signed)
 Subjective:    Patient ID: [name] [name], male    DOB: [birth year], 73 y.o.   MRN: [id number]  HPI 73 year old active smoker (60 pack years) with associated COPD, CAD, hypertension, GERD.  He had COVID-19 in 2022.  He also has a history of pulmonary nodular disease with a remote bronchoscopy in 2005 that was benign. He is on Trelegy, omeprazole , Flonase  prn, has albuterol  available. On lisinopril   He has had more SOB w exertion for last 2 weeks, better at rest. Trouble working, climbing 8 stairs. He has been on Trelegy for a few years. Uses albuterol  about 1-2 x a day. Does get some benefit. Coughs a lot, white mucous. Still has some reflux on omeprazole . He has a lot of bloating - hasn't benefited from gas-x, etc.    He participates in lung cancer screening program, most recent scan done 10/16/2023.  CT scan of the chest 10/16/23 reviewed by me, shows calcified right hilar adenopathy consistent with granulomatous disease, unchanged paratracheal node, decrease in size and near complete resolution of pulmonary nodules noted on a previous scan.  Other nodules are stable.  There is a new left upper lobe nodule 5.5 mm.  Moderate emphysema with bronchial thickening, some mucoid impaction and retained secretions  today he reports   Review of Systems As per HPI  Past Medical History:  Diagnosis Date   Arthritis    Carotid artery stenosis without cerebral infarction    vascular --- dr [name];   09-24-2007  s/p right CEA   Chronic cholecystitis    w/  large calcified stone in gallbladder   Chronic constipation    COPD (chronic obstructive pulmonary disease) (HCC)    followed by pcp----  (05-28-2021  pt stated last used rescue 05-27-2021 for wheezing resolved after use, stated exacerbation approx  greater than a year ago)   Coronary artery disease 2002   cardiologist-- dr lavona ;  (05-28-2021  pt denies need to use nitro for CP) per last cath 08-04-2017  LM luminal irregularities, LAD  30% ,D1 ostial 35% ,CFx   AV groove ostial 25% , OM1 long prox 30%,  proxRCA occluded and dominant,   excellent left to right collateral filling.   Depression    Patient denies   DOE (dyspnea on exertion)    due to smoking/ copd   Dyslipidemia    Essential hypertension    Full dentures    GERD (gastroesophageal reflux disease)    History of adenomatous polyp of colon    History of COVID-19    per pt winter 2022, mild to moderate symptoms that resolved   History of diverticulitis of colon    History of kidney stones    Hyperlipidemia    Hypothyroidism    followed by pcp   Lung nodule    s/p bronchoscopy w/ bx in 2005,  benign   Osteoporosis    Smokers' cough (HCC)    05-28-2021  pt stated occasionally productive w/ white phlegm   Wears glasses     Family History  Problem Relation Age of Onset   Lupus Mother    Lung disease Father    Lupus Brother    Alcohol abuse Brother    Lung disease Brother    Colon polyps Brother    Colon cancer Brother 60   Cancer Brother        colon   Lung disease Brother    Healthy Daughter    Healthy Son    Healthy Son  Healthy Son    Esophageal cancer Neg Hx    Rectal cancer Neg Hx    Stomach cancer Neg Hx    Pancreatic cancer Neg Hx    Crohn's disease Neg Hx    Ulcerative colitis Neg Hx      Social History   Socioeconomic History   Marital status: Married    Spouse name: [name]   Number of children: 4   Years of education: high school diploma   Highest education level: 12th grade  Occupational History   Occupation: truck Air traffic controller: [employer]  Tobacco Use   Smoking status: Every Day    Current packs/day: 1.50    Average packs/day: 1 pack/day for 58.6 years (58.9 ttl pk-yrs)    Types: Cigarettes    Start date: 2025   Smokeless tobacco: Never  Vaping Use   Vaping status: Never Used  Substance and Sexual Activity   Alcohol use: Not Currently    Comment: small amount of brandy every night   Drug use: No    Sexual activity: Yes    Birth control/protection: None  Other Topics Concern   Not on file  Social History Narrative   Lives with wife in a split level home.    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   Social Drivers of Corporate investment banker Strain: Low Risk  (04/01/2023)   Overall Financial Resource Strain (CARDIA)    Difficulty of Paying Living Expenses: Not hard at all  Food Insecurity: No Food Insecurity (12/31/2023)   Hunger Vital Sign    Worried About Running Out of Food in the Last Year: Never true    Ran Out of Food in the Last Year: Never true  Transportation Needs: No Transportation Needs (12/31/2023)   PRAPARE - Administrator, Civil Service (Medical): No    Lack of Transportation (Non-Medical): No  Physical Activity: Sufficiently Active (04/01/2023)   Exercise Vital Sign    Days of Exercise per Week: 5 days    Minutes of Exercise per Session: 30 min  Stress: No Stress Concern Present (04/01/2023)   [name]    Feeling of Stress : Not at all  Social Connections: Socially Integrated (12/31/2023)   Social Connection and Isolation Panel    Frequency of Communication with Friends and Family: More than three times a week    Frequency of Social Gatherings with Friends and Family: More than three times a week    Attends Religious Services: More than 4 times per year    Active Member of [employer] or Organizations: Yes    Attends Engineer, structural: More than 4 times per year    Marital Status: Married  Catering manager Violence: Not At Risk (12/31/2023)   Humiliation, Afraid, Rape, and Kick questionnaire    Fear of Current or Ex-Partner: No    Emotionally Abused: No    Physically Abused: No    Sexually Abused: No    Allergies   Allergen Reactions   Crestor [Rosuvastatin Calcium ] Other (See Comments)    Lethargy and muscle aches   Flomax  [Tamsulosin  Hcl] Other (See Comments)    Made patient hypotensive, clammy, sweaty, and feel like he was going to pass out (EMS HAD TO  BE CALLED)   Lipitor [Atorvastatin Calcium ] Other (See Comments)    Body cramps, lethargy and muscle aches    Current Outpatient Medications on File Prior to Visit  Medication Sig Dispense Refill   albuterol  (PROVENTIL ) (2.5 MG/3ML) 0.083% nebulizer solution Take 3 mLs (2.5 mg total) by nebulization every 6 (six) hours as needed for wheezing or shortness of breath. 150 mL 1   aspirin  EC 81 MG tablet Take 1 tablet (81 mg total) by mouth daily. Swallow whole. 90 tablet 3   Bempedoic Acid -Ezetimibe  (NEXLIZET ) 180-10 MG TABS TAKE ONE TABLET DAILY 90 tablet 1   fluticasone  (FLONASE ) 50 MCG/ACT nasal spray Place 2 sprays into both nostrils daily. 16 g 6   isosorbide  mononitrate (IMDUR ) 30 MG 24 hr tablet Take 1 tablet (30 mg total) by mouth daily. 30 tablet 3   levothyroxine  (SYNTHROID ) 50 MCG tablet Take 1 tablet (50 mcg total) by mouth daily. 90 tablet 3   nicotine  (NICODERM CQ  - DOSED IN MG/24 HOURS) 21 mg/24hr patch Place 1 patch (21 mg total) onto the skin daily. 28 patch 0   omeprazole  (PRILOSEC) 20 MG capsule Take 1 capsule by mouth daily.     No current facility-administered medications on file prior to visit.          Objective:   Physical Exam  Vitals:   01/28/24 1115  BP: (!) 173/75  Pulse: 90  Temp: 97.8 F (36.6 C)  TempSrc: Temporal  SpO2: 95%  Weight: 142 lb 3.2 oz (64.5 kg)  Height: 5' 7 (1.702 m)   Gen: Pleasant, thin, in no distress,  normal affect  ENT: No lesions,  mouth clear,  oropharynx clear, no postnasal drip  Neck: No JVD, no stridor  Lungs: No use of accessory muscles, distant, bilateral rhonchi, end expiratory wheezes  Cardiovascular: RRR, heart sounds normal, no murmur or gallops, no peripheral  edema  Musculoskeletal: No deformities, no cyanosis or clubbing  Neuro: alert, awake, non focal  Skin: Warm, no lesions or rash    Assessment & Plan:   COPD (chronic obstructive pulmonary disease) (HCC) Progressive dyspnea over several months but significantly worse over the last 2 weeks.  Better at rest.  Need to rule out hypoxemia.  Will change his Trelegy to Breztri  and see if he gets better deposition, better control.  Stop your Trelegy for now. We will try starting Breztri  2 puffs twice a day.  Rinse and gargle after using. Keep your albuterol  available to use 2 puffs or 1 nebulizer treatment up to every 4 hours if needed for shortness of breath, chest tightness, wheezing. We will perform a walking oximetry test today to see if you qualify for oxygen Follow-up in our office in to see how your breathing is doing on the new medication.  Cough Stop your lisinopril .  We will try starting valsartan  160 mg once daily.   TOBACCO ABUSE You need to work hard on decreasing your cigarettes.  Ultimate goal would be to stop altogether.  We can talk about this more going forward. Get your screening CT scan of the chest in October as planned    Lamar Chris, MD, PhD 01/28/2024, 4:58 PM [facility] [phone] or if no answer before 7:00PM call [phone] For any issues after 7:00PM please call eLink [phone]

## 2024-01-29 ENCOUNTER — Emergency Department (HOSPITAL_COMMUNITY)

## 2024-01-29 ENCOUNTER — Encounter: Payer: Self-pay | Admitting: Nurse Practitioner

## 2024-01-29 ENCOUNTER — Other Ambulatory Visit: Payer: Self-pay

## 2024-01-29 ENCOUNTER — Inpatient Hospital Stay (HOSPITAL_COMMUNITY)
Admission: EM | Admit: 2024-01-29 | Discharge: 2024-01-31 | DRG: 193 | Disposition: A | Discharge status: Home / Self Care | Attending: Family Medicine | Admitting: Family Medicine

## 2024-01-29 ENCOUNTER — Ambulatory Visit: Attending: Nurse Practitioner | Admitting: Nurse Practitioner

## 2024-01-29 ENCOUNTER — Encounter (HOSPITAL_COMMUNITY): Payer: Self-pay

## 2024-01-29 VITALS — BP 152/90 | HR 99 | Ht 70.0 in | Wt 142.4 lb

## 2024-01-29 DIAGNOSIS — K5900 Constipation, unspecified: Secondary | ICD-10-CM | POA: Diagnosis present

## 2024-01-29 DIAGNOSIS — R062 Wheezing: Secondary | ICD-10-CM | POA: Diagnosis not present

## 2024-01-29 DIAGNOSIS — I25119 Atherosclerotic heart disease of native coronary artery with unspecified angina pectoris: Secondary | ICD-10-CM | POA: Diagnosis present

## 2024-01-29 DIAGNOSIS — J449 Chronic obstructive pulmonary disease, unspecified: Secondary | ICD-10-CM | POA: Diagnosis present

## 2024-01-29 DIAGNOSIS — Z9981 Dependence on supplemental oxygen: Secondary | ICD-10-CM

## 2024-01-29 DIAGNOSIS — Z8616 Personal history of COVID-19: Secondary | ICD-10-CM

## 2024-01-29 DIAGNOSIS — Z79899 Other long term (current) drug therapy: Secondary | ICD-10-CM

## 2024-01-29 DIAGNOSIS — E039 Hypothyroidism, unspecified: Secondary | ICD-10-CM | POA: Diagnosis not present

## 2024-01-29 DIAGNOSIS — I10 Essential (primary) hypertension: Secondary | ICD-10-CM | POA: Diagnosis not present

## 2024-01-29 DIAGNOSIS — Z87442 Personal history of urinary calculi: Secondary | ICD-10-CM

## 2024-01-29 DIAGNOSIS — Z7951 Long term (current) use of inhaled steroids: Secondary | ICD-10-CM

## 2024-01-29 DIAGNOSIS — J440 Chronic obstructive pulmonary disease with acute lower respiratory infection: Secondary | ICD-10-CM | POA: Diagnosis present

## 2024-01-29 DIAGNOSIS — J189 Pneumonia, unspecified organism: Secondary | ICD-10-CM | POA: Diagnosis not present

## 2024-01-29 DIAGNOSIS — F17210 Nicotine dependence, cigarettes, uncomplicated: Secondary | ICD-10-CM | POA: Diagnosis present

## 2024-01-29 DIAGNOSIS — E785 Hyperlipidemia, unspecified: Secondary | ICD-10-CM | POA: Diagnosis not present

## 2024-01-29 DIAGNOSIS — I2510 Atherosclerotic heart disease of native coronary artery without angina pectoris: Secondary | ICD-10-CM | POA: Diagnosis present

## 2024-01-29 DIAGNOSIS — Z83719 Family history of colon polyps, unspecified: Secondary | ICD-10-CM

## 2024-01-29 DIAGNOSIS — K219 Gastro-esophageal reflux disease without esophagitis: Secondary | ICD-10-CM | POA: Diagnosis present

## 2024-01-29 DIAGNOSIS — Z811 Family history of alcohol abuse and dependence: Secondary | ICD-10-CM

## 2024-01-29 DIAGNOSIS — I6523 Occlusion and stenosis of bilateral carotid arteries: Secondary | ICD-10-CM | POA: Diagnosis present

## 2024-01-29 DIAGNOSIS — R051 Acute cough: Secondary | ICD-10-CM

## 2024-01-29 DIAGNOSIS — Z860101 Personal history of adenomatous and serrated colon polyps: Secondary | ICD-10-CM | POA: Diagnosis not present

## 2024-01-29 DIAGNOSIS — J441 Chronic obstructive pulmonary disease with (acute) exacerbation: Secondary | ICD-10-CM | POA: Diagnosis not present

## 2024-01-29 DIAGNOSIS — Z7982 Long term (current) use of aspirin: Secondary | ICD-10-CM

## 2024-01-29 DIAGNOSIS — Z79890 Hormone replacement therapy (postmenopausal): Secondary | ICD-10-CM | POA: Diagnosis not present

## 2024-01-29 DIAGNOSIS — R0602 Shortness of breath: Secondary | ICD-10-CM | POA: Diagnosis not present

## 2024-01-29 DIAGNOSIS — M810 Age-related osteoporosis without current pathological fracture: Secondary | ICD-10-CM | POA: Diagnosis not present

## 2024-01-29 DIAGNOSIS — J9601 Acute respiratory failure with hypoxia: Secondary | ICD-10-CM | POA: Diagnosis not present

## 2024-01-29 DIAGNOSIS — M1990 Unspecified osteoarthritis, unspecified site: Secondary | ICD-10-CM | POA: Diagnosis not present

## 2024-01-29 DIAGNOSIS — R0682 Tachypnea, not elsewhere classified: Secondary | ICD-10-CM

## 2024-01-29 DIAGNOSIS — F17200 Nicotine dependence, unspecified, uncomplicated: Secondary | ICD-10-CM | POA: Diagnosis present

## 2024-01-29 DIAGNOSIS — Z8269 Family history of other diseases of the musculoskeletal system and connective tissue: Secondary | ICD-10-CM

## 2024-01-29 DIAGNOSIS — Z888 Allergy status to other drugs, medicaments and biological substances status: Secondary | ICD-10-CM

## 2024-01-29 DIAGNOSIS — R059 Cough, unspecified: Secondary | ICD-10-CM | POA: Diagnosis not present

## 2024-01-29 DIAGNOSIS — J984 Other disorders of lung: Secondary | ICD-10-CM | POA: Diagnosis not present

## 2024-01-29 DIAGNOSIS — Z800 Family history of malignant neoplasm of digestive organs: Secondary | ICD-10-CM

## 2024-01-29 LAB — BASIC METABOLIC PANEL WITH GFR
Anion gap: 12 (ref 5–15)
BUN: 14 mg/dL (ref 8–23)
CO2: 24 mmol/L (ref 22–32)
Calcium: 9.4 mg/dL (ref 8.9–10.3)
Chloride: 105 mmol/L (ref 98–111)
Creatinine, Ser: 1.01 mg/dL (ref 0.61–1.24)
GFR, Estimated: >60 mL/min (ref >60)
Glucose, Bld: 103 mg/dL — ABNORMAL HIGH (ref 70–99)
Potassium: 3.1 mmol/L — ABNORMAL LOW (ref 3.5–5.1)
Sodium: 141 mmol/L (ref 135–145)

## 2024-01-29 LAB — PROCALCITONIN: Procalcitonin: <0.1 ng/mL

## 2024-01-29 LAB — BRAIN NATRIURETIC PEPTIDE: B Natriuretic Peptide: 127 pg/mL — ABNORMAL HIGH (ref 0.0–100.0)

## 2024-01-29 LAB — BLOOD GAS, VENOUS
Acid-Base Excess: 2 mmol/L (ref 0.0–2.0)
Bicarbonate: 27.3 mmol/L (ref 20.0–28.0)
Drawn by: 30791
O2 Saturation: 63.7 %
Patient temperature: 36.4
pCO2, Ven: 43 mmHg — ABNORMAL LOW (ref 44–60)
pH, Ven: 7.41 (ref 7.25–7.43)
pO2, Ven: 35 mmHg (ref 32–45)

## 2024-01-29 LAB — CBC
HCT: 43.5 % (ref 39.0–52.0)
Hemoglobin: 14.9 g/dL (ref 13.0–17.0)
MCH: 31.4 pg (ref 26.0–34.0)
MCHC: 34.3 g/dL (ref 30.0–36.0)
MCV: 91.8 fL (ref 80.0–100.0)
Platelets: 254 K/uL (ref 150–400)
RBC: 4.74 MIL/uL (ref 4.22–5.81)
RDW: 12.3 % (ref 11.5–15.5)
WBC: 10.9 K/uL — ABNORMAL HIGH (ref 4.0–10.5)
nRBC: 0 % (ref 0.0–0.2)

## 2024-01-29 MED ORDER — ENOXAPARIN SODIUM 40 MG/0.4ML IJ SOSY
40.0000 mg | PREFILLED_SYRINGE | INTRAMUSCULAR | Status: DC
  Administered 2024-01-29 – 2024-01-30 (×2): 40 mg via SUBCUTANEOUS
  Filled 2024-01-29 (×2): qty 0.4

## 2024-01-29 MED ORDER — SODIUM CHLORIDE 0.9 % IV SOLN
1.0000 g | Freq: Once | INTRAVENOUS | Status: AC
  Administered 2024-01-29: 1 g via INTRAVENOUS
  Filled 2024-01-29: qty 10

## 2024-01-29 MED ORDER — ONDANSETRON HCL 4 MG/2ML IJ SOLN: 4.0000 mg | Freq: Four times a day (QID) | INTRAMUSCULAR | Status: DC | PRN

## 2024-01-29 MED ORDER — ISOSORBIDE MONONITRATE ER 30 MG PO TB24
30.0000 mg | ORAL_TABLET | Freq: Every day | ORAL | Status: DC
  Administered 2024-01-30 – 2024-01-31 (×2): 30 mg via ORAL
  Filled 2024-01-29 (×2): qty 1

## 2024-01-29 MED ORDER — SODIUM CHLORIDE 0.9 % IV SOLN
500.0000 mg | INTRAVENOUS | Status: DC
  Administered 2024-01-29: 500 mg via INTRAVENOUS
  Filled 2024-01-29: qty 5

## 2024-01-29 MED ORDER — ALBUTEROL SULFATE (2.5 MG/3ML) 0.083% IN NEBU
2.5000 mg | INHALATION_SOLUTION | RESPIRATORY_TRACT | Status: DC | PRN
  Administered 2024-01-31: 2.5 mg via RESPIRATORY_TRACT
  Filled 2024-01-29: qty 3

## 2024-01-29 MED ORDER — BUDESON-GLYCOPYRROL-FORMOTEROL 160-9-4.8 MCG/ACT IN AERO
2.0000 | INHALATION_SPRAY | Freq: Two times a day (BID) | RESPIRATORY_TRACT | Status: DC
  Administered 2024-01-30 – 2024-01-31 (×3): 2 via RESPIRATORY_TRACT
  Filled 2024-01-29: qty 5.9

## 2024-01-29 MED ORDER — SODIUM CHLORIDE 0.9 % IV SOLN
2.0000 g | INTRAVENOUS | Status: DC
  Administered 2024-01-30: 2 g via INTRAVENOUS
  Filled 2024-01-29: qty 20

## 2024-01-29 MED ORDER — BEMPEDOIC ACID-EZETIMIBE 180-10 MG PO TABS
1.0000 | ORAL_TABLET | Freq: Every day | ORAL | Status: DC
  Administered 2024-01-30: 1 via ORAL

## 2024-01-29 MED ORDER — ORAL CARE MOUTH RINSE
15.0000 mL | OROMUCOSAL | Status: DC | PRN
Start: 2024-01-29 — End: 2024-01-31

## 2024-01-29 MED ORDER — SODIUM CHLORIDE 0.9 % IV SOLN
500.0000 mg | INTRAVENOUS | Status: DC
  Administered 2024-01-30: 500 mg via INTRAVENOUS
  Filled 2024-01-29: qty 5

## 2024-01-29 MED ORDER — PREDNISONE 20 MG PO TABS: 40.0000 mg | ORAL_TABLET | Freq: Every day | ORAL | Status: DC

## 2024-01-29 MED ORDER — NICOTINE 21 MG/24HR TD PT24
21.0000 mg | MEDICATED_PATCH | Freq: Every day | TRANSDERMAL | Status: DC
  Administered 2024-01-29 – 2024-01-30 (×2): 21 mg via TRANSDERMAL
  Filled 2024-01-29 (×2): qty 1

## 2024-01-29 MED ORDER — ACETAMINOPHEN 650 MG RE SUPP: 650.0000 mg | Freq: Four times a day (QID) | RECTAL | Status: DC | PRN

## 2024-01-29 MED ORDER — SODIUM CHLORIDE 0.9 % IV SOLN
1.0000 g | Freq: Once | INTRAVENOUS | Status: AC
  Administered 2024-01-30: 1 g via INTRAVENOUS
  Filled 2024-01-29: qty 10

## 2024-01-29 MED ORDER — PANTOPRAZOLE SODIUM 40 MG PO TBEC
40.0000 mg | DELAYED_RELEASE_TABLET | Freq: Every day | ORAL | Status: DC
  Administered 2024-01-30 – 2024-01-31 (×2): 40 mg via ORAL
  Filled 2024-01-29 (×2): qty 1

## 2024-01-29 MED ORDER — IPRATROPIUM-ALBUTEROL 0.5-2.5 (3) MG/3ML IN SOLN
3.0000 mL | Freq: Once | RESPIRATORY_TRACT | Status: AC
  Administered 2024-01-29: 3 mL via RESPIRATORY_TRACT
  Filled 2024-01-29: qty 3

## 2024-01-29 MED ORDER — ONDANSETRON HCL 4 MG PO TABS: 4.0000 mg | ORAL_TABLET | Freq: Four times a day (QID) | ORAL | Status: DC | PRN

## 2024-01-29 MED ORDER — IPRATROPIUM-ALBUTEROL 0.5-2.5 (3) MG/3ML IN SOLN
3.0000 mL | Freq: Four times a day (QID) | RESPIRATORY_TRACT | Status: DC
  Filled 2024-01-29: qty 3

## 2024-01-29 MED ORDER — ACETAMINOPHEN 325 MG PO TABS: 650.0000 mg | ORAL_TABLET | Freq: Four times a day (QID) | ORAL | Status: DC | PRN

## 2024-01-29 MED ORDER — METHYLPREDNISOLONE SODIUM SUCC 125 MG IJ SOLR
60.0000 mg | Freq: Two times a day (BID) | INTRAMUSCULAR | Status: AC
  Administered 2024-01-29 – 2024-01-30 (×2): 60 mg via INTRAVENOUS
  Filled 2024-01-29 (×2): qty 2

## 2024-01-29 MED ORDER — IPRATROPIUM-ALBUTEROL 0.5-2.5 (3) MG/3ML IN SOLN
3.0000 mL | Freq: Three times a day (TID) | RESPIRATORY_TRACT | Status: DC
  Administered 2024-01-30 – 2024-01-31 (×4): 3 mL via RESPIRATORY_TRACT
  Filled 2024-01-29 (×4): qty 3

## 2024-01-29 MED ORDER — POTASSIUM CHLORIDE CRYS ER 20 MEQ PO TBCR
40.0000 meq | EXTENDED_RELEASE_TABLET | Freq: Once | ORAL | Status: AC
  Administered 2024-01-29: 40 meq via ORAL
  Filled 2024-01-29: qty 2

## 2024-01-29 MED ORDER — ASPIRIN 81 MG PO TBEC
81.0000 mg | DELAYED_RELEASE_TABLET | Freq: Every day | ORAL | Status: DC
  Administered 2024-01-30 – 2024-01-31 (×2): 81 mg via ORAL
  Filled 2024-01-29 (×2): qty 1

## 2024-01-29 MED ORDER — LEVOTHYROXINE SODIUM 50 MCG PO TABS
50.0000 ug | ORAL_TABLET | Freq: Every day | ORAL | Status: DC
  Administered 2024-01-30 – 2024-01-31 (×2): 50 ug via ORAL
  Filled 2024-01-29 (×2): qty 1

## 2024-01-29 NOTE — Progress Notes (Addendum)
 Cardiology Office Note   Date:  01/29/2024  ID:  IOANNIS SCHUH, DOB May 25, 1951, MRN [id number] PCP: Dettinger, [name], MD  [facility] Providers Cardiologist:  Lynwood Schilling, MD     History of Present Illness [name] [name] is a 73 y.o. male with a PMH of CAD, mixed HLD, HTN, hx of carotid artery stenosis, s/p right CEA, and tobacco use, and COPD, who presents today for hospital follow-up.   Last seen by Dr. Schilling on Oct 29, 2022. Was overall doing well at the time. Was referred to [facility] for a PCSK9i and started on Norvasc  5 mg daily.   Recent hospitalization July 2025 due to atypical CP. Was scheduled for OP Lexiscan . Echo normal. See most recent results of OP Lexiscan  below.   He is here for follow-up. He states he is not doing well at all for the past 2 weeks. Mentions worsening shortness of breath and dyspea on exertion at rest. Has been so short of breath recently, says he's been having panic attacks because of it and says he strongly believes he needs to go to the ED to get help. Said he had a bad panic attack last night. Admits to DOE with minimal exertion and even DOE at rest. Denies any chest pain, palpitations, syncope, presyncope, dizziness, orthopnea, PND, swelling or significant weight changes, acute bleeding, or claudication.  She has saw pulmonology yesterday.  Lisinopril  was stopped and switched to valsartan  under 160 mg daily.  Working on getting him qualified for oxygen and inhaler was switched.  He says he checked his SpO2 this morning on his pulse ox on room air, was 82%.  He was short of breath at that time when he checked it.  Does tell me that pulmonology gave an order for oxygen, but says it has not come in yet.  Has been also having issues with his abdomen, admits to feeling of being bloated/also noticing weight loss/constipation.   ROS: Negative.  See HPI.  Studies Reviewed  EKG: EKG is not ordered today.   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 The study is intermediate risk. Risk based on decreased LVEF, there is no current myocardium at jeopardy. Consider correlating LVEF with echo.   No ST deviation was noted.   LV perfusion is abnormal. Large severe intensity inferior/inferoapical defect that is fixed. Defect complicated by adjacent gut radiotracer uptake, however wall motion abnormality would support prior infarct. No current ischemia.   Left ventricular function is abnormal. Nuclear stress EF: 45%. The left ventricular ejection fraction is mildly decreased (45-54%). End diastolic cavity size is normal.  Echocardiogram 12/2023:  1. Challenging study due to COPD. Poor Echo images. Limited interrogation  of valves.   2. Left ventricular ejection fraction, by estimation, is 55 to 60%. The  left ventricle has normal function. The left ventricle has no regional  wall motion abnormalities. Left ventricular diastolic parameters are  consistent with Grade I diastolic  dysfunction (impaired relaxation).   3. Right ventricular systolic function is normal. The right ventricular  size is normal. There is normal pulmonary artery systolic pressure.   4. The mitral valve was not well visualized. No evidence of mitral valve  regurgitation. No evidence of mitral stenosis.   5. The aortic valve was not well visualized. Aortic valve regurgitation  is not visualized. No aortic stenosis is present.   6. The inferior vena cava is normal in size with greater than 50%  respiratory variability, suggesting right atrial pressure of 3 mmHg.  Carotid duplex 06/2022: Summary:  Right Carotid: Velocities in the right ICA are consistent with a 1-39%  stenosis.   Left Carotid: Velocities in the left ICA are consistent with a 40-59%  stenosis.   Vertebrals: Bilateral vertebral arteries demonstrate antegrade flow.  Subclavians: Normal flow hemodynamics were seen in bilateral subclavian                arteries.   *See table(s) above for measurements and observations.  Suggest follow up study in 12 months.  AAA 06/2021:  Summary:  Abdominal Aorta: There is evidence of abnormal ectasia of the distal  Abdominal aorta. The largest aortic measurement is 2.5 cm. The largest  aortic diameter remains essentially unchanged compared to prior exam.  Previous diameter measurement was 2.5 cm  obtained on 02/11/16.  Stenosis: +--------------------+-------------+------------+  Location            Stenosis     Comments      +--------------------+-------------+------------+  Right External Iliac>50% stenosismild/low-end  +--------------------+-------------+------------+      IVC/Iliac: There is no evidence of thrombus involving the IVC.    *See table(s) above for measurements and observations.  Suggest follow up study in 24 months.  LHC 07/2017:  Prox RCA to Mid RCA lesion is 100% stenosed. Ost Cx to Prox Cx lesion is 60% stenosed. Ost 1st Mrg lesion is 40% stenosed. Mid LAD to Dist LAD lesion is 50% stenosed. Dist LM to Prox LAD lesion is 20% stenosed. The left ventricular systolic function is normal. LV end diastolic pressure is normal. The left ventricular ejection fraction is 55-65% by visual estimate. There is no mitral valve regurgitation.   1. Chronic total occlusion of the large, dominant mid RCA with filling of the distal RCA from right to right bridging collaterals and left to right collaterals.  2. The LAD is a large caliber vessel that courses to the apex. The mid to distal LAD has a moderate stenosis. This does not appear to be flow limiting. The first diagonal branch is moderate in caliber and has a moderate stenosis.  3. The Circumflex artery is a moderate caliber vessel that supplies two OM branches. There is a moderate stenosis in the proximal Circumflex extending back to the ostium. This is looked at closely in multiple views and does not appear to be flow  limiting. This involves a moderate caliber first OM branch which has moderate ostial stenosis.  4. Normal LV systolic function   Recommendations: I would continue with medical management of his CAD. Consider addition of long acting nitrates. Tobacco cessation is recommended.   Physical Exam VS:  BP (!) 152/90   Pulse 99   Ht 5' 10 (1.778 m)   Wt 142 lb 6.4 oz (64.6 kg)   SpO2 96%   BMI 20.43 kg/m        Wt Readings from Last 3 Encounters:  01/29/24 142 lb 6.4 oz (64.6 kg)  01/28/24 142 lb 3.2 oz (64.5 kg)  12/31/23 134 lb 11.2 oz (61.1 kg)    GEN: Thin, 73 year old male in acute distress NECK: No JVD; No carotid bruits CARDIAC: S1/S2, RRR, no murmurs, rubs, gallops RESPIRATORY:  Diminished to auscultation with labored work of breathing, tachypnea  ABDOMEN: Soft, non-tender, non-distended EXTREMITIES:  No edema; No deformity   ASSESSMENT AND PLAN  Shortness of breath, tachypnea, COPD exacerbation Admits to 2-week worsening shortness of breath noticed with minimal exertion/at rest.  Unstable symptoms and causing  panic attacks.  Has significant panic attack last night.  SPO2 noted to be 82% this morning.  He comes into the office very short winded and having a hard time giving report initially.  He wondered about going to the ED.  Did discuss going to the ED, patient is agreeable to this.  Believe patient is having COPD exacerbation.  He denies any fevers, chills, or signs or symptoms of pneumonia.  Denies any recent sick contacts.  EMS was called and this NP stayed with patient the entire time and gave report to EMS, who will take him to nearby hospital.  Wife at bedside who is agreeable with plan and is verbalized understanding. 2 L of oxygen was administered to patient for comfort via nasal cannula.  O2 sat on room air was 96%, O2 sat on 2 L oxygen was 95%.  He did state that this helped his breathing calm down some.  Still labored while EMS arrived.  Will have him follow-up with  outpatient cardiology in 1 to 2 weeks when discharged from the hospital.  I spent a total duration of 50 minutes reviewing prior notes, reviewing outside records including  labs, face-to-face counseling of  medical condition, pathophysiology, evaluation, management, and documenting the findings in the note.       Dispo: Follow-up with MD/APP in 1 to 2 weeks after hospital discharge or sooner if any changes.  Signed, [name] Crate, NP

## 2024-01-29 NOTE — ED Triage Notes (Signed)
 Pt c/o SOB and prob cough x2 weeks.saw pulmonologist yesterday and they ordered home o2 but pt has not received it yet. Call ems for SOB, when ems arrived at cardiologist office today pt was on 3lnc and spo2 97. With ems received 1 duoneb and 125mg  solumedrol. Denies cp

## 2024-01-29 NOTE — ED Notes (Signed)
 Pt given sandwich and soda per pt request. Pt states mild SHOB after breathing tx but feels much better.

## 2024-01-29 NOTE — ED Provider Notes (Signed)
 [name] EMERGENCY DEPARTMENT AT [facility] Provider Note   CSN: [id number] Arrival date & time: 01/29/24  1712     History  Chief Complaint  Patient presents with   Shortness of Breath    [name] [name] is a 73 y.o. male with COPD, CAD, tobacco abuse who presents with c/o SOB. Patient states his COPD has gotten worse in the last 2 weeks. More cough than usual productive of white mucous. Gasping for breath in the middle of the night, panic attacks in the middle of the night, possibly PND. Endorses anorexia as well. Denies f/c, leg swelling, h/o DVT/PE, travel/surgeries but did have recent admission 7/10-7/11/25 for chest pain, and o/p lexiscan  showed prior inferior/inferoapical infarction. Today saw his cardiology for f/[name] and noted SOB, DOE. Checked his pulse ox this AM and it was 82% on RA at home. Pulmonlogy ordered home oxygen but he doesn't have it yet. EMS administered 1 duoneb and 125 mg IV solumedrol. Denies chest pain. 3L [place] and stating 96%.   Past Medical History:  Diagnosis Date   Arthritis    Carotid artery stenosis without cerebral infarction    vascular --- dr eliza;   09-24-2007  s/p right CEA   Chronic cholecystitis    w/  large calcified stone in gallbladder   Chronic constipation    COPD (chronic obstructive pulmonary disease) (HCC)    followed by pcp----  (05-28-2021  pt stated last used rescue 05-27-2021 for wheezing resolved after use, stated exacerbation approx  greater than a year ago)   Coronary artery disease 2002   cardiologist-- dr lavona ;  (05-28-2021  pt denies need to use nitro for CP) per last cath 08-04-2017  LM luminal irregularities, LAD 30% ,D1 ostial 35% ,CFx   AV groove ostial 25% , OM1 long prox 30%,  proxRCA occluded and dominant,   excellent left to right collateral filling.   Depression    Patient denies   DOE (dyspnea on exertion)    due to smoking/ copd   Dyslipidemia    Essential hypertension    Full dentures     GERD (gastroesophageal reflux disease)    History of adenomatous polyp of colon    History of COVID-19    per pt winter 2022, mild to moderate symptoms that resolved   History of diverticulitis of colon    History of kidney stones    Hyperlipidemia    Hypothyroidism    followed by pcp   Lung nodule    s/p bronchoscopy w/ bx in 2005,  benign   Osteoporosis    Smokers' cough (HCC)    05-28-2021  pt stated occasionally productive w/ white phlegm   Wears glasses        Home Medications Prior to Admission medications   Medication Sig Start Date End Date Taking? Authorizing Provider  albuterol  (PROVENTIL ) (2.5 MG/3ML) 0.083% nebulizer solution Take 3 mLs (2.5 mg total) by nebulization every 6 (six) hours as needed for wheezing or shortness of breath. 08/26/23   MilianMarry Lenis, FNP  aspirin  EC 81 MG tablet Take 1 tablet (81 mg total) by mouth daily. Swallow whole. 07/03/21   [name], [name] K, PA-C  Bempedoic Acid -Ezetimibe  (NEXLIZET ) 180-10 MG TABS TAKE ONE TABLET DAILY 12/22/23   [name], [name] LABOR, MD  budesonide -glycopyrrolate -formoterol  (BREZTRI  AEROSPHERE) 160-9-4.8 MCG/ACT AERO inhaler Inhale 2 puffs into the lungs in the morning and at bedtime. 01/28/24   Shelah Lamar RAMAN, MD  fluticasone  (FLONASE ) 50 MCG/ACT nasal spray  Place 2 sprays into both nostrils daily. 08/10/23   [name] Morton Sebastian Pool, NP  isosorbide  mononitrate (IMDUR ) 30 MG 24 hr tablet Take 1 tablet (30 mg total) by mouth daily. 01/02/24   [name], [name] U, DO  levothyroxine  (SYNTHROID ) 50 MCG tablet Take 1 tablet (50 mcg total) by mouth daily. 04/03/23   [name], [name] LABOR, MD  nicotine  (NICODERM CQ  - DOSED IN MG/24 HOURS) 21 mg/24hr patch Place 1 patch (21 mg total) onto the skin daily. 01/02/24   [name], [name] U, DO  omeprazole  (PRILOSEC) 20 MG capsule Take 1 capsule by mouth daily.    [provider]  valsartan  (DIOVAN ) 160 MG tablet Take 1 tablet (160 mg total) by mouth daily. 01/28/24   Shelah Lamar RAMAN,  MD      Allergies    Crestor [rosuvastatin calcium ], Flomax  [tamsulosin  hcl], and Lipitor [atorvastatin calcium ]    Review of Systems   Review of Systems A 10 point review of systems was performed and is negative unless otherwise reported in HPI.  Physical Exam Updated Vital Signs BP 118/85   Pulse 92   Temp 97.9 F (36.6 C) (Oral)   Resp 17   Ht 5' 10 (1.778 m)   Wt 64.4 kg   SpO2 96%   BMI 20.37 kg/m  Physical Exam General: Normal appearing elderly male, lying in bed.  HEENT: PERRLA, Sclera anicteric, MMM, trachea midline.  Cardiology: RRR, no murmurs/rubs/gallops. Resp: Normal respiratory rate and effort.  Intermittent dry cough.  Mild expiratory wheezing bilaterally. Abd: Soft, non-tender, non-distended. No rebound tenderness or guarding.  GU: Deferred. MSK: No peripheral edema or signs of trauma.  Skin: warm, dry.  Neuro: A&Ox4, CNs II-XII grossly intact. MAEs. Sensation grossly intact.  Psych: Normal mood and affect.   ED Results / Procedures / Treatments   Labs (all labs ordered are listed, but only abnormal results are displayed) Labs Reviewed  BASIC METABOLIC PANEL WITH GFR - Abnormal; Notable for the following components:      Result Value   Potassium 3.1 (*)    Glucose, Bld 103 (*)    All other components within normal limits  CBC - Abnormal; Notable for the following components:   WBC 10.9 (*)    All other components within normal limits  BRAIN NATRIURETIC PEPTIDE - Abnormal; Notable for the following components:   B Natriuretic Peptide 127.0 (*)    All other components within normal limits  BLOOD GAS, VENOUS - Abnormal; Notable for the following components:   pCO2, Ven 43 (*)    All other components within normal limits    EKG None  Radiology DG Chest 2 View Result Date: 01/29/2024 EXAM: 2 VIEW(S) XRAY OF THE CHEST 01/29/2024 05:55:50 PM COMPARISON: 12/31/2023 CLINICAL HISTORY: Pt c/o SOB and prob cough x2 weeks. Saw pulmonologist yesterday and  they ordered home O2 but pt has not received it yet. Hx of COPD. FINDINGS: LUNGS AND PLEURA: Asymmetric biapical scarring, right greater than left, stable. Coarse pulmonary interstitial markings throughout. Some worsening of interstitial edema or infiltrate in the anterior right upper lobe and right middle lobe. Some thickening of or fluid in the interlobar fissures. Mild blunting of the posterior costophrenic angles suggesting small effusions Mild hyperinflation. No pneumothorax. HEART AND MEDIASTINUM: No acute abnormality of the cardiac and mediastinal silhouettes. BONES AND SOFT TISSUES: No acute osseous abnormality. IMPRESSION: 1. Worsening interstitial edema or infiltrate in the anterior right upper lobe and right middle lobe. 2. Mild blunting of the posterior costophrenic  angles suggesting small effusions. 3. Stable asymmetric biapical scarring, right greater than left. Electronically signed by: Dayne Hassell MD 01/29/2024 06:02 PM EDT RP Workstation: [id number]    Procedures Procedures    Medications Ordered in ED Medications  azithromycin  (ZITHROMAX ) 500 mg in sodium chloride  0.9 % 250 mL IVPB (500 mg Intravenous New Bag/Given 01/29/24 1919)  cefTRIAXone  (ROCEPHIN ) 1 g in sodium chloride  0.9 % 100 mL IVPB (1 g Intravenous Not Given 01/29/24 1915)  ipratropium-albuterol  (DUONEB) 0.5-2.5 (3) MG/3ML nebulizer solution 3 mL (3 mLs Nebulization Given 01/29/24 1846)  cefTRIAXone  (ROCEPHIN ) 1 g in sodium chloride  0.9 % 100 mL IVPB (0 g Intravenous Stopped 01/29/24 1914)  potassium chloride  SA (KLOR-CON  M) CR tablet 40 mEq (40 mEq Oral Given 01/29/24 1846)    ED Course/ Medical Decision Making/ A&P                          Medical Decision Making Amount and/or Complexity of Data Reviewed Labs: ordered. Radiology: ordered. Decision-making details documented in ED Course.  Risk Prescription drug management. Decision regarding hospitalization.    This patient presents to the ED for concern of  SOB/DOE/PND/cough, hypoxia; this involves an extensive number of treatment options, and is a complaint that carries with it a high risk of complications and morbidity.  I considered the following differential and admission for this acute, potentially life threatening condition. Pt is stalbe on 3L [place].  MDM:    , though patient does have possible edema on chest x-ray and an elevated BNP to 127.  However chest x-ray with specific anterior right upper lobe and right middle lobe infiltrate as opposed to diffuse edema, and with leukocytosis and productive cough, this could also be pneumonia.  Will treat with azithromycin  and ceftriaxone .  He does have possibly small effusions and could consider diuresis as well.  Consider ongoing COPD exacerbation, already received IV steroids with EMS and did have some improvement with DuoNeb with EMS, will give an additional DuoNeb.  Potassium is 3.1, mildly low, will give additional DuoNeb and so will replete with potassium.  Patient does not have oxygen at home and presents with new, possibly acute on chronic, hypoxic respiratory failure.SABRA  VBG without any hypercarbia.  He is stable on 3 L nasal cannula without any respiratory distress.  Will require admission to the hospitalist.  EKG without signs of arrhythmia or ischemia and no chest pain to indicate ACS.  Patient without any signs of DVT on exam or any chest pain, lower concern for PE as well, not the most likely clinical scenario.  Clinical Course as of 01/29/24 1936  Fri Jan 29, 2024  1835 DG Chest 2 View 1. Worsening interstitial edema or infiltrate in the anterior right upper lobe and right middle lobe. 2. Mild blunting of the posterior costophrenic angles suggesting small effusions. 3. Stable asymmetric biapical scarring, right greater than left.   [HN]    Clinical Course User Index [HN] Franklyn Sid SAILOR, MD    Labs: I Ordered, and personally interpreted labs.  The pertinent results include:  those listed  above  Imaging Studies ordered: I ordered imaging studies including CXR I independently visualized and interpreted imaging. I agree with the radiologist interpretation  Additional history obtained from those listed above.   Cardiac Monitoring: The patient was maintained on a cardiac monitor.  I personally viewed and interpreted the cardiac monitored which showed an underlying rhythm of: NSR  Reevaluation: After the interventions noted  above, I reevaluated the patient and found that they have :improved  Social Determinants of Health: Lives independently  Disposition: Admit to hospitalist  Co morbidities that complicate the patient evaluation  Past Medical History:  Diagnosis Date   Arthritis    Carotid artery stenosis without cerebral infarction    vascular --- dr eliza;   09-24-2007  s/p right CEA   Chronic cholecystitis    w/  large calcified stone in gallbladder   Chronic constipation    COPD (chronic obstructive pulmonary disease) (HCC)    followed by pcp----  (05-28-2021  pt stated last used rescue 05-27-2021 for wheezing resolved after use, stated exacerbation approx  greater than a year ago)   Coronary artery disease 2002   cardiologist-- dr lavona ;  (05-28-2021  pt denies need to use nitro for CP) per last cath 08-04-2017  LM luminal irregularities, LAD 30% ,D1 ostial 35% ,CFx   AV groove ostial 25% , OM1 long prox 30%,  proxRCA occluded and dominant,   excellent left to right collateral filling.   Depression    Patient denies   DOE (dyspnea on exertion)    due to smoking/ copd   Dyslipidemia    Essential hypertension    Full dentures    GERD (gastroesophageal reflux disease)    History of adenomatous polyp of colon    History of COVID-19    per pt winter 2022, mild to moderate symptoms that resolved   History of diverticulitis of colon    History of kidney stones    Hyperlipidemia    Hypothyroidism    followed by pcp   Lung nodule    s/p bronchoscopy w/  bx in 2005,  benign   Osteoporosis    Smokers' cough (HCC)    05-28-2021  pt stated occasionally productive w/ white phlegm   Wears glasses      Medicines Meds ordered this encounter  Medications   ipratropium-albuterol  (DUONEB) 0.5-2.5 (3) MG/3ML nebulizer solution 3 mL   azithromycin  (ZITHROMAX ) 500 mg in sodium chloride  0.9 % 250 mL IVPB    Antibiotic Indication::   CAP   cefTRIAXone  (ROCEPHIN ) 1 g in sodium chloride  0.9 % 100 mL IVPB    Antibiotic Indication::   CAP   potassium chloride  SA (KLOR-CON  M) CR tablet 40 mEq   cefTRIAXone  (ROCEPHIN ) 1 g in sodium chloride  0.9 % 100 mL IVPB    Antibiotic Indication::   CAP    I have reviewed the patients home medicines and have made adjustments as needed  Problem List / ED Course: Problem List Items Addressed This Visit       Respiratory   CAP (community acquired pneumonia)   Relevant Medications   azithromycin  (ZITHROMAX ) 500 mg in sodium chloride  0.9 % 250 mL IVPB   cefTRIAXone  (ROCEPHIN ) 1 g in sodium chloride  0.9 % 100 mL IVPB   Other Visit Diagnoses       COPD exacerbation (HCC)    -  Primary   Relevant Medications   ipratropium-albuterol  (DUONEB) 0.5-2.5 (3) MG/3ML nebulizer solution 3 mL (Completed)   azithromycin  (ZITHROMAX ) 500 mg in sodium chloride  0.9 % 250 mL IVPB     Acute hypoxic respiratory failure (HCC)                       This note was created using dictation software, which may contain spelling or grammatical errors.    Franklyn Sid SAILOR, MD 01/29/24 [phone]

## 2024-01-29 NOTE — H&P (Signed)
 History and Physical    Patient: [name] [name] FMW:[id number] DOB: 03/19/1951 DOA: 01/29/2024 DOS: the patient was seen and examined on 01/29/2024 PCP: Dettinger, [name], MD  Patient coming from: Home  Chief Complaint:  Chief Complaint  Patient presents with   Shortness of Breath   HPI: [name] [name] is a 73 y.o. male with medical history significant of COPD, CAD, GERD, chronic constipation, hypothyroidism, hypertension, smoker.  Patient has been experiencing increasing shortness of breath over the last 2 weeks.  Over the last 2 to 3 days, he has been waking up in the middle of the night gasping for breath and in a panic attack due to his breathing.  In addition, he has been getting more short of breath with exertion.  He went to his pulmonary doctor yesterday and was told he needed home oxygen.  He was ordered, but he was unable to get it.  He denies fevers, chills.  He does have a cough.  Denies chest pain.  He does have chronic abdominal bloating, particularly after eating breakfast.  He does experience constipation that he takes Colace for.  Review of Systems: As mentioned in the history of present illness. All other systems reviewed and are negative. Past Medical History:  Diagnosis Date   Arthritis    Carotid artery stenosis without cerebral infarction    vascular --- dr eliza;   09-24-2007  s/p right CEA   Chronic cholecystitis    w/  large calcified stone in gallbladder   Chronic constipation    COPD (chronic obstructive pulmonary disease) (HCC)    followed by pcp----  (05-28-2021  pt stated last used rescue 05-27-2021 for wheezing resolved after use, stated exacerbation approx  greater than a year ago)   Coronary artery disease 2002   cardiologist-- dr lavona ;  (05-28-2021  pt denies need to use nitro for CP) per last cath 08-04-2017  LM luminal irregularities, LAD 30% ,D1 ostial 35% ,CFx   AV groove ostial 25% , OM1 long prox 30%,  proxRCA occluded and dominant,    excellent left to right collateral filling.   Depression    Patient denies   DOE (dyspnea on exertion)    due to smoking/ copd   Dyslipidemia    Essential hypertension    Full dentures    GERD (gastroesophageal reflux disease)    History of adenomatous polyp of colon    History of COVID-19    per pt winter 2022, mild to moderate symptoms that resolved   History of diverticulitis of colon    History of kidney stones    Hyperlipidemia    Hypothyroidism    followed by pcp   Lung nodule    s/p bronchoscopy w/ bx in 2005,  benign   Osteoporosis    Smokers' cough (HCC)    05-28-2021  pt stated occasionally productive w/ white phlegm   Wears glasses    Past Surgical History:  Procedure Laterality Date   BRONCHOSCOPY  2005   CARDIAC CATHETERIZATION  08/11/2000   @[facility]  by dr morris;   preserved LVF,  ostial RCA 70-75%, other nonobstructive disease   CARDIAC CATHETERIZATION  03/30/2008   @[facility]  by dr hochrein;  occluded RCA and other nonobstructive involving LAD, CFx, D1,OM1  and left to right collateral filling,  mild inferior hypokinesis,  ef 55%   CAROTID ENDARTERECTOMY Right 09/24/2007   @[facility]  by dr eliza   COLONOSCOPY  01/19/2017   by armsbruster   EXCISION NEUROMA Left 10/01/2005   @  [facility];  left inguinal   EXTRACORPOREAL SHOCK WAVE LITHOTRIPSY  2015   INGUINAL HERNIA REPAIR Left    01-13-2003 and 07-22-2006 both @[facility]    LAPAROSCOPIC CHOLECYSTECTOMY SINGLE SITE WITH INTRAOPERATIVE CHOLANGIOGRAM N/A 05/30/2021   Procedure: LAPAROSCOPIC CHOLECYSTECTOMY SINGLE SITE WITH INTRAOPERATIVE CHOLANGIOGRAM;  Surgeon: Sheldon Standing, MD;  Location: [facility];  Service: General;  Laterality: N/A;   LAPAROSCOPIC INGUINAL HERNIA REPAIR Bilateral 04/28/2007   @[facility]    LEFT HEART CATH AND CORONARY ANGIOGRAPHY N/A 08/04/2017   Procedure: LEFT HEART CATH AND CORONARY ANGIOGRAPHY;  Surgeon: Verlin Lonni BIRCH, MD;  Location: [facility] IN[facility];  Service: Cardiovascular;  Laterality:  N/A;   THROAT SURGERY  1997   pt stated removal of tumor that was benign   Social History:  reports that he has been smoking cigarettes. He started smoking about 7 months ago. He has a 58.9 pack-year smoking history. He has never used smokeless tobacco. He reports that he does not currently use alcohol. He reports that he does not use drugs.  Allergies  Allergen Reactions   Crestor [Rosuvastatin Calcium ] Other (See Comments)    Lethargy and muscle aches   Flomax  [Tamsulosin  Hcl] Other (See Comments)    Made patient hypotensive, clammy, sweaty, and feel like he was going to pass out (EMS HAD TO BE CALLED)   Lipitor [Atorvastatin Calcium ] Other (See Comments)    Body cramps, lethargy and muscle aches    Family History  Problem Relation Age of Onset   Lupus Mother    Lung disease Father    Lupus Brother    Alcohol abuse Brother    Lung disease Brother    Colon polyps Brother    Colon cancer Brother 41   Cancer Brother        colon   Lung disease Brother    Healthy Daughter    Healthy Son    Healthy Son    Healthy Son    Esophageal cancer Neg Hx    Rectal cancer Neg Hx    Stomach cancer Neg Hx    Pancreatic cancer Neg Hx    Crohn's disease Neg Hx    Ulcerative colitis Neg Hx     Prior to Admission medications   Medication Sig Start Date End Date Taking? Authorizing Provider  albuterol  (PROVENTIL ) (2.5 MG/3ML) 0.083% nebulizer solution Take 3 mLs (2.5 mg total) by nebulization every 6 (six) hours as needed for wheezing or shortness of breath. 08/26/23   Cathlene Marry Lenis, FNP  aspirin  EC 81 MG tablet Take 1 tablet (81 mg total) by mouth daily. Swallow whole. 07/03/21   Lambert, Jennifer K, PA-C  Bempedoic Acid -Ezetimibe  (NEXLIZET ) 180-10 MG TABS TAKE ONE TABLET DAILY 12/22/23   Dettinger, [name], MD  budesonide -glycopyrrolate -formoterol  (BREZTRI  AEROSPHERE) 160-9-4.8 MCG/ACT AERO inhaler Inhale 2 puffs into the lungs in the morning and at bedtime. 01/28/24   Shelah Lamar RAMAN,  MD  fluticasone  (FLONASE ) 50 MCG/ACT nasal spray Place 2 sprays into both nostrils daily. 08/10/23   St Morton Sebastian Pool, NP  isosorbide  mononitrate (IMDUR ) 30 MG 24 hr tablet Take 1 tablet (30 mg total) by mouth daily. 01/02/24   Vann, Jessica U, DO  levothyroxine  (SYNTHROID ) 50 MCG tablet Take 1 tablet (50 mcg total) by mouth daily. 04/03/23   Dettinger, [name], MD  nicotine  (NICODERM CQ  - DOSED IN MG/24 HOURS) 21 mg/24hr patch Place 1 patch (21 mg total) onto the skin daily. 01/02/24   Vann, Jessica U, DO  omeprazole  (PRILOSEC)  20 MG capsule Take 1 capsule by mouth daily.    [provider]  valsartan  (DIOVAN ) 160 MG tablet Take 1 tablet (160 mg total) by mouth daily. 01/28/24   Shelah Lamar RAMAN, MD    Physical Exam: Vitals:   01/29/24 1719 01/29/24 1721 01/29/24 1800 01/29/24 1815  BP:   (!) 168/73 (!) 169/72  Pulse:  91 83 78  Resp:  20 (!) 24 (!) 21  Temp:  (!) 97.5 F (36.4 C)    TempSrc:  Oral    SpO2:  96% 96% 96%  Weight: 64.4 kg     Height: 5' 10 (1.778 m)      General: Elderly male. Awake and alert and oriented x3. No acute cardiopulmonary distress.  HEENT: Normocephalic atraumatic.  Right and left ears normal in appearance.  Pupils equal, round, reactive to light. Extraocular muscles are intact. Sclerae anicteric and noninjected.  Moist mucosal membranes. No mucosal lesions.  Neck: Neck supple without lymphadenopathy. No carotid bruits. No masses palpated.  Cardiovascular: Regular rate with normal S1-S2 sounds. No murmurs, rubs, gallops auscultated. No JVD.  Respiratory: Good respiratory effort.  Wheezing throughout all.  Rales in the upper lung fields.  No accessory muscle use. Abdomen: Soft, nontender, mild distention. Active bowel sounds. No masses or hepatosplenomegaly  Skin: No rashes, lesions, or ulcerations.  Dry, warm to touch. 2+ dorsalis pedis and radial pulses. Musculoskeletal: No calf or leg pain. All major joints not erythematous nontender.  No upper  or lower joint deformation.  Good ROM.  No contractures  Psychiatric: Intact judgment and insight. Pleasant and cooperative. Neurologic: No focal neurological deficits. Strength is 5/5 and symmetric in upper and lower extremities.  Cranial nerves II through XII are grossly intact.   Data Reviewed: Labs and imaging reviewed by me  Assessment and Plan: No notes have been filed under this hospital service. Service: Hospitalist  Principal Problem:   Acute respiratory failure with hypoxia (HCC) Active Problems:   TOBACCO ABUSE   Hypothyroidism   Coronary artery disease involving native coronary artery of native heart with angina pectoris (HCC)   Essential hypertension   GERD (gastroesophageal reflux disease)   COPD (chronic obstructive pulmonary disease) (HCC)   Bilateral carotid artery stenosis   COPD with acute exacerbation (HCC)   CAP (community acquired pneumonia)  Acute respiratory failure with hypoxia Admit Respiratory support COPD exacerbation Antibiotics: Rocephin  and DuoNeb's every 6 scheduled with albuterol  every 2 when necessary Continue inhaled steroids and LA bronchodilator Solu-Medrol  60 mg IV every 12 hours Mucinex  Community-acquired pneumonia Antibiotics: Rocephin  and azithromycin  Robitussin Blood cultures drawn in the emergency department Sputum cultures CBC tomorrow Strep and Legionella antigen by urine Tobacco abuse Nicotine  patch Hypothyroidism Continue Synthroid  Coronary artery disease with hypertension Continue antihypertensives GERD   Advance Care Planning:   Code Status: Full Code confirmed by patient  Consults: None  Family Communication: None  Severity of Illness: The appropriate patient status for this patient is INPATIENT. Inpatient status is judged to be reasonable and necessary in order to provide the required intensity of service to ensure the patient's safety. The patient's presenting symptoms, physical exam findings, and initial  radiographic and laboratory data in the context of their chronic comorbidities is felt to place them at high risk for further clinical deterioration. Furthermore, it is not anticipated that the patient will be medically stable for discharge from the hospital within 2 midnights of admission.   * I certify that at the point of admission it is my  clinical judgment that the patient will require inpatient hospital care spanning beyond 2 midnights from the point of admission due to high intensity of service, high risk for further deterioration and high frequency of surveillance required.*  Author: Diamantina Edinger J Luretha Eberly, DO 01/29/2024 7:18 PM  For on call review [email or web address].

## 2024-01-30 ENCOUNTER — Encounter (HOSPITAL_COMMUNITY): Payer: Self-pay | Admitting: Family Medicine

## 2024-01-30 DIAGNOSIS — J9601 Acute respiratory failure with hypoxia: Secondary | ICD-10-CM | POA: Diagnosis not present

## 2024-01-30 LAB — CBC
HCT: 43.6 % (ref 39.0–52.0)
Hemoglobin: 14.9 g/dL (ref 13.0–17.0)
MCH: 30.8 pg (ref 26.0–34.0)
MCHC: 34.2 g/dL (ref 30.0–36.0)
MCV: 90.3 fL (ref 80.0–100.0)
Platelets: 265 K/uL (ref 150–400)
RBC: 4.83 MIL/uL (ref 4.22–5.81)
RDW: 12.4 % (ref 11.5–15.5)
WBC: 4.3 K/uL (ref 4.0–10.5)
nRBC: 0 % (ref 0.0–0.2)

## 2024-01-30 LAB — BASIC METABOLIC PANEL WITH GFR
Anion gap: 10 (ref 5–15)
BUN: 16 mg/dL (ref 8–23)
CO2: 24 mmol/L (ref 22–32)
Calcium: 9.8 mg/dL (ref 8.9–10.3)
Chloride: 107 mmol/L (ref 98–111)
Creatinine, Ser: 1.07 mg/dL (ref 0.61–1.24)
GFR, Estimated: >60 mL/min (ref >60)
Glucose, Bld: 166 mg/dL — ABNORMAL HIGH (ref 70–99)
Potassium: 4.2 mmol/L (ref 3.5–5.1)
Sodium: 141 mmol/L (ref 135–145)

## 2024-01-30 LAB — STREP PNEUMONIAE URINARY ANTIGEN: Strep Pneumo Urinary Antigen: NEGATIVE

## 2024-01-30 MED ORDER — DM-GUAIFENESIN ER 30-600 MG PO TB12
1.0000 | ORAL_TABLET | Freq: Two times a day (BID) | ORAL | Status: DC
  Administered 2024-01-30 – 2024-01-31 (×3): 1 via ORAL
  Filled 2024-01-30 (×3): qty 1

## 2024-01-30 MED ORDER — ENSURE PLUS HIGH PROTEIN PO LIQD
237.0000 mL | Freq: Two times a day (BID) | ORAL | Status: DC
  Administered 2024-01-30 (×2): 237 mL via ORAL

## 2024-01-30 MED ORDER — TRAZODONE HCL 50 MG PO TABS
100.0000 mg | ORAL_TABLET | Freq: Every day | ORAL | Status: DC
  Administered 2024-01-30: 100 mg via ORAL
  Filled 2024-01-30: qty 2

## 2024-01-30 MED ORDER — METHYLPREDNISOLONE SODIUM SUCC 40 MG IJ SOLR
40.0000 mg | Freq: Two times a day (BID) | INTRAMUSCULAR | Status: DC
  Administered 2024-01-30 – 2024-01-31 (×2): 40 mg via INTRAVENOUS
  Filled 2024-01-30 (×2): qty 1

## 2024-01-30 MED ORDER — ALPRAZOLAM 0.5 MG PO TABS
0.5000 mg | ORAL_TABLET | Freq: Once | ORAL | Status: AC
  Administered 2024-01-30: 0.5 mg via ORAL
  Filled 2024-01-30: qty 1

## 2024-01-30 NOTE — Progress Notes (Signed)
 SATURATION QUALIFICATIONS: (This note is used to comply with regulatory documentation for home oxygen)  Forwarded from:  01/28/2024 [name] [name] [facility] Data (all recorded)  Ambulatory Pulse Oximetry   Row Name 01/28/24 1312      Resting  Resting Heart Rate 85      Resting Sp02 95      Lap 1 (250 feet)  HR 101      02 Sat 91      Lap 2 (250 feet)  HR 83      02 Sat 90      Lap 3 (250 feet)  HR 102      02 Sat 71      Tech Comments: pt droped to a 71 and was put on 2L of O2 and tolerated 2L

## 2024-01-30 NOTE — Plan of Care (Signed)

## 2024-01-30 NOTE — Progress Notes (Signed)
   01/30/24 1559  TOC Brief Assessment  Insurance and Status Reviewed  Patient has primary care physician Yes  Home environment has been reviewed From home c/wife  Prior level of function: Independent  Prior/Current Home Services No current home services  Social Drivers of Health Review SDOH reviewed interventions complete (smoking cessation added to AVS)  Readmission risk has been reviewed Yes  Transition of care needs transition of care needs identified, TOC will continue to follow   Pt states that his pulmonologist ordered home oxygen at his last office visit. Unable to verify if order was sent. Referral for home oxygen sent to [facility] and accepted. [facility] to deliver oxygen to pt's room tomorrow morning.

## 2024-01-30 NOTE — Discharge Instructions (Signed)
  Your home oxygen provider is: [facility]       [facility]) [address] [place], [place] [address] Phone: [phone]    [phone]

## 2024-01-30 NOTE — Progress Notes (Signed)
    SATURATION QUALIFICATIONS: (This note is used to comply with regulatory documentation for home oxygen)   Patient Saturations on Room Air at Rest = 95%   Patient Saturations on Room Air while Ambulating = 71 %   Patient Saturations on 2 Liters of oxygen while Ambulating = 93 %     Dx--COPD  Patient needs continuous O2 at 2 L/min continuously via nasal cannula with humidifier, with gaseous portability and conserving device    Rendall Carwin, MD

## 2024-01-30 NOTE — Progress Notes (Signed)
 PROGRESS NOTE  [name] [name], is a 73 y.o. male, DOB - 07-Apr-1951, FMW:[id number]  Admit date - 01/29/2024   Admitting Physician Lang JINNY Peel, DO  Outpatient Primary MD for the patient is Dettinger, [name], MD  LOS - 1  Chief Complaint  Patient presents with   Shortness of Breath      Brief Narrative:  73 y.o. male with medical history significant of COPD, CAD, GERD, chronic constipation, hypothyroidism, hypertension, and ongoing tobacco abuse admitted on 01/29/2024 with acute on chronic hypoxic respiratory failure in the setting of possible community-acquired pneumonia    -Assessment and Plan: 1)CAP----POA --treat empirically with azithromycin /Rocephin , mucolytics, bronchodilators - Cough , dyspnea and hypoxia persist  2)Acute COPD Exacerbation--- due to #1 above - Antibiotics,  mucolytics and bronchodilators as above #1 - IV Solu-Medrol  as ordered  3)Acute on chronic hypoxic respiratory failure--worsening hypoxia due to #1 and #2 above patient's Primary pulmonologist Dr. Aurea home O2 for patient back on 01/28/2024--- patient still awaiting delivery of home O2 - He does qualify for home O2 - Anticipate discharge home on home O2  4)Tobacco Abuse--  Smoking cessation counseling for 4 minutes today,  Give Nicotine  patch I have discussed tobacco cessation with the patient.  I have counseled the patient regarding the negative impacts of continued tobacco use including but not limited to lung cancer, COPD, and cardiovascular disease.  I have discussed alternatives to tobacco and modalities that may help facilitate tobacco cessation including but not limited to biofeedback, hypnosis, and medications.  Total time spent with tobacco counseling was 4 minutes.  5)GERD--Protonix  especially while on steroids  6)Hypothyroidism--- continue levothyroxine   Status is: Inpatient   Disposition: The patient is from: Home              Anticipated d/c is to: Home               Anticipated d/c date is: 1 day              Patient currently is not medically stable to d/c. Barriers: Not Clinically Stable-   Code Status :  -  Code Status: Full Code   Family Communication:     (patient is alert, awake and coherent)   Discussed with wife at bedside DVT Prophylaxis  :   - SCDs  enoxaparin  (LOVENOX ) injection 40 mg Start: 01/29/24 2100   Lab Results  Component Value Date   PLT 265 01/30/2024   Inpatient Medications  Scheduled Meds:  aspirin  EC  81 mg Oral Daily   Bempedoic Acid -Ezetimibe   1 tablet Oral Daily   budesonide -glycopyrrolate -formoterol   2 puff Inhalation BID AC & HS   enoxaparin  (LOVENOX ) injection  40 mg Subcutaneous Q24H   feeding supplement  237 mL Oral BID BM   ipratropium-albuterol   3 mL Nebulization TID   isosorbide  mononitrate  30 mg Oral Daily   levothyroxine   50 mcg Oral Daily   nicotine   21 mg Transdermal Daily   pantoprazole   40 mg Oral Daily   [START ON 01/31/2024] predniSONE   40 mg Oral Q breakfast   Continuous Infusions:  azithromycin      cefTRIAXone  (ROCEPHIN )  IV     cefTRIAXone  (ROCEPHIN )  IV     PRN Meds:.acetaminophen  **OR** acetaminophen , albuterol , ondansetron  **OR** ondansetron  (ZOFRAN ) IV, mouth rinse   Anti-infectives (From admission, onward)    Start     Dose/Rate Route Frequency Ordered Stop   01/30/24 1900  cefTRIAXone  (ROCEPHIN ) 2 g in sodium chloride  0.9 % 100 mL IVPB  2 g 200 mL/hr over 30 Minutes Intravenous Every 24 hours 01/29/24 2012 02/04/24 1859   01/30/24 1900  azithromycin  (ZITHROMAX ) 500 mg in sodium chloride  0.9 % 250 mL IVPB        500 mg 250 mL/hr over 60 Minutes Intravenous Every 24 hours 01/29/24 2012 02/04/24 1859   01/29/24 1900  cefTRIAXone  (ROCEPHIN ) 1 g in sodium chloride  0.9 % 100 mL IVPB        1 g 200 mL/hr over 30 Minutes Intravenous  Once 01/29/24 1851     01/29/24 1845  azithromycin  (ZITHROMAX ) 500 mg in sodium chloride  0.9 % 250 mL IVPB  Status:  Discontinued        500  mg 250 mL/hr over 60 Minutes Intravenous Every 24 hours 01/29/24 1835 01/30/24 1211   01/29/24 1845  cefTRIAXone  (ROCEPHIN ) 1 g in sodium chloride  0.9 % 100 mL IVPB        1 g 200 mL/hr over 30 Minutes Intravenous  Once 01/29/24 1835 01/29/24 1914      Subjective: [name] [name] today has no fevers, no emesis,  No chest pain,   Cough, dyspnea and hypoxia persist -Wife at bedside, questions answered  Objective: Vitals:   01/30/24 0449 01/30/24 0727 01/30/24 0731 01/30/24 1256  BP: (!) 165/96     Pulse: 85     Resp: 20     Temp: 97.8 F (36.6 C)     TempSrc: Oral     SpO2: 94% 94% 94% 95%  Weight:      Height:        Intake/Output Summary (Last 24 hours) at 01/30/2024 1414 Last data filed at 01/30/2024 1300 Gross per 24 hour  Intake 240 ml  Output 900 ml  Net -660 ml   Filed Weights   01/29/24 1719 01/29/24 2019  Weight: 64.4 kg 62.9 kg    Physical Exam  Gen:- Awake Alert,  in no apparent distress  HEENT:- [place].AT, No sclera icterus Nose-[place] 2L/min Neck-Supple Neck,No JVD,.  Lungs-diminished breath sounds with scattered wheezes and rhonchi bilaterally CV- S1, S2 normal, regular  Abd-  +ve B.Sounds, Abd Soft, No tenderness,    Extremity/Skin:- No  edema, pedal pulses present  Psych-affect is appropriate, oriented x3 Neuro-no new focal deficits, no tremors  Data Reviewed: I have personally reviewed following labs and imaging studies  CBC: Recent Labs  Lab 01/29/24 1727 01/30/24 0442  WBC 10.9* 4.3  HGB 14.9 14.9  HCT 43.5 43.6  MCV 91.8 90.3  PLT 254 265   Basic Metabolic Panel: Recent Labs  Lab 01/29/24 1727 01/30/24 0442  NA 141 141  K 3.1* 4.2  CL 105 107  CO2 24 24  GLUCOSE 103* 166*  BUN 14 16  CREATININE 1.01 1.07  CALCIUM  9.4 9.8   GFR: Estimated Creatinine Clearance: 54.7 mL/min (by C-G formula based on SCr of 1.07 mg/dL).  Radiology Studies: DG Chest 2 View Result Date: 01/29/2024 EXAM: 2 VIEW(S) XRAY OF THE CHEST 01/29/2024 05:55:50  PM COMPARISON: 12/31/2023 CLINICAL HISTORY: Pt c/o SOB and prob cough x2 weeks. Saw pulmonologist yesterday and they ordered home O2 but pt has not received it yet. Hx of COPD. FINDINGS: LUNGS AND PLEURA: Asymmetric biapical scarring, right greater than left, stable. Coarse pulmonary interstitial markings throughout. Some worsening of interstitial edema or infiltrate in the anterior right upper lobe and right middle lobe. Some thickening of or fluid in the interlobar fissures. Mild blunting of the posterior costophrenic angles suggesting small effusions Mild hyperinflation. No  pneumothorax. HEART AND MEDIASTINUM: No acute abnormality of the cardiac and mediastinal silhouettes. BONES AND SOFT TISSUES: No acute osseous abnormality. IMPRESSION: 1. Worsening interstitial edema or infiltrate in the anterior right upper lobe and right middle lobe. 2. Mild blunting of the posterior costophrenic angles suggesting small effusions. 3. Stable asymmetric biapical scarring, right greater than left. Electronically signed by: Dayne Hassell MD 01/29/2024 06:02 PM EDT RP Workstation: [id number]   Scheduled Meds:  aspirin  EC  81 mg Oral Daily   Bempedoic Acid -Ezetimibe   1 tablet Oral Daily   budesonide -glycopyrrolate -formoterol   2 puff Inhalation BID AC & HS   enoxaparin  (LOVENOX ) injection  40 mg Subcutaneous Q24H   feeding supplement  237 mL Oral BID BM   ipratropium-albuterol   3 mL Nebulization TID   isosorbide  mononitrate  30 mg Oral Daily   levothyroxine   50 mcg Oral Daily   nicotine   21 mg Transdermal Daily   pantoprazole   40 mg Oral Daily   [START ON 01/31/2024] predniSONE   40 mg Oral Q breakfast   Continuous Infusions:  azithromycin      cefTRIAXone  (ROCEPHIN )  IV     cefTRIAXone  (ROCEPHIN )  IV      LOS: 1 day   Rendall Carwin M.D on 01/30/2024 at 2:14 PM  Go to [email or web address] - for contact info  [facility] - Office  [phone]  If 7PM-7AM, please contact  night-coverage [email or web address] 01/30/2024, 2:14 PM

## 2024-01-31 DIAGNOSIS — J9601 Acute respiratory failure with hypoxia: Secondary | ICD-10-CM | POA: Diagnosis not present

## 2024-01-31 MED ORDER — AZITHROMYCIN 500 MG PO TABS: 500.0000 mg | ORAL_TABLET | Freq: Every day | ORAL | 0 refills | Status: AC

## 2024-01-31 MED ORDER — VALSARTAN 160 MG PO TABS: 160.0000 mg | ORAL_TABLET | Freq: Every day | ORAL | 11 refills | Status: DC

## 2024-01-31 MED ORDER — TRAZODONE HCL 50 MG PO TABS: 50.0000 mg | ORAL_TABLET | Freq: Every day | ORAL | 5 refills | Status: DC

## 2024-01-31 MED ORDER — LEVOTHYROXINE SODIUM 50 MCG PO TABS
50.0000 ug | ORAL_TABLET | Freq: Every day | ORAL | 3 refills | Status: AC
Start: 2024-01-31 — End: ?

## 2024-01-31 MED ORDER — CEFDINIR 300 MG PO CAPS: 300.0000 mg | ORAL_CAPSULE | Freq: Two times a day (BID) | ORAL | 0 refills | Status: AC

## 2024-01-31 MED ORDER — GUAIFENESIN ER 600 MG PO TB12: 600.0000 mg | ORAL_TABLET | Freq: Two times a day (BID) | ORAL | 2 refills | Status: DC

## 2024-01-31 MED ORDER — NICOTINE 21 MG/24HR TD PT24: 21.0000 mg | MEDICATED_PATCH | Freq: Every day | TRANSDERMAL | 0 refills | Status: DC

## 2024-01-31 MED ORDER — ALBUTEROL SULFATE (2.5 MG/3ML) 0.083% IN NEBU: 2.5000 mg | INHALATION_SOLUTION | RESPIRATORY_TRACT | 1 refills | Status: DC | PRN

## 2024-01-31 MED ORDER — ASPIRIN EC 81 MG PO TBEC: 81.0000 mg | DELAYED_RELEASE_TABLET | Freq: Every day | ORAL | 5 refills | Status: DC

## 2024-01-31 MED ORDER — PREDNISONE 20 MG PO TABS: 40.0000 mg | ORAL_TABLET | Freq: Every day | ORAL | 0 refills | Status: AC

## 2024-01-31 MED ORDER — OMEPRAZOLE 20 MG PO CPDR: 20.0000 mg | DELAYED_RELEASE_CAPSULE | Freq: Every day | ORAL | 5 refills | Status: AC

## 2024-01-31 MED ORDER — BREZTRI AEROSPHERE 160-9-4.8 MCG/ACT IN AERO: 2.0000 | INHALATION_SPRAY | Freq: Two times a day (BID) | RESPIRATORY_TRACT | 11 refills | Status: DC

## 2024-01-31 MED ORDER — ISOSORBIDE MONONITRATE ER 30 MG PO TB24: 30.0000 mg | ORAL_TABLET | Freq: Every day | ORAL | 5 refills | Status: DC

## 2024-01-31 NOTE — Discharge Summary (Signed)
 [name] [name], is a 73 y.o. male  DOB 1951/02/20  MRN [id number].  Admission date:  01/29/2024  Admitting Physician  Lang JINNY Peel, DO  Discharge Date:  01/31/2024   Primary MD  Dettinger, [name], MD  Recommendations for primary care physician for things to follow:  - 1)Avoid ibuprofen /Advil /Aleve /Motrin Josefine Powders/Naproxen /BC powders/Meloxicam/Diclofenac/Indomethacin and other Nonsteroidal anti-inflammatory medications as these will make you more likely to bleed and can cause stomach ulcers, can also cause Kidney problems.   2)You need oxygen at home at 2 L via nasal cannula continuously while awake and while asleep--- smoking or having open fires around oxygen can cause fire, significant injury and death  3)Complete abstinence from tobacco advised--okay to use over-the-counter nicotine  patch to help you stay quit-  4) follow-up with your pulmonologist Dr. Shelah as previously scheduled  Admission Diagnosis  COPD exacerbation (HCC) [J44.1] Acute respiratory failure with hypoxia (HCC) [J96.01] Community acquired pneumonia of right lung, unspecified part of lung [J18.9] Acute hypoxic respiratory failure (HCC) [J96.01]   Discharge Diagnosis  COPD exacerbation (HCC) [J44.1] Acute respiratory failure with hypoxia (HCC) [J96.01] Community acquired pneumonia of right lung, unspecified part of lung [J18.9] Acute hypoxic respiratory failure (HCC) [J96.01]    Principal Problem:   Acute respiratory failure with hypoxia (HCC) Active Problems:   TOBACCO ABUSE   Hypothyroidism   Coronary artery disease involving native coronary artery of native heart with angina pectoris (HCC)   Essential hypertension   GERD (gastroesophageal reflux disease)   COPD (chronic obstructive pulmonary disease) (HCC)   Bilateral carotid artery stenosis   COPD with acute exacerbation (HCC)   CAP (community acquired  pneumonia)      Past Medical History:  Diagnosis Date   Arthritis    Carotid artery stenosis without cerebral infarction    vascular --- dr eliza;   09-24-2007  s/p right CEA   Chronic cholecystitis    w/  large calcified stone in gallbladder   Chronic constipation    COPD (chronic obstructive pulmonary disease) (HCC)    followed by pcp----  (05-28-2021  pt stated last used rescue 05-27-2021 for wheezing resolved after use, stated exacerbation approx  greater than a year ago)   Coronary artery disease 2002   cardiologist-- dr lavona ;  (05-28-2021  pt denies need to use nitro for CP) per last cath 08-04-2017  LM luminal irregularities, LAD 30% ,D1 ostial 35% ,CFx   AV groove ostial 25% , OM1 long prox 30%,  proxRCA occluded and dominant,   excellent left to right collateral filling.   Depression    Patient denies   DOE (dyspnea on exertion)    due to smoking/ copd   Dyslipidemia    Essential hypertension    Full dentures    GERD (gastroesophageal reflux disease)    History of adenomatous polyp of colon    History of COVID-19    per pt winter 2022, mild to moderate symptoms that resolved   History of diverticulitis of colon    History of kidney stones  Hyperlipidemia    Hypothyroidism    followed by pcp   Lung nodule    s/p bronchoscopy w/ bx in 2005,  benign   Osteoporosis    Smokers' cough (HCC)    05-28-2021  pt stated occasionally productive w/ white phlegm   Wears glasses     Past Surgical History:  Procedure Laterality Date   BRONCHOSCOPY  2005   CARDIAC CATHETERIZATION  08/11/2000   @[facility]  by dr morris;   preserved LVF,  ostial RCA 70-75%, other nonobstructive disease   CARDIAC CATHETERIZATION  03/30/2008   @[facility]  by dr hochrein;  occluded RCA and other nonobstructive involving LAD, CFx, D1,OM1  and left to right collateral filling,  mild inferior hypokinesis,  ef 55%   CAROTID ENDARTERECTOMY Right 09/24/2007   @[facility]  by dr eliza   COLONOSCOPY  01/19/2017   by  armsbruster   EXCISION NEUROMA Left 10/01/2005   @[facility] ;  left inguinal   EXTRACORPOREAL SHOCK WAVE LITHOTRIPSY  2015   INGUINAL HERNIA REPAIR Left    01-13-2003 and 07-22-2006 both @[facility]    LAPAROSCOPIC CHOLECYSTECTOMY SINGLE SITE WITH INTRAOPERATIVE CHOLANGIOGRAM N/A 05/30/2021   Procedure: LAPAROSCOPIC CHOLECYSTECTOMY SINGLE SITE WITH INTRAOPERATIVE CHOLANGIOGRAM;  Surgeon: Sheldon Standing, MD;  Location: [facility];  Service: General;  Laterality: N/A;   LAPAROSCOPIC INGUINAL HERNIA REPAIR Bilateral 04/28/2007   @[facility]    LEFT HEART CATH AND CORONARY ANGIOGRAPHY N/A 08/04/2017   Procedure: LEFT HEART CATH AND CORONARY ANGIOGRAPHY;  Surgeon: Verlin Lonni BIRCH, MD;  Location: [facility];  Service: Cardiovascular;  Laterality: N/A;   THROAT SURGERY  1997   pt stated removal of tumor that was benign     HPI  from the history and physical done on the day of admission:    HPI: [name] [name] is a 73 y.o. male with medical history significant of COPD, CAD, GERD, chronic constipation, hypothyroidism, hypertension, smoker.  Patient has been experiencing increasing shortness of breath over the last 2 weeks.  Over the last 2 to 3 days, he has been waking up in the middle of the night gasping for breath and in a panic attack due to his breathing.  In addition, he has been getting more short of breath with exertion.  He went to his pulmonary doctor yesterday and was told he needed home oxygen.  He was ordered, but he was unable to get it.  He denies fevers, chills.  He does have a cough.  Denies chest pain.  He does have chronic abdominal bloating, particularly after eating breakfast.  He does experience constipation that he takes Colace for.   Review of Systems: As mentioned in the history of present illness. All other systems reviewed and are negative.    Hospital Course:     Brief Narrative:  73 y.o. male with medical history significant of COPD, CAD, GERD, chronic  constipation, hypothyroidism, hypertension, and ongoing tobacco abuse admitted on 01/29/2024 with acute on chronic hypoxic respiratory failure in the setting of possible community-acquired pneumonia     -Assessment and Plan: 1)CAP----POA -Treated with azithromycin /Rocephin , mucolytics, bronchodilators - Much improved cough , dyspnea and hypoxia -Okay to discharge on Omnicef /azithromycin  along with mucolytics and bronchodilators  2)Acute COPD Exacerbation--- due to #1 above - Treated with antibiotics,  mucolytics and bronchodilators as above #1 - Treated with IV Solu-Medrol  as ordered -Overall much improved from a respiratory standpoint -Discharged on antibiotics as above #1 - Discharge on prednisone  mucolytics and bronchodilators   3)Acute on chronic hypoxic respiratory failure--worsening  hypoxia due to #1 and #2 above patient's Primary pulmonologist Dr. Aurea home O2 for patient back on 01/28/2024--- patient still awaiting delivery of home O2 - He does qualify for home O2 - -Oxygen requirement back to baseline of 2 L with activity -Home O2 requested -Outpatient follow-up with pulmonologist Dr. Shelah advised   4)Tobacco Abuse--  Smoking cessation counseling for 4 minutes today,  Give Nicotine  patch I have discussed tobacco cessation with the patient.  I have counseled the patient regarding the negative impacts of continued tobacco use including but not limited to lung cancer, COPD, and cardiovascular disease.  I have discussed alternatives to tobacco and modalities that may help facilitate tobacco cessation including but not limited to biofeedback, hypnosis, and medications.  Total time spent with tobacco counseling was 4 minutes.   5)GERD--continue PPI  6)Hypothyroidism--- continue levothyroxine    Disposition: The patient is from: Home              Anticipated d/c is to: Home  Discharge Condition: stable  Follow UP--- pulmonologist Dr. Shelah as advised  Diet and Activity  recommendation:  As advised  Discharge Instructions    Discharge Instructions     Call MD for:  difficulty breathing, headache or visual disturbances   Complete by: As directed    Call MD for:  persistant dizziness or light-headedness   Complete by: As directed    Call MD for:  persistant nausea and vomiting   Complete by: As directed    Call MD for:  temperature >100.4   Complete by: As directed    Diet - low sodium heart healthy   Complete by: As directed    Discharge instructions   Complete by: As directed    1)Avoid ibuprofen /Advil /Aleve /Motrin Josefine Powders/Naproxen /BC powders/Meloxicam/Diclofenac/Indomethacin and other Nonsteroidal anti-inflammatory medications as these will make you more likely to bleed and can cause stomach ulcers, can also cause Kidney problems.   2)You need oxygen at home at 2 L via nasal cannula continuously while awake and while asleep--- smoking or having open fires around oxygen can cause fire, significant injury and death  3)Complete abstinence from tobacco advised--okay to use over-the-counter nicotine  patch to help you stay quit-  4) follow-up with your pulmonologist Dr. Byrum as previously scheduled   Increase activity slowly   Complete by: As directed          Discharge Medications     Allergies as of 01/31/2024       Reactions   Flomax  [tamsulosin  Hcl] Other (See Comments)   Made patient hypotensive, clammy, sweaty, and feel like he was going to pass out (EMS had to be called)   Crestor [rosuvastatin Calcium ] Other (See Comments)   Lethargy and muscle aches   Lipitor [atorvastatin Calcium ] Other (See Comments)   Body cramps, lethargy, and muscle aches        Medication List     TAKE these medications    albuterol  (2.5 MG/3ML) 0.083% nebulizer solution Commonly known as: PROVENTIL  Take 3 mLs (2.5 mg total) by nebulization every 4 (four) hours as needed for wheezing or shortness of breath. What changed: when to take this    aspirin  EC 81 MG tablet Take 1 tablet (81 mg total) by mouth daily with breakfast. Swallow whole. What changed: when to take this   azithromycin  500 MG tablet Commonly known as: ZITHROMAX  Take 1 tablet (500 mg total) by mouth daily for 3 days.   Breztri  Aerosphere 160-9-4.8 MCG/ACT Aero inhaler Generic drug: budesonide -glycopyrrolate -formoterol  Inhale 2 puffs  into the lungs in the morning and at bedtime.   cefdinir  300 MG capsule Commonly known as: OMNICEF  Take 1 capsule (300 mg total) by mouth 2 (two) times daily for 5 days.   fluticasone  50 MCG/ACT nasal spray Commonly known as: FLONASE  Place 2 sprays into both nostrils daily.   guaiFENesin  600 MG 12 hr tablet Commonly known as: Mucinex  Take 1 tablet (600 mg total) by mouth 2 (two) times daily.   isosorbide  mononitrate 30 MG 24 hr tablet Commonly known as: IMDUR  Take 1 tablet (30 mg total) by mouth daily. What changed: when to take this   levothyroxine  50 MCG tablet Commonly known as: SYNTHROID  Take 1 tablet (50 mcg total) by mouth daily before breakfast. What changed: when to take this   Nexlizet  180-10 MG Tabs Generic drug: Bempedoic Acid -Ezetimibe  TAKE ONE TABLET DAILY   nicotine  21 mg/24hr patch Commonly known as: NICODERM CQ  - dosed in mg/24 hours Place 1 patch (21 mg total) onto the skin daily.   omeprazole  20 MG capsule Commonly known as: PRILOSEC Take 1 capsule (20 mg total) by mouth daily.   predniSONE  20 MG tablet Commonly known as: DELTASONE  Take 2 tablets (40 mg total) by mouth daily with breakfast for 5 days.   traZODone  50 MG tablet Commonly known as: DESYREL  Take 1 tablet (50 mg total) by mouth at bedtime.   valsartan  160 MG tablet Commonly known as: DIOVAN  Take 1 tablet (160 mg total) by mouth daily.               Durable Medical Equipment  (From admission, onward)           Start     Ordered   01/30/24 1548  For home use only DME oxygen  Once       Comments: SATURATION  QUALIFICATIONS: (This note is used to comply with regulatory documentation for home oxygen)   Patient Saturations on Room Air at Rest = 95%   Patient Saturations on Room Air while Ambulating = 71 %   Patient Saturations on 2 Liters of oxygen while Ambulating = 93 %     Dx--COPD   Patient needs continuous O2 at 2 L/min continuously via nasal cannula with humidifier, with gaseous portability and conserving device  Question Answer Comment  Length of Need Lifetime   Mode or (Route) Nasal cannula   Liters per Minute 2   Frequency Continuous (stationary and portable oxygen unit needed)   Oxygen conserving device Yes   Oxygen delivery system Gas      01/30/24 1547            Major procedures and Radiology Reports - PLEASE review detailed and final reports for all details, in brief -   DG Chest 2 View Result Date: 01/29/2024 EXAM: 2 VIEW(S) XRAY OF THE CHEST 01/29/2024 05:55:50 PM COMPARISON: 12/31/2023 CLINICAL HISTORY: Pt c/o SOB and prob cough x2 weeks. Saw pulmonologist yesterday and they ordered home O2 but pt has not received it yet. Hx of COPD. FINDINGS: LUNGS AND PLEURA: Asymmetric biapical scarring, right greater than left, stable. Coarse pulmonary interstitial markings throughout. Some worsening of interstitial edema or infiltrate in the anterior right upper lobe and right middle lobe. Some thickening of or fluid in the interlobar fissures. Mild blunting of the posterior costophrenic angles suggesting small effusions Mild hyperinflation. No pneumothorax. HEART AND MEDIASTINUM: No acute abnormality of the cardiac and mediastinal silhouettes. BONES AND SOFT TISSUES: No acute osseous abnormality. IMPRESSION: 1. Worsening interstitial edema or  infiltrate in the anterior right upper lobe and right middle lobe. 2. Mild blunting of the posterior costophrenic angles suggesting small effusions. 3. Stable asymmetric biapical scarring, right greater than left. 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 The study is intermediate risk. Risk based on decreased LVEF, there is no current myocardium at jeopardy. Consider correlating LVEF with echo.   No ST deviation was noted.   LV perfusion is abnormal. Large severe intensity inferior/inferoapical defect that is fixed. Defect complicated by adjacent gut radiotracer uptake, however wall motion abnormality would support prior infarct. No current ischemia.   Left ventricular function is abnormal. Nuclear stress EF: 45%. The left ventricular ejection fraction is mildly decreased (45-54%). End diastolic cavity size is normal.   ECHOCARDIOGRAM COMPLETE Result Date: 01/01/2024    ECHOCARDIOGRAM REPORT   Patient Name:   Orlander T Cavness Date of Exam: 01/01/2024 Medical Rec #:  [id number]          Height:       70.0 in Accession #:    [id number]         Weight:       134.7 lb Date of Birth:  [birth year]          BSA:          1.765 m Patient Age:    73 years           BP:           159/79 mmHg Patient Gender: M                  HR:           86 bpm. Exam Location:  Inpatient Procedure: 2D Echo, Intracardiac Opacification Agent, Cardiac Doppler and Color            Doppler (Both Spectral and Color Flow Doppler were utilized during            procedure). Indications:    Chest Pain R07.9  History:        Patient has prior history of Echocardiogram examinations, most                 recent 03/09/2013. COPD, Signs/Symptoms:Chest Pain and Dyspnea;                 Risk Factors:Current Smoker, Hypertension and Dyslipidemia.  Sonographer:    Koleen Popper RDCS Referring Phys: [id number] LAYMON CHRISTELLA QUA  Sonographer Comments: Image acquisition challenging due to respiratory motion and Image acquisition challenging due to COPD. IMPRESSIONS  1. Challenging study due to COPD. Poor Echo  images. Limited interrogation of valves.  2. Left ventricular ejection fraction, by estimation, is 55 to 60%. The left ventricle has normal function. The left ventricle has no regional wall motion abnormalities. Left ventricular diastolic parameters are consistent with Grade I diastolic dysfunction (impaired relaxation).  3. Right ventricular systolic function is normal. The right ventricular size is normal. There is normal pulmonary artery systolic pressure.  4. The mitral valve was not well visualized. No evidence of mitral valve regurgitation. No evidence of mitral stenosis.  5. The aortic valve was not well visualized. Aortic valve regurgitation is not visualized. No aortic stenosis is present.  6. The inferior vena cava is normal in size with greater than 50% respiratory variability, suggesting right atrial pressure of 3  mmHg. FINDINGS  Left Ventricle: Left ventricular ejection fraction, by estimation, is 55 to 60%. The left ventricle has normal function. The left ventricle has no regional wall motion abnormalities. Definity  contrast agent was given IV to delineate the left ventricular  endocardial borders. Strain was performed and the global longitudinal strain is indeterminate. The left ventricular internal cavity size was normal in size. There is no left ventricular hypertrophy. Left ventricular diastolic parameters are consistent with Grade I diastolic dysfunction (impaired relaxation). Normal left ventricular filling pressure. Right Ventricle: The right ventricular size is normal. No increase in right ventricular wall thickness. Right ventricular systolic function is normal. There is normal pulmonary artery systolic pressure. The tricuspid regurgitant velocity is 2.19 m/s, and  with an assumed right atrial pressure of 3 mmHg, the estimated right ventricular systolic pressure is 22.2 mmHg. Left Atrium: Left atrial size was normal in size. Right Atrium: Right atrial size was normal in size. Pericardium:  There is no evidence of pericardial effusion. Mitral Valve: The mitral valve was not well visualized. No evidence of mitral valve regurgitation. No evidence of mitral valve stenosis. Tricuspid Valve: The tricuspid valve is not well visualized. Tricuspid valve regurgitation is not demonstrated. No evidence of tricuspid stenosis. Aortic Valve: The aortic valve was not well visualized. Aortic valve regurgitation is not visualized. No aortic stenosis is present. Pulmonic Valve: The pulmonic valve was not well visualized. Pulmonic valve regurgitation is not visualized. No evidence of pulmonic stenosis. Aorta: The aortic root is normal in size and structure. Venous: The inferior vena cava is normal in size with greater than 50% respiratory variability, suggesting right atrial pressure of 3 mmHg. IAS/Shunts: No atrial level shunt detected by color flow Doppler. Additional Comments: 3D was performed not requiring image post processing on an independent workstation and was indeterminate.  LEFT VENTRICLE PLAX 2D LVIDd:         4.50 cm   Diastology LVIDs:         3.00 cm   LV e' medial:    4.14 cm/s LV PW:         1.00 cm   LV E/e' medial:  8.6 LV IVS:        1.00 cm   LV e' lateral:   4.54 cm/s LVOT diam:     1.90 cm   LV E/e' lateral: 7.8 LVOT Area:     2.84 cm  IVC IVC diam: 1.60 cm LEFT ATRIUM           Index LA diam:      3.00 cm 1.70 cm/m LA Vol (A4C): 18.9 ml 10.71 ml/m   AORTA Ao Root diam: 3.10 cm MITRAL VALVE               TRICUSPID VALVE MV Area (PHT): 3.42 cm    TR Peak grad:   19.2 mmHg MV Decel Time: 222 msec    TR Vmax:        219.00 cm/s MV E velocity: 35.60 cm/s MV A velocity: 81.70 cm/s  SHUNTS MV E/A ratio:  0.44        Systemic Diam: 1.90 cm Vishnu Priya Mallipeddi Electronically signed by Diannah Late Mallipeddi Signature Date/Time: 01/01/2024/11:41:34 AM    Final     Today   Subjective    Reyes Galley today has no new complaints No fever  Or chills   No Nausea, Vomiting or Diarrhea --  Cough has improved significantly - No dyspnea at rest - Dyspnea on exertion improved -  Ambulating on 2 L of oxygen with O2 sats above 92%-       Patient has been seen and examined prior to discharge   Objective   Blood pressure 125/81, pulse 93, temperature (!) 97.5 F (36.4 C), temperature source Oral, resp. rate 19, height 5' 10 (1.778 m), weight 62.9 kg, SpO2 95%.   Intake/Output Summary (Last 24 hours) at 01/31/2024 1044 Last data filed at 01/31/2024 0902 Gross per 24 hour  Intake 540 ml  Output 1825 ml  Net -1285 ml    Exam Gen:- Awake Alert, no acute distress, no conversational dyspnea HEENT:- [place].AT, No sclera icterus Nose---[place] 2L/min Neck-Supple Neck,No JVD,.  Lungs-improved air movement, no wheezing  CV- S1, S2 normal, regular Abd-  +ve B.Sounds, Abd Soft, No tenderness,    Extremity/Skin:- No  edema,   good pulses Psych-affect is appropriate, oriented x3 Neuro-no new focal deficits, no tremors    Data Review   CBC w Diff:  Lab Results  Component Value Date   WBC 4.3 01/30/2024   HGB 14.9 01/30/2024   HGB 15.4 10/02/2023   HCT 43.6 01/30/2024   HCT 46.8 10/02/2023   PLT 265 01/30/2024   PLT 240 10/02/2023   LYMPHOPCT 33 12/31/2023   MONOPCT 10 12/31/2023   EOSPCT 2 12/31/2023   BASOPCT 1 12/31/2023   CMP:  Lab Results  Component Value Date   NA 141 01/30/2024   NA 137 10/08/2023   K 4.2 01/30/2024   CL 107 01/30/2024   CO2 24 01/30/2024   BUN 16 01/30/2024   BUN 19 10/08/2023   CREATININE 1.07 01/30/2024   PROT 6.6 12/31/2023   PROT 6.6 10/08/2023   ALBUMIN 3.3 (L) 12/31/2023   ALBUMIN 4.1 10/08/2023   BILITOT 0.7 12/31/2023   BILITOT 0.4 10/08/2023   ALKPHOS 84 12/31/2023   AST 15 12/31/2023   ALT 10 12/31/2023   Total Discharge time is about 33 minutes  Rendall Carwin M.D on 01/31/2024 at 10:44 AM  Go to [email or web address] -  for contact info  [facility] - Office  [phone]

## 2024-01-31 NOTE — Plan of Care (Signed)

## 2024-01-31 NOTE — TOC Transition Note (Signed)
 Transition of Care [facility]) - Discharge Note   Patient Details  Name: [name] [name] MRN: [id number] Date of Birth: 16-Jul-1950  Transition of Care [facility]) CM/SW Contact:  Nena LITTIE Coffee, RN Phone Number: 01/31/2024, 3:35 PM   Clinical Narrative:    Pt dc'd home c/[employer] providing oxygen.    Final next level of care: Home/Self Care Barriers to Discharge: Barriers Resolved   Patient Goals and CMS Choice            Discharge Placement                       Discharge Plan and Services Additional resources added to the After Visit Summary for                  DME Arranged: Oxygen DME Agency: [employer] Date DME Agency Contacted: 01/30/24   Representative spoke with at DME Agency: Dorothe            Social Drivers of Health (SDOH) Interventions SDOH Screenings   Food Insecurity: No Food Insecurity (01/30/2024)  Housing: Low Risk  (01/30/2024)  Transportation Needs: No Transportation Needs (01/30/2024)  Utilities: Not At Risk (01/30/2024)  Alcohol Screen: Low Risk  (04/01/2023)  Depression (PHQ2-9): Low Risk  (09/02/2023)  Financial Resource Strain: Low Risk  (04/01/2023)  Physical Activity: Sufficiently Active (04/01/2023)  Social Connections: Socially Integrated (01/30/2024)  Stress: No Stress Concern Present (04/01/2023)  Tobacco Use: High Risk (01/30/2024)  Health Literacy: Adequate Health Literacy (04/01/2023)     Readmission Risk Interventions    01/30/2024    3:58 PM  Readmission Risk Prevention Plan  Post Dischage Appt Complete  Medication Screening Complete  Transportation Screening Complete

## 2024-01-31 NOTE — Plan of Care (Signed)
   Problem: Education: Goal: Knowledge of General Education information will improve Description: Including pain rating scale, medication(s)/side effects and non-pharmacologic comfort measures Outcome: Adequate for Discharge   Problem: Health Behavior/Discharge Planning: Goal: Ability to manage health-related needs will improve Outcome: Adequate for Discharge   Problem: Clinical Measurements: Goal: Ability to maintain clinical measurements within normal limits will improve Outcome: Adequate for Discharge Goal: Will remain free from infection Outcome: Adequate for Discharge Goal: Diagnostic test results will improve Outcome: Adequate for Discharge Goal: Respiratory complications will improve Outcome: Adequate for Discharge Goal: Cardiovascular complication will be avoided Outcome: Adequate for Discharge   Problem: Activity: Goal: Risk for activity intolerance will decrease Outcome: Adequate for Discharge   Problem: Nutrition: Goal: Adequate nutrition will be maintained Outcome: Adequate for Discharge   Problem: Coping: Goal: Level of anxiety will decrease Outcome: Adequate for Discharge   Problem: Elimination: Goal: Will not experience complications related to bowel motility Outcome: Adequate for Discharge Goal: Will not experience complications related to urinary retention Outcome: Adequate for Discharge   Problem: Pain Managment: Goal: General experience of comfort will improve and/or be controlled Outcome: Adequate for Discharge   Problem: Safety: Goal: Ability to remain free from injury will improve Outcome: Adequate for Discharge   Problem: Skin Integrity: Goal: Risk for impaired skin integrity will decrease Outcome: Adequate for Discharge   Problem: Education: Goal: Knowledge of disease or condition will improve Outcome: Adequate for Discharge Goal: Knowledge of the prescribed therapeutic regimen will improve Outcome: Adequate for Discharge Goal:  Individualized Educational Video(s) Outcome: Adequate for Discharge   Problem: Activity: Goal: Ability to tolerate increased activity will improve Outcome: Adequate for Discharge Goal: Will verbalize the importance of balancing activity with adequate rest periods Outcome: Adequate for Discharge   Problem: Respiratory: Goal: Ability to maintain a clear airway will improve Outcome: Adequate for Discharge Goal: Levels of oxygenation will improve Outcome: Adequate for Discharge Goal: Ability to maintain adequate ventilation will improve Outcome: Adequate for Discharge   Problem: Activity: Goal: Ability to tolerate increased activity will improve Outcome: Adequate for Discharge   Problem: Clinical Measurements: Goal: Ability to maintain a body temperature in the normal range will improve Outcome: Adequate for Discharge   Problem: Respiratory: Goal: Ability to maintain adequate ventilation will improve Outcome: Adequate for Discharge Goal: Ability to maintain a clear airway will improve Outcome: Adequate for Discharge

## 2024-02-01 ENCOUNTER — Telehealth: Payer: Self-pay | Admitting: Emergency Medicine

## 2024-02-01 ENCOUNTER — Telehealth: Payer: Self-pay

## 2024-02-01 NOTE — Transitions of Care (Post Inpatient/ED Visit) (Signed)
   02/01/2024  Name: [name] [name] MRN: [id number] DOB: 1950/07/19  Today's TOC FU Call Status: Today's TOC FU Call Status:: Unsuccessful Call (1st Attempt) Unsuccessful Call (1st Attempt) Date: 02/01/24  Attempted to reach the patient regarding the most recent Inpatient/ED visit.  Follow Up Plan: Additional outreach attempts will be made to reach the patient to complete the Transitions of Care (Post Inpatient/ED visit) call.   [name] Ee, RN, BSN, CEN [employer]- Transition of Care Team.  [facility] [phone]

## 2024-02-01 NOTE — Telephone Encounter (Signed)
 Copied from CRM [id number]. Topic: General - Other >> Feb 01, 2024  8:11 AM [name] [name] wrote: Reason for CRM: Patient is calling because he recently received his oxygen tank but he is needing an order for a POC (portable oxygen concentrator). Order needs to be sent to [facility]. Patient be reached at [phone].

## 2024-02-02 ENCOUNTER — Telehealth: Payer: Self-pay

## 2024-02-02 ENCOUNTER — Telehealth: Payer: Self-pay | Admitting: *Deleted

## 2024-02-02 DIAGNOSIS — J441 Chronic obstructive pulmonary disease with (acute) exacerbation: Secondary | ICD-10-CM

## 2024-02-02 LAB — LEGIONELLA PNEUMOPHILA SEROGP 1 UR AG: L. pneumophila Serogp 1 Ur Ag: NEGATIVE

## 2024-02-02 NOTE — Telephone Encounter (Signed)
 Copied from CRM [id number]. Topic: General - Other >> Feb 01, 2024  8:11 AM Antonio H wrote: Reason for CRM: Patient is calling because he recently received his oxygen tank but he is needing an order for a POC (portable oxygen concentrator). Order needs to be sent to [facility]. Patient be reached at [phone]. >> Feb 02, 2024 11:07 AM Corean SAUNDERS wrote: Patient is requesting an update on his order for a POC.

## 2024-02-02 NOTE — Telephone Encounter (Signed)
 Patient was seen by Dr. Shelah on 01/28/24 and was walked but no POC order was placed

## 2024-02-02 NOTE — Telephone Encounter (Signed)
 Dr Shelah, did you intend to place POC order at his recent visit. He was walked, and says he qualified with 2 lpm o2 but the notes do not specify if the o2 was pulsed. Do you want to order a best fit eval for him? Please advise, thank you.

## 2024-02-02 NOTE — Transitions of Care (Post Inpatient/ED Visit) (Signed)
 02/02/2024  Name: [name] [name] MRN: [id number] DOB: Apr 01, 1951  Today's TOC FU Call Status: Today's TOC FU Call Status:: Successful TOC FU Call Completed TOC FU Call Complete Date: 02/02/24 Patient's Name and Date of Birth confirmed.  Transition Care Management Follow-up Telephone Call Date of Discharge: 01/31/24 [facility] Type of Discharge: Inpatient Admission Primary Inpatient Discharge Diagnosis:: Acute respiratory failure with hypoxia How have you been since you were released from the hospital?:  (pt states  I feel great, breathing so much better  appetite good, no issues with bowel/ bladder function, independent with all aspects of care) Any questions or concerns?: No  Items Reviewed: Did you receive and understand the discharge instructions provided?: Yes Medications obtained,verified, and reconciled?: Yes (Medications Reviewed) Any new allergies since your discharge?: No Dietary orders reviewed?: Yes Type of Diet Ordered:: heart healthy Do you have support at home?: Yes People in Home [RPT]: spouse Name of Support/Comfort Primary Source: [name]  Medications Reviewed Today: Medications Reviewed Today     Reviewed by Aura Mliss LABOR, RN (Registered Nurse) on 02/02/24 at 1508  Med List Status: <None>   Medication Order Taking? Sig Documenting Provider Last Dose Status Informant  albuterol  (PROVENTIL ) (2.5 MG/3ML) 0.083% nebulizer solution [id number] Yes Take 3 mLs (2.5 mg total) by nebulization every 4 (four) hours as needed for wheezing or shortness of breath. Pearlean Manus, MD  Active   aspirin  EC 81 MG tablet [id number] Yes Take 1 tablet (81 mg total) by mouth daily with breakfast. Swallow whole. Pearlean Manus, MD  Active   azithromycin  (ZITHROMAX ) 500 MG tablet [id number] Yes Take 1 tablet (500 mg total) by mouth daily for 3 days. Pearlean Manus, MD  Active   Bempedoic Acid -Ezetimibe  (NEXLIZET ) 180-10 MG TABS [id number] Yes  TAKE ONE TABLET DAILY Dettinger, [name], MD  Active Self, Pharmacy Records  budesonide -glycopyrrolate -formoterol  (BREZTRI  AEROSPHERE) 160-9-4.8 MCG/ACT AERO inhaler [id number] Yes Inhale 2 puffs into the lungs in the morning and at bedtime. Pearlean Manus, MD  Active   cefdinir  (OMNICEF ) 300 MG capsule [id number] Yes Take 1 capsule (300 mg total) by mouth 2 (two) times daily for 5 days. Pearlean Manus, MD  Active   fluticasone  (FLONASE ) 50 MCG/ACT nasal spray [id number] Yes Place 2 sprays into both nostrils daily. [place] Thompson, Nena, NP  Active Self, Pharmacy Records  guaiFENesin  (MUCINEX ) 600 MG 12 hr tablet [id number] Yes Take 1 tablet (600 mg total) by mouth 2 (two) times daily. Pearlean Manus, MD  Active   isosorbide  mononitrate (IMDUR ) 30 MG 24 hr tablet [id number] Yes Take 1 tablet (30 mg total) by mouth daily. Pearlean Manus, MD  Active   levothyroxine  (SYNTHROID ) 50 MCG tablet [id number] Yes Take 1 tablet (50 mcg total) by mouth daily before breakfast. Pearlean, Courage, MD  Active   nicotine  (NICODERM CQ  - DOSED IN MG/24 HOURS) 21 mg/24hr patch [id number] Yes Place 1 patch (21 mg total) onto the skin daily. Pearlean Manus, MD  Active   omeprazole  (PRILOSEC) 20 MG capsule [id number] Yes Take 1 capsule (20 mg total) by mouth daily. Pearlean Manus, MD  Active   predniSONE  (DELTASONE ) 20 MG tablet [id number] Yes Take 2 tablets (40 mg total) by mouth daily with breakfast for 5 days. Pearlean Manus, MD  Active   traZODone  (DESYREL ) 50 MG tablet [id number] Yes Take 1 tablet (50 mg total) by mouth at bedtime. Pearlean Manus, MD  Active   valsartan  (DIOVAN ) 160 MG tablet [id number] Yes Take 1 tablet (160 mg  total) by mouth daily. Pearlean Manus, MD  Active             Home Care and Equipment/Supplies: Were Home Health Services Ordered?: No Any new equipment or medical supplies ordered?: Yes Name of Medical supply agency?: [facility] Were you able to get the  equipment/medical supplies?: Yes Do you have any questions related to the use of the equipment/supplies?: No  Functional Questionnaire: Do you need assistance with bathing/showering or dressing?: No Do you need assistance with meal preparation?: No Do you need assistance with eating?: No Do you have difficulty maintaining continence: No Do you need assistance with getting out of bed/getting out of a chair/moving?: No Do you have difficulty managing or taking your medications?: No  Follow up appointments reviewed: PCP Follow-up appointment confirmed?: Yes Date of PCP follow-up appointment?: 02/10/24 Follow-up Provider: Nena Hummer  @ 1015 am Specialist Hospital Follow-up appointment confirmed?: Yes Date of Specialist follow-up appointment?: 03/24/24 Follow-Up Specialty Provider:: pulmonary Do you need transportation to your follow-up appointment?: No Do you understand care options if your condition(s) worsen?: Yes-patient verbalized understanding  SDOH Interventions Today    Flowsheet Row Most Recent Value  SDOH Interventions   Food Insecurity Interventions Intervention Not Indicated  Housing Interventions Intervention Not Indicated  Transportation Interventions Intervention Not Indicated  Utilities Interventions Intervention Not Indicated    Goals Addressed             This Visit's Progress    [facility]I Transitions of Care (TOC) Care Plan       Problems:  Recent Hospitalization for treatment of COPD exacerbation No Hospital Follow Up Provider appointment - pt requests RN CM schedule appointment Pt has pulse oximeter, reading now on room air is 95%, pt states oxygen is set on 2 liters and wears it most of the time  Goal:  Over the next 30 days, the patient will not experience hospital readmission  Interventions:  COPD Interventions: Advised patient to track and manage COPD triggers Advised patient to self assesses COPD action plan zone and make appointment with  provider if in the yellow zone for 48 hours without improvement Assessed social determinant of health barriers Discussed the importance of adequate rest and management of fatigue with COPD Provided education about and advised patient to utilize infection prevention strategies to reduce risk of respiratory infection Provided instruction about proper use of medications used for management of COPD including inhalers Screening for signs and symptoms of depression related to chronic disease state  Use of home oxygen Collaborated with care guide and scheduled post hospital follow up appointment for 02/10/24 @ 1015 with Nena Hummer NP Reviewed importance of wearing nicotine  patches towards goal of smoking cessation  Patient Self Care Activities:  Attend all scheduled provider appointments Attend church or other social activities Call pharmacy for medication refills 3-7 days in advance of running out of medications Call provider office for new concerns or questions  Notify RN Care Manager of TOC call rescheduling needs Participate in Transition of Care Program/Attend TOC scheduled calls Perform all self care activities independently  Perform IADL's (shopping, preparing meals, housekeeping, managing finances) independently Take medications as prescribed   eliminate smoking in my home identify and remove indoor air pollutants listen for public air quality announcements every day do breathing exercises every day develop a rescue plan eliminate symptom triggers at home keep follow-up appointments: primary care provider 02/10/24 @ 1015 am, pulmonary 03/24/24 get at least 7 to 8 hours of sleep at  night practice relaxation or meditation daily  Plan:  Telephone follow up appointment with care management team member scheduled for:  02/09/24 @ 215 pm The patient has been provided with contact information for the care management team and has been advised to call with any health related questions or  concerns.         Mliss Creed [facility], BSN RN Care Manager/ Transition of Care [place]/ [facility] [phone]

## 2024-02-03 NOTE — Progress Notes (Signed)
 Ambulatory Pulse Oximetry   Row Name 01/28/24 1312      Resting  Resting Heart Rate 85      Resting Sp02 95      Lap 1 (250 feet)  HR 101      02 Sat 91      Lap 2 (250 feet)  HR 83      02 Sat 90      Lap 3 (250 feet)  HR 102      02 Sat 71      Tech Comments: pt droped to a 71 and was put on 2L of O2 and tolerated 2L

## 2024-02-03 NOTE — Addendum Note (Signed)
 Addended byBETHA FRIES, Brienna Bass A on: 02/03/2024 03:22 PM   Modules accepted: Orders

## 2024-02-03 NOTE — Telephone Encounter (Signed)
Order placed in previous encounter.

## 2024-02-03 NOTE — Telephone Encounter (Signed)
 We need to clarify that the patient qualified, that he wants the O2, and then get him a POC ordered. Thanks.

## 2024-02-03 NOTE — Telephone Encounter (Signed)
 Re-placing order for [name] [name] to sign, RB is out.

## 2024-02-03 NOTE — Telephone Encounter (Signed)
 Please order him a POC based on his walking titration.

## 2024-02-03 NOTE — Telephone Encounter (Signed)
 I have checked the patients chart.  Montia CMA, who was working with RB during the patients ov 8/7 verified that the patient qualified for 2L POC and continuous as she is the one who walked the patient and charted information.  No order was placed.  I am placing order now.

## 2024-02-04 ENCOUNTER — Telehealth: Payer: Self-pay

## 2024-02-04 NOTE — Telephone Encounter (Signed)
 I called and spoke with the pt and advised him the we placed order today and it was signed and sent to DME  He states he already received a call about it being delivered  Nothing further needed

## 2024-02-04 NOTE — Telephone Encounter (Signed)
 Copied from CRM [id number]. Topic: General - Other >> Feb 01, 2024  8:11 AM Antonio H wrote: Reason for CRM: Patient is calling because he recently received his oxygen tank but he is needing an order for a POC (portable oxygen concentrator). Order needs to be sent to [facility]. Patient be reached at [phone]. >> Feb 04, 2024  9:46 AM Leila BROCKS wrote: Patient [phone] states has been trying to have Dr. Shelah to send POC (portable oxygen concentrator) and it was sent to a different company than what patient is using. Patient got a phone call from the other company today. Patient is using [facility] on [address], [place] [phone] and fax# [phone]. Please call back.  >> Feb 02, 2024 11:07 AM Corean SAUNDERS wrote: Patient is requesting an update on his order for a POC.

## 2024-02-05 ENCOUNTER — Telehealth: Payer: Self-pay

## 2024-02-05 DIAGNOSIS — J441 Chronic obstructive pulmonary disease with (acute) exacerbation: Secondary | ICD-10-CM | POA: Diagnosis not present

## 2024-02-05 NOTE — Telephone Encounter (Signed)
 Lauraine not in office til Monday to sign will fax on monday

## 2024-02-05 NOTE — Telephone Encounter (Signed)
 Copied from CRM #[id number]. Topic: General - Other >> Feb 05, 2024 10:00 AM Whitney O wrote: Reason for CRM: [facility] /. we received a order for a oxygen set up the original dme company refused a portable oxygen unit so we are going to be delivering a poc and concentrator today . yesterday i faxed a cmn  needed sign by groce with the liters flowing condition i put the information in there for her all she need to do is verfiy and signed it and fax it back and we just updated what she already had on the order We just wanted to see if we can get that fax back today  Resending to alternate fax number now  [phone]  [phone] fax number

## 2024-02-09 ENCOUNTER — Other Ambulatory Visit: Payer: Self-pay | Admitting: *Deleted

## 2024-02-09 NOTE — Patient Outreach (Signed)
  Transition of Care week 2  Visit Note  02/09/2024  Name: [name] [name] MRN: [id number]          DOB: [birth year]  Situation: Patient enrolled in [facility] 30-day program. Visit completed with patient by telephone.   Background:    Past Medical History:  Diagnosis Date   Arthritis    Carotid artery stenosis without cerebral infarction    vascular --- dr eliza;   09-24-2007  s/p right CEA   Chronic cholecystitis    w/  large calcified stone in gallbladder   Chronic constipation    COPD (chronic obstructive pulmonary disease) (HCC)    followed by pcp----  (05-28-2021  pt stated last used rescue 05-27-2021 for wheezing resolved after use, stated exacerbation approx  greater than a year ago)   Coronary artery disease 2002   cardiologist-- dr lavona ;  (05-28-2021  pt denies need to use nitro for CP) per last cath 08-04-2017  LM luminal irregularities, LAD 30% ,D1 ostial 35% ,CFx   AV groove ostial 25% , OM1 long prox 30%,  proxRCA occluded and dominant,   excellent left to right collateral filling.   Depression    Patient denies   DOE (dyspnea on exertion)    due to smoking/ copd   Dyslipidemia    Essential hypertension    Full dentures    GERD (gastroesophageal reflux disease)    History of adenomatous polyp of colon    History of COVID-19    per pt winter 2022, mild to moderate symptoms that resolved   History of diverticulitis of colon    History of kidney stones    Hyperlipidemia    Hypothyroidism    followed by pcp   Lung nodule    s/p bronchoscopy w/ bx in 2005,  benign   Osteoporosis    Smokers' cough (HCC)    05-28-2021  pt stated occasionally productive w/ white phlegm   Wears glasses     Assessment:   Unable to complete remainder of assessments due to pt ask RN CM to call back, something wrong with patient's phone, when called back, no one answered.  Will attempt to reach pt tomorrow 02/10/24. Patient Reported Symptoms: Cognitive Cognitive Status: No symptoms  reported, Able to follow simple commands, Alert and oriented to person, place, and time, Normal speech and language skills      Neurological Neurological Review of Symptoms: No symptoms reported    HEENT HEENT Symptoms Reported: No symptoms reported      Cardiovascular Cardiovascular Symptoms Reported: No symptoms reported    Respiratory      Endocrine      Gastrointestinal        Genitourinary      Integumentary      Musculoskeletal          Psychosocial           There were no vitals filed for this visit.  Medications Reviewed Today   Medications were not reviewed in this encounter     Recommendation:   PCP Follow-up  Follow Up Plan:   Telephone follow-up 02/10/24  Mliss Creed [facility], BSN RN Care Manager/ Transition of Care [place]/ [facility] [phone]

## 2024-02-09 NOTE — Progress Notes (Unsigned)
   Established Patient Office Visit  Subjective  Patient ID: [name] [name], male    DOB: [birth year]  Age: 73 y.o. MRN: [id number]  No chief complaint on file.   HPI  {History (Optional):23778}  ROS Negative unless indicated in HPI   Objective:     There were no vitals taken for this visit. {Vitals History (Optional):23777}  Physical Exam   No results found for any visits on 02/10/24.  {Labs (Optional):23779}    Assessment & Plan:  There are no diagnoses linked to this encounter.  No follow-ups on file.    @Kaylana Fenstermacher  [place], [place]    Note: This document was prepared by [employer] voice dictation technology and any errors that results from this process are unintentional.

## 2024-02-09 NOTE — Patient Instructions (Addendum)
 Visit Information  Thank you for taking time to visit with me today. Please don't hesitate to contact me if I can be of assistance to you before our next scheduled telephone appointment.  Following is a copy of your care plan:   Goals Addressed             This Visit's Progress    VBCI Transitions of Care (TOC) Care Plan       Problems:  Recent Hospitalization for treatment of COPD exacerbation No Hospital Follow Up Provider appointment - pt requests RN CM schedule appointment Pt has pulse oximeter, reading now on room air is 95%, pt states oxygen is set on 2 liters and wears it most of the time 02/09/24- spoke with pt who reports there is a bad connection with the phone, sounds like a fax machine, was able to complete part of today's call, pt ask for RN CM to hang up and call right back,  RN CM called back and no answer to phone.  Goal:  Over the next 30 days, the patient will not experience hospital readmission  Interventions:  COPD Interventions: Advised patient to track and manage COPD triggers Advised patient to self assesses COPD action plan zone and make appointment with provider if in the yellow zone for 48 hours without improvement Assessed social determinant of health barriers Discussed the importance of adequate rest and management of fatigue with COPD Provided education about and advised patient to utilize infection prevention strategies to reduce risk of respiratory infection Provided instruction about proper use of medications used for management of COPD including inhalers Screening for signs and symptoms of depression related to chronic disease state  Use of home oxygen Reviewed importance of wearing nicotine  patches towards goal of smoking cessation  Patient Self Care Activities:  Attend all scheduled provider appointments Attend church or other social activities Call pharmacy for medication refills 3-7 days in advance of running out of medications Call provider  office for new concerns or questions  Notify RN Care Manager of TOC call rescheduling needs Participate in Transition of Care Program/Attend TOC scheduled calls Perform all self care activities independently  Perform IADL's (shopping, preparing meals, housekeeping, managing finances) independently Take medications as prescribed   eliminate smoking in my home identify and remove indoor air pollutants listen for public air quality announcements every day do breathing exercises every day develop a rescue plan eliminate symptom triggers at home keep follow-up appointments: primary care provider 02/10/24 @ 1015 am, pulmonary 03/24/24 get at least 7 to 8 hours of sleep at night practice relaxation or meditation daily  Plan: Will outreach pt again tomorrow 8/20 to try and complete remainder of today's visit and schedule appointment for next week.        Patient verbalizes understanding of instructions and care plan provided today and agrees to view in MyChart. Active MyChart status and patient understanding of how to access instructions and care plan via MyChart confirmed with patient.     Telephone follow up appointment with care management team member scheduled for: will attempt outreach 02/10/24  Please call the care guide team at [phone] if you need to cancel or reschedule your appointment.   Please call the Suicide and Crisis Lifeline: 988 call the [place]  National Suicide Prevention Lifeline: 401-072-3358 or TTY: 830-695-7304 TTY 203-030-2056) to talk to a trained counselor call 1-800-273-TALK (toll free, 24 hour hotline) go to [facility] [address] [place] [phone]) call 911 if you are  experiencing a Mental Health or Behavioral Health Crisis or need someone to talk to.  Mliss Creed [facility], BSN RN Care Manager/ Transition of Care [place]/ [facility] [phone]

## 2024-02-10 ENCOUNTER — Ambulatory Visit (INDEPENDENT_AMBULATORY_CARE_PROVIDER_SITE_OTHER): Admitting: Nurse Practitioner

## 2024-02-10 ENCOUNTER — Telehealth: Payer: Self-pay | Admitting: *Deleted

## 2024-02-10 ENCOUNTER — Encounter: Payer: Self-pay | Admitting: Nurse Practitioner

## 2024-02-10 VITALS — BP 126/71 | HR 78 | Temp 97.8°F | Ht 70.0 in | Wt 136.8 lb

## 2024-02-10 DIAGNOSIS — Z09 Encounter for follow-up examination after completed treatment for conditions other than malignant neoplasm: Secondary | ICD-10-CM | POA: Diagnosis not present

## 2024-02-10 DIAGNOSIS — J441 Chronic obstructive pulmonary disease with (acute) exacerbation: Secondary | ICD-10-CM | POA: Insufficient documentation

## 2024-02-10 DIAGNOSIS — J9601 Acute respiratory failure with hypoxia: Secondary | ICD-10-CM | POA: Diagnosis not present

## 2024-02-10 NOTE — Patient Outreach (Signed)
 Transition of Care week 3  Visit Note  02/10/2024  Name: [name] [name] MRN: [id number]          DOB: 21-Jul-1950  Situation: Patient enrolled in [facility] 30-day program. Visit completed with patient by telephone.   Background:     Past Medical History:  Diagnosis Date   Arthritis    Carotid artery stenosis without cerebral infarction    vascular --- dr eliza;   09-24-2007  s/p right CEA   Chronic cholecystitis    w/  large calcified stone in gallbladder   Chronic constipation    COPD (chronic obstructive pulmonary disease) (HCC)    followed by pcp----  (05-28-2021  pt stated last used rescue 05-27-2021 for wheezing resolved after use, stated exacerbation approx  greater than a year ago)   Coronary artery disease 2002   cardiologist-- dr lavona ;  (05-28-2021  pt denies need to use nitro for CP) per last cath 08-04-2017  LM luminal irregularities, LAD 30% ,D1 ostial 35% ,CFx   AV groove ostial 25% , OM1 long prox 30%,  proxRCA occluded and dominant,   excellent left to right collateral filling.   Depression    Patient denies   DOE (dyspnea on exertion)    due to smoking/ copd   Dyslipidemia    Essential hypertension    Full dentures    GERD (gastroesophageal reflux disease)    History of adenomatous polyp of colon    History of COVID-19    per pt winter 2022, mild to moderate symptoms that resolved   History of diverticulitis of colon    History of kidney stones    Hyperlipidemia    Hypothyroidism    followed by pcp   Lung nodule    s/p bronchoscopy w/ bx in 2005,  benign   Osteoporosis    Smokers' cough (HCC)    05-28-2021  pt stated occasionally productive w/ white phlegm   Wears glasses     Assessment: Patient Reported Symptoms: Cognitive Cognitive Status: No symptoms reported, Able to follow simple commands, Alert and oriented to person, place, and time, Normal speech and language skills      Neurological Neurological Review of Symptoms: No symptoms  reported    HEENT HEENT Symptoms Reported: No symptoms reported      Cardiovascular Cardiovascular Symptoms Reported: No symptoms reported    Respiratory Respiratory Symptoms Reported: Shortness of breath Other Respiratory Symptoms: dyspnea with exertion Additional Respiratory Details: using oxygen at 2 liters via [place]  (uses most of the time- per pt) Respiratory Self-Management Outcome: 4 (good)  Endocrine Endocrine Symptoms Reported: No symptoms reported Is patient diabetic?: No    Gastrointestinal Gastrointestinal Symptoms Reported: No symptoms reported      Genitourinary Genitourinary Symptoms Reported: No symptoms reported    Integumentary Integumentary Symptoms Reported: No symptoms reported    Musculoskeletal Musculoskelatal Symptoms Reviewed: No symptoms reported        Psychosocial Psychosocial Symptoms Reported: Anxiety - if selected complete GAD Additional Psychological Details: pt states he recently started having anxiety attacks and feeling closed in like claustrophobia Behavioral Health Comment: pt saw NP at primary care provider office today, she prefers he see his regular primary care provider, has appointment 02/11/24       There were no vitals filed for this visit.  Medications Reviewed Today     Reviewed by Aura Mliss LABOR, RN (Registered Nurse) on 02/10/24 at 1518  Med List Status: <None>   Medication Order Taking? Sig Documenting  Provider Last Dose Status Informant  albuterol  (PROVENTIL ) (2.5 MG/3ML) 0.083% nebulizer solution [id number]  Take 3 mLs (2.5 mg total) by nebulization every 4 (four) hours as needed for wheezing or shortness of breath. Pearlean Manus, MD  Active   aspirin  EC 81 MG tablet [id number]  Take 1 tablet (81 mg total) by mouth daily with breakfast. Swallow whole. Pearlean Manus, MD  Active   Bempedoic Acid -Ezetimibe  (NEXLIZET ) 180-10 MG TABS [id number]  TAKE ONE TABLET DAILY Dettinger, [name], MD  Active Self, Pharmacy Records   budesonide -glycopyrrolate -formoterol  (BREZTRI  AEROSPHERE) 160-9-4.8 MCG/ACT AERO inhaler [id number]  Inhale 2 puffs into the lungs in the morning and at bedtime. Pearlean Manus, MD  Active   fluticasone  (FLONASE ) 50 MCG/ACT nasal spray [id number]  Place 2 sprays into both nostrils daily. [place] Thompson, Nena, NP  Active Self, Pharmacy Records  guaiFENesin  (MUCINEX ) 600 MG 12 hr tablet [id number]  Take 1 tablet (600 mg total) by mouth 2 (two) times daily. Pearlean Manus, MD  Active   isosorbide  mononitrate (IMDUR ) 30 MG 24 hr tablet [id number]  Take 1 tablet (30 mg total) by mouth daily. Pearlean Manus, MD  Active   levothyroxine  (SYNTHROID ) 50 MCG tablet [id number]  Take 1 tablet (50 mcg total) by mouth daily before breakfast. Pearlean, Courage, MD  Active   nicotine  (NICODERM CQ  - DOSED IN MG/24 HOURS) 21 mg/24hr patch [id number]  Place 1 patch (21 mg total) onto the skin daily. Pearlean Manus, MD  Active   omeprazole  (PRILOSEC) 20 MG capsule [id number]  Take 1 capsule (20 mg total) by mouth daily. Pearlean Manus, MD  Active   traZODone  (DESYREL ) 50 MG tablet [id number]  Take 1 tablet (50 mg total) by mouth at bedtime. Pearlean Manus, MD  Active   valsartan  (DIOVAN ) 160 MG tablet [id number]  Take 1 tablet (160 mg total) by mouth daily. Pearlean Manus, MD  Active             Recommendation:   PCP Follow-up Practice relaxation daily Keep stress to a minimum  Follow Up Plan:   Telephone follow-up 02/17/24 @ 11 am  Mliss Creed [facility], BSN RN Care Manager/ Transition of Care [place]/ [facility] [phone]

## 2024-02-10 NOTE — Patient Instructions (Signed)
 Visit Information  Thank you for taking time to visit with me today. Please don't hesitate to contact me if I can be of assistance to you before our next scheduled telephone appointment.  Following are the goals we discussed today:   Goals Addressed             This Visit's Progress    VBCI Transitions of Care (TOC) Care Plan       Problems:  Recent Hospitalization for treatment of COPD exacerbation No Hospital Follow Up Provider appointment - pt requests RN CM schedule appointment Pt has pulse oximeter, reading now on room air is 95%, pt states oxygen is set on 2 liters and wears it most of the time 02/09/24- spoke with pt who reports there is a bad connection with the phone, sounds like a fax machine, was able to complete part of today's call, pt ask for RN CM to hang up and call right back,  RN CM called back and no answer to phone. 02/10/24- spoke with pt who reports he saw NP at primary care provider office today 8/20 and reported having some anxiety episodes that do not happen everyday, few times per week, he now has appointment with his primary care provider on 02/11/24 to discuss further, no other concerns reported  Goal:  Over the next 30 days, the patient will not experience hospital readmission  Interventions:  COPD Interventions: Advised patient to track and manage COPD triggers Advised patient to self assesses COPD action plan zone and make appointment with provider if in the yellow zone for 48 hours without improvement Discussed the importance of adequate rest and management of fatigue with COPD Provided education about and advised patient to utilize infection prevention strategies to reduce risk of respiratory infection Provided instruction about proper use of medications used for management of COPD including inhalers Use of home oxygen Reviewed importance of wearing nicotine  patches towards goal of smoking cessation Reviewed relaxation techniques if feeling  anxious  Patient Self Care Activities:  Attend all scheduled provider appointments Attend church or other social activities Call pharmacy for medication refills 3-7 days in advance of running out of medications Call provider office for new concerns or questions  Notify RN Care Manager of TOC call rescheduling needs Participate in Transition of Care Program/Attend TOC scheduled calls Perform all self care activities independently  Perform IADL's (shopping, preparing meals, housekeeping, managing finances) independently Take medications as prescribed   eliminate smoking in my home identify and remove indoor air pollutants listen for public air quality announcements every day do breathing exercises every day develop a rescue plan eliminate symptom triggers at home keep follow-up appointments: primary care provider 02/11/24 , pulmonary 03/24/24 get at least 7 to 8 hours of sleep at night practice relaxation or meditation daily Practice relaxation daily and keep stress to a minimum  Plan: Telephone outreach - 02/17/24 @ 11 am         Our next appointment is by telephone on 02/18/24 at 11 am  Please call the care guide team at [phone] if you need to cancel or reschedule your appointment.   If you are experiencing a Mental Health or Behavioral Health Crisis or need someone to talk to, please call the Suicide and Crisis Lifeline: 988 call the [place]  National Suicide Prevention Lifeline: 6465712138 or TTY: 702-334-8232 TTY (914)604-1440) to talk to a trained counselor call 1-800-273-TALK (toll free, 24 hour hotline) go to [facility] [address], [place] [phone]) call  the [facility]: [phone] call 911   Patient verbalizes understanding of instructions and care plan provided today and agrees to view in MyChart. Active MyChart status and patient understanding of how to access instructions and care plan via MyChart  confirmed with patient.     Telephone follow up appointment with care management team member scheduled for:  Mliss Creed [facility], BSN RN Care Manager/ Transition of Care [place]/ [facility] [phone]

## 2024-02-11 ENCOUNTER — Ambulatory Visit (INDEPENDENT_AMBULATORY_CARE_PROVIDER_SITE_OTHER): Admitting: Family Medicine

## 2024-02-11 ENCOUNTER — Encounter: Payer: Self-pay | Admitting: Family Medicine

## 2024-02-11 VITALS — BP 125/59 | HR 80 | Temp 97.5°F | Ht 70.0 in | Wt 136.0 lb

## 2024-02-11 DIAGNOSIS — J441 Chronic obstructive pulmonary disease with (acute) exacerbation: Secondary | ICD-10-CM | POA: Diagnosis not present

## 2024-02-11 DIAGNOSIS — F419 Anxiety disorder, unspecified: Secondary | ICD-10-CM

## 2024-02-11 MED ORDER — SERTRALINE HCL 50 MG PO TABS: 50.0000 mg | ORAL_TABLET | Freq: Every day | ORAL | 2 refills | Status: DC

## 2024-02-11 MED ORDER — HYDROXYZINE PAMOATE 25 MG PO CAPS: 25.0000 mg | ORAL_CAPSULE | Freq: Three times a day (TID) | ORAL | 2 refills | Status: DC | PRN

## 2024-02-11 MED ORDER — TRAZODONE HCL 100 MG PO TABS: 100.0000 mg | ORAL_TABLET | Freq: Every day | ORAL | 1 refills | Status: AC

## 2024-02-11 MED ORDER — NICOTINE 14 MG/24HR TD PT24: 14.0000 mg | MEDICATED_PATCH | Freq: Every day | TRANSDERMAL | 1 refills | Status: DC

## 2024-02-11 NOTE — Progress Notes (Signed)
 BP (!) 125/59   Pulse 80   Temp (!) 97.5 F (36.4 C)   Ht 5' 10 (1.778 m)   Wt 136 lb (61.7 kg)   SpO2 94%   BMI 19.51 kg/m    Subjective:   Patient ID: [name] [name], male    DOB: 06/12/1951, 73 y.o.   MRN: [id number]  HPI: [name] [name] is a 73 y.o. male presenting on 02/11/2024 for Medical Management of Chronic Issues, Hypothyroidism, Hyperlipidemia, and COPD   Discussed the use of AI scribe software for clinical note transcription with the patient, who gave verbal consent to proceed.  History of Present Illness   [name] [name] is a 73 year old male with COPD who presents with recent hospitalization for lung disease exacerbation.  He was recently hospitalized due to a lung disease exacerbation and was started on 2 liters of oxygen. His pulse oximetry readings are around 94-95% with oxygen, dropping to 89% if the oxygen is removed for a while. He uses Breztri  inhalers and albuterol  nebulizers, which help with breathing but cause jitteriness and anxiety.  He experiences anxiety attacks, which he associates with the albuterol  use. He also reports difficulty sleeping, waking up around 12:30 AM despite using trazodone , which was previously effective when combined with a 'nerve pill' during his hospital stay.  He has recently quit smoking, marking seven days without cigarettes, and is using a nicotine  patch. He has been on the 21 mg patch for two weeks and feels ready to step down to a lower dose.  He has been engaging in physical activity, walking up and down his driveway twice daily, and notes that stairs are particularly challenging.          Relevant past medical, surgical, family and social history reviewed and updated as indicated. Interim medical history since our last visit reviewed. Allergies and medications reviewed and updated.  Review of Systems  Constitutional:  Negative for chills and fever.  HENT:  Positive for congestion.   Eyes:  Negative for  visual disturbance.  Respiratory:  Positive for cough and shortness of breath. Negative for wheezing.   Cardiovascular:  Negative for chest pain and leg swelling.  Musculoskeletal:  Negative for back pain and gait problem.  Skin:  Negative for rash.  Neurological:  Negative for dizziness and light-headedness.  Psychiatric/Behavioral:  Positive for dysphoric mood. The patient is nervous/anxious.   All other systems reviewed and are negative.   Per HPI unless specifically indicated above   Allergies as of 02/11/2024       Reactions   Flomax  [tamsulosin  Hcl] Other (See Comments)   Made patient hypotensive, clammy, sweaty, and feel like he was going to pass out (EMS had to be called)   Crestor [rosuvastatin Calcium ] Other (See Comments)   Lethargy and muscle aches   Lipitor [atorvastatin Calcium ] Other (See Comments)   Body cramps, lethargy, and muscle aches        Medication List        Accurate as of February 11, 2024  1:30 PM. If you have any questions, ask your nurse or doctor.          STOP taking these medications    nicotine  21 mg/24hr patch Commonly known as: NICODERM CQ  - dosed in mg/24 hours Replaced by: nicotine  14 mg/24hr patch Stopped by: [name] Aneira Cavitt       TAKE these medications    albuterol  (2.5 MG/3ML) 0.083% nebulizer solution Commonly known as: PROVENTIL  Take 3  mLs (2.5 mg total) by nebulization every 4 (four) hours as needed for wheezing or shortness of breath.   aspirin  EC 81 MG tablet Take 1 tablet (81 mg total) by mouth daily with breakfast. Swallow whole.   Breztri  Aerosphere 160-9-4.8 MCG/ACT Aero inhaler Generic drug: budesonide -glycopyrrolate -formoterol  Inhale 2 puffs into the lungs in the morning and at bedtime.   fluticasone  50 MCG/ACT nasal spray Commonly known as: FLONASE  Place 2 sprays into both nostrils daily.   guaiFENesin  600 MG 12 hr tablet Commonly known as: Mucinex  Take 1 tablet (600 mg total) by mouth 2 (two) times  daily.   hydrOXYzine  25 MG capsule Commonly known as: VISTARIL  Take 1 capsule (25 mg total) by mouth every 8 (eight) hours as needed. Started by: [name] Marlisa Caridi   isosorbide  mononitrate 30 MG 24 hr tablet Commonly known as: IMDUR  Take 1 tablet (30 mg total) by mouth daily.   levothyroxine  50 MCG tablet Commonly known as: SYNTHROID  Take 1 tablet (50 mcg total) by mouth daily before breakfast.   Nexlizet  180-10 MG Tabs Generic drug: Bempedoic Acid -Ezetimibe  TAKE ONE TABLET DAILY   nicotine  14 mg/24hr patch Commonly known as: Nicoderm CQ  Place 1 patch (14 mg total) onto the skin daily. Replaces: nicotine  21 mg/24hr patch Started by: [name] Chigozie Basaldua   omeprazole  20 MG capsule Commonly known as: PRILOSEC Take 1 capsule (20 mg total) by mouth daily.   OXYGEN Inhale 2 L into the lungs daily.   sertraline  50 MG tablet Commonly known as: Zoloft  Take 1 tablet (50 mg total) by mouth daily. Started by: [name] Keslie Gritz   traZODone  100 MG tablet Commonly known as: DESYREL  Take 1 tablet (100 mg total) by mouth at bedtime. What changed:  medication strength how much to take Changed by: [name] Sujata Maines   valsartan  160 MG tablet Commonly known as: DIOVAN  Take 1 tablet (160 mg total) by mouth daily.         Objective:   BP (!) 125/59   Pulse 80   Temp (!) 97.5 F (36.4 C)   Ht 5' 10 (1.778 m)   Wt 136 lb (61.7 kg)   SpO2 94%   BMI 19.51 kg/m   Wt Readings from Last 3 Encounters:  02/11/24 136 lb (61.7 kg)  02/10/24 136 lb 12.8 oz (62.1 kg)  01/29/24 138 lb 10.7 oz (62.9 kg)    Physical Exam Physical Exam   NECK: Thyroid  normal. CHEST: No wheezing. CARDIOVASCULAR: Regular rate and rhythm, small murmur.         Assessment & Plan:   Problem List Items Addressed This Visit       Respiratory   COPD exacerbation (HCC) - Primary   Relevant Medications   nicotine  (NICODERM CQ ) 14 mg/24hr patch   Other Visit Diagnoses       Anxiety disorder  with panic attacks       Relevant Medications   sertraline  (ZOLOFT ) 50 MG tablet   hydrOXYzine  (VISTARIL ) 25 MG capsule   traZODone  (DESYREL ) 100 MG tablet           COPD with recent exacerbation Recent exacerbation required hospitalization and supplemental oxygen. Symptoms improved with treatment. Discussed incomplete recovery to baseline lung function and long-term impact. Emphasized physical activity and smoking cessation. - Continue supplemental oxygen at 2 L/min. - Continue Breztri  inhaler and albuterol  nebulizer. - Encourage regular physical activity. - Follow up with pulmonologist on October 6.  Nicotine  dependence, current, on nicotine  replacement therapy Currently on 21 mg nicotine   patch, ready to step down. Discussed nicotine 's potential contribution to anxiety. Decision to decrease patch to 14 mg. - Decrease nicotine  patch to 14 mg.  Generalized anxiety disorder Increased anxiety possibly due to albuterol  and nicotine , affecting sleep. Discussed Atarax  for acute relief and Zoloft  for long-term management. Decision to start Zoloft . - Prescribe Atarax  as needed for acute anxiety. - Start Zoloft  daily in the morning.  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 Trazodone  not effective. - Increase trazodone  to 100 mg at bedtime. - Use Atarax  in the evening as needed.          Follow up plan: Return if symptoms worsen or fail to improve, for 1 to 15-month anxiety recheck.  Counseling provided for all of the vaccine components No orders of the defined types were placed in this encounter.   Fonda Levins, MD [facility] 02/11/2024, 1:30 PM

## 2024-02-17 ENCOUNTER — Other Ambulatory Visit: Payer: Self-pay | Admitting: *Deleted

## 2024-02-17 NOTE — Patient Instructions (Signed)
 Visit Information  Thank you for taking time to visit with me today. Please don't hesitate to contact me if I can be of assistance to you before our next scheduled telephone appointment.  Our next appointment is by telephone on 02/24/24 @ 930 am  Following is a copy of your care plan:   Goals Addressed             This Visit's Progress    VBCI Transitions of Care (TOC) Care Plan       Problems:  Recent Hospitalization for treatment of COPD exacerbation No Hospital Follow Up Provider appointment - pt requests RN CM schedule appointment Pt has pulse oximeter, reading now on room air is 95%, pt states oxygen is set on 2 liters and wears it most of the time 02/09/24- spoke with pt who reports there is a bad connection with the phone, sounds like a fax machine, was able to complete part of today's call, pt ask for RN CM to hang up and call right back,  RN CM called back and no answer to phone. 02/10/24- spoke with pt who reports he saw NP at primary care provider office today 8/20 and reported having some anxiety episodes that do not happen everyday, few times per week, he now has appointment with his primary care provider on 02/11/24 to discuss further, no other concerns reported 02/17/24- pt reports he saw primary care provider 8/21- prescribed medication for anxiety, taking hydroxyzine  prn, choosing not to take sertraline , is not wearing nicotine  patch, does not feel like he needs now, has not smoked in 2 weeks, pt is trying to get outdoors and walk daily, denies any anxiety today  Goal:  Over the next 30 days, the patient will not experience hospital readmission  Interventions:  COPD Interventions: Advised patient to track and manage COPD triggers Advised patient to self assesses COPD action plan zone and make appointment with provider if in the yellow zone for 48 hours without improvement Discussed the importance of adequate rest and management of fatigue with COPD Provided education  about and advised patient to utilize infection prevention strategies to reduce risk of respiratory infection Provided instruction about proper use of medications used for management of COPD including inhalers Use of home oxygen Encouraged and congratulated pt on smoking cessation Reinforced relaxation techniques if feeling anxious Reviewed medication changes, pt is choosing not to take sertraline  Reviewed importance of light exercise  Patient Self Care Activities:  Attend all scheduled provider appointments Attend church or other social activities Call pharmacy for medication refills 3-7 days in advance of running out of medications Call provider office for new concerns or questions  Notify RN Care Manager of TOC call rescheduling needs Participate in Transition of Care Program/Attend TOC scheduled calls Perform all self care activities independently  Perform IADL's (shopping, preparing meals, housekeeping, managing finances) independently Take medications as prescribed   eliminate smoking in my home identify and remove indoor air pollutants listen for public air quality announcements every day do breathing exercises every day develop a rescue plan eliminate symptom triggers at home keep follow-up appointments: primary care provider 02/11/24 , pulmonary 03/24/24 get at least 7 to 8 hours of sleep at night practice relaxation or meditation daily Practice relaxation daily and keep stress to a minimum Keep up the good work with smoking cessation!  Plan: Telephone outreach 02/24/24 @ 930 am        Patient verbalizes understanding of instructions and care plan provided today and agrees to view  in MyChart. Active MyChart status and patient understanding of how to access instructions and care plan via MyChart confirmed with patient.     Telephone follow up appointment with care management team member scheduled for:  02/24/24 @ 930 am  Please call the care guide team at [phone] if you  need to cancel or reschedule your appointment.   Please call the Suicide and Crisis Lifeline: 988 call the [place]  National Suicide Prevention Lifeline: 480-730-9687 or TTY: 782-665-0782 TTY 220-128-1748) to talk to a trained counselor call 1-800-273-TALK (toll free, 24 hour hotline) go to [facility] [address], [place] [phone]) call the [facility] Crisis Line: [phone] call 911 if you are experiencing a Mental Health or Behavioral Health Crisis or need someone to talk to.  Mliss Creed [facility], BSN RN Care Manager/ Transition of Care [place]/ [facility] [phone]

## 2024-02-17 NOTE — Patient Outreach (Signed)
 Transition of Care week 3  Visit Note  02/17/2024  Name: [name] [name] MRN: [id number]          DOB: 11-15-1950  Situation: Patient enrolled in [facility] 30-day program. Visit completed with patient by telephone.   Background:     Past Medical History:  Diagnosis Date   Arthritis    Carotid artery stenosis without cerebral infarction    vascular --- dr eliza;   09-24-2007  s/p right CEA   Chronic cholecystitis    w/  large calcified stone in gallbladder   Chronic constipation    COPD (chronic obstructive pulmonary disease) (HCC)    followed by pcp----  (05-28-2021  pt stated last used rescue 05-27-2021 for wheezing resolved after use, stated exacerbation approx  greater than a year ago)   Coronary artery disease 2002   cardiologist-- dr lavona ;  (05-28-2021  pt denies need to use nitro for CP) per last cath 08-04-2017  LM luminal irregularities, LAD 30% ,D1 ostial 35% ,CFx   AV groove ostial 25% , OM1 long prox 30%,  proxRCA occluded and dominant,   excellent left to right collateral filling.   Depression    Patient denies   DOE (dyspnea on exertion)    due to smoking/ copd   Dyslipidemia    Essential hypertension    Full dentures    GERD (gastroesophageal reflux disease)    History of adenomatous polyp of colon    History of COVID-19    per pt winter 2022, mild to moderate symptoms that resolved   History of diverticulitis of colon    History of kidney stones    Hyperlipidemia    Hypothyroidism    followed by pcp   Lung nodule    s/p bronchoscopy w/ bx in 2005,  benign   Osteoporosis    Smokers' cough (HCC)    05-28-2021  pt stated occasionally productive w/ white phlegm   Wears glasses     Assessment: Patient Reported Symptoms: Cognitive Cognitive Status: No symptoms reported, Able to follow simple commands, Alert and oriented to person, place, and time, Normal speech and language skills      Neurological Neurological Review of Symptoms: No symptoms  reported    HEENT HEENT Symptoms Reported: No symptoms reported      Cardiovascular Cardiovascular Symptoms Reported: No symptoms reported    Respiratory Respiratory Symptoms Reported: No symptoms reported Other Respiratory Symptoms: pt continues doing well with smoking cessation,  not wearing nicotine  patch and states  doing fine without it Additional Respiratory Details: using oxygen at 2 liters via [place]  prn     pt states he has been walking more, no dyspnea reported    Endocrine Endocrine Symptoms Reported: No symptoms reported Is patient diabetic?: No    Gastrointestinal Gastrointestinal Symptoms Reported: No symptoms reported      Genitourinary Genitourinary Symptoms Reported: No symptoms reported    Integumentary Integumentary Symptoms Reported: No symptoms reported    Musculoskeletal Musculoskelatal Symptoms Reviewed: No symptoms reported        Psychosocial Psychosocial Symptoms Reported: No symptoms reported Additional Psychological Details: pt states he has not anxiety recently, feels the hydroxyzine  is helping and uses prn, has not need to use this medication much at all         There were no vitals filed for this visit.  Medications Reviewed Today   Medications were not reviewed in this encounter     Recommendation:   PCP follow up 03/14/24 Specialists  follow up- cardiology 02/26/24, pulmonary 03/24/24  Follow Up Plan:   Telephone follow-up 02/24/24 @ 930 am  Mliss Creed [facility], BSN RN Care Manager/ Transition of Care [place]/ [facility] Population Health [phone]

## 2024-02-24 ENCOUNTER — Other Ambulatory Visit: Payer: Self-pay | Admitting: *Deleted

## 2024-02-24 NOTE — Patient Outreach (Signed)
 Transition of Care week 4  Visit Note  02/24/2024  Name: [name] [name] MRN: [id number]          DOB: March 21, 1951  Situation: Patient enrolled in [facility] 30-day program. Visit completed with patient by telephone.   Background:   Past Medical History:  Diagnosis Date   Arthritis    Carotid artery stenosis without cerebral infarction    vascular --- dr eliza;   09-24-2007  s/p right CEA   Chronic cholecystitis    w/  large calcified stone in gallbladder   Chronic constipation    COPD (chronic obstructive pulmonary disease) (HCC)    followed by pcp----  (05-28-2021  pt stated last used rescue 05-27-2021 for wheezing resolved after use, stated exacerbation approx  greater than a year ago)   Coronary artery disease 2002   cardiologist-- dr lavona ;  (05-28-2021  pt denies need to use nitro for CP) per last cath 08-04-2017  LM luminal irregularities, LAD 30% ,D1 ostial 35% ,CFx   AV groove ostial 25% , OM1 long prox 30%,  proxRCA occluded and dominant,   excellent left to right collateral filling.   Depression    Patient denies   DOE (dyspnea on exertion)    due to smoking/ copd   Dyslipidemia    Essential hypertension    Full dentures    GERD (gastroesophageal reflux disease)    History of adenomatous polyp of colon    History of COVID-19    per pt winter 2022, mild to moderate symptoms that resolved   History of diverticulitis of colon    History of kidney stones    Hyperlipidemia    Hypothyroidism    followed by pcp   Lung nodule    s/p bronchoscopy w/ bx in 2005,  benign   Osteoporosis    Smokers' cough (HCC)    05-28-2021  pt stated occasionally productive w/ white phlegm   Wears glasses     Assessment: Patient Reported Symptoms: Cognitive Cognitive Status: No symptoms reported, Alert and oriented to person, place, and time, Able to follow simple commands, Normal speech and language skills      Neurological Neurological Review of Symptoms: No symptoms reported     HEENT HEENT Symptoms Reported: No symptoms reported      Cardiovascular Cardiovascular Symptoms Reported: No symptoms reported    Respiratory Respiratory Symptoms Reported: No symptoms reported Other Respiratory Symptoms: pt continues doing well with smoking cessation Additional Respiratory Details: using oxygen at 2 liters via [place] prn,  no dyspnea reported,  02 saturation today 95% Respiratory Management Strategies: Adequate rest, Routine screening, Medication therapy, Oxygen therapy Respiratory Self-Management Outcome: 4 (good)  Endocrine Endocrine Symptoms Reported: No symptoms reported Is patient diabetic?: No    Gastrointestinal Gastrointestinal Symptoms Reported: No symptoms reported      Genitourinary Genitourinary Symptoms Reported: No symptoms reported    Integumentary Integumentary Symptoms Reported: No symptoms reported    Musculoskeletal Musculoskelatal Symptoms Reviewed: No symptoms reported        Psychosocial Psychosocial Symptoms Reported: No symptoms reported Additional Psychological Details: pt states anxiety is under much better control, continues using hydroxyizine prn although has not needed to use this medication much at all Behavioral Management Strategies: Medication therapy, Coping strategies Behavioral Health Self-Management Outcome: 4 (good) Behavioral Health Comment: reviewed importance of relaxation, keeping stress to a minimum       There were no vitals filed for this visit.  Medications Reviewed Today     Reviewed by  Aura Mliss LABOR, RN (Registered Nurse) on 02/24/24 at 1012  Med List Status: <None>   Medication Order Taking? Sig Documenting Provider Last Dose Status Informant  albuterol  (PROVENTIL ) (2.5 MG/3ML) 0.083% nebulizer solution [id number]  Take 3 mLs (2.5 mg total) by nebulization every 4 (four) hours as needed for wheezing or shortness of breath. Pearlean Manus, MD  Active   aspirin  EC 81 MG tablet [id number]  Take 1 tablet (81 mg  total) by mouth daily with breakfast. Swallow whole. Pearlean Manus, MD  Active   Bempedoic Acid -Ezetimibe  (NEXLIZET ) 180-10 MG TABS [id number]  TAKE ONE TABLET DAILY Dettinger, [name], MD  Active Self, Pharmacy Records  budesonide -glycopyrrolate -formoterol  (BREZTRI  AEROSPHERE) 160-9-4.8 MCG/ACT AERO inhaler [id number]  Inhale 2 puffs into the lungs in the morning and at bedtime. Pearlean Manus, MD  Active   fluticasone  (FLONASE ) 50 MCG/ACT nasal spray [id number]  Place 2 sprays into both nostrils daily. [place] Thompson, Nena, NP  Active Self, Pharmacy Records  guaiFENesin  (MUCINEX ) 600 MG 12 hr tablet [id number]  Take 1 tablet (600 mg total) by mouth 2 (two) times daily. Pearlean Manus, MD  Active   hydrOXYzine  (VISTARIL ) 25 MG capsule [id number]  Take 1 capsule (25 mg total) by mouth every 8 (eight) hours as needed. Dettinger, [name], MD  Active   isosorbide  mononitrate (IMDUR ) 30 MG 24 hr tablet [id number]  Take 1 tablet (30 mg total) by mouth daily. Pearlean Manus, MD  Active   levothyroxine  (SYNTHROID ) 50 MCG tablet [id number]  Take 1 tablet (50 mcg total) by mouth daily before breakfast. Pearlean Manus, MD  Active   nicotine  (NICODERM CQ ) 14 mg/24hr patch [id number]  Place 1 patch (14 mg total) onto the skin daily. Dettinger, [name], MD  Active   omeprazole  (PRILOSEC) 20 MG capsule [id number]  Take 1 capsule (20 mg total) by mouth daily. Pearlean Manus, MD  Active   OXYGEN [id number]  Inhale 2 L into the lungs daily. [provider]  Active   sertraline  (ZOLOFT ) 50 MG tablet [id number]  Take 1 tablet (50 mg total) by mouth daily.  Patient not taking: Reported on 02/24/2024   Dettinger, [name], MD  Active   traZODone  (DESYREL ) 100 MG tablet [id number]  Take 1 tablet (100 mg total) by mouth at bedtime. Dettinger, [name], MD  Active   valsartan  (DIOVAN ) 160 MG tablet [id number]  Take 1 tablet (160 mg total) by mouth daily. Pearlean Manus, MD  Active              Recommendation:   PCP Follow-up 03/14/24 Cardiologist 02/26/24 Lung CT  04/15/24 Pulmonary  03/24/24  Follow Up Plan:   Telephone follow-up 03/02/24 @ 930 am  Mliss Aura [facility], BSN RN Care Manager/ Transition of Care [place]/ [facility] Population Health [phone]

## 2024-02-24 NOTE — Patient Instructions (Signed)
 Visit Information  Thank you for taking time to visit with me today. Please don't hesitate to contact me if I can be of assistance to you before our next scheduled telephone appointment.  Our next appointment is by telephone on 03/02/24 @ 930 am  Following is a copy of your care plan:   Goals Addressed             This Visit's Progress    [facility] Transitions of Care (TOC) Care Plan       Problems:  Recent Hospitalization for treatment of COPD exacerbation No Hospital Follow Up Provider appointment - pt requests RN CM schedule appointment Pt has pulse oximeter, reading now on room air is 95%, pt states oxygen is set on 2 liters and wears it most of the time 02/09/24- spoke with pt who reports there is a bad connection with the phone, sounds like a fax machine, was able to complete part of today's call, pt ask for RN CM to hang up and call right back,  RN CM called back and no answer to phone. 02/10/24- spoke with pt who reports he saw NP at primary care provider office today 8/20 and reported having some anxiety episodes that do not happen everyday, few times per week, he now has appointment with his primary care provider on 02/11/24 to discuss further, no other concerns reported 02/17/24- pt reports he saw primary care provider 8/21- prescribed medication for anxiety, taking hydroxyzine  prn, choosing not to take sertraline , is not wearing nicotine  patch, does not feel like he needs now, has not smoked in 2 weeks, pt is trying to get outdoors and walk daily, denies any anxiety today 02/24/24- pt reports he is doing well, anxiety is under good control, continues doing well with smoking cessation  Goal:  Over the next 30 days, the patient will not experience hospital readmission  Interventions:  COPD Interventions: Advised patient to track and manage COPD triggers Advised patient to self assesses COPD action plan zone and make appointment with provider if in the yellow zone for 48 hours without  improvement Discussed the importance of adequate rest and management of fatigue with COPD Provided education about and advised patient to utilize infection prevention strategies to reduce risk of respiratory infection Provided instruction about proper use of medications used for management of COPD including inhalers Use of home oxygen Encouraged and congratulated pt on smoking cessation Reviewed relaxation techniques if feeling anxious, keeping stress to a minimum Reviewed medications, pt is choosing not to take sertraline  Reinforced importance of light exercise  Patient Self Care Activities:  Attend all scheduled provider appointments Attend church or other social activities Call pharmacy for medication refills 3-7 days in advance of running out of medications Call provider office for new concerns or questions  Notify RN Care Manager of TOC call rescheduling needs Participate in Transition of Care Program/Attend TOC scheduled calls Perform all self care activities independently  Perform IADL's (shopping, preparing meals, housekeeping, managing finances) independently Take medications as prescribed   eliminate smoking in my home identify and remove indoor air pollutants listen for public air quality announcements every day do breathing exercises every day develop a rescue plan eliminate symptom triggers at home keep follow-up appointments: primary care provider 02/11/24 , pulmonary 03/24/24 get at least 7 to 8 hours of sleep at night practice relaxation or meditation daily Practice relaxation daily and keep stress to a minimum Keep up the good work with smoking cessation!  Plan: Telephone outreach 03/02/24 @ 930  am        Patient verbalizes understanding of instructions and care plan provided today and agrees to view in MyChart. Active MyChart status and patient understanding of how to access instructions and care plan via MyChart confirmed with patient.     Telephone follow up  appointment with care management team member scheduled for:  03/02/24 @ 930 am  Please call the care guide team at [phone] if you need to cancel or reschedule your appointment.   Please call the Suicide and Crisis Lifeline: 988 call the [place]  National Suicide Prevention Lifeline: 534-244-4207 or TTY: 629-606-3440 TTY (905)708-4745) to talk to a trained counselor call 1-800-273-TALK (toll free, 24 hour hotline) go to [facility] [address] [place] [phone]) call the [facility] Crisis Line: 704-716-9350 call 911 if you are experiencing a Mental Health or Behavioral Health Crisis or need someone to talk to.  Mliss Creed [facility], BSN RN Care Manager/ Transition of Care [place]/ [facility] [phone]

## 2024-02-26 ENCOUNTER — Encounter: Payer: Self-pay | Admitting: Nurse Practitioner

## 2024-02-26 ENCOUNTER — Ambulatory Visit: Attending: Nurse Practitioner | Admitting: Nurse Practitioner

## 2024-02-26 VITALS — BP 112/66 | HR 79 | Ht 70.0 in | Wt 136.0 lb

## 2024-02-26 DIAGNOSIS — I6529 Occlusion and stenosis of unspecified carotid artery: Secondary | ICD-10-CM | POA: Diagnosis not present

## 2024-02-26 DIAGNOSIS — R0789 Other chest pain: Secondary | ICD-10-CM | POA: Diagnosis not present

## 2024-02-26 DIAGNOSIS — I10 Essential (primary) hypertension: Secondary | ICD-10-CM

## 2024-02-26 DIAGNOSIS — J449 Chronic obstructive pulmonary disease, unspecified: Secondary | ICD-10-CM | POA: Diagnosis not present

## 2024-02-26 DIAGNOSIS — I2510 Atherosclerotic heart disease of native coronary artery without angina pectoris: Secondary | ICD-10-CM

## 2024-02-26 DIAGNOSIS — E785 Hyperlipidemia, unspecified: Secondary | ICD-10-CM

## 2024-02-26 MED ORDER — NITROGLYCERIN 0.4 MG SL SUBL: 0.4000 mg | SUBLINGUAL_TABLET | SUBLINGUAL | 3 refills | Status: AC | PRN

## 2024-02-26 NOTE — Patient Instructions (Signed)
 Medication Instructions:  Your physician has recommended you make the following change in your medication:  The proper use and anticipated side effects of nitroglycerine has been carefully explained.  If a single episode of chest pain is not relieved by one tablet, the patient will try another within 5 minutes; and if this doesn't relieve the pain, the patient is instructed to call 911 for transportation to an emergency department.  Labwork: None   Testing/Procedures: None   Follow-Up: Your physician recommends that you schedule a follow-up appointment in: 6 Months   Any Other Special Instructions Will Be Listed Below (If Applicable).  If you need a refill on your cardiac medications before your next appointment, please call your pharmacy.

## 2024-02-26 NOTE — Progress Notes (Signed)
 Cardiology Office Note   Date: 02/26/2024 ID:  [name] [name], DOB [birth year], MRN [id number] PCP: Dettinger, [name], MD  [facility] Providers Cardiologist:  Lynwood Schilling, MD     History of Present Illness [name] [name] is a 73 y.o. male with a PMH of CAD, mixed HLD, HTN, hx of carotid artery stenosis, s/p right CEA, and tobacco use, and COPD, who presents today for hospital follow-up.   Last seen by Dr. Schilling on Oct 29, 2022. Was overall doing well at the time. Was referred to [facility] for a PCSK9i and started on Norvasc  5 mg daily.   Recent hospitalization July 2025 due to atypical CP. Was scheduled for OP Lexiscan . Echo normal. See most recent results of OP Lexiscan  below.   01/29/2024 - He is here for follow-up. He states he is not doing well at all for the past 2 weeks. Mentions worsening shortness of breath and dyspea on exertion at rest. Has been so short of breath recently, says he's been having panic attacks because of it and says he strongly believes he needs to go to the ED to get help. Said he had a bad panic attack last night. Admits to DOE with minimal exertion and even DOE at rest. Denies any chest pain, palpitations, syncope, presyncope, dizziness, orthopnea, PND, swelling or significant weight changes, acute bleeding, or claudication.  She has saw pulmonology yesterday.  Lisinopril  was stopped and switched to valsartan  under 160 mg daily.  Working on getting him qualified for oxygen and inhaler was switched.  He says he checked his SpO2 this morning on his pulse ox on room air, was 82%.  He was short of breath at that time when he checked it.  Does tell me that pulmonology gave an order for oxygen, but says it has not come in yet.  Has been also having issues with his abdomen, admits to feeling of being bloated/also noticing weight loss/constipation.  He was in the hospital in August 2025 due to acute respiratory failure with hypoxia and  community-acquired pneumonia of right lung.  Treated with antibiotics, mucolytic's and bronchodilators.  Discharged on antibiotics.  02/26/2024 -today he is here for follow-up with his wife.  He tells me he now has his oxygen, currently not wearing it today for office visit.  SpO2 today during vital signs is 99% on room air.  Tells me he is feeling better since leaving the hospital.  Does experience chest tightness when he has shortness of breath. Denies any chest pain, shortness of breath, palpitations, syncope, presyncope, dizziness, orthopnea, PND, swelling or significant weight changes, acute bleeding, or claudication.   ROS: Negative.  See HPI.  Studies Reviewed  EKG: EKG is not ordered today.   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 The study is intermediate risk. Risk based on decreased LVEF, there is no current myocardium at jeopardy. Consider correlating LVEF with echo.   No ST deviation was noted.   LV perfusion is abnormal. Large severe intensity inferior/inferoapical defect that is fixed. Defect complicated by adjacent gut radiotracer uptake, however wall motion abnormality would support prior infarct. No current ischemia.   Left ventricular function is abnormal. Nuclear stress EF: 45%. The left ventricular ejection fraction is mildly decreased (45-54%). End diastolic cavity size is normal.  Echocardiogram 12/2023:  1. Challenging study due to COPD. Poor Echo images. Limited interrogation  of valves.   2. Left ventricular ejection fraction, by estimation, is 55 to  60%. The  left ventricle has normal function. The left ventricle has no regional  wall motion abnormalities. Left ventricular diastolic parameters are  consistent with Grade I diastolic  dysfunction (impaired relaxation).   3. Right ventricular systolic function is normal. The right ventricular  size is normal. There is normal pulmonary artery systolic pressure.   4. The  mitral valve was not well visualized. No evidence of mitral valve  regurgitation. No evidence of mitral stenosis.   5. The aortic valve was not well visualized. Aortic valve regurgitation  is not visualized. No aortic stenosis is present.   6. The inferior vena cava is normal in size with greater than 50%  respiratory variability, suggesting right atrial pressure of 3 mmHg.  Carotid duplex 06/2022: Summary:  Right Carotid: Velocities in the right ICA are consistent with a 1-39%  stenosis.   Left Carotid: Velocities in the left ICA are consistent with a 40-59%  stenosis.   Vertebrals: Bilateral vertebral arteries demonstrate antegrade flow.  Subclavians: Normal flow hemodynamics were seen in bilateral subclavian               arteries.   *See table(s) above for measurements and observations.  Suggest follow up study in 12 months.  AAA 06/2021:  Summary:  Abdominal Aorta: There is evidence of abnormal ectasia of the distal  Abdominal aorta. The largest aortic measurement is 2.5 cm. The largest  aortic diameter remains essentially unchanged compared to prior exam.  Previous diameter measurement was 2.5 cm  obtained on 02/11/16.  Stenosis: +--------------------+-------------+------------+  Location            Stenosis     Comments      +--------------------+-------------+------------+  Right External Iliac>50% stenosismild/low-end  +--------------------+-------------+------------+      IVC/Iliac: There is no evidence of thrombus involving the IVC.    *See table(s) above for measurements and observations.  Suggest follow up study in 24 months.  LHC 07/2017:  Prox RCA to Mid RCA lesion is 100% stenosed. Ost Cx to Prox Cx lesion is 60% stenosed. Ost 1st Mrg lesion is 40% stenosed. Mid LAD to Dist LAD lesion is 50% stenosed. Dist LM to Prox LAD lesion is 20% stenosed. The left ventricular systolic function is normal. LV end diastolic pressure is normal. The left  ventricular ejection fraction is 55-65% by visual estimate. There is no mitral valve regurgitation.   1. Chronic total occlusion of the large, dominant mid RCA with filling of the distal RCA from right to right bridging collaterals and left to right collaterals.  2. The LAD is a large caliber vessel that courses to the apex. The mid to distal LAD has a moderate stenosis. This does not appear to be flow limiting. The first diagonal branch is moderate in caliber and has a moderate stenosis.  3. The Circumflex artery is a moderate caliber vessel that supplies two OM branches. There is a moderate stenosis in the proximal Circumflex extending back to the ostium. This is looked at closely in multiple views and does not appear to be flow limiting. This involves a moderate caliber first OM branch which has moderate ostial stenosis.  4. Normal LV systolic function   Recommendations: I would continue with medical management of his CAD. Consider addition of long acting nitrates. Tobacco cessation is recommended.   Physical Exam VS:  BP 112/66 (BP Location: Left Arm)   Pulse 79   Ht 5' 10 (1.778 m)   Wt 136 lb (61.7 kg)   SpO2  99%   BMI 19.51 kg/m        Wt Readings from Last 3 Encounters:  02/26/24 136 lb (61.7 kg)  02/11/24 136 lb (61.7 kg)  02/10/24 136 lb 12.8 oz (62.1 kg)    GEN: Thin, 73 year old male in acute distress NECK: No JVD; No carotid bruits CARDIAC: S1/S2, RRR, no murmurs, rubs, gallops RESPIRATORY:  Diminished to auscultation with labored work of breathing, tachypnea  ABDOMEN: Soft, non-tender, non-distended EXTREMITIES:  No edema; No deformity   ASSESSMENT AND PLAN  Chest tightness, CAD Does admit to some chest tightness that gets improved after use of his inhaler.  Seems to be related to his COPD.  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  Recent echo showed normal LVEF.  No indication for ischemic evaluation further at  this time.  Will prescribe nitroglycerin  as needed for chest pain.  No other medication changes at this time. Care and ED precautions discussed.   HLD, carotid artery stenosis LDL 61 from April 2025.  Continue current medication regimen.  Carotid duplex from last year showed no significant stenosis along right carotid with moderate left carotid artery stenosis, recommended to repeat testing in 1 year.  Not addressed, but will consider repeating this at next office visit.  Heart healthy diet and regular cardiovascular exercise encouraged.    HTN BP stable.  Continue current medication regimen. Discussed to monitor BP at home at least 2 hours after medications and sitting for 5-10 minutes. Heart healthy diet and regular cardiovascular exercise encouraged.   4. COPD He has quit smoking.  I congratulated him.  Continue follow-up with pulmonology who is managing this.  Care and ED precautions discussed.    Dispo: Follow-up with MD/APP in 6 months or sooner if any changes.  Signed, [name] Crate, NP

## 2024-03-02 ENCOUNTER — Other Ambulatory Visit: Payer: Self-pay | Admitting: *Deleted

## 2024-03-02 NOTE — Patient Outreach (Signed)
 Transition of Care week 5  Visit Note  03/02/2024  Name: [name] [name] MRN: [id number]          DOB: January 05, 1951  Situation: Patient enrolled in [facility] 30-day program. Visit completed with patient by telephone.   Background:    Past Medical History:  Diagnosis Date   Arthritis    Carotid artery stenosis without cerebral infarction    vascular --- dr eliza;   09-24-2007  s/p right CEA   Chronic cholecystitis    w/  large calcified stone in gallbladder   Chronic constipation    COPD (chronic obstructive pulmonary disease) (HCC)    followed by pcp----  (05-28-2021  pt stated last used rescue 05-27-2021 for wheezing resolved after use, stated exacerbation approx  greater than a year ago)   Coronary artery disease 2002   cardiologist-- dr lavona ;  (05-28-2021  pt denies need to use nitro for CP) per last cath 08-04-2017  LM luminal irregularities, LAD 30% ,D1 ostial 35% ,CFx   AV groove ostial 25% , OM1 long prox 30%,  proxRCA occluded and dominant,   excellent left to right collateral filling.   Depression    Patient denies   DOE (dyspnea on exertion)    due to smoking/ copd   Dyslipidemia    Essential hypertension    Full dentures    GERD (gastroesophageal reflux disease)    History of adenomatous polyp of colon    History of COVID-19    per pt winter 2022, mild to moderate symptoms that resolved   History of diverticulitis of colon    History of kidney stones    Hyperlipidemia    Hypothyroidism    followed by pcp   Lung nodule    s/p bronchoscopy w/ bx in 2005,  benign   Osteoporosis    Smokers' cough (HCC)    05-28-2021  pt stated occasionally productive w/ white phlegm   Wears glasses     Assessment: Patient Reported Symptoms: Cognitive Cognitive Status: No symptoms reported, Alert and oriented to person, place, and time, Normal speech and language skills, Able to follow simple commands      Neurological Neurological Review of Symptoms: No symptoms  reported    HEENT HEENT Symptoms Reported: No symptoms reported      Cardiovascular Cardiovascular Symptoms Reported: No symptoms reported    Respiratory Respiratory Symptoms Reported: Shortness of breath Other Respiratory Symptoms: dypsnea with exertion only,  pt continues doing well with smoking cessation Additional Respiratory Details: using oxygen at 2 liters via [place] prn, 02 saturations 93-95% room air Respiratory Management Strategies: Adequate rest, Routine screening Respiratory Self-Management Outcome: 4 (good)  Endocrine Endocrine Symptoms Reported: No symptoms reported Is patient diabetic?: No    Gastrointestinal Gastrointestinal Symptoms Reported: No symptoms reported      Genitourinary Genitourinary Symptoms Reported: No symptoms reported    Integumentary Integumentary Symptoms Reported: No symptoms reported    Musculoskeletal Musculoskelatal Symptoms Reviewed: No symptoms reported        Psychosocial Psychosocial Symptoms Reported: No symptoms reported Additional Psychological Details: pt feels anxiety is under good control at present Behavioral Management Strategies: Medication therapy Behavioral Health Self-Management Outcome: 4 (good) Behavioral Health Comment: reinforced importance of relaxation, keeping stress to a minimum       There were no vitals filed for this visit.  Medications Reviewed Today     Reviewed by Aura Mliss LABOR, RN (Registered Nurse) on 03/02/24 at 1000  Med List Status: <None>   Medication  Order Taking? Sig Documenting Provider Last Dose Status Informant  albuterol  (PROVENTIL ) (2.5 MG/3ML) 0.083% nebulizer solution [id number]  Take 3 mLs (2.5 mg total) by nebulization every 4 (four) hours as needed for wheezing or shortness of breath. Pearlean Manus, MD  Active   aspirin  EC 81 MG tablet [id number]  Take 1 tablet (81 mg total) by mouth daily with breakfast. Swallow whole. Pearlean Manus, MD  Active   Bempedoic Acid -Ezetimibe  (NEXLIZET )  180-10 MG TABS [id number]  TAKE ONE TABLET DAILY Dettinger, [name], MD  Active Self, Pharmacy Records  budesonide -glycopyrrolate -formoterol  (BREZTRI  AEROSPHERE) 160-9-4.8 MCG/ACT AERO inhaler [id number]  Inhale 2 puffs into the lungs in the morning and at bedtime. Pearlean Manus, MD  Active   fluticasone  (FLONASE ) 50 MCG/ACT nasal spray [id number]  Place 2 sprays into both nostrils daily. [facility] [place] Hummer, Nena, NP  Active Self, Pharmacy Records  guaiFENesin  (MUCINEX ) 600 MG 12 hr tablet [id number]  Take 1 tablet (600 mg total) by mouth 2 (two) times daily. Pearlean Manus, MD  Active   hydrOXYzine  (VISTARIL ) 25 MG capsule [id number]  Take 1 capsule (25 mg total) by mouth every 8 (eight) hours as needed. Dettinger, [name], MD  Active   isosorbide  mononitrate (IMDUR ) 30 MG 24 hr tablet [id number]  Take 1 tablet (30 mg total) by mouth daily. Pearlean Manus, MD  Active   levothyroxine  (SYNTHROID ) 50 MCG tablet [id number]  Take 1 tablet (50 mcg total) by mouth daily before breakfast. Pearlean Manus, MD  Active   nicotine  (NICODERM CQ ) 14 mg/24hr patch [id number]  Place 1 patch (14 mg total) onto the skin daily. Dettinger, [name], MD  Active   nitroGLYCERIN  (NITROSTAT ) 0.4 MG SL tablet [id number]  Place 1 tablet (0.4 mg total) under the tongue every 5 (five) minutes as needed for chest pain. Miriam Norris, NP  Active   omeprazole  (PRILOSEC) 20 MG capsule [id number]  Take 1 capsule (20 mg total) by mouth daily. Pearlean Manus, MD  Active   OXYGEN [id number]  Inhale 2 L into the lungs daily. [provider]  Active   sertraline  (ZOLOFT ) 50 MG tablet [id number]  Take 1 tablet (50 mg total) by mouth daily.  Patient not taking: Reported on 02/26/2024   Dettinger, [name], MD  Active   traZODone  (DESYREL ) 100 MG tablet [id number]  Take 1 tablet (100 mg total) by mouth at bedtime. Dettinger, [name], MD  Active   valsartan  (DIOVAN ) 160 MG tablet [id number]  Take 1 tablet (160 mg total) by mouth  daily. Pearlean Manus, MD  Active             Goals Addressed             This Visit's Progress    COMPLETED: VBCI Transitions of Care (TOC) Care Plan       Problems:  Recent Hospitalization for treatment of COPD exacerbation No Hospital Follow Up Provider appointment - pt requests RN CM schedule appointment Pt has pulse oximeter, reading now on room air is 95%, pt states oxygen is set on 2 liters and wears it most of the time 02/09/24- spoke with pt who reports there is a bad connection with the phone, sounds like a fax machine, was able to complete part of today's call, pt ask for RN CM to hang up and call right back,  RN CM called back and no answer to phone. 02/10/24- spoke with pt who reports he saw NP at primary care provider office today 8/20 and reported having some  anxiety episodes that do not happen everyday, few times per week, he now has appointment with his primary care provider on 02/11/24 to discuss further, no other concerns reported 02/17/24- pt reports he saw primary care provider 8/21- prescribed medication for anxiety, taking hydroxyzine  prn, choosing not to take sertraline , is not wearing nicotine  patch, does not feel like he needs now, has not smoked in 2 weeks, pt is trying to get outdoors and walk daily, denies any anxiety today 03/02/24- pt reports he is doing well, anxiety is under good control, continues doing well with smoking cessation, no new concerns reported  Goal:  Over the next 30 days, the patient will not experience hospital readmission  Interventions:  COPD Interventions: Advised patient to track and manage COPD triggers Advised patient to self assesses COPD action plan zone and make appointment with provider if in the yellow zone for 48 hours without improvement Discussed the importance of adequate rest and management of fatigue with COPD Provided education about and advised patient to utilize infection prevention strategies to reduce risk of  respiratory infection Provided instruction about proper use of medications used for management of COPD including inhalers Use of home oxygen Encouraged and congratulated pt on smoking cessation Reinforced relaxation techniques if feeling anxious, keeping stress to a minimum Reviewed importance of light exercise Reviewed plan of care with pt including case closure  Patient Self Care Activities:  Attend all scheduled provider appointments Attend church or other social activities Call pharmacy for medication refills 3-7 days in advance of running out of medications Call provider office for new concerns or questions  Notify RN Care Manager of TOC call rescheduling needs Participate in Transition of Care Program/Attend TOC scheduled calls Perform all self care activities independently  Perform IADL's (shopping, preparing meals, housekeeping, managing finances) independently Take medications as prescribed   eliminate smoking in my home identify and remove indoor air pollutants listen for public air quality announcements every day do breathing exercises every day develop a rescue plan eliminate symptom triggers at home keep follow-up appointments: primary care provider 02/11/24 , pulmonary 03/24/24 get at least 7 to 8 hours of sleep at night practice relaxation or meditation daily Practice relaxation daily and keep stress to a minimum Keep up the good work with smoking cessation! TOC case closure  Plan: No further follow up required- case closure        Recommendation:   PCP Follow-up  Follow Up Plan:   Closing From:  Transitions of Care Program  Mliss Creed [facility], BSN RN Care Manager/ Transition of Care [place]/ [facility] Population [facility] [phone]

## 2024-03-02 NOTE — Patient Instructions (Signed)
 Visit Information  Thank you for taking time to visit with me today. Please don't hesitate to contact me if I can be of assistance to you before our next scheduled telephone appointment.  Our next appointment is no further scheduled appointments.    Following is a copy of your care plan:   Goals Addressed             This Visit's Progress    COMPLETED: VBCI Transitions of Care (TOC) Care Plan       Problems:  Recent Hospitalization for treatment of COPD exacerbation No Hospital Follow Up Provider appointment - pt requests RN CM schedule appointment Pt has pulse oximeter, reading now on room air is 95%, pt states oxygen is set on 2 liters and wears it most of the time 02/09/24- spoke with pt who reports there is a bad connection with the phone, sounds like a fax machine, was able to complete part of today's call, pt ask for RN CM to hang up and call right back,  RN CM called back and no answer to phone. 02/10/24- spoke with pt who reports he saw NP at primary care provider office today 8/20 and reported having some anxiety episodes that do not happen everyday, few times per week, he now has appointment with his primary care provider on 02/11/24 to discuss further, no other concerns reported 02/17/24- pt reports he saw primary care provider 8/21- prescribed medication for anxiety, taking hydroxyzine  prn, choosing not to take sertraline , is not wearing nicotine  patch, does not feel like he needs now, has not smoked in 2 weeks, pt is trying to get outdoors and walk daily, denies any anxiety today 03/02/24- pt reports he is doing well, anxiety is under good control, continues doing well with smoking cessation, no new concerns reported  Goal:  Over the next 30 days, the patient will not experience hospital readmission  Interventions:  COPD Interventions: Advised patient to track and manage COPD triggers Advised patient to self assesses COPD action plan zone and make appointment with provider  if in the yellow zone for 48 hours without improvement Discussed the importance of adequate rest and management of fatigue with COPD Provided education about and advised patient to utilize infection prevention strategies to reduce risk of respiratory infection Provided instruction about proper use of medications used for management of COPD including inhalers Use of home oxygen Encouraged and congratulated pt on smoking cessation Reinforced relaxation techniques if feeling anxious, keeping stress to a minimum Reviewed importance of light exercise Reviewed plan of care with pt including case closure  Patient Self Care Activities:  Attend all scheduled provider appointments Attend church or other social activities Call pharmacy for medication refills 3-7 days in advance of running out of medications Call provider office for new concerns or questions  Notify RN Care Manager of TOC call rescheduling needs Participate in Transition of Care Program/Attend TOC scheduled calls Perform all self care activities independently  Perform IADL's (shopping, preparing meals, housekeeping, managing finances) independently Take medications as prescribed   eliminate smoking in my home identify and remove indoor air pollutants listen for public air quality announcements every day do breathing exercises every day develop a rescue plan eliminate symptom triggers at home keep follow-up appointments: primary care provider 02/11/24 , pulmonary 03/24/24 get at least 7 to 8 hours of sleep at night practice relaxation or meditation daily Practice relaxation daily and keep stress to a minimum Keep up the good work with smoking cessation! TOC case closure  Plan: No further follow up required- case closure        Patient verbalizes understanding of instructions and care plan provided today and agrees to view in MyChart. Active MyChart status and patient understanding of how to access instructions and care plan  via MyChart confirmed with patient.     No further follow up required: case closure  Please call the care guide team at [phone] if you need to cancel or reschedule your appointment.   Please call the Suicide and Crisis Lifeline: 988 call the [place]  National Suicide Prevention Lifeline: (573) 674-4853 or TTY: (231) 828-4562 TTY 4147724962) to talk to a trained counselor call 1-800-273-TALK (toll free, 24 hour hotline) go to [facility] [address] [place] [phone]) call 911 if you are experiencing a Mental Health or Behavioral Health Crisis or need someone to talk to.  Mliss Creed [facility], BSN RN Care Manager/ Transition of Care [place]/ [facility] [phone]

## 2024-03-07 DIAGNOSIS — J441 Chronic obstructive pulmonary disease with (acute) exacerbation: Secondary | ICD-10-CM | POA: Diagnosis not present

## 2024-03-14 ENCOUNTER — Encounter: Payer: Self-pay | Admitting: Family Medicine

## 2024-03-14 ENCOUNTER — Ambulatory Visit (INDEPENDENT_AMBULATORY_CARE_PROVIDER_SITE_OTHER): Admitting: Family Medicine

## 2024-03-14 VITALS — BP 139/66 | HR 81 | Ht 70.0 in | Wt 135.0 lb

## 2024-03-14 DIAGNOSIS — J449 Chronic obstructive pulmonary disease, unspecified: Secondary | ICD-10-CM

## 2024-03-14 DIAGNOSIS — F419 Anxiety disorder, unspecified: Secondary | ICD-10-CM | POA: Insufficient documentation

## 2024-03-14 DIAGNOSIS — J418 Mixed simple and mucopurulent chronic bronchitis: Secondary | ICD-10-CM

## 2024-03-14 NOTE — Progress Notes (Signed)
 BP 139/66   Pulse 81   Ht 5' 10 (1.778 m)   Wt 135 lb (61.2 kg)   SpO2 94%   BMI 19.37 kg/m    Subjective:   Patient ID: [name] [name], male    DOB: 1951-05-25, 73 y.o.   MRN: [id number]  HPI: [name] [name] is a 73 y.o. male presenting on 03/14/2024 for Medical Management of Chronic Issues and Anxiety   Discussed the use of AI scribe software for clinical note transcription with the patient, who gave verbal consent to proceed.  History of Present Illness   [name] [name] is a 73 year old male who presents with anxiety and breathing difficulties.  He has been experiencing anxiety, which has improved compared to last year, with fewer panic attacks. He experienced adverse effects from Zoloft  after taking just one dose, leading to its discontinuation.  He has a history of lung disease and reports improvement in his breathing. His oxygen saturation is around 93-94% at rest, dropping to 88-87% with activity. He uses Breztri  and albuterol  inhalers and has breathing treatments available. He has not smoked cigarettes for almost seven weeks, which he believes is contributing to his lung health improvement.  He is concerned about his ability to return to work due to his lung condition, noting that his job requires physical activity, which he may not be able to perform. He is working on building his stamina through walking and other activities.  He has successfully quit smoking, having not had a cigarette since his last visit, and he keeps a pack as a reminder of his progress. He has not used nicotine  patches and feels he does not need them.           03/14/2024    2:16 PM 02/11/2024    1:16 PM 02/11/2024    1:15 PM 02/02/2024    2:55 PM 09/02/2023    1:56 PM  Depression screen PHQ 2/9  Decreased Interest 0  0 0 0  Down, Depressed, Hopeless 0  0 0 0  PHQ - 2 Score 0  0 0 0  Altered sleeping 0 0     Tired, decreased energy 1 0     Change in appetite 0 0     Feeling bad or  failure about yourself  0 0     Trouble concentrating 0 0     Moving slowly or fidgety/restless 0 0     Suicidal thoughts 0 0     PHQ-9 Score 1      Difficult doing work/chores Not difficult at all Not difficult at all         Relevant past medical, surgical, family and social history reviewed and updated as indicated. Interim medical history since our last visit reviewed. Allergies and medications reviewed and updated.  Review of Systems  Constitutional:  Negative for chills and fever.  Eyes:  Negative for visual disturbance.  Respiratory:  Negative for shortness of breath and wheezing.   Cardiovascular:  Negative for chest pain and leg swelling.  Musculoskeletal:  Negative for back pain and gait problem.  Skin:  Negative for rash.  Neurological:  Negative for dizziness and light-headedness.  All other systems reviewed and are negative.   Per HPI unless specifically indicated above   Allergies as of 03/14/2024       Reactions   Flomax  [tamsulosin  Hcl] Other (See Comments)   Made patient hypotensive, clammy, sweaty, and feel like he was going to pass out (  EMS had to be called)   Crestor [rosuvastatin Calcium ] Other (See Comments)   Lethargy and muscle aches   Lipitor [atorvastatin Calcium ] Other (See Comments)   Body cramps, lethargy, and muscle aches        Medication List        Accurate as of March 14, 2024  2:37 PM. If you have any questions, ask your nurse or doctor.          STOP taking these medications    sertraline  50 MG tablet Commonly known as: Zoloft  Stopped by: [name] Zanya Lindo       TAKE these medications    albuterol  (2.5 MG/3ML) 0.083% nebulizer solution Commonly known as: PROVENTIL  Take 3 mLs (2.5 mg total) by nebulization every 4 (four) hours as needed for wheezing or shortness of breath.   aspirin  EC 81 MG tablet Take 1 tablet (81 mg total) by mouth daily with breakfast. Swallow whole.   Breztri  Aerosphere 160-9-4.8 MCG/ACT  Aero inhaler Generic drug: budesonide -glycopyrrolate -formoterol  Inhale 2 puffs into the lungs in the morning and at bedtime.   fluticasone  50 MCG/ACT nasal spray Commonly known as: FLONASE  Place 2 sprays into both nostrils daily.   guaiFENesin  600 MG 12 hr tablet Commonly known as: Mucinex  Take 1 tablet (600 mg total) by mouth 2 (two) times daily.   hydrOXYzine  25 MG capsule Commonly known as: VISTARIL  Take 1 capsule (25 mg total) by mouth every 8 (eight) hours as needed.   isosorbide  mononitrate 30 MG 24 hr tablet Commonly known as: IMDUR  Take 1 tablet (30 mg total) by mouth daily.   levothyroxine  50 MCG tablet Commonly known as: SYNTHROID  Take 1 tablet (50 mcg total) by mouth daily before breakfast.   Nexlizet  180-10 MG Tabs Generic drug: Bempedoic Acid -Ezetimibe  TAKE ONE TABLET DAILY   nicotine  14 mg/24hr patch Commonly known as: Nicoderm CQ  Place 1 patch (14 mg total) onto the skin daily.   nitroGLYCERIN  0.4 MG SL tablet Commonly known as: NITROSTAT  Place 1 tablet (0.4 mg total) under the tongue every 5 (five) minutes as needed for chest pain.   omeprazole  20 MG capsule Commonly known as: PRILOSEC Take 1 capsule (20 mg total) by mouth daily.   OXYGEN Inhale 2 L into the lungs daily.   traZODone  100 MG tablet Commonly known as: DESYREL  Take 1 tablet (100 mg total) by mouth at bedtime.   valsartan  160 MG tablet Commonly known as: DIOVAN  Take 1 tablet (160 mg total) by mouth daily.         Objective:   BP 139/66   Pulse 81   Ht 5' 10 (1.778 m)   Wt 135 lb (61.2 kg)   SpO2 94%   BMI 19.37 kg/m   Wt Readings from Last 3 Encounters:  03/14/24 135 lb (61.2 kg)  02/26/24 136 lb (61.7 kg)  02/11/24 136 lb (61.7 kg)    Physical Exam Physical Exam   VITALS: SaO2- 94% CHEST: Lungs clear to auscultation, no wheezing. CARDIOVASCULAR: Heart regular rate and rhythm, no murmurs.         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 Lung function unlikely to return to baseline, but improvement possible with increased activity. Smoking cessation  may aid lung healing. - Encourage gradual increase in physical activity. - Use oxygen during exertion if saturation drops. - Continue Breztri  and albuterol . - Use albuterol  or breathing treatment before exercise. - Perform regular deep breathing exercises.  Anxiety disorder Anxiety improved since last visit. Previous Zoloft  trial resulted in adverse effects. Current anxiety level stable, no new medication needed. Anxiety likely linked to breathing difficulties. - Return for evaluation if anxiety worsens.  Nicotine  dependence, in remission Nicotine  dependence in remission for nearly seven weeks. No cigarettes smoked since last visit. Retains cigarettes as a reminder but has not used them. - Continue abstaining from smoking. - Consider removing cigarettes from home to reduce temptation.          Follow up plan: Return in about 4 months (around 07/14/2024), or if symptoms worsen or fail to improve, for Regular follow-up and physical exam and recheck chronic medical issues.  Counseling provided for all of the vaccine components No orders of the defined types were placed in this encounter.   Fonda Levins, MD [facility] 03/14/2024, 2:37 PM

## 2024-03-24 ENCOUNTER — Ambulatory Visit (INDEPENDENT_AMBULATORY_CARE_PROVIDER_SITE_OTHER): Admitting: Adult Health

## 2024-03-24 ENCOUNTER — Encounter: Payer: Self-pay | Admitting: Adult Health

## 2024-03-24 ENCOUNTER — Ambulatory Visit (INDEPENDENT_AMBULATORY_CARE_PROVIDER_SITE_OTHER)

## 2024-03-24 VITALS — BP 128/68 | HR 81 | Temp 97.3°F | Ht 70.0 in | Wt 138.6 lb

## 2024-03-24 DIAGNOSIS — J449 Chronic obstructive pulmonary disease, unspecified: Secondary | ICD-10-CM | POA: Diagnosis not present

## 2024-03-24 DIAGNOSIS — J441 Chronic obstructive pulmonary disease with (acute) exacerbation: Secondary | ICD-10-CM | POA: Diagnosis not present

## 2024-03-24 DIAGNOSIS — R5381 Other malaise: Secondary | ICD-10-CM

## 2024-03-24 DIAGNOSIS — R918 Other nonspecific abnormal finding of lung field: Secondary | ICD-10-CM | POA: Diagnosis not present

## 2024-03-24 DIAGNOSIS — J90 Pleural effusion, not elsewhere classified: Secondary | ICD-10-CM | POA: Diagnosis not present

## 2024-03-24 DIAGNOSIS — J189 Pneumonia, unspecified organism: Secondary | ICD-10-CM

## 2024-03-24 DIAGNOSIS — E43 Unspecified severe protein-calorie malnutrition: Secondary | ICD-10-CM

## 2024-03-24 DIAGNOSIS — J9611 Chronic respiratory failure with hypoxia: Secondary | ICD-10-CM | POA: Diagnosis not present

## 2024-03-24 MED ORDER — METHYLPREDNISOLONE ACETATE 80 MG/ML IJ SUSP
80.0000 mg | Freq: Once | INTRAMUSCULAR | Status: AC
  Administered 2024-03-24: 80 mg via INTRAMUSCULAR

## 2024-03-24 MED ORDER — AMOXICILLIN-POT CLAVULANATE 875-125 MG PO TABS: 1.0000 | ORAL_TABLET | Freq: Two times a day (BID) | ORAL | 0 refills | Status: DC

## 2024-03-24 MED ORDER — ALBUTEROL SULFATE (2.5 MG/3ML) 0.083% IN NEBU
2.5000 mg | INHALATION_SOLUTION | Freq: Once | RESPIRATORY_TRACT | Status: AC
  Administered 2024-03-24: 2.5 mg via RESPIRATORY_TRACT

## 2024-03-24 MED ORDER — PREDNISONE 10 MG PO TABS: ORAL_TABLET | ORAL | 0 refills | Status: DC

## 2024-03-24 NOTE — Patient Instructions (Addendum)
 Sputum culture today.  Begin Augmentin  Twice daily  for 10 days, take with food and yogurt  Prednisone  taper over next week.  Continue on Breztri  2 puffs Twice daily, rinse after use .  Increase Albuterol  neb Twice daily  followed by flutter valve. Albuterol  inhaler or neb As needed   CT chest this month as planned  Continue on Oxygen 2l/m -24/7  Great job not smoking  Follow up in 10 days and As needed   Please contact office for sooner follow up if symptoms do not improve or worsen or seek emergency care

## 2024-03-24 NOTE — Progress Notes (Signed)
 @Patient  ID: [name] [name], male    DOB: 02-May-1951, 73 y.o.   MRN: [id number]  Chief Complaint  Patient presents with   COPD    Referring provider: Dettinger, [name], MD  HPI: 73 year old male former smoker followed for COPD and pulmonary nodules, Chronic respiratory failure (started Oxygen 01/2024)    TEST/EVENTS :  LDCT chest October 16, 2023 lung RADS 3 with new left upper lobe nodule measuring 5.5 mm, stable scattered pulmonary nodules  2D echo January 01, 2024 EF 55 to 60%, grade 1 diastolic dysfunction, normal pulmonary artery pressure  Discussed the use of AI scribe software for clinical note transcription with the patient, who gave verbal consent to proceed.  History of Present Illness [name] [name] is a 73 year old male with COPD who presents for a posthospital follow-up.  Patient was admitted 6 weeks ago for COPD exacerbation acute hypoxic respiratory failure and right sided community-acquired pneumonia.  He was treated with IV antibiotics and discharged on Omnicef  and azithromycin  for 3 days along with a prednisone  taper.  Patient did require oxygen at discharge .  Chest x-ray showed increased right upper lobe and right middle lobe interstitial infiltrates .  Patient says he has slowly seen some improvement however over the last week he has had some increased shortness of breath especially in the middle of the night. He has experienced worsening breathing difficulties, particularly at night, waking up at 2:30 or 3:30 AM gasping for breath. He was switched to Breztri , taking two puffs in the morning and two at night, which he feels is working better. He sometimes uses oxygen at night, but not consistently.  Mainly uses oxygen 2 L with activity.  He experiences significant congestion in the mornings, with pain across his chest, a runny nose, and productive cough. He quit smoking after discharge.   He uses a albuterol  nebulizer once or twice a day .  He is physically  active, walking some during the day, and uses oxygen with activity and sometimes at night, but not consistently at bedtime. He is not interested in receiving a flu shot.  He participates in the lung cancer CT screening program, CT chest October 16, 2023 showed decreased right lower lobe nodules, previous scattered pulmonary nodules stable, new left upper lobe nodule 5.5 mm.  Patient was recommended for a 69-month serial CT.  He is scheduled for a CT scan on October 24th.  Appetite is good with no nausea vomiting or diarrhea   Allergies  Allergen Reactions   Flomax  [Tamsulosin  Hcl] Other (See Comments)    Made patient hypotensive, clammy, sweaty, and feel like he was going to pass out (EMS had to be called)   Crestor [Rosuvastatin Calcium ] Other (See Comments)    Lethargy and muscle aches   Lipitor [Atorvastatin Calcium ] Other (See Comments)    Body cramps, lethargy, and muscle aches    Immunization History  Administered Date(s) Administered   Pneumococcal Conjugate-13 01/07/2016   Pneumococcal Polysaccharide-23 06/29/2017    Past Medical History:  Diagnosis Date   Arthritis    Carotid artery stenosis without cerebral infarction    vascular --- dr eliza;   09-24-2007  s/p right CEA   Chronic cholecystitis    w/  large calcified stone in gallbladder   Chronic constipation    COPD (chronic obstructive pulmonary disease) (HCC)    followed by pcp----  (05-28-2021  pt stated last used rescue 05-27-2021 for wheezing resolved after use, stated exacerbation approx  greater  than a year ago)   Coronary artery disease 2002   cardiologist-- dr lavona ;  (05-28-2021  pt denies need to use nitro for CP) per last cath 08-04-2017  LM luminal irregularities, LAD 30% ,D1 ostial 35% ,CFx   AV groove ostial 25% , OM1 long prox 30%,  proxRCA occluded and dominant,   excellent left to right collateral filling.   Depression    Patient denies   DOE (dyspnea on exertion)    due to smoking/ copd    Dyslipidemia    Essential hypertension    Full dentures    GERD (gastroesophageal reflux disease)    History of adenomatous polyp of colon    History of COVID-19    per pt winter 2022, mild to moderate symptoms that resolved   History of diverticulitis of colon    History of kidney stones    Hyperlipidemia    Hypothyroidism    followed by pcp   Lung nodule    s/p bronchoscopy w/ bx in 2005,  benign   Osteoporosis    Smokers' cough (HCC)    05-28-2021  pt stated occasionally productive w/ white phlegm   Wears glasses     Tobacco History: Social History   Tobacco Use  Smoking Status Former   Current packs/day: 1.50   Average packs/day: 1 pack/day for 58.8 years (59.1 ttl pk-yrs)   Types: Cigarettes   Start date: 2025  Smokeless Tobacco Never  Tobacco Comments   Not smoked in 3 weeks   Counseling given: Not Answered Tobacco comments: Not smoked in 3 weeks   Outpatient Medications Prior to Visit  Medication Sig Dispense Refill   albuterol  (PROVENTIL ) (2.5 MG/3ML) 0.083% nebulizer solution Take 3 mLs (2.5 mg total) by nebulization every 4 (four) hours as needed for wheezing or shortness of breath. 150 mL 1   aspirin  EC 81 MG tablet Take 1 tablet (81 mg total) by mouth daily with breakfast. Swallow whole. 30 tablet 5   Bempedoic Acid -Ezetimibe  (NEXLIZET ) 180-10 MG TABS TAKE ONE TABLET DAILY 90 tablet 1   budesonide -glycopyrrolate -formoterol  (BREZTRI  AEROSPHERE) 160-9-4.8 MCG/ACT AERO inhaler Inhale 2 puffs into the lungs in the morning and at bedtime. 10.7 g 11   fluticasone  (FLONASE ) 50 MCG/ACT nasal spray Place 2 sprays into both nostrils daily. 16 g 6   guaiFENesin  (MUCINEX ) 600 MG 12 hr tablet Take 1 tablet (600 mg total) by mouth 2 (two) times daily. 60 tablet 2   hydrOXYzine  (VISTARIL ) 25 MG capsule Take 1 capsule (25 mg total) by mouth every 8 (eight) hours as needed. 30 capsule 2   isosorbide  mononitrate (IMDUR ) 30 MG 24 hr tablet Take 1 tablet (30 mg total) by mouth  daily. 30 tablet 5   levothyroxine  (SYNTHROID ) 50 MCG tablet Take 1 tablet (50 mcg total) by mouth daily before breakfast. 90 tablet 3   nicotine  (NICODERM CQ ) 14 mg/24hr patch Place 1 patch (14 mg total) onto the skin daily. 28 patch 1   nitroGLYCERIN  (NITROSTAT ) 0.4 MG SL tablet Place 1 tablet (0.4 mg total) under the tongue every 5 (five) minutes as needed for chest pain. 25 tablet 3   omeprazole  (PRILOSEC) 20 MG capsule Take 1 capsule (20 mg total) by mouth daily. 30 capsule 5   OXYGEN Inhale 2 L into the lungs daily.     traZODone  (DESYREL ) 100 MG tablet Take 1 tablet (100 mg total) by mouth at bedtime. 90 tablet 1   valsartan  (DIOVAN ) 160 MG tablet Take 1 tablet (160  mg total) by mouth daily. 30 tablet 11   No facility-administered medications prior to visit.     Review of Systems:   Constitutional:   No  weight loss, night sweats,  Fevers, chills, +fatigue, or  lassitude.  HEENT:   No headaches,  Difficulty swallowing,  Tooth/dental problems, or  Sore throat,                No sneezing, itching, ear ache, nasal congestion, post nasal drip, +  CV:  No chest pain,  Orthopnea, PND, swelling in lower extremities, anasarca, dizziness, palpitations, syncope.   GI  No heartburn, indigestion, abdominal pain, nausea, vomiting, diarrhea, change in bowel habits, loss of appetite, bloody stools.   Resp:   No chest wall deformity  Skin: no rash or lesions.  GU: no dysuria, change in color of urine, no urgency or frequency.  No flank pain, no hematuria   MS:  No joint pain or swelling.  No decreased range of motion.  No back pain.    Physical Exam  BP 128/68   Pulse 81   Temp (!) 97.3 F (36.3 C)   Ht 5' 10 (1.778 m)   Wt 138 lb 9.6 oz (62.9 kg)   SpO2 97% Comment: 2l o2 poc  BMI 19.89 kg/m   GEN: A/Ox3; pleasant , NAD, thin, frail, elderly, on oxygen   HEENT:  [place]/AT,  NOSE-clear, THROAT-clear, no lesions, no postnasal drip or exudate noted.   NECK:  Supple w/ fair ROM; no  JVD; normal carotid impulses w/o bruits; no thyromegaly or nodules palpated; no lymphadenopathy.    RESP coarse diffuse rhonchi bilaterally  no accessory muscle use, no dullness to percussion  CARD:  RRR, no m/r/g, no peripheral edema, pulses intact, no cyanosis or clubbing.  GI:   Soft & nt; nml bowel sounds; no organomegaly or masses detected.   Musco: Warm bil, no deformities or joint swelling noted.   Neuro: alert, no focal deficits noted.    Skin: Warm, no lesions or rashes    Lab Results:  CBC    Component Value Date/Time   WBC 4.3 01/30/2024 0442   RBC 4.83 01/30/2024 0442   HGB 14.9 01/30/2024 0442   HGB 15.4 10/02/2023 0839   HCT 43.6 01/30/2024 0442   HCT 46.8 10/02/2023 0839   PLT 265 01/30/2024 0442   PLT 240 10/02/2023 0839   MCV 90.3 01/30/2024 0442   MCV 91 10/02/2023 0839   MCH 30.8 01/30/2024 0442   MCHC 34.2 01/30/2024 0442   RDW 12.4 01/30/2024 0442   RDW 12.4 10/02/2023 0839   LYMPHSABS 2.8 12/31/2023 1029   LYMPHSABS 3.1 10/02/2023 0839   MONOABS 0.9 12/31/2023 1029   EOSABS 0.2 12/31/2023 1029   EOSABS 0.1 10/02/2023 0839   BASOSABS 0.0 12/31/2023 1029   BASOSABS 0.0 10/02/2023 0839    BMET    Component Value Date/Time   NA 141 01/30/2024 0442   NA 137 10/08/2023 1425   K 4.2 01/30/2024 0442   CL 107 01/30/2024 0442   CO2 24 01/30/2024 0442   GLUCOSE 166 (H) 01/30/2024 0442   BUN 16 01/30/2024 0442   BUN 19 10/08/2023 1425   CREATININE 1.07 01/30/2024 0442   CALCIUM  9.8 01/30/2024 0442   GFRNONAA >60 01/30/2024 0442   GFRAA 72 03/09/2020 1033    BNP    Component Value Date/Time   BNP 127.0 (H) 01/29/2024 1726    ProBNP No results found for: PROBNP  Imaging: No results  found.  Administration History     None           No data to display          No results found for: NITRICOXIDE      Assessment & Plan:   Assessment and Plan Assessment & Plan Chronic obstructive pulmonary disease (COPD) with acute  exacerbation-slow to resolve COPD exacerbation is marked by increased wheezing, congestion, and nocturnal dyspnea. Chest x-ray shows right middle lobe opacity ? collapse and dense opacity in the right mid to lower lung, suggesting possible pneumonia.  Concern for possible endobronchial mass with postobstructive collapse is possible.  Will need to follow very closely.  Increase mucociliary clearance with nebulized bronchodilators and flutter valve as this could be a mucoid impaction . Administer albuterol  nebulizer in the office and Depo-Medrol  80 mg injection in office. Prescribe a 10-day course of Augmentin  and begin prednisone  taper. Continue Breztri  inhaler twice daily and increase albuterol  nebulizer use up to twice daily followed by flutter valve. Order sputum culture. Advise use of oxygen 24/7 at two liters to maintain O2 saturation >88-90%. Continue CT chest in three weeks and recommend a pulmonary rehabilitation program when able Patient is aware if symptoms are not improving will need to seek emergency room care as he may need hospital admission as he is high risk for decompensation  Right-sided community-acquired pneumonia-hospitalized August 2025 patient continues to have ongoing progressive changes with acute symptoms.  Will treat with extended course of antibiotics with Augmentin  x 10 days along with aggressive mucociliary clearance. Sputum culture is pending Close follow-up in 10 days with repeat imaging and continue with plan CT chest scheduled on October 24.  Depending on follow-up may need to consider bronchoscopy.   Hypertension   Hypertension management involves a recent switch from lisinopril  to Diovan  due to a cough potentially aggravated by lisinopril . Does feel that cough has improved some after lisinopril  was changed  Chronic respiratory failure-recently started on oxygen at discharge from pneumonia and COPD exacerbation.  Continue on oxygen and maintain O2 saturations greater  than 88 to 90%  Protein calorie malnutrition.  Continue with high-protein diet    Plan  Patient Instructions  Sputum culture today.  Begin Augmentin  Twice daily  for 10 days, take with food and yogurt  Prednisone  taper over next week.  Continue on Breztri  2 puffs Twice daily, rinse after use .  Increase Albuterol  neb Twice daily  followed by flutter valve. Albuterol  inhaler or neb As needed   CT chest this month as planned  Continue on Oxygen 2l/m -24/7  Great job not smoking  Follow up in 10 days and As needed   Please contact office for sooner follow up if symptoms do not improve or worsen or seek emergency care         I spent 46  minutes dedicated to the care of this patient on the date of this encounter to include pre-visit review of records, face-to-face time with the patient discussing conditions above, post visit ordering of testing, clinical documentation with the electronic health record, making appropriate referrals as documented, and communicating necessary findings to members of the patients care team.    Madelin Stank, NP 03/24/2024

## 2024-03-25 DIAGNOSIS — J441 Chronic obstructive pulmonary disease with (acute) exacerbation: Secondary | ICD-10-CM | POA: Diagnosis not present

## 2024-03-30 LAB — RESPIRATORY CULTURE OR RESPIRATORY AND SPUTUM CULTURE
MICRO NUMBER:: 17053779
RESULT:: NORMAL
SPECIMEN QUALITY:: ADEQUATE

## 2024-03-31 ENCOUNTER — Ambulatory Visit: Payer: Self-pay | Admitting: Adult Health

## 2024-04-04 ENCOUNTER — Ambulatory Visit: Admitting: Family Medicine

## 2024-04-05 ENCOUNTER — Encounter: Payer: Self-pay | Admitting: Adult Health

## 2024-04-05 ENCOUNTER — Ambulatory Visit: Admitting: Adult Health

## 2024-04-05 ENCOUNTER — Ambulatory Visit (INDEPENDENT_AMBULATORY_CARE_PROVIDER_SITE_OTHER)

## 2024-04-05 VITALS — BP 120/60 | HR 94 | Ht 70.0 in | Wt 142.8 lb

## 2024-04-05 DIAGNOSIS — J441 Chronic obstructive pulmonary disease with (acute) exacerbation: Secondary | ICD-10-CM

## 2024-04-05 DIAGNOSIS — I700 Atherosclerosis of aorta: Secondary | ICD-10-CM | POA: Diagnosis not present

## 2024-04-05 DIAGNOSIS — J189 Pneumonia, unspecified organism: Secondary | ICD-10-CM | POA: Diagnosis not present

## 2024-04-05 DIAGNOSIS — J9611 Chronic respiratory failure with hypoxia: Secondary | ICD-10-CM

## 2024-04-05 DIAGNOSIS — I771 Stricture of artery: Secondary | ICD-10-CM | POA: Diagnosis not present

## 2024-04-05 DIAGNOSIS — R5381 Other malaise: Secondary | ICD-10-CM

## 2024-04-05 DIAGNOSIS — E43 Unspecified severe protein-calorie malnutrition: Secondary | ICD-10-CM

## 2024-04-05 DIAGNOSIS — J929 Pleural plaque without asbestos: Secondary | ICD-10-CM | POA: Diagnosis not present

## 2024-04-05 DIAGNOSIS — R918 Other nonspecific abnormal finding of lung field: Secondary | ICD-10-CM | POA: Diagnosis not present

## 2024-04-05 NOTE — Progress Notes (Signed)
 @Patient  ID: [name] [name], male    DOB: 02-01-1951, 73 y.o.   MRN: [id number]  No chief complaint on file.   Referring provider: Dettinger, [name], MD  HPI: 73 year old male former smoker followed for COPD, pulmonary nodules, chronic respiratory failure(started on oxygen August 2025)    TEST/EVENTS :  LDCT chest October 16, 2023 lung RADS 3 with new left upper lobe nodule measuring 5.5 mm, stable scattered pulmonary nodules   2D echo January 01, 2024 EF 55 to 60%, grade 1 diastolic dysfunction, normal pulmonary artery pressure  Discussed the use of AI scribe software for clinical note transcription with the patient, who gave verbal consent to proceed.  History of Present Illness      Allergies  Allergen Reactions   Flomax  [Tamsulosin  Hcl] Other (See Comments)    Made patient hypotensive, clammy, sweaty, and feel like he was going to pass out (EMS had to be called)   Crestor [Rosuvastatin Calcium ] Other (See Comments)    Lethargy and muscle aches   Lipitor [Atorvastatin Calcium ] Other (See Comments)    Body cramps, lethargy, and muscle aches    Immunization History  Administered Date(s) Administered   Pneumococcal Conjugate-13 01/07/2016   Pneumococcal Polysaccharide-23 06/29/2017    Past Medical History:  Diagnosis Date   Arthritis    Carotid artery stenosis without cerebral infarction    vascular --- dr eliza;   09-24-2007  s/p right CEA   Chronic cholecystitis    w/  large calcified stone in gallbladder   Chronic constipation    COPD (chronic obstructive pulmonary disease) (HCC)    followed by pcp----  (05-28-2021  pt stated last used rescue 05-27-2021 for wheezing resolved after use, stated exacerbation approx  greater than a year ago)   Coronary artery disease 2002   cardiologist-- dr lavona ;  (05-28-2021  pt denies need to use nitro for CP) per last cath 08-04-2017  LM luminal irregularities, LAD 30% ,D1 ostial 35% ,CFx   AV groove ostial 25% , OM1  long prox 30%,  proxRCA occluded and dominant,   excellent left to right collateral filling.   Depression    Patient denies   DOE (dyspnea on exertion)    due to smoking/ copd   Dyslipidemia    Essential hypertension    Full dentures    GERD (gastroesophageal reflux disease)    History of adenomatous polyp of colon    History of COVID-19    per pt winter 2022, mild to moderate symptoms that resolved   History of diverticulitis of colon    History of kidney stones    Hyperlipidemia    Hypothyroidism    followed by pcp   Lung nodule    s/p bronchoscopy w/ bx in 2005,  benign   Osteoporosis    Smokers' cough (HCC)    05-28-2021  pt stated occasionally productive w/ white phlegm   Wears glasses     Tobacco History: Social History   Tobacco Use  Smoking Status Former   Current packs/day: 1.50   Average packs/day: 1 pack/day for 58.8 years (59.2 ttl pk-yrs)   Types: Cigarettes   Start date: 2025  Smokeless Tobacco Never  Tobacco Comments   Not smoked in 3 weeks   Counseling given: Not Answered Tobacco comments: Not smoked in 3 weeks   Outpatient Medications Prior to Visit  Medication Sig Dispense Refill   albuterol  (PROVENTIL ) (2.5 MG/3ML) 0.083% nebulizer solution Take 3 mLs (2.5 mg total) by nebulization  every 4 (four) hours as needed for wheezing or shortness of breath. 150 mL 1   amoxicillin -clavulanate (AUGMENTIN ) 875-125 MG tablet Take 1 tablet by mouth 2 (two) times daily. 20 tablet 0   aspirin  EC 81 MG tablet Take 1 tablet (81 mg total) by mouth daily with breakfast. Swallow whole. 30 tablet 5   Bempedoic Acid -Ezetimibe  (NEXLIZET ) 180-10 MG TABS TAKE ONE TABLET DAILY 90 tablet 1   budesonide -glycopyrrolate -formoterol  (BREZTRI  AEROSPHERE) 160-9-4.8 MCG/ACT AERO inhaler Inhale 2 puffs into the lungs in the morning and at bedtime. 10.7 g 11   fluticasone  (FLONASE ) 50 MCG/ACT nasal spray Place 2 sprays into both nostrils daily. 16 g 6   guaiFENesin  (MUCINEX ) 600 MG 12  hr tablet Take 1 tablet (600 mg total) by mouth 2 (two) times daily. 60 tablet 2   hydrOXYzine  (VISTARIL ) 25 MG capsule Take 1 capsule (25 mg total) by mouth every 8 (eight) hours as needed. 30 capsule 2   isosorbide  mononitrate (IMDUR ) 30 MG 24 hr tablet Take 1 tablet (30 mg total) by mouth daily. 30 tablet 5   levothyroxine  (SYNTHROID ) 50 MCG tablet Take 1 tablet (50 mcg total) by mouth daily before breakfast. 90 tablet 3   nicotine  (NICODERM CQ ) 14 mg/24hr patch Place 1 patch (14 mg total) onto the skin daily. 28 patch 1   nitroGLYCERIN  (NITROSTAT ) 0.4 MG SL tablet Place 1 tablet (0.4 mg total) under the tongue every 5 (five) minutes as needed for chest pain. 25 tablet 3   omeprazole  (PRILOSEC) 20 MG capsule Take 1 capsule (20 mg total) by mouth daily. 30 capsule 5   OXYGEN Inhale 2 L into the lungs daily.     predniSONE  (DELTASONE ) 10 MG tablet 4 tabs for 2 days, then 3 tabs for 2 days, 2 tabs for 2 days, then 1 tab for 2 days, then stop 20 tablet 0   traZODone  (DESYREL ) 100 MG tablet Take 1 tablet (100 mg total) by mouth at bedtime. 90 tablet 1   valsartan  (DIOVAN ) 160 MG tablet Take 1 tablet (160 mg total) by mouth daily. 30 tablet 11   No facility-administered medications prior to visit.     Review of Systems:   Constitutional:   No  weight loss, night sweats,  Fevers, chills, fatigue, or  lassitude.  HEENT:   No headaches,  Difficulty swallowing,  Tooth/dental problems, or  Sore throat,                No sneezing, itching, ear ache, nasal congestion, post nasal drip,   CV:  No chest pain,  Orthopnea, PND, swelling in lower extremities, anasarca, dizziness, palpitations, syncope.   GI  No heartburn, indigestion, abdominal pain, nausea, vomiting, diarrhea, change in bowel habits, loss of appetite, bloody stools.   Resp: No shortness of breath with exertion or at rest.  No excess mucus, no productive cough,  No non-productive cough,  No coughing up of blood.  No change in color of  mucus.  No wheezing.  No chest wall deformity  Skin: no rash or lesions.  GU: no dysuria, change in color of urine, no urgency or frequency.  No flank pain, no hematuria   MS:  No joint pain or swelling.  No decreased range of motion.  No back pain.    Physical Exam  There were no vitals taken for this visit.  GEN: A/Ox3; pleasant , NAD, well nourished    HEENT:  [place]/AT,  EACs-clear, TMs-wnl, NOSE-clear, THROAT-clear, no lesions, no postnasal drip  or exudate noted.   NECK:  Supple w/ fair ROM; no JVD; normal carotid impulses w/o bruits; no thyromegaly or nodules palpated; no lymphadenopathy.    RESP  Clear  P & A; w/o, wheezes/ rales/ or rhonchi. no accessory muscle use, no dullness to percussion  CARD:  RRR, no m/r/g, no peripheral edema, pulses intact, no cyanosis or clubbing.  GI:   Soft & nt; nml bowel sounds; no organomegaly or masses detected.   Musco: Warm bil, no deformities or joint swelling noted.   Neuro: alert, no focal deficits noted.    Skin: Warm, no lesions or rashes    Lab Results:  CBC    Component Value Date/Time   WBC 4.3 01/30/2024 0442   RBC 4.83 01/30/2024 0442   HGB 14.9 01/30/2024 0442   HGB 15.4 10/02/2023 0839   HCT 43.6 01/30/2024 0442   HCT 46.8 10/02/2023 0839   PLT 265 01/30/2024 0442   PLT 240 10/02/2023 0839   MCV 90.3 01/30/2024 0442   MCV 91 10/02/2023 0839   MCH 30.8 01/30/2024 0442   MCHC 34.2 01/30/2024 0442   RDW 12.4 01/30/2024 0442   RDW 12.4 10/02/2023 0839   LYMPHSABS 2.8 12/31/2023 1029   LYMPHSABS 3.1 10/02/2023 0839   MONOABS 0.9 12/31/2023 1029   EOSABS 0.2 12/31/2023 1029   EOSABS 0.1 10/02/2023 0839   BASOSABS 0.0 12/31/2023 1029   BASOSABS 0.0 10/02/2023 0839    BMET    Component Value Date/Time   NA 141 01/30/2024 0442   NA 137 10/08/2023 1425   K 4.2 01/30/2024 0442   CL 107 01/30/2024 0442   CO2 24 01/30/2024 0442   GLUCOSE 166 (H) 01/30/2024 0442   BUN 16 01/30/2024 0442   BUN 19 10/08/2023  1425   CREATININE 1.07 01/30/2024 0442   CALCIUM  9.8 01/30/2024 0442   GFRNONAA >60 01/30/2024 0442   GFRAA 72 03/09/2020 1033    BNP    Component Value Date/Time   BNP 127.0 (H) 01/29/2024 1726    ProBNP No results found for: PROBNP  Imaging: DG Chest 2 View Result Date: 03/24/2024 CLINICAL DATA:  COPD exacerbation. EXAM: CHEST - 2 VIEW COMPARISON:  January 29, 2024. FINDINGS: The heart size and mediastinal contours are within normal limits. Right middle lobe airspace opacity is noted concerning for pneumonia or atelectasis. Minimal right pleural effusion is noted. Hyperinflation of the lungs is noted. The visualized skeletal structures are unremarkable. IMPRESSION: Right middle lobe airspace opacity is noted most consistent with atelectasis, although potentially may represent pneumonia. Endobronchial obstruction cannot be excluded. Minimal right pleural effusion. Electronically Signed   By: Lynwood Landy Raddle M.D.   On: 03/24/2024 10:38    albuterol  (PROVENTIL ) (2.5 MG/3ML) 0.083% nebulizer solution 2.5 mg     Date Action Dose Route User   03/24/2024 1013 Given 2.5 mg Nebulization Busick, Ashlyn T, CMA      methylPREDNISolone  acetate (DEPO-MEDROL ) injection 80 mg     Date Action Dose Route User   03/24/2024 1055 Given 80 mg Intramuscular (Left Ventrogluteal) Busick, Ashlyn T, CMA           No data to display          No results found for: NITRICOXIDE      Assessment & Plan:   Assessment and Plan Assessment & Plan        I spent 42  minutes dedicated to the care of this patient on the date of this encounter to include pre-visit  review of records, face-to-face time with the patient discussing conditions above, post visit ordering of testing, clinical documentation with the electronic health record, making appropriate referrals as documented, and communicating necessary findings to members of the patients care team.   Madelin Stank, NP 04/05/2024

## 2024-04-05 NOTE — Patient Instructions (Addendum)
 Continue on Breztri  2 puffs Twice daily, rinse after use .  Continue on Albuterol  neb Twice daily  followed by flutter valve. Albuterol  inhaler or neb As needed   CT chest this month as planned  Continue on Oxygen 2l/m -24/7  Great job not smoking  Follow up in 4 weeks with Dr. Shelah  or Alazia Crocket NP and As needed   Please contact office for sooner follow up if symptoms do not improve or worsen or seek emergency care

## 2024-04-06 DIAGNOSIS — J441 Chronic obstructive pulmonary disease with (acute) exacerbation: Secondary | ICD-10-CM | POA: Diagnosis not present

## 2024-04-11 ENCOUNTER — Ambulatory Visit: Payer: Self-pay | Admitting: Adult Health

## 2024-04-15 ENCOUNTER — Ambulatory Visit (HOSPITAL_COMMUNITY)
Admission: RE | Admit: 2024-04-15 | Discharge: 2024-04-15 | Disposition: A | Discharge status: Home / Self Care | Source: Ambulatory Visit | Attending: Acute Care | Admitting: Acute Care

## 2024-04-15 DIAGNOSIS — R918 Other nonspecific abnormal finding of lung field: Secondary | ICD-10-CM | POA: Diagnosis not present

## 2024-04-15 DIAGNOSIS — Z87891 Personal history of nicotine dependence: Secondary | ICD-10-CM | POA: Insufficient documentation

## 2024-04-15 DIAGNOSIS — R911 Solitary pulmonary nodule: Secondary | ICD-10-CM | POA: Diagnosis not present

## 2024-04-15 DIAGNOSIS — Z122 Encounter for screening for malignant neoplasm of respiratory organs: Secondary | ICD-10-CM | POA: Diagnosis not present

## 2024-04-15 DIAGNOSIS — J90 Pleural effusion, not elsewhere classified: Secondary | ICD-10-CM | POA: Diagnosis not present

## 2024-04-15 DIAGNOSIS — J9811 Atelectasis: Secondary | ICD-10-CM | POA: Diagnosis not present

## 2024-04-21 ENCOUNTER — Encounter (HOSPITAL_COMMUNITY): Payer: Self-pay | Admitting: Emergency Medicine

## 2024-04-21 ENCOUNTER — Telehealth: Payer: Self-pay | Admitting: Acute Care

## 2024-04-21 ENCOUNTER — Other Ambulatory Visit: Payer: Self-pay | Admitting: Acute Care

## 2024-04-21 ENCOUNTER — Encounter: Payer: Self-pay | Admitting: Emergency Medicine

## 2024-04-21 ENCOUNTER — Other Ambulatory Visit: Payer: Self-pay

## 2024-04-21 ENCOUNTER — Telehealth: Payer: Self-pay

## 2024-04-21 ENCOUNTER — Encounter: Payer: Self-pay | Admitting: Adult Health

## 2024-04-21 ENCOUNTER — Ambulatory Visit: Admitting: Adult Health

## 2024-04-21 VITALS — BP 102/58 | HR 94 | Ht 70.0 in | Wt 141.6 lb

## 2024-04-21 DIAGNOSIS — R5381 Other malaise: Secondary | ICD-10-CM | POA: Diagnosis not present

## 2024-04-21 DIAGNOSIS — J9611 Chronic respiratory failure with hypoxia: Secondary | ICD-10-CM | POA: Diagnosis not present

## 2024-04-21 DIAGNOSIS — J441 Chronic obstructive pulmonary disease with (acute) exacerbation: Secondary | ICD-10-CM | POA: Diagnosis not present

## 2024-04-21 DIAGNOSIS — R918 Other nonspecific abnormal finding of lung field: Secondary | ICD-10-CM | POA: Insufficient documentation

## 2024-04-21 NOTE — Telephone Encounter (Signed)
 Patient's been scheduled. Waiting for Patient to arrive for OV. Routing to [employer] for [employer].

## 2024-04-21 NOTE — H&P (View-Only) (Signed)
 @Patient  ID: [name] [name], male    DOB: [birth year], 73 y.o.   MRN: [id number]  Chief Complaint  Patient presents with   Follow-up    COPD and bronch    Referring provider: Dettinger, [name], MD  HPI: 73 year old male former smoker followed for COPD, pulmonary nodules, chronic respiratory failure (started on oxygen August 2025)    TEST/EVENTS : Reviewed 04/21/2024  LDCT chest October 16, 2023 lung RADS 3 with new left upper lobe nodule measuring 5.5 mm, stable scattered pulmonary nodules   2D echo January 01, 2024 EF 55 to 60%, grade 1 diastolic dysfunction, normal pulmonary artery pressure  Discussed the use of AI scribe software for clinical note transcription with the patient, who gave verbal consent to proceed.  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  Patient was hospitalized in August for COPD exacerbation complicated by acute hypoxic respiratory failure and right sided pneumonia.  He was treated with IV antibiotics and steroids.  Started on oxygen.  Patient has been seen in the office for ongoing symptoms of shortness of breath cough and congestion.  Earlier this month chest x-ray showed ongoing dense right middle lobe airspace opacity and small right pleural effusion.  He was treated with a 10-day course of Augmentin  and prednisone  taper started on a flutter valve along with his albuterol  nebulizer treatments.  Patient was recommended on smoking cessation and has not smoked for several weeks.  Sputum culture showed normal oropharyngeal flora.  He participates in the lung cancer CT screening program.  CT chest October 16, 2023 showed previously noted new nodules had decreased in size and near completely resolved, new LUL nodule, increased LLL nodule and RLL nodule. CT chest 04/15/24 shows severe emphysema.  New complete right  middle lobe atelectasis and irregular consolidation in the right upper lobe concerning for postobstructive pneumonia and a new right hilar mass measuring 5.8 cm, new 10 mm subcarinal lymph node suspicious for nodal metastasis.   He continues to experience a significant cough and is producing a large amount of mucus. Despite treatments aimed at clearing the mucus, the issue persists.  He remains on Breztri  twice daily.  Using albuterol  nebulizer twice daily followed by flutter valve.  Mornings are particularly difficult, and he becomes short of breath after minimal exertion, such as climbing seven steps, necessitating a rest to catch his breath.  Has had some blood tinged mucus on occasion.  He is currently on oxygen therapy 2 L. there has been no increase in oxygen needs, and he has quit smoking. He occasionally takes aspirin  but not daily.  Is not on Plavix or anticoagulation therapy.  Says that he has had anesthesia in the past with no known difficulties.     Allergies  Allergen Reactions   Flomax  [Tamsulosin  Hcl] Other (See Comments)    Made patient hypotensive, clammy, sweaty, and feel like he was going to pass out (EMS had to be called)   Crestor [Rosuvastatin Calcium ] Other (See Comments)    Lethargy and muscle aches   Lipitor [Atorvastatin Calcium ] Other (See Comments)    Body cramps, lethargy, and muscle aches    Immunization History  Administered Date(s) Administered   Pneumococcal Conjugate-13 01/07/2016   Pneumococcal Polysaccharide-23 06/29/2017    Past Medical History:  Diagnosis Date   Arthritis    Carotid artery stenosis without  cerebral infarction    vascular --- dr eliza;   09-24-2007  s/p right CEA   Chronic cholecystitis    w/  large calcified stone in gallbladder   Chronic constipation    COPD (chronic obstructive pulmonary disease) (HCC)    followed by pcp----  (05-28-2021  pt stated last used rescue 05-27-2021 for wheezing resolved after use, stated  exacerbation approx  greater than a year ago)   Coronary artery disease 2002   cardiologist-- dr lavona ;  (05-28-2021  pt denies need to use nitro for CP) per last cath 08-04-2017  LM luminal irregularities, LAD 30% ,D1 ostial 35% ,CFx   AV groove ostial 25% , OM1 long prox 30%,  proxRCA occluded and dominant,   excellent left to right collateral filling.   Depression    Patient denies   DOE (dyspnea on exertion)    due to smoking/ copd   Dyslipidemia    Essential hypertension    Full dentures    GERD (gastroesophageal reflux disease)    History of adenomatous polyp of colon    History of COVID-19    per pt winter 2022, mild to moderate symptoms that resolved   History of diverticulitis of colon    History of kidney stones    Hyperlipidemia    Hypothyroidism    followed by pcp   Lung nodule    s/p bronchoscopy w/ bx in 2005,  benign   Osteoporosis    Smokers' cough (HCC)    05-28-2021  pt stated occasionally productive w/ white phlegm   Wears glasses     Tobacco History: Social History   Tobacco Use  Smoking Status Former   Current packs/day: 1.50   Average packs/day: 1 pack/day for 58.8 years (59.2 ttl pk-yrs)   Types: Cigarettes   Start date: 2025  Smokeless Tobacco Never  Tobacco Comments   Quit smoking mid September 2025   Counseling given: Not Answered Tobacco comments: Quit smoking mid September 2025   Outpatient Medications Prior to Visit  Medication Sig Dispense Refill   albuterol  (PROVENTIL ) (2.5 MG/3ML) 0.083% nebulizer solution Take 3 mLs (2.5 mg total) by nebulization every 4 (four) hours as needed for wheezing or shortness of breath. 150 mL 1   aspirin  EC 81 MG tablet Take 1 tablet (81 mg total) by mouth daily with breakfast. Swallow whole. 30 tablet 5   Bempedoic Acid -Ezetimibe  (NEXLIZET ) 180-10 MG TABS TAKE ONE TABLET DAILY 90 tablet 1   budesonide -glycopyrrolate -formoterol  (BREZTRI  AEROSPHERE) 160-9-4.8 MCG/ACT AERO inhaler Inhale 2 puffs into the  lungs in the morning and at bedtime. 10.7 g 11   fluticasone  (FLONASE ) 50 MCG/ACT nasal spray Place 2 sprays into both nostrils daily. 16 g 6   hydrOXYzine  (VISTARIL ) 25 MG capsule Take 1 capsule (25 mg total) by mouth every 8 (eight) hours as needed. 30 capsule 2   levothyroxine  (SYNTHROID ) 50 MCG tablet Take 1 tablet (50 mcg total) by mouth daily before breakfast. 90 tablet 3   nitroGLYCERIN  (NITROSTAT ) 0.4 MG SL tablet Place 1 tablet (0.4 mg total) under the tongue every 5 (five) minutes as needed for chest pain. 25 tablet 3   omeprazole  (PRILOSEC) 20 MG capsule Take 1 capsule (20 mg total) by mouth daily. 30 capsule 5   OXYGEN Inhale 2 L into the lungs daily.     traZODone  (DESYREL ) 100 MG tablet Take 1 tablet (100 mg total) by mouth at bedtime. 90 tablet 1   valsartan  (DIOVAN ) 160 MG tablet Take 1 tablet (  160 mg total) by mouth daily. 30 tablet 11   amoxicillin -clavulanate (AUGMENTIN ) 875-125 MG tablet Take 1 tablet by mouth 2 (two) times daily. (Patient not taking: Reported on 04/21/2024) 20 tablet 0   guaiFENesin  (MUCINEX ) 600 MG 12 hr tablet Take 1 tablet (600 mg total) by mouth 2 (two) times daily. (Patient not taking: Reported on 04/21/2024) 60 tablet 2   isosorbide  mononitrate (IMDUR ) 30 MG 24 hr tablet Take 1 tablet (30 mg total) by mouth daily. (Patient not taking: Reported on 04/21/2024) 30 tablet 5   nicotine  (NICODERM CQ ) 14 mg/24hr patch Place 1 patch (14 mg total) onto the skin daily. (Patient not taking: Reported on 04/21/2024) 28 patch 1   predniSONE  (DELTASONE ) 10 MG tablet 4 tabs for 2 days, then 3 tabs for 2 days, 2 tabs for 2 days, then 1 tab for 2 days, then stop (Patient not taking: Reported on 04/21/2024) 20 tablet 0   No facility-administered medications prior to visit.     Review of Systems:   Constitutional:   No  weight loss, night sweats,  Fevers, chills, +fatigue, or  lassitude.  HEENT:   No headaches,  Difficulty swallowing,  Tooth/dental problems, or  Sore  throat,                No sneezing, itching, ear ache, nasal congestion, post nasal drip,   CV:  No chest pain,  Orthopnea, PND, swelling in lower extremities, anasarca, dizziness, palpitations, syncope.   GI  No heartburn, indigestion, abdominal pain, nausea, vomiting, diarrhea, change in bowel habits, loss of appetite, bloody stools.   Resp: .  No chest wall deformity  Skin: no rash or lesions.  GU: no dysuria, change in color of urine, no urgency or frequency.  No flank pain, no hematuria   MS:  No joint pain or swelling.  No decreased range of motion.  No back pain.    Physical Exam  BP (!) 102/58   Pulse 94   Ht 5' 10 (1.778 m) Comment: per pt  Wt 141 lb 9.6 oz (64.2 kg)   SpO2 97% Comment: 2L pulse o2  BMI 20.32 kg/m   GEN: A/Ox3; pleasant , NAD, frail, elderly   HEENT:  [place]/AT,  EACs-clear, TMs-wnl, NOSE-clear, THROAT-clear, no lesions, no postnasal drip or exudate noted.   NECK:  Supple w/ fair ROM; no JVD; normal carotid impulses w/o bruits; no thyromegaly or nodules palpated; no lymphadenopathy.    RESP  Clear  P & A; w/o, wheezes/ rales/ or rhonchi. no accessory muscle use, no dullness to percussion  CARD:  RRR, no m/r/g, no peripheral edema, pulses intact, no cyanosis or clubbing.  GI:   Soft & nt; nml bowel sounds; no organomegaly or masses detected.   Musco: Warm bil, no deformities or joint swelling noted.   Neuro: alert, no focal deficits noted.    Skin: Warm, no lesions or rashes    Lab Results:Reviewed 04/21/2024   CBC    Component Value Date/Time   WBC 4.3 01/30/2024 0442   RBC 4.83 01/30/2024 0442   HGB 14.9 01/30/2024 0442   HGB 15.4 10/02/2023 0839   HCT 43.6 01/30/2024 0442   HCT 46.8 10/02/2023 0839   PLT 265 01/30/2024 0442   PLT 240 10/02/2023 0839   MCV 90.3 01/30/2024 0442   MCV 91 10/02/2023 0839   MCH 30.8 01/30/2024 0442   MCHC 34.2 01/30/2024 0442   RDW 12.4 01/30/2024 0442   RDW 12.4 10/02/2023 0839  LYMPHSABS 2.8  12/31/2023 1029   LYMPHSABS 3.1 10/02/2023 0839   MONOABS 0.9 12/31/2023 1029   EOSABS 0.2 12/31/2023 1029   EOSABS 0.1 10/02/2023 0839   BASOSABS 0.0 12/31/2023 1029   BASOSABS 0.0 10/02/2023 0839    BMET    Component Value Date/Time   NA 141 01/30/2024 0442   NA 137 10/08/2023 1425   K 4.2 01/30/2024 0442   CL 107 01/30/2024 0442   CO2 24 01/30/2024 0442   GLUCOSE 166 (H) 01/30/2024 0442   BUN 16 01/30/2024 0442   BUN 19 10/08/2023 1425   CREATININE 1.07 01/30/2024 0442   CALCIUM  9.8 01/30/2024 0442   GFRNONAA >60 01/30/2024 0442   GFRAA 72 03/09/2020 1033    BNP    Component Value Date/Time   BNP 127.0 (H) 01/29/2024 1726    ProBNP No results found for: PROBNP  Imaging: CT CHEST LCS NODULE F/U LOW DOSE WO CONTRAST Addendum Date: 04/21/2024 ADDENDUM REPORT: 04/21/2024 11:36 ADDENDUM: These results will be called to the ordering clinician or representative by the Radiologist Assistant, and communication documented in the PACS or [employer]. Electronically Signed   By: Limin  Xu M.D.   On: 04/21/2024 11:36   Result Date: 04/21/2024 CLINICAL DATA:  Current smoker with 89 pack-year smoking history. Follow-up Lung-RADS 3 EXAM: CT CHEST WITHOUT CONTRAST FOR LUNG CANCER SCREENING NODULE FOLLOW-UP TECHNIQUE: Multidetector CT imaging of the chest was performed following the standard protocol without IV contrast. RADIATION DOSE REDUCTION: This exam was performed according to the departmental dose-optimization program which includes automated exposure control, adjustment of the mA and/or kV according to patient size and/or use of iterative reconstruction technique. COMPARISON:  CT chest dated 10/16/2023 FINDINGS: Cardiovascular: Normal heart size. No significant pericardial fluid/thickening. Great vessels are normal in course and caliber. Coronary artery calcifications and aortic atherosclerosis. Mediastinum/Nodes: Imaged thyroid  gland without nodules meeting criteria for  imaging follow-up by size. Normal esophagus. New 10 mm subcarinal lymph node (2:35). Unchanged 10 mm precarinal lymph node (2:27). Partially calcified right hilar node is again seen, inseparable from the hilar mass described below. Lungs/Pleura: The central airways are patent. Severe paraseptal and centrilobular emphysema. Increased diffuse interlobular septal thickening and moderate diffuse bronchial wall thickening. New complete right middle lobe atelectasis and irregular consolidation in the anteroinferior right upper lobe. Interval development of right hilar mass measuring 5.8 x 4.1 cm (2:36). No pneumothorax. Small right pleural effusion. Upper abdomen: Cholecystectomy.  Calcified splenic granulomata. Musculoskeletal: No acute or abnormal lytic or blastic osseous lesions. IMPRESSION: 1. Lung-RADS 4X, highly suspicious. Additional imaging evaluation or consultation with Pulmonology or Thoracic Surgery recommended. Interval development of right hilar mass measuring 5.8 x 4.1 cm, highly suspicious for primary lung malignancy. 2. New 10 mm subcarinal lymph node, suspicious for nodal metastasis. 3. New complete right middle lobe atelectasis and irregular consolidation in the anteroinferior right upper lobe, which may represent postobstructive atelectasis/pneumonia. 4. Increased diffuse interlobular septal thickening and moderate diffuse bronchial wall thickening, which may represent pulmonary edema. 5. Small right pleural effusion. 6. Aortic Atherosclerosis (ICD10-I70.0) and Emphysema (ICD10-J43.9). Coronary artery calcifications. Assessment for potential risk factor modification, dietary therapy or pharmacologic therapy may be warranted, if clinically indicated. Electronically Signed: By: Limin  Xu M.D. On: 04/21/2024 10:54   DG Chest 2 View Result Date: 04/05/2024 CLINICAL DATA:  pneumonia EXAM: CHEST - 2 VIEW COMPARISON:  03/24/2024 FINDINGS: Similar dense consolidation of the right middle lobe. Trace right  pleural effusion. No pneumothorax. Biapical pleural thickening. No cardiomegaly. Tortuous  aorta with aortic atherosclerosis. No acute fracture or destructive lesions. Multilevel thoracic osteophytosis. IMPRESSION: Similar dense consolidation of the right middle lobe, possibly representing pneumonia or atelectasis. Nonemergent chest CT with IV contrast recommended to exclude a central obstruction. Electronically Signed   By: Rogelia Myers M.D.   On: 04/05/2024 12:43   DG Chest 2 View Result Date: 03/24/2024 CLINICAL DATA:  COPD exacerbation. EXAM: CHEST - 2 VIEW COMPARISON:  January 29, 2024. FINDINGS: The heart size and mediastinal contours are within normal limits. Right middle lobe airspace opacity is noted concerning for pneumonia or atelectasis. Minimal right pleural effusion is noted. Hyperinflation of the lungs is noted. The visualized skeletal structures are unremarkable. IMPRESSION: Right middle lobe airspace opacity is noted most consistent with atelectasis, although potentially may represent pneumonia. Endobronchial obstruction cannot be excluded. Minimal right pleural effusion. Electronically Signed   By: Lynwood Landy Raddle M.D.   On: 03/24/2024 10:38    albuterol  (PROVENTIL ) (2.5 MG/3ML) 0.083% nebulizer solution 2.5 mg     Date Action Dose Route User   03/24/2024 1013 Given 2.5 mg Nebulization Busick, Ashlyn T, CMA      methylPREDNISolone  acetate (DEPO-MEDROL ) injection 80 mg     Date Action Dose Route User   03/24/2024 1055 Given 80 mg Intramuscular (Left Ventrogluteal) Busick, Ashlyn T, CMA           No data to display          No results found for: NITRICOXIDE      No data to display              Assessment & Plan:   Assessment and Plan Assessment & Plan Right lung mass with lymphadenopathy concerning for underlying malignancy with a postobstructive pneumonia  Persistent right lung consolidation with enlarged lymph nodes raises concern for malignancy.  We  reviewed his CT results in detail.  Went over need for additional testing with navigational bronchoscopy with endobronchial ultrasound biopsy with BAL, cultures, cytology/pathology.  We went over procedure and potential risk of anesthesia and complications of biopsy such as infection, bleeding, pneumothorax, he has been scheduled for November 3  Monday at 7:30 AM . Hold aspirin  for two days prior. No food intake after midnight before the procedure. Arrange transportation home post-procedure.  Most likely will need PET scan going forward..   Chronic obstructive pulmonary disease (COPD) with emphysema  -suspect patient has underlying severe COPD with emphysema.  Patient has not had PFTs.  Going forward we will set patient up for pulmonary function testing when able  Chronic productive cough with occasional hemoptysis   Chronic productive cough with occasional hemoptysis. Recent hemoptysis episode on Sunday, not ongoing. Likely related to underlying lung pathology.  Chronic hypoxemia requiring supplemental oxygen   Chronic hypoxemia managed with supplemental oxygen. No recent increase in oxygen requirements. Continue current oxygen therapy.  Tobacco use, currently abstinent   Currently abstinent from tobacco use. Continued abstinence is crucial for lung health. Encourage continued abstinence from smoking.  Pulmonary preop risk assessment.-Patient is fully independent.  However suspect he has underlying at least moderate to severe COPD with emphysema that is now oxygen dependent.  Patient would be considered a moderate surgical risk from a pulmonary standpoint.  Currently stable with his COPD and oxygen requirements.. We reviewed this in detail.  Major Pulmonary risks identified in the multifactorial risk analysis are but not limited to a) pneumonia; b) recurrent intubation risk; c) prolonged or recurrent acute respiratory failure needing mechanical ventilation; d) prolonged  hospitalization; e)  DVT/Pulmonary embolism; f) Acute Pulmonary edema     Plan  Patient Instructions  You are set up for Navigational Bronchoscopy on 04/25/24.  Continue on Breztri  2 puffs Twice daily, rinse after use .  Continue on Albuterol  neb Twice daily  followed by flutter valve. Albuterol  inhaler or neb As needed   Continue on Oxygen 2l/m -24/7  Great job not smoking  Follow up in 2 weeks with Dr. Shelah  or Denecia Brunette NP and As needed   Please contact office for sooner follow up if symptoms do not improve or worsen or seek emergency care      Madelin Stank, NP 04/21/2024

## 2024-04-21 NOTE — Telephone Encounter (Signed)
 Results given to Groce, NP for review.

## 2024-04-21 NOTE — Pre-Procedure Instructions (Signed)
-------------    SDW INSTRUCTIONS given:  Your procedure is scheduled on 11/3.  Report to [facility] Main Entrance A at 05:30 A.M., and check in at the Admitting office.  Any questions or running late day of surgery: call [phone]    Remember:  Do not eat or drink after midnight the night before your surgery     Take these medicines the morning of surgery with A SIP OF WATER  nexlizet , breztri , flonase , levothyroxine , omeprazole       May take these medicines IF NEEDED: albuterol  neb, hydroxyzine , nitroglycerin - If you have to take this medication prior to surgery, please call [phone] and report this to a nurse   Hold Aspirin  2 days prior. Last dose 10/31  As of today, STOP taking any Aleve , Naproxen , Ibuprofen , Motrin , Advil , Goody's, BC's, all herbal medications, fish oil, and all vitamins.   Do NOT Smoke (Tobacco/Vaping) 24 hours prior to your procedure  If you use a CPAP at night, you may bring all equipment for your overnight stay.     You will be asked to remove any contacts, glasses, piercing's, hearing aid's, dentures/partials prior to surgery. Please bring cases for these items if needed.     Patients discharged the day of surgery will not be allowed to drive home, and someone needs to stay with them for 24 hours.  SURGICAL WAITING ROOM VISITATION Patients may have no more than 2 support people in the waiting area - these visitors may rotate.   Pre-op nurse will coordinate an appropriate time for 1 ADULT support person, who may not rotate, to accompany patient in pre-op.  Children under the age of 27 must have an adult with them who is not the patient and must remain in the main waiting area with an adult.  If the patient needs to stay at the hospital during part of their recovery, the visitor guidelines for inpatient rooms apply.  Please refer to the [facility] website for the visitor guidelines for any additional information.   Special instructions:    [facility]- Preparing For Surgery   Please follow these instructions carefully.   Shower the NIGHT BEFORE SURGERY and the MORNING OF SURGERY with DIAL Soap.   Pat yourself dry with a CLEAN TOWEL.  Wear CLEAN PAJAMAS to bed the night before surgery  Place CLEAN SHEETS on your bed the night of your first shower and DO NOT SLEEP WITH PETS.   Additional instructions for the day of surgery: DO NOT APPLY any lotions, deodorants, cologne, or perfumes.   Do not wear jewelry or makeup Do not wear nail polish, gel polish, artificial nails, or any other type of covering on natural nails (fingers and toes) Do not bring valuables to the hospital. [facility] is not responsible for valuables/personal belongings. Put on clean/comfortable clothes.  Please brush your teeth.  Ask your nurse before applying any prescription medications to the skin.

## 2024-04-21 NOTE — Telephone Encounter (Signed)
 Call report from radiology regarding a 4X Lung Cancer Screening scan. This patient's scan was read as a LR 3 10/16/2023.  6 month follow up scan was read as a 4 X as there has been interval development of a right hilar mass measuring 5.8 x 4.1 cm. There is also new complete right middle lobe atelectasis and irregular consolidation in the anterioinferior right upper lobe. There is also a small right pleural effusion.  We called the patient to see if we could get him seen as soon as possible, and discuss the results of his scan. He told us  he  has an appointment today with Madelin Stank NP, as follow up for worsening dyspnea. He needs bronchoscopy with biopsy asap, and PET scan after to stage if this is a cancer. Once Madelin Stank NP assesses him, if she feels he can safely undergo general anesthesia for bronchoscopy with biopsies, we will see if we can get this scheduled for 10/31 bronchoscopy with biopsies, as I believe there is still one opening with Dr. Shelah.   I have spoken with Madelin Stank NP. She will risk assess him for candidacy for general anesthesia for bronchoscopy with biopsies, and discuss risks of the procedure to ensure he is willing to proceed.

## 2024-04-21 NOTE — Telephone Encounter (Signed)
 Call Report from Tiffany  IMPRESSION: 1. Lung-RADS 4X, highly suspicious. Additional imaging evaluation or consultation with Pulmonology or Thoracic Surgery recommended. Interval development of right hilar mass measuring 5.8 x 4.1 cm, highly suspicious for primary lung malignancy. 2. New 10 mm subcarinal lymph node, suspicious for nodal metastasis. 3. New complete right middle lobe atelectasis and irregular consolidation in the anteroinferior right upper lobe, which may represent postobstructive atelectasis/pneumonia. 4. Increased diffuse interlobular septal thickening and moderate diffuse bronchial wall thickening, which may represent pulmonary edema. 5. Small right pleural effusion. 6. Aortic Atherosclerosis (ICD10-I70.0) and Emphysema (ICD10-J43.9). Coronary artery calcifications. Assessment for potential risk factor modification, dietary therapy or pharmacologic therapy may be warranted, if clinically indicated.

## 2024-04-21 NOTE — Telephone Encounter (Signed)
 See provider note 04/21/2024

## 2024-04-21 NOTE — Telephone Encounter (Signed)
 Please schedule the following:  Provider performing procedure: [place] Diagnosis: Right hilar mass, post obstructive pneumonia, lymphadenopathy Which side if for nodule / mass?  Right Procedure:  Navigational bronchoscopy with biopsies and EBUS  Has patient been spoken to by Provider and given informed consent? Madelin Stank NP on 04/21/2024, and Lauraine Lites NP 04/21/2024 Anesthesia:  General Do you need Fluro?  Yes Duration of procedure:  1.5 hours Date: 04/25/2024 Alternate Date:   Time:  7:30 am case, last available Location:  [facility] Does patient have OSA?  No DM?  No  Or Latex allergy?  No Medication Restriction/ Anticoagulate/Antiplatelet: No blood thinners, no aspirin  x 2 days before procedure Pre-op Labs Ordered:determined by Anesthesia Imaging request:  Recent LDCT should work  (If, SuperDimension CT Chest, please have STAT courier sent to ENDO)  Pt. Does take Synthroid .   Pt. Has an appointment at 4 pm today with Madelin Stank NP. Please make sure he gets paperwork today.Thanks

## 2024-04-21 NOTE — Progress Notes (Signed)
 @Patient  ID: [name] [name], male    DOB: [birth year], 73 y.o.   MRN: [id number]  Chief Complaint  Patient presents with   Follow-up    COPD and bronch    Referring provider: Dettinger, [name], MD  HPI: 73 year old male former smoker followed for COPD, pulmonary nodules, chronic respiratory failure (started on oxygen August 2025)    TEST/EVENTS : Reviewed 04/21/2024  LDCT chest October 16, 2023 lung RADS 3 with new left upper lobe nodule measuring 5.5 mm, stable scattered pulmonary nodules   2D echo January 01, 2024 EF 55 to 60%, grade 1 diastolic dysfunction, normal pulmonary artery pressure  Discussed the use of AI scribe software for clinical note transcription with the patient, who gave verbal consent to proceed.  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  Patient was hospitalized in August for COPD exacerbation complicated by acute hypoxic respiratory failure and right sided pneumonia.  He was treated with IV antibiotics and steroids.  Started on oxygen.  Patient has been seen in the office for ongoing symptoms of shortness of breath cough and congestion.  Earlier this month chest x-ray showed ongoing dense right middle lobe airspace opacity and small right pleural effusion.  He was treated with a 10-day course of Augmentin  and prednisone  taper started on a flutter valve along with his albuterol  nebulizer treatments.  Patient was recommended on smoking cessation and has not smoked for several weeks.  Sputum culture showed normal oropharyngeal flora.  He participates in the lung cancer CT screening program.  CT chest October 16, 2023 showed previously noted new nodules had decreased in size and near completely resolved, new LUL nodule, increased LLL nodule and RLL nodule. CT chest 04/15/24 shows severe emphysema.  New complete right  middle lobe atelectasis and irregular consolidation in the right upper lobe concerning for postobstructive pneumonia and a new right hilar mass measuring 5.8 cm, new 10 mm subcarinal lymph node suspicious for nodal metastasis.   He continues to experience a significant cough and is producing a large amount of mucus. Despite treatments aimed at clearing the mucus, the issue persists.  He remains on Breztri  twice daily.  Using albuterol  nebulizer twice daily followed by flutter valve.  Mornings are particularly difficult, and he becomes short of breath after minimal exertion, such as climbing seven steps, necessitating a rest to catch his breath.  Has had some blood tinged mucus on occasion.  He is currently on oxygen therapy 2 L. there has been no increase in oxygen needs, and he has quit smoking. He occasionally takes aspirin  but not daily.  Is not on Plavix or anticoagulation therapy.  Says that he has had anesthesia in the past with no known difficulties.     Allergies  Allergen Reactions   Flomax  [Tamsulosin  Hcl] Other (See Comments)    Made patient hypotensive, clammy, sweaty, and feel like he was going to pass out (EMS had to be called)   Crestor [Rosuvastatin Calcium ] Other (See Comments)    Lethargy and muscle aches   Lipitor [Atorvastatin Calcium ] Other (See Comments)    Body cramps, lethargy, and muscle aches    Immunization History  Administered Date(s) Administered   Pneumococcal Conjugate-13 01/07/2016   Pneumococcal Polysaccharide-23 06/29/2017    Past Medical History:  Diagnosis Date   Arthritis    Carotid artery stenosis without  cerebral infarction    vascular --- dr eliza;   09-24-2007  s/p right CEA   Chronic cholecystitis    w/  large calcified stone in gallbladder   Chronic constipation    COPD (chronic obstructive pulmonary disease) (HCC)    followed by pcp----  (05-28-2021  pt stated last used rescue 05-27-2021 for wheezing resolved after use, stated  exacerbation approx  greater than a year ago)   Coronary artery disease 2002   cardiologist-- dr lavona ;  (05-28-2021  pt denies need to use nitro for CP) per last cath 08-04-2017  LM luminal irregularities, LAD 30% ,D1 ostial 35% ,CFx   AV groove ostial 25% , OM1 long prox 30%,  proxRCA occluded and dominant,   excellent left to right collateral filling.   Depression    Patient denies   DOE (dyspnea on exertion)    due to smoking/ copd   Dyslipidemia    Essential hypertension    Full dentures    GERD (gastroesophageal reflux disease)    History of adenomatous polyp of colon    History of COVID-19    per pt winter 2022, mild to moderate symptoms that resolved   History of diverticulitis of colon    History of kidney stones    Hyperlipidemia    Hypothyroidism    followed by pcp   Lung nodule    s/p bronchoscopy w/ bx in 2005,  benign   Osteoporosis    Smokers' cough (HCC)    05-28-2021  pt stated occasionally productive w/ white phlegm   Wears glasses     Tobacco History: Social History   Tobacco Use  Smoking Status Former   Current packs/day: 1.50   Average packs/day: 1 pack/day for 58.8 years (59.2 ttl pk-yrs)   Types: Cigarettes   Start date: 2025  Smokeless Tobacco Never  Tobacco Comments   Quit smoking mid September 2025   Counseling given: Not Answered Tobacco comments: Quit smoking mid September 2025   Outpatient Medications Prior to Visit  Medication Sig Dispense Refill   albuterol  (PROVENTIL ) (2.5 MG/3ML) 0.083% nebulizer solution Take 3 mLs (2.5 mg total) by nebulization every 4 (four) hours as needed for wheezing or shortness of breath. 150 mL 1   aspirin  EC 81 MG tablet Take 1 tablet (81 mg total) by mouth daily with breakfast. Swallow whole. 30 tablet 5   Bempedoic Acid -Ezetimibe  (NEXLIZET ) 180-10 MG TABS TAKE ONE TABLET DAILY 90 tablet 1   budesonide -glycopyrrolate -formoterol  (BREZTRI  AEROSPHERE) 160-9-4.8 MCG/ACT AERO inhaler Inhale 2 puffs into the  lungs in the morning and at bedtime. 10.7 g 11   fluticasone  (FLONASE ) 50 MCG/ACT nasal spray Place 2 sprays into both nostrils daily. 16 g 6   hydrOXYzine  (VISTARIL ) 25 MG capsule Take 1 capsule (25 mg total) by mouth every 8 (eight) hours as needed. 30 capsule 2   levothyroxine  (SYNTHROID ) 50 MCG tablet Take 1 tablet (50 mcg total) by mouth daily before breakfast. 90 tablet 3   nitroGLYCERIN  (NITROSTAT ) 0.4 MG SL tablet Place 1 tablet (0.4 mg total) under the tongue every 5 (five) minutes as needed for chest pain. 25 tablet 3   omeprazole  (PRILOSEC) 20 MG capsule Take 1 capsule (20 mg total) by mouth daily. 30 capsule 5   OXYGEN Inhale 2 L into the lungs daily.     traZODone  (DESYREL ) 100 MG tablet Take 1 tablet (100 mg total) by mouth at bedtime. 90 tablet 1   valsartan  (DIOVAN ) 160 MG tablet Take 1 tablet (  160 mg total) by mouth daily. 30 tablet 11   amoxicillin -clavulanate (AUGMENTIN ) 875-125 MG tablet Take 1 tablet by mouth 2 (two) times daily. (Patient not taking: Reported on 04/21/2024) 20 tablet 0   guaiFENesin  (MUCINEX ) 600 MG 12 hr tablet Take 1 tablet (600 mg total) by mouth 2 (two) times daily. (Patient not taking: Reported on 04/21/2024) 60 tablet 2   isosorbide  mononitrate (IMDUR ) 30 MG 24 hr tablet Take 1 tablet (30 mg total) by mouth daily. (Patient not taking: Reported on 04/21/2024) 30 tablet 5   nicotine  (NICODERM CQ ) 14 mg/24hr patch Place 1 patch (14 mg total) onto the skin daily. (Patient not taking: Reported on 04/21/2024) 28 patch 1   predniSONE  (DELTASONE ) 10 MG tablet 4 tabs for 2 days, then 3 tabs for 2 days, 2 tabs for 2 days, then 1 tab for 2 days, then stop (Patient not taking: Reported on 04/21/2024) 20 tablet 0   No facility-administered medications prior to visit.     Review of Systems:   Constitutional:   No  weight loss, night sweats,  Fevers, chills, +fatigue, or  lassitude.  HEENT:   No headaches,  Difficulty swallowing,  Tooth/dental problems, or  Sore  throat,                No sneezing, itching, ear ache, nasal congestion, post nasal drip,   CV:  No chest pain,  Orthopnea, PND, swelling in lower extremities, anasarca, dizziness, palpitations, syncope.   GI  No heartburn, indigestion, abdominal pain, nausea, vomiting, diarrhea, change in bowel habits, loss of appetite, bloody stools.   Resp: .  No chest wall deformity  Skin: no rash or lesions.  GU: no dysuria, change in color of urine, no urgency or frequency.  No flank pain, no hematuria   MS:  No joint pain or swelling.  No decreased range of motion.  No back pain.    Physical Exam  BP (!) 102/58   Pulse 94   Ht 5' 10 (1.778 m) Comment: per pt  Wt 141 lb 9.6 oz (64.2 kg)   SpO2 97% Comment: 2L pulse o2  BMI 20.32 kg/m   GEN: A/Ox3; pleasant , NAD, frail, elderly   HEENT:  [place]/AT,  EACs-clear, TMs-wnl, NOSE-clear, THROAT-clear, no lesions, no postnasal drip or exudate noted.   NECK:  Supple w/ fair ROM; no JVD; normal carotid impulses w/o bruits; no thyromegaly or nodules palpated; no lymphadenopathy.    RESP  Clear  P & A; w/o, wheezes/ rales/ or rhonchi. no accessory muscle use, no dullness to percussion  CARD:  RRR, no m/r/g, no peripheral edema, pulses intact, no cyanosis or clubbing.  GI:   Soft & nt; nml bowel sounds; no organomegaly or masses detected.   Musco: Warm bil, no deformities or joint swelling noted.   Neuro: alert, no focal deficits noted.    Skin: Warm, no lesions or rashes    Lab Results:Reviewed 04/21/2024   CBC    Component Value Date/Time   WBC 4.3 01/30/2024 0442   RBC 4.83 01/30/2024 0442   HGB 14.9 01/30/2024 0442   HGB 15.4 10/02/2023 0839   HCT 43.6 01/30/2024 0442   HCT 46.8 10/02/2023 0839   PLT 265 01/30/2024 0442   PLT 240 10/02/2023 0839   MCV 90.3 01/30/2024 0442   MCV 91 10/02/2023 0839   MCH 30.8 01/30/2024 0442   MCHC 34.2 01/30/2024 0442   RDW 12.4 01/30/2024 0442   RDW 12.4 10/02/2023 0839  LYMPHSABS 2.8  12/31/2023 1029   LYMPHSABS 3.1 10/02/2023 0839   MONOABS 0.9 12/31/2023 1029   EOSABS 0.2 12/31/2023 1029   EOSABS 0.1 10/02/2023 0839   BASOSABS 0.0 12/31/2023 1029   BASOSABS 0.0 10/02/2023 0839    BMET    Component Value Date/Time   NA 141 01/30/2024 0442   NA 137 10/08/2023 1425   K 4.2 01/30/2024 0442   CL 107 01/30/2024 0442   CO2 24 01/30/2024 0442   GLUCOSE 166 (H) 01/30/2024 0442   BUN 16 01/30/2024 0442   BUN 19 10/08/2023 1425   CREATININE 1.07 01/30/2024 0442   CALCIUM  9.8 01/30/2024 0442   GFRNONAA >60 01/30/2024 0442   GFRAA 72 03/09/2020 1033    BNP    Component Value Date/Time   BNP 127.0 (H) 01/29/2024 1726    ProBNP No results found for: PROBNP  Imaging: CT CHEST LCS NODULE F/U LOW DOSE WO CONTRAST Addendum Date: 04/21/2024 ADDENDUM REPORT: 04/21/2024 11:36 ADDENDUM: These results will be called to the ordering clinician or representative by the Radiologist Assistant, and communication documented in the PACS or [employer]. Electronically Signed   By: Limin  Xu M.D.   On: 04/21/2024 11:36   Result Date: 04/21/2024 CLINICAL DATA:  Current smoker with 89 pack-year smoking history. Follow-up Lung-RADS 3 EXAM: CT CHEST WITHOUT CONTRAST FOR LUNG CANCER SCREENING NODULE FOLLOW-UP TECHNIQUE: Multidetector CT imaging of the chest was performed following the standard protocol without IV contrast. RADIATION DOSE REDUCTION: This exam was performed according to the departmental dose-optimization program which includes automated exposure control, adjustment of the mA and/or kV according to patient size and/or use of iterative reconstruction technique. COMPARISON:  CT chest dated 10/16/2023 FINDINGS: Cardiovascular: Normal heart size. No significant pericardial fluid/thickening. Great vessels are normal in course and caliber. Coronary artery calcifications and aortic atherosclerosis. Mediastinum/Nodes: Imaged thyroid  gland without nodules meeting criteria for  imaging follow-up by size. Normal esophagus. New 10 mm subcarinal lymph node (2:35). Unchanged 10 mm precarinal lymph node (2:27). Partially calcified right hilar node is again seen, inseparable from the hilar mass described below. Lungs/Pleura: The central airways are patent. Severe paraseptal and centrilobular emphysema. Increased diffuse interlobular septal thickening and moderate diffuse bronchial wall thickening. New complete right middle lobe atelectasis and irregular consolidation in the anteroinferior right upper lobe. Interval development of right hilar mass measuring 5.8 x 4.1 cm (2:36). No pneumothorax. Small right pleural effusion. Upper abdomen: Cholecystectomy.  Calcified splenic granulomata. Musculoskeletal: No acute or abnormal lytic or blastic osseous lesions. IMPRESSION: 1. Lung-RADS 4X, highly suspicious. Additional imaging evaluation or consultation with Pulmonology or Thoracic Surgery recommended. Interval development of right hilar mass measuring 5.8 x 4.1 cm, highly suspicious for primary lung malignancy. 2. New 10 mm subcarinal lymph node, suspicious for nodal metastasis. 3. New complete right middle lobe atelectasis and irregular consolidation in the anteroinferior right upper lobe, which may represent postobstructive atelectasis/pneumonia. 4. Increased diffuse interlobular septal thickening and moderate diffuse bronchial wall thickening, which may represent pulmonary edema. 5. Small right pleural effusion. 6. Aortic Atherosclerosis (ICD10-I70.0) and Emphysema (ICD10-J43.9). Coronary artery calcifications. Assessment for potential risk factor modification, dietary therapy or pharmacologic therapy may be warranted, if clinically indicated. Electronically Signed: By: Limin  Xu M.D. On: 04/21/2024 10:54   DG Chest 2 View Result Date: 04/05/2024 CLINICAL DATA:  pneumonia EXAM: CHEST - 2 VIEW COMPARISON:  03/24/2024 FINDINGS: Similar dense consolidation of the right middle lobe. Trace right  pleural effusion. No pneumothorax. Biapical pleural thickening. No cardiomegaly. Tortuous  aorta with aortic atherosclerosis. No acute fracture or destructive lesions. Multilevel thoracic osteophytosis. IMPRESSION: Similar dense consolidation of the right middle lobe, possibly representing pneumonia or atelectasis. Nonemergent chest CT with IV contrast recommended to exclude a central obstruction. Electronically Signed   By: Rogelia Myers M.D.   On: 04/05/2024 12:43   DG Chest 2 View Result Date: 03/24/2024 CLINICAL DATA:  COPD exacerbation. EXAM: CHEST - 2 VIEW COMPARISON:  January 29, 2024. FINDINGS: The heart size and mediastinal contours are within normal limits. Right middle lobe airspace opacity is noted concerning for pneumonia or atelectasis. Minimal right pleural effusion is noted. Hyperinflation of the lungs is noted. The visualized skeletal structures are unremarkable. IMPRESSION: Right middle lobe airspace opacity is noted most consistent with atelectasis, although potentially may represent pneumonia. Endobronchial obstruction cannot be excluded. Minimal right pleural effusion. Electronically Signed   By: Lynwood Landy Raddle M.D.   On: 03/24/2024 10:38    albuterol  (PROVENTIL ) (2.5 MG/3ML) 0.083% nebulizer solution 2.5 mg     Date Action Dose Route User   03/24/2024 1013 Given 2.5 mg Nebulization Busick, Ashlyn T, CMA      methylPREDNISolone  acetate (DEPO-MEDROL ) injection 80 mg     Date Action Dose Route User   03/24/2024 1055 Given 80 mg Intramuscular (Left Ventrogluteal) Busick, Ashlyn T, CMA           No data to display          No results found for: NITRICOXIDE      No data to display              Assessment & Plan:   Assessment and Plan Assessment & Plan Right lung mass with lymphadenopathy concerning for underlying malignancy with a postobstructive pneumonia  Persistent right lung consolidation with enlarged lymph nodes raises concern for malignancy.  We  reviewed his CT results in detail.  Went over need for additional testing with navigational bronchoscopy with endobronchial ultrasound biopsy with BAL, cultures, cytology/pathology.  We went over procedure and potential risk of anesthesia and complications of biopsy such as infection, bleeding, pneumothorax, he has been scheduled for November 3  Monday at 7:30 AM . Hold aspirin  for two days prior. No food intake after midnight before the procedure. Arrange transportation home post-procedure.  Most likely will need PET scan going forward..   Chronic obstructive pulmonary disease (COPD) with emphysema  -suspect patient has underlying severe COPD with emphysema.  Patient has not had PFTs.  Going forward we will set patient up for pulmonary function testing when able  Chronic productive cough with occasional hemoptysis   Chronic productive cough with occasional hemoptysis. Recent hemoptysis episode on Sunday, not ongoing. Likely related to underlying lung pathology.  Chronic hypoxemia requiring supplemental oxygen   Chronic hypoxemia managed with supplemental oxygen. No recent increase in oxygen requirements. Continue current oxygen therapy.  Tobacco use, currently abstinent   Currently abstinent from tobacco use. Continued abstinence is crucial for lung health. Encourage continued abstinence from smoking.  Pulmonary preop risk assessment.-Patient is fully independent.  However suspect he has underlying at least moderate to severe COPD with emphysema that is now oxygen dependent.  Patient would be considered a moderate surgical risk from a pulmonary standpoint.  Currently stable with his COPD and oxygen requirements.. We reviewed this in detail.  Major Pulmonary risks identified in the multifactorial risk analysis are but not limited to a) pneumonia; b) recurrent intubation risk; c) prolonged or recurrent acute respiratory failure needing mechanical ventilation; d) prolonged  hospitalization; e)  DVT/Pulmonary embolism; f) Acute Pulmonary edema     Plan  Patient Instructions  You are set up for Navigational Bronchoscopy on 04/25/24.  Continue on Breztri  2 puffs Twice daily, rinse after use .  Continue on Albuterol  neb Twice daily  followed by flutter valve. Albuterol  inhaler or neb As needed   Continue on Oxygen 2l/m -24/7  Great job not smoking  Follow up in 2 weeks with Dr. Shelah  or Denecia Brunette NP and As needed   Please contact office for sooner follow up if symptoms do not improve or worsen or seek emergency care      Madelin Stank, NP 04/21/2024

## 2024-04-21 NOTE — Progress Notes (Signed)
 PCP - Dr. Fonda Dettinger Cardiologist - Dr. Lynwood Schilling  PPM/ICD - denies   Chest x-ray - 04/05/24 EKG - 12/31/23 Stress Test - 01/06/24 ECHO - 01/01/24 Cardiac Cath - 08/04/17  CPAP - denies  DM- denies  Blood Thinner Instructions: n/a Aspirin  Instructions: hold 2 days. Last dose 10/31  ERAS Protcol - no, NPO  COVID TEST- n/a  Anesthesia review: yes, cardiac hx  Patient verbally denies any shortness of breath, fever, cough and chest pain during phone call      Questions were answered. Patient verbalized understanding of instructions.

## 2024-04-21 NOTE — Telephone Encounter (Signed)
 I spoke with Mr. Mccahill about the results of his low dose Ct Chest. I explained that there is a new right hilar mass, that was not there in April. We discussed that this mass may be the reason for the pneumonia, and that we need to do a bronchoscopy with biopsy to inspect his airways around the mass, and also lymph nodes that are also enlarged on his scan. He agreed that the next best step moving forward was for biopsy.   I spoke with the patient about bronchoscopy with biopsy . He understands we have him scheduled or 04/25/2024 at 7:30 am at [facility]. We discussed the procedure in full. He is not on blood thinners. He does take aspirin  daily. I have asked him to hold aspirin  x 48 hours prior to the procedure. He verbalized understanding.He understands he will get a letter today at his appointment with Madelin Stank NP with further explanation of the procedure and details of where to be , and when. I told him if he has any questions between now and then, Tammy will be able to answer them at his OV at 4 pm today. Pt. Verbalized understanding of the above, and  had no further questions at completion of the call.

## 2024-04-21 NOTE — Telephone Encounter (Signed)
 I agree that he needs bronchoscopy plus EBUS ASAP, can be any provider who performs EBUS.  Will be happy to help you set up if we believe he is a candidate

## 2024-04-21 NOTE — Patient Instructions (Addendum)
 You are set up for Navigational Bronchoscopy on 04/25/24.  Continue on Breztri  2 puffs Twice daily, rinse after use .  Continue on Albuterol  neb Twice daily  followed by flutter valve. Albuterol  inhaler or neb As needed   Continue on Oxygen 2l/m -24/7  Great job not smoking  Follow up in 2 weeks with Dr. Shelah  or Mel Tadros NP and As needed   Please contact office for sooner follow up if symptoms do not improve or worsen or seek emergency care

## 2024-04-22 NOTE — Progress Notes (Signed)
 Anesthesia Chart Review: SAME DAY WORK-UP  Case: [id number] Date/Time: 04/25/24 0730   Procedure: BRONCHOSCOPY, WITH EBUS (Right) - Right hilar mass and suspected  lymph node involvement   Anesthesia type: General   Diagnosis: Hilar mass [R91.8]   Pre-op diagnosis: riht hilar mass   [facility] / [facility]   Surgeons: Shelah Lamar RAMAN, MD       DISCUSSION: Patient is a 73 year old male scheduled for the above procedure. Per 04/20/2024 pulmonology visit on 04/21/2024, Right lung mass with lymphadenopathy concerning for underlying malignancy with a postobstructive pneumonia  Persistent right lung consolidation with enlarged lymph nodes raises concern for malignancy.  Above procedure planned.  History includes former smoker (quit 01/29/2024), COPD (home O2 2L initiated 01/2024 following hospitalization for COPD exacerbation), lung nodule, CAD (dominant pRCA CTA with left-to-right collaterals, o/w non-obstructive CAD 08/04/2017), HLD, carotid artery stenosis (right carotid endarterectomy 2009), HTN, hypothyroidism, exertional dyspnea, GERD cholecystectomy (05/30/2021).  Last cardiology visit was on 02/26/2024 with Miriam Norris, NP.  He was hospitalized in July 2025 with chest pain.  EKG showed bigeminy PVCs.  Echocardiogram and Lexiscan  studies done.  TTE showed LVEF 55 to 60%, no regional wall motion abnormalities, grade 1 diastolic dysfunction, normal RV systolic function, normal PASP.  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  He did report some chest tightness improved after inhaler use and thought more related to COPD.  He does have nitroglycerin  as needed for chest pain.  No further ischemic evaluation felt indicated at that time.  6 months follow-up planned.  Last ASA planned for 04/22/2024.   Anesthesia team to evaluate on the  day of surgery.  VS:  BP Readings from Last 3 Encounters:  04/21/24 (!) 102/58  04/05/24 120/60  03/24/24 128/68   Pulse Readings from Last 3 Encounters:  04/21/24 94  04/05/24 94  03/24/24 81     PROVIDERS: Dettinger, [name], MD is PCP  Lavona Agent, MD is cardiologist Shelah Lamar, MD is pulmonologist  LABS: For day of surgery as indicated. Most recent results in [facility] include: Lab Results  Component Value Date   WBC 4.3 01/30/2024   HGB 14.9 01/30/2024   HCT 43.6 01/30/2024   PLT 265 01/30/2024   GLUCOSE 166 (H) 01/30/2024   CHOL 126 10/02/2023   TRIG 67 10/02/2023   HDL 51 10/02/2023   LDLCALC 61 10/02/2023   ALT 10 12/31/2023   AST 15 12/31/2023   NA 141 01/30/2024   K 4.2 01/30/2024   CL 107 01/30/2024   CREATININE 1.07 01/30/2024   BUN 16 01/30/2024   CO2 24 01/30/2024   TSH 1.920 10/02/2023   HGBA1C 6.1 (H) 10/02/2023     IMAGES: CT Chest LCS 04/15/2024: IMPRESSION: 1. Lung-RADS 4X, highly suspicious. Additional imaging evaluation or consultation with Pulmonology or Thoracic Surgery recommended. Interval development of right hilar mass measuring 5.8 x 4.1 cm, highly suspicious for primary lung malignancy. 2. New 10 mm subcarinal lymph node, suspicious for nodal metastasis. 3. New complete right middle lobe atelectasis and irregular consolidation in the anteroinferior right upper lobe, which may represent postobstructive atelectasis/pneumonia. 4. Increased diffuse interlobular septal thickening and moderate diffuse bronchial wall thickening, which may represent pulmonary edema. 5. Small right pleural effusion. 6. Aortic Atherosclerosis (ICD10-I70.0) and Emphysema (ICD10-J43.9). Coronary artery calcifications. Assessment for potential risk factor modification, dietary therapy or pharmacologic therapy may be  warranted, if clinically indicated.   EKG:12/31/2023: Sinus rhythm new Ventricular bigeminy Anteroseptal infarct, old Repol abnrm,  global ischemia, diffuse leads Minimal ST elevation, lateral leads Confirmed by Doretha Folks ([id number]) on 01/01/2024 10:23:50 PM  CV: Nuclear stress test 01/06/2024:   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 The study is intermediate risk. Risk based on decreased LVEF, there is no current myocardium at jeopardy. Consider correlating LVEF with echo.   No ST deviation was noted.   LV perfusion is abnormal. Large severe intensity inferior/inferoapical defect that is fixed. Defect complicated by adjacent gut radiotracer uptake, however wall motion abnormality would support prior infarct. No current ischemia.   Left ventricular function is abnormal. Nuclear stress EF: 45%. The left ventricular ejection fraction is mildly decreased (45-54%). End diastolic cavity size is normal.    Echo 01/01/2024: IMPRESSIONS   1. Challenging study due to COPD. Poor Echo images. Limited interrogation  of valves.   2. Left ventricular ejection fraction, by estimation, is 55 to 60%. The  left ventricle has normal function. The left ventricle has no regional  wall motion abnormalities. Left ventricular diastolic parameters are  consistent with Grade I diastolic  dysfunction (impaired relaxation).   3. Right ventricular systolic function is normal. The right ventricular  size is normal. There is normal pulmonary artery systolic pressure.   4. The mitral valve was not well visualized. No evidence of mitral valve  regurgitation. No evidence of mitral stenosis.   5. The aortic valve was not well visualized. Aortic valve regurgitation  is not visualized. No aortic stenosis is present.   6. The inferior vena cava is normal in size with greater than 50%  respiratory variability, suggesting right atrial pressure of 3 mmHg.    US  Carotid 07/04/2022: Summary:  - Right Carotid: Velocities in the right ICA are consistent with a 1-39% stenosis.  - Left Carotid: Velocities in the left ICA are  consistent with a 40-59% stenosis.  - Vertebrals: Bilateral vertebral arteries demonstrate antegrade flow.  - Subclavians: Normal flow hemodynamics were seen in bilateral subclavian arteries.    Cardiac cath 08/04/2017: Prox RCA to Mid RCA lesion is 100% stenosed. Ost Cx to Prox Cx lesion is 60% stenosed. Ost 1st Mrg lesion is 40% stenosed. Mid LAD to Dist LAD lesion is 50% stenosed. Dist LM to Prox LAD lesion is 20% stenosed. The left ventricular systolic function is normal. LV end diastolic pressure is normal. The left ventricular ejection fraction is 55-65% by visual estimate. There is no mitral valve regurgitation.   1. Chronic total occlusion of the large, dominant mid RCA with filling of the distal RCA from right to right bridging collaterals and left to right collaterals.  2. The LAD is a large caliber vessel that courses to the apex. The mid to distal LAD has a moderate stenosis. This does not appear to be flow limiting. The first diagonal branch is moderate in caliber and has a moderate stenosis.  3. The Circumflex artery is a moderate caliber vessel that supplies two OM branches. There is a moderate stenosis in the proximal Circumflex extending back to the ostium. This is looked at closely in multiple views and does not appear to be flow limiting. This involves a moderate caliber first OM branch which has moderate ostial stenosis.  4. Normal LV systolic function   Recommendations: I would continue with medical management of his CAD. Consider addition of long acting nitrates. Tobacco cessation is recommended.    Past Medical History:  Diagnosis Date   Arthritis    Carotid artery stenosis without cerebral infarction    vascular --- dr eliza;   09-24-2007  s/p right CEA   Chronic cholecystitis    w/  large calcified stone in gallbladder   Chronic constipation    COPD (chronic obstructive pulmonary disease) (HCC)    followed by pcp----  (05-28-2021  pt stated last used rescue  05-27-2021 for wheezing resolved after use, stated exacerbation approx  greater than a year ago)   Coronary artery disease 2002   cardiologist-- dr lavona ;  (05-28-2021  pt denies need to use nitro for CP) per last cath 08-04-2017  LM luminal irregularities, LAD 30% ,D1 ostial 35% ,CFx   AV groove ostial 25% , OM1 long prox 30%,  proxRCA occluded and dominant,   excellent left to right collateral filling.   Depression    Patient denies   DOE (dyspnea on exertion)    due to smoking/ copd   Dyslipidemia    Essential hypertension    Full dentures    GERD (gastroesophageal reflux disease)    History of adenomatous polyp of colon    History of COVID-19    per pt winter 2022, mild to moderate symptoms that resolved   History of diverticulitis of colon    History of kidney stones    Hyperlipidemia    Hypothyroidism    followed by pcp   Lung nodule    s/p bronchoscopy w/ bx in 2005,  benign   Osteoporosis    Smokers' cough (HCC)    05-28-2021  pt stated occasionally productive w/ white phlegm   Wears glasses     Past Surgical History:  Procedure Laterality Date   BRONCHOSCOPY  2005   CARDIAC CATHETERIZATION  08/11/2000   @[facility]  by dr morris;   preserved LVF,  ostial RCA 70-75%, other nonobstructive disease   CARDIAC CATHETERIZATION  03/30/2008   @[facility]  by dr hochrein;  occluded RCA and other nonobstructive involving LAD, CFx, D1,OM1  and left to right collateral filling,  mild inferior hypokinesis,  ef 55%   CAROTID ENDARTERECTOMY Right 09/24/2007   @[facility]  by dr eliza   COLONOSCOPY  01/19/2017   by armsbruster   EXCISION NEUROMA Left 10/01/2005   @[facility] ;  left inguinal   EXTRACORPOREAL SHOCK WAVE LITHOTRIPSY  2015   INGUINAL HERNIA REPAIR Left    01-13-2003 and 07-22-2006 both @[facility]    LAPAROSCOPIC CHOLECYSTECTOMY SINGLE SITE WITH INTRAOPERATIVE CHOLANGIOGRAM N/A 05/30/2021   Procedure: LAPAROSCOPIC CHOLECYSTECTOMY SINGLE SITE WITH INTRAOPERATIVE CHOLANGIOGRAM;  Surgeon: Sheldon Standing,  MD;  Location: [facility] [place];  Service: General;  Laterality: N/A;   LAPAROSCOPIC INGUINAL HERNIA REPAIR Bilateral 04/28/2007   @[facility]    LEFT HEART CATH AND CORONARY ANGIOGRAPHY N/A 08/04/2017   Procedure: LEFT HEART CATH AND CORONARY ANGIOGRAPHY;  Surgeon: Verlin Lonni BIRCH, MD;  Location: [facility];  Service: Cardiovascular;  Laterality: N/A;   THROAT SURGERY  1997   pt stated removal of tumor that was benign    MEDICATIONS: No current facility-administered medications for this encounter.    albuterol  (PROVENTIL ) (2.5 MG/3ML) 0.083% nebulizer solution   amoxicillin -clavulanate (AUGMENTIN ) 875-125 MG tablet   aspirin  EC 81 MG tablet   Bempedoic Acid -Ezetimibe  (NEXLIZET ) 180-10 MG TABS   budesonide -glycopyrrolate -formoterol  (BREZTRI  AEROSPHERE) 160-9-4.8 MCG/ACT AERO inhaler   fluticasone  (FLONASE ) 50 MCG/ACT nasal spray   guaiFENesin  (MUCINEX ) 600 MG 12 hr tablet   hydrOXYzine  (VISTARIL ) 25 MG capsule   isosorbide  mononitrate (IMDUR ) 30 MG 24  hr tablet   levothyroxine  (SYNTHROID ) 50 MCG tablet   nicotine  (NICODERM CQ ) 14 mg/24hr patch   nitroGLYCERIN  (NITROSTAT ) 0.4 MG SL tablet   omeprazole  (PRILOSEC) 20 MG capsule   OXYGEN   predniSONE  (DELTASONE ) 10 MG tablet   traZODone  (DESYREL ) 100 MG tablet   valsartan  (DIOVAN ) 160 MG tablet   5 medication list he is not currently taking Augmentin , Mucinex , Imdur , NicoDerm CQ , prednisone .   Isaiah Ruder, PA-C Surgical Short Stay/Anesthesiology [facility] Phone [phone] [facility] Phone [phone] 04/22/2024 11:41 AM

## 2024-04-22 NOTE — Anesthesia Preprocedure Evaluation (Addendum)
 Anesthesia Evaluation  Patient identified by MRN, date of birth, ID band Patient awake    Reviewed: Allergy & Precautions, NPO status , Patient's Chart, lab work & pertinent test results  Airway Mallampati: I  TM Distance: >3 FB Neck ROM: Full    Dental  (+) Dental Advisory Given   Pulmonary COPD, Patient abstained from smoking., former smoker   breath sounds clear to auscultation       Cardiovascular hypertension, Pt. on medications + angina  + CAD, + Peripheral Vascular Disease and + DOE   Rhythm:Regular Rate:Normal     Neuro/Psych   Anxiety Depression     Neuromuscular disease    GI/Hepatic Neg liver ROS,GERD  ,,  Endo/Other  Hypothyroidism    Renal/GU negative Renal ROS     Musculoskeletal  (+) Arthritis ,    Abdominal   Peds  Hematology negative hematology ROS (+)   Anesthesia Other Findings   Reproductive/Obstetrics                              Anesthesia Physical Anesthesia Plan  ASA: 3  Anesthesia Plan: General   Post-op Pain Management:    Induction: Intravenous  PONV Risk Score and Plan: 2 and Ondansetron , Treatment may vary due to age or medical condition and Midazolam   Airway Management Planned: Oral ETT  Additional Equipment:   Intra-op Plan:   Post-operative Plan: Extubation in OR  Informed Consent: I have reviewed the patients History and Physical, chart, labs and discussed the procedure including the risks, benefits and alternatives for the proposed anesthesia with the patient or authorized representative who has indicated his/her understanding and acceptance.     Dental advisory given  Plan Discussed with: CRNA  Anesthesia Plan Comments: (PAT note written 04/22/2024 by Isaiah Ruder, PA-C.  )         Anesthesia Quick Evaluation

## 2024-04-25 ENCOUNTER — Encounter (HOSPITAL_COMMUNITY)
Admission: RE | Disposition: A | Discharge status: Home / Self Care | Payer: Self-pay | Source: Home / Self Care | Attending: Emergency Medicine

## 2024-04-25 ENCOUNTER — Other Ambulatory Visit: Payer: Self-pay | Admitting: Emergency Medicine

## 2024-04-25 ENCOUNTER — Encounter (HOSPITAL_COMMUNITY): Payer: Self-pay | Admitting: Emergency Medicine

## 2024-04-25 ENCOUNTER — Ambulatory Visit (HOSPITAL_COMMUNITY): Payer: Self-pay | Admitting: Vascular Surgery

## 2024-04-25 ENCOUNTER — Ambulatory Visit (HOSPITAL_COMMUNITY)

## 2024-04-25 ENCOUNTER — Observation Stay (HOSPITAL_COMMUNITY)
Admission: RE | Admit: 2024-04-25 | Discharge: 2024-04-25 | Disposition: A | Discharge status: Home / Self Care | Attending: Emergency Medicine | Admitting: Emergency Medicine

## 2024-04-25 DIAGNOSIS — E039 Hypothyroidism, unspecified: Secondary | ICD-10-CM | POA: Insufficient documentation

## 2024-04-25 DIAGNOSIS — R918 Other nonspecific abnormal finding of lung field: Secondary | ICD-10-CM | POA: Diagnosis not present

## 2024-04-25 DIAGNOSIS — Z122 Encounter for screening for malignant neoplasm of respiratory organs: Secondary | ICD-10-CM | POA: Insufficient documentation

## 2024-04-25 DIAGNOSIS — M810 Age-related osteoporosis without current pathological fracture: Secondary | ICD-10-CM | POA: Insufficient documentation

## 2024-04-25 DIAGNOSIS — I4891 Unspecified atrial fibrillation: Secondary | ICD-10-CM | POA: Diagnosis present

## 2024-04-25 DIAGNOSIS — I480 Paroxysmal atrial fibrillation: Secondary | ICD-10-CM | POA: Diagnosis present

## 2024-04-25 DIAGNOSIS — R590 Localized enlarged lymph nodes: Secondary | ICD-10-CM | POA: Diagnosis not present

## 2024-04-25 DIAGNOSIS — J449 Chronic obstructive pulmonary disease, unspecified: Secondary | ICD-10-CM | POA: Diagnosis not present

## 2024-04-25 DIAGNOSIS — Z79899 Other long term (current) drug therapy: Secondary | ICD-10-CM | POA: Diagnosis not present

## 2024-04-25 DIAGNOSIS — Z87891 Personal history of nicotine dependence: Secondary | ICD-10-CM | POA: Insufficient documentation

## 2024-04-25 DIAGNOSIS — I25119 Atherosclerotic heart disease of native coronary artery with unspecified angina pectoris: Secondary | ICD-10-CM | POA: Insufficient documentation

## 2024-04-25 DIAGNOSIS — I10 Essential (primary) hypertension: Secondary | ICD-10-CM | POA: Diagnosis not present

## 2024-04-25 DIAGNOSIS — I2510 Atherosclerotic heart disease of native coronary artery without angina pectoris: Secondary | ICD-10-CM | POA: Diagnosis not present

## 2024-04-25 DIAGNOSIS — C342 Malignant neoplasm of middle lobe, bronchus or lung: Principal | ICD-10-CM | POA: Insufficient documentation

## 2024-04-25 DIAGNOSIS — Z8616 Personal history of COVID-19: Secondary | ICD-10-CM | POA: Insufficient documentation

## 2024-04-25 HISTORY — PX: BRONCHIAL BRUSHINGS: SHX5108

## 2024-04-25 HISTORY — PX: VIDEO BRONCHOSCOPY WITH ENDOBRONCHIAL ULTRASOUND: SHX6177

## 2024-04-25 HISTORY — PX: BRONCHIAL NEEDLE ASPIRATION BIOPSY: SHX5106

## 2024-04-25 HISTORY — PX: BRONCHIAL BIOPSY: SHX5109

## 2024-04-25 LAB — CBC
HCT: 38.7 % — ABNORMAL LOW (ref 39.0–52.0)
Hemoglobin: 12.9 g/dL — ABNORMAL LOW (ref 13.0–17.0)
MCH: 29.3 pg (ref 26.0–34.0)
MCHC: 33.3 g/dL (ref 30.0–36.0)
MCV: 87.8 fL (ref 80.0–100.0)
Platelets: 276 K/uL (ref 150–400)
RBC: 4.41 MIL/uL (ref 4.22–5.81)
RDW: 12.6 % (ref 11.5–15.5)
WBC: 9.3 K/uL (ref 4.0–10.5)
nRBC: 0 % (ref 0.0–0.2)

## 2024-04-25 LAB — BASIC METABOLIC PANEL WITH GFR
Anion gap: 12 (ref 5–15)
BUN: 18 mg/dL (ref 8–23)
CO2: 25 mmol/L (ref 22–32)
Calcium: 11.2 mg/dL — ABNORMAL HIGH (ref 8.9–10.3)
Chloride: 99 mmol/L (ref 98–111)
Creatinine, Ser: 1.53 mg/dL — ABNORMAL HIGH (ref 0.61–1.24)
GFR, Estimated: 48 mL/min — ABNORMAL LOW (ref >60)
Glucose, Bld: 112 mg/dL — ABNORMAL HIGH (ref 70–99)
Potassium: 3.9 mmol/L (ref 3.5–5.1)
Sodium: 136 mmol/L (ref 135–145)

## 2024-04-25 SURGERY — BRONCHOSCOPY, WITH EBUS
Anesthesia: General

## 2024-04-25 MED ORDER — IPRATROPIUM-ALBUTEROL 0.5-2.5 (3) MG/3ML IN SOLN
RESPIRATORY_TRACT | Status: AC
Start: 2024-04-25 — End: 2024-04-25
  Filled 2024-04-25: qty 3

## 2024-04-25 MED ORDER — SUGAMMADEX SODIUM 200 MG/2ML IV SOLN
INTRAVENOUS | Status: DC | PRN
  Administered 2024-04-25: 200 mg via INTRAVENOUS

## 2024-04-25 MED ORDER — HYDROMORPHONE HCL 1 MG/ML IJ SOLN: 0.2500 mg | INTRAMUSCULAR | Status: DC | PRN

## 2024-04-25 MED ORDER — ONDANSETRON HCL 4 MG/2ML IJ SOLN
INTRAMUSCULAR | Status: DC | PRN
  Administered 2024-04-25: 4 mg via INTRAVENOUS

## 2024-04-25 MED ORDER — ALBUTEROL SULFATE (2.5 MG/3ML) 0.083% IN NEBU: 2.5000 mg | INHALATION_SOLUTION | Freq: Four times a day (QID) | RESPIRATORY_TRACT | Status: DC | PRN

## 2024-04-25 MED ORDER — CHLORHEXIDINE GLUCONATE 0.12 % MT SOLN
15.0000 mL | Freq: Once | OROMUCOSAL | Status: AC
  Administered 2024-04-25: 15 mL via OROMUCOSAL
  Filled 2024-04-25: qty 15

## 2024-04-25 MED ORDER — LIDOCAINE 2% (20 MG/ML) 5 ML SYRINGE
INTRAMUSCULAR | Status: DC | PRN
  Administered 2024-04-25: 60 mg via INTRAVENOUS
  Administered 2024-04-25: 10 mg via INTRAVENOUS

## 2024-04-25 MED ORDER — PROPOFOL 10 MG/ML IV BOLUS
INTRAVENOUS | Status: DC | PRN
  Administered 2024-04-25: 20 mg via INTRAVENOUS
  Administered 2024-04-25: 120 mg via INTRAVENOUS

## 2024-04-25 MED ORDER — OXYCODONE HCL 5 MG/5ML PO SOLN: 5.0000 mg | Freq: Once | ORAL | Status: DC | PRN

## 2024-04-25 MED ORDER — LIDOCAINE 2% (20 MG/ML) 5 ML SYRINGE: INTRAMUSCULAR | Status: DC | PRN

## 2024-04-25 MED ORDER — PHENYLEPHRINE 80 MCG/ML (10ML) SYRINGE FOR IV PUSH (FOR BLOOD PRESSURE SUPPORT)
PREFILLED_SYRINGE | INTRAVENOUS | Status: DC | PRN
  Administered 2024-04-25 (×4): 80 ug via INTRAVENOUS

## 2024-04-25 MED ORDER — IPRATROPIUM-ALBUTEROL 0.5-2.5 (3) MG/3ML IN SOLN
3.0000 mL | RESPIRATORY_TRACT | Status: DC
  Administered 2024-04-25: 3 mL via RESPIRATORY_TRACT

## 2024-04-25 MED ORDER — ESMOLOL HCL 100 MG/10ML IV SOLN
INTRAVENOUS | Status: DC | PRN
  Administered 2024-04-25: 20 mg via INTRAVENOUS

## 2024-04-25 MED ORDER — SODIUM CHLORIDE 0.9% FLUSH
3.0000 mL | Freq: Two times a day (BID) | INTRAVENOUS | Status: DC
Start: 2024-04-25 — End: 2024-04-25

## 2024-04-25 MED ORDER — ACETAMINOPHEN 650 MG RE SUPP: 650.0000 mg | Freq: Four times a day (QID) | RECTAL | Status: DC | PRN

## 2024-04-25 MED ORDER — AMISULPRIDE (ANTIEMETIC) 5 MG/2ML IV SOLN: 10.0000 mg | Freq: Once | INTRAVENOUS | Status: DC | PRN

## 2024-04-25 MED ORDER — OXYCODONE HCL 5 MG PO TABS: 5.0000 mg | ORAL_TABLET | Freq: Once | ORAL | Status: DC | PRN

## 2024-04-25 MED ORDER — ROCURONIUM BROMIDE 10 MG/ML (PF) SYRINGE
PREFILLED_SYRINGE | INTRAVENOUS | Status: DC | PRN
  Administered 2024-04-25: 50 mg via INTRAVENOUS

## 2024-04-25 MED ORDER — ACETAMINOPHEN 325 MG PO TABS: 650.0000 mg | ORAL_TABLET | Freq: Four times a day (QID) | ORAL | Status: DC | PRN

## 2024-04-25 MED ORDER — SODIUM CHLORIDE (PF) 0.9 % IJ SOLN
PREFILLED_SYRINGE | INTRAVENOUS | Status: DC | PRN
  Administered 2024-04-25: 6 mL
  Administered 2024-04-25: 2 mL

## 2024-04-25 MED ORDER — EPINEPHRINE 1 MG/10ML IV SOSY
PREFILLED_SYRINGE | INTRAVENOUS | Status: AC
  Filled 2024-04-25: qty 10

## 2024-04-25 MED ORDER — LACTATED RINGERS IV SOLN: INTRAVENOUS | Status: DC

## 2024-04-25 MED ORDER — DEXAMETHASONE SOD PHOSPHATE PF 10 MG/ML IJ SOLN
INTRAMUSCULAR | Status: DC | PRN
  Administered 2024-04-25: 5 mg via INTRAVENOUS

## 2024-04-25 NOTE — Op Note (Signed)
 Video Bronchoscopy with Endobronchial Ultrasound Procedure Note  Date of Operation: 04/25/2024  Pre-op Diagnosis: right hilar mass, hilar and mediastinal adenopathy  Post-op Diagnosis: right middle lobe mass, hilar and mediastinal adenopathy  Surgeon: LAMAR CHRIS  Assistants: None  Anesthesia: General endotracheal anesthesia  Operation: Flexible video fiberoptic bronchoscopy with endobronchial ultrasound and biopsies.  Estimated Blood Loss: 30 cc  Complications: None apparent  Indications and History: [name] [name] is a 73 y.o. male with history of tobacco use.  He was found to have enlarging right hilar adenopathy, new right middle lobe airway occlusion on serial CT scan of the chest.  Recommendation made to achieve a tissue diagnosis via endobronchial ultrasound with biopsies.  The risks, benefits, complications, treatment options and expected outcomes were discussed with the patient.  The possibilities of pneumothorax, pneumonia, reaction to medication, pulmonary aspiration, perforation of a viscus, bleeding, failure to diagnose a condition and creating a complication requiring transfusion or operation were discussed with the patient who freely signed the consent.    Description of Procedure: The patient was examined in the preoperative area and history and data from the preprocedure consultation were reviewed. It was deemed appropriate to proceed.  The patient was taken to [facility] room 3, identified as [name] [name] and the procedure verified as Flexible Video Fiberoptic Bronchoscopy.  A Time Out was held and the above information confirmed. After being taken to the operating room general anesthesia was initiated and the patient  was orally intubated. The video fiberoptic bronchoscope was introduced via the endotracheal tube and a general inspection was performed which showed normal airways on the left.  The proximal right mainstem bronchus was somewhat narrowed and  hypervascular.  The right upper lobe airway was normal.  There was an exophytic vascular mass emanating from the right middle lobe airway.  The right lower lobe airways appeared to be patent.  Endobronchial brushings and forceps biopsies were performed on the right middle lobe airway lesion with moderate bleeding initially.  The location was treated with intermittent doses of 1: 10,000 dilution epinephrine , 8 cc total. The standard scope was then withdrawn and the endobronchial ultrasound was used to identify and characterize the peritracheal, hilar and bronchial lymph nodes. Inspection showed enlargement at station 4L.  There were hypervascular areas of enlargement at station 4R and 10R.  There was a station 7 node, more distal station 11R and 12R nodes that were all enlarged. Using real-time ultrasound guidance Wang needle biopsies were take from Station 4L, 7, 11R nodes and were sent for cytology. The patient tolerated the procedure well without apparent complications. There was no significant blood loss. The bronchoscope was withdrawn. Anesthesia was reversed and the patient was taken to the PACU for recovery.   Samples: 1. Wang needle biopsies from 4L node 2. Wang needle biopsies from 7 node 3. Wang needle biopsies from 11R node 4.  Endobronchial brushings from right middle lobe mass 5.  Endobronchial forceps biopsies from right middle lobe mass  Plans:  The patient will be discharged from the PACU to home when recovered from anesthesia. We will review the cytology, pathology and microbiology results with the patient when they become available. Outpatient followup will be with CANDIE Lites, NP.    CHRIS LAMAR S. 04/25/2024

## 2024-04-25 NOTE — Discharge Instructions (Addendum)
 Flexible Bronchoscopy, Care After This sheet gives you information about how to care for yourself after your test. Your doctor may also give you more specific instructions. If you have problems or questions, contact your doctor. Follow these instructions at home: Eating and drinking When you are wide awake, your numbness is gone and your cough and gag reflexes have come back, you may: Start eating only soft foods. Slowly drink liquids. Six hours after the test, go back to your normal diet. Driving Do not drive for 24 hours if you were given a medicine to help you relax (sedative). Do not drive or use heavy machinery while taking prescription pain medicine. General instructions Take over-the-counter and prescription medicines only as told by your doctor. Return to your normal activities as told. Ask what activities are safe for you. Do not use any products that have nicotine  or tobacco in them. This includes cigarettes and e-cigarettes. If you need help quitting, ask your doctor. Keep all follow-up visits as told by your doctor. This is important. It is very important if you had a tissue sample (biopsy) taken. Get help right away if: You have shortness of breath that gets worse. You get light-headed. You feel like you are going to pass out (faint). You have chest pain. You cough up: More than a little blood. More blood than before. Summary Do not use cigarettes. Do not use e-cigarettes. Seek care in the Emergency Department right away if you have chest pain or shortness of breath. Call or MyChart Message our office for any questions or problems at [phone].  OK to restart your aspirin  on Wednesday 04/27/2024   This information is not intended to replace advice given to you by your health care provider. Make sure you discuss any questions you have with your health care provider.

## 2024-04-25 NOTE — Discharge Summary (Signed)
 The patient was never admitted to the hospital. He had transient Atrial fib post-bronchoscopy that resolved before he was admitted. He will follow up as an outpatient with cardiology.

## 2024-04-25 NOTE — Anesthesia Procedure Notes (Signed)
 Procedure Name: Intubation Date/Time: 04/25/2024 7:34 AM  Performed by: Dianne Burnard RAMAN, CRNAPre-anesthesia Checklist: Patient identified, Emergency Drugs available, Suction available and Patient being monitored Patient Re-evaluated:Patient Re-evaluated prior to induction Oxygen Delivery Method: Circle system utilized Preoxygenation: Pre-oxygenation with 100% oxygen Induction Type: IV induction Ventilation: Mask ventilation without difficulty Laryngoscope Size: McGrath and 4 Grade View: Grade I Tube type: Oral Tube size: 8.5 mm Number of attempts: 1 Airway Equipment and Method: Stylet and Oral airway Placement Confirmation: ETT inserted through vocal cords under direct vision, positive ETCO2 and breath sounds checked- equal and bilateral Secured at: 21 cm Tube secured with: Tape Dental Injury: Teeth and Oropharynx as per pre-operative assessment

## 2024-04-25 NOTE — Interval H&P Note (Signed)
 History and Physical Interval Note:  04/25/2024 7:20 AM  [name] [name]  has presented today for surgery, with the diagnosis of riht hilar mass.  The various methods of treatment have been discussed with the patient and family. After consideration of risks, benefits and other options for treatment, the patient has consented to  Procedure(s) with comments: BRONCHOSCOPY, WITH EBUS (Right) - Right hilar mass and suspected  lymph node involvement as a surgical intervention.  The patient's history has been reviewed, patient examined, no change in status, stable for surgery.  I have reviewed the patient's chart and labs.  Questions were answered to the patient's satisfaction.     Lamar GORMAN Chris

## 2024-04-25 NOTE — Anesthesia Postprocedure Evaluation (Signed)
 Anesthesia Post Note  Patient: [name] [name]  Procedure(s) Performed: BRONCHOSCOPY, WITH EBUS (Right) BRONCHOSCOPY, WITH BRUSH BIOPSY BRONCHOSCOPY, WITH BIOPSY BRONCHOSCOPY, WITH NEEDLE ASPIRATION BIOPSY     Patient location during evaluation: PACU Anesthesia Type: General Level of consciousness: awake and alert Pain management: pain level controlled Vital Signs Assessment: post-procedure vital signs reviewed and stable Respiratory status: spontaneous breathing, nonlabored ventilation, respiratory function stable and patient connected to nasal cannula oxygen Cardiovascular status: blood pressure returned to baseline and stable Postop Assessment: no apparent nausea or vomiting Anesthetic complications: no   No notable events documented.  Last Vitals:  Vitals:   04/25/24 1015 04/25/24 1030  BP: (!) 95/52 105/62  Pulse: (!) 101 91  Resp: 15 (!) 23  Temp:    SpO2: 97% 95%    Last Pain:  Vitals:   04/25/24 1015  PainSc: 0-No pain                 Butler Levander Pinal

## 2024-04-25 NOTE — Progress Notes (Signed)
 New transient A Fib post-bronchoscopy. Were planning for admission but he went back into NSR w PVC's. We discharged him with plan for him to follow up at Cardology in [place] - he sees [name] Crate, NP.

## 2024-04-25 NOTE — Transfer of Care (Signed)
 Immediate Anesthesia Transfer of Care Note  Patient: [name] [name]  Procedure(s) Performed: BRONCHOSCOPY, WITH EBUS (Right) BRONCHOSCOPY, WITH BRUSH BIOPSY BRONCHOSCOPY, WITH BIOPSY BRONCHOSCOPY, WITH NEEDLE ASPIRATION BIOPSY  Patient Location: PACU  Anesthesia Type:General  Level of Consciousness: awake, alert , and oriented  Airway & Oxygen Therapy: Patient Spontanous Breathing and Patient connected to nasal cannula oxygen  Post-op Assessment: Report given to RN  Post vital signs: Reviewed and stable  Last Vitals:  Vitals Value Taken Time  BP 91/61 04/25/24 08:45  Temp    Pulse 96 04/25/24 08:46  Resp 13 04/25/24 08:45  SpO2 96 % 04/25/24 08:46  Vitals shown include unfiled device data.  Last Pain:  Vitals:   04/25/24 0615  PainSc: 0-No pain         Complications: No notable events documented.

## 2024-04-26 ENCOUNTER — Encounter (HOSPITAL_COMMUNITY): Payer: Self-pay | Admitting: Emergency Medicine

## 2024-04-27 LAB — CYTOLOGY - NON PAP

## 2024-04-28 ENCOUNTER — Ambulatory Visit

## 2024-04-28 ENCOUNTER — Encounter: Payer: Self-pay | Admitting: Nurse Practitioner

## 2024-04-28 ENCOUNTER — Ambulatory Visit: Attending: Nurse Practitioner | Admitting: Nurse Practitioner

## 2024-04-28 VITALS — BP 104/58 | HR 103 | Ht 70.0 in | Wt 138.4 lb

## 2024-04-28 DIAGNOSIS — I6529 Occlusion and stenosis of unspecified carotid artery: Secondary | ICD-10-CM | POA: Diagnosis not present

## 2024-04-28 DIAGNOSIS — I2510 Atherosclerotic heart disease of native coronary artery without angina pectoris: Secondary | ICD-10-CM

## 2024-04-28 DIAGNOSIS — J449 Chronic obstructive pulmonary disease, unspecified: Secondary | ICD-10-CM

## 2024-04-28 DIAGNOSIS — I10 Essential (primary) hypertension: Secondary | ICD-10-CM

## 2024-04-28 DIAGNOSIS — R0602 Shortness of breath: Secondary | ICD-10-CM | POA: Diagnosis not present

## 2024-04-28 DIAGNOSIS — I4891 Unspecified atrial fibrillation: Secondary | ICD-10-CM | POA: Diagnosis not present

## 2024-04-28 DIAGNOSIS — E785 Hyperlipidemia, unspecified: Secondary | ICD-10-CM

## 2024-04-28 MED ORDER — APIXABAN 5 MG PO TABS: 5.0000 mg | ORAL_TABLET | Freq: Two times a day (BID) | ORAL | 0 refills | Status: DC

## 2024-04-28 NOTE — Progress Notes (Signed)
 Cardiology Office Note   Date: 04/28/2024 ID:  [name] [name], DOB 11-24-1950, MRN [id number] PCP: Dettinger, [name], MD  [facility] Providers Cardiologist:  Lynwood Schilling, MD     History of Present Illness [name] [name] is a 73 y.o. male with a PMH of CAD, mixed HLD, HTN, hx of carotid artery stenosis, s/p right CEA, and tobacco use, and COPD, who presents today for hospital follow-up.   Last seen by Dr. Schilling on Oct 29, 2022. Was overall doing well at the time. Was referred to [facility] for a PCSK9i and started on Norvasc  5 mg daily.   Recent hospitalization July 2025 due to atypical CP. Was scheduled for OP Lexiscan . Echo normal. See most recent results of OP Lexiscan  below.   01/29/2024 - He is here for follow-up. He states he is not doing well at all for the past 2 weeks. Mentions worsening shortness of breath and dyspea on exertion at rest. Has been so short of breath recently, says he's been having panic attacks because of it and says he strongly believes he needs to go to the ED to get help. Said he had a bad panic attack last night. Admits to DOE with minimal exertion and even DOE at rest. Denies any chest pain, palpitations, syncope, presyncope, dizziness, orthopnea, PND, swelling or significant weight changes, acute bleeding, or claudication.  She has saw pulmonology yesterday.  Lisinopril  was stopped and switched to valsartan  under 160 mg daily.  Working on getting him qualified for oxygen and inhaler was switched.  He says he checked his SpO2 this morning on his pulse ox on room air, was 82%.  He was short of breath at that time when he checked it.  Does tell me that pulmonology gave an order for oxygen, but says it has not come in yet.  Has been also having issues with his abdomen, admits to feeling of being bloated/also noticing weight loss/constipation.  He was in the hospital in August 2025 due to acute respiratory failure with hypoxia and  community-acquired pneumonia of right lung.  Treated with antibiotics, mucolytic's and bronchodilators.  Discharged on antibiotics.  02/26/2024 -today he is here for follow-up with his wife.  He tells me he now has his oxygen, currently not wearing it today for office visit.  SpO2 today during vital signs is 99% on room air.  Tells me he is feeling better since leaving the hospital.  Does experience chest tightness when he has shortness of breath. Denies any chest pain, shortness of breath, palpitations, syncope, presyncope, dizziness, orthopnea, PND, swelling or significant weight changes, acute bleeding, or claudication.  04/28/2024 -  Here for follow-up with his wife. Continues to wear chronic O2. Does have a current cough, says sometimes coughs up trace of blood. Denies any other bleeding issues. Wife says MD believes his A-fib was caused by his recent procedure. Denies any chest pain, worsening shortness of breath, palpitations, syncope, presyncope, dizziness, orthopnea, PND, swelling or significant weight changes, acute bleeding, or claudication.  ROS: Negative.  See HPI.  Studies Reviewed  EKG:  EKG Interpretation Date/Time:  Thursday April 28 2024 13:53:19 EST Ventricular Rate:  103 PR Interval:  162 QRS Duration:  108 QT Interval:  336 QTC Calculation: 440 R Axis:   23  Text Interpretation: Sinus tachycardia with occasional Premature ventricular complexes Right atrial enlargement Septal infarct , age undetermined When compared with ECG of 25-Apr-2024 09:20, Sinus rhythm has replaced Atrial fibrillation Left bundle branch  block is no longer Present Septal infarct is now Present Confirmed by Miriam Norris [phone]) on 04/28/2024 1:56:45 PM    Lexiscan  12/2023:   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 The study is intermediate risk. Risk based on decreased LVEF, there is no current myocardium at jeopardy. Consider correlating LVEF with echo.   No ST deviation  was noted.   LV perfusion is abnormal. Large severe intensity inferior/inferoapical defect that is fixed. Defect complicated by adjacent gut radiotracer uptake, however wall motion abnormality would support prior infarct. No current ischemia.   Left ventricular function is abnormal. Nuclear stress EF: 45%. The left ventricular ejection fraction is mildly decreased (45-54%). End diastolic cavity size is normal.  Echocardiogram 12/2023:  1. Challenging study due to COPD. Poor Echo images. Limited interrogation  of valves.   2. Left ventricular ejection fraction, by estimation, is 55 to 60%. The  left ventricle has normal function. The left ventricle has no regional  wall motion abnormalities. Left ventricular diastolic parameters are  consistent with Grade I diastolic  dysfunction (impaired relaxation).   3. Right ventricular systolic function is normal. The right ventricular  size is normal. There is normal pulmonary artery systolic pressure.   4. The mitral valve was not well visualized. No evidence of mitral valve  regurgitation. No evidence of mitral stenosis.   5. The aortic valve was not well visualized. Aortic valve regurgitation  is not visualized. No aortic stenosis is present.   6. The inferior vena cava is normal in size with greater than 50%  respiratory variability, suggesting right atrial pressure of 3 mmHg.  Carotid duplex 06/2022: Summary:  Right Carotid: Velocities in the right ICA are consistent with a 1-39%  stenosis.   Left Carotid: Velocities in the left ICA are consistent with a 40-59%  stenosis.   Vertebrals: Bilateral vertebral arteries demonstrate antegrade flow.  Subclavians: Normal flow hemodynamics were seen in bilateral subclavian               arteries.   *See table(s) above for measurements and observations.  Suggest follow up study in 12 months.  AAA 06/2021:  Summary:  Abdominal Aorta: There is evidence of abnormal ectasia of the distal  Abdominal  aorta. The largest aortic measurement is 2.5 cm. The largest  aortic diameter remains essentially unchanged compared to prior exam.  Previous diameter measurement was 2.5 cm  obtained on 02/11/16.  Stenosis: +--------------------+-------------+------------+  Location            Stenosis     Comments      +--------------------+-------------+------------+  Right External Iliac>50% stenosismild/low-end  +--------------------+-------------+------------+      IVC/Iliac: There is no evidence of thrombus involving the IVC.    *See table(s) above for measurements and observations.  Suggest follow up study in 24 months.  LHC 07/2017:  Prox RCA to Mid RCA lesion is 100% stenosed. Ost Cx to Prox Cx lesion is 60% stenosed. Ost 1st Mrg lesion is 40% stenosed. Mid LAD to Dist LAD lesion is 50% stenosed. Dist LM to Prox LAD lesion is 20% stenosed. The left ventricular systolic function is normal. LV end diastolic pressure is normal. The left ventricular ejection fraction is 55-65% by visual estimate. There is no mitral valve regurgitation.   1. Chronic total occlusion of the large, dominant mid RCA with filling of the distal RCA from right to right bridging collaterals and left to right collaterals.  2. The LAD is a large caliber vessel that courses to the  apex. The mid to distal LAD has a moderate stenosis. This does not appear to be flow limiting. The first diagonal branch is moderate in caliber and has a moderate stenosis.  3. The Circumflex artery is a moderate caliber vessel that supplies two OM branches. There is a moderate stenosis in the proximal Circumflex extending back to the ostium. This is looked at closely in multiple views and does not appear to be flow limiting. This involves a moderate caliber first OM branch which has moderate ostial stenosis.  4. Normal LV systolic function   Recommendations: I would continue with medical management of his CAD. Consider addition of long  acting nitrates. Tobacco cessation is recommended.   Physical Exam VS:  BP (!) 104/58   Pulse (!) 103   Ht 5' 10 (1.778 m)   Wt 138 lb 6.4 oz (62.8 kg)   SpO2 (!) 89% Comment: On o2  BMI 19.86 kg/m        Wt Readings from Last 3 Encounters:  04/29/24 140 lb 9.6 oz (63.8 kg)  04/28/24 138 lb 6.4 oz (62.8 kg)  04/25/24 142 lb 9.6 oz (64.7 kg)    GEN: Thin, 73 year old male in acute distress NECK: No JVD; No carotid bruits CARDIAC: S1/S2, RRR, no murmurs, rubs, gallops RESPIRATORY:  Diminished to auscultation with labored work of breathing, tachypnea  ABDOMEN: Soft, non-tender, non-distended EXTREMITIES:  No edema; No deformity   ASSESSMENT AND PLAN  A-fib Had a recent episode most likely triggered by his recent procedure. EKG confirms his in in ST today. Will arrange limited Echo to evaluate his EF and monitor to see if he has any recurrence of his A-fib.  Due to CHA2DS2-VASc score 3, will stop aspirin  and begin Eliquis 5 mg BID and obtain CBC and BMET in 1 week.  Discussed avoiding triggers to A-fib. Care and ED precautions discussed.   CAD Stable with no anginal symptoms. No indication for ischemic evaluation. 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  Recent echo showed normal LVEF.   Care and ED precautions discussed. Updating limited Echo as noted above.   HLD, carotid artery stenosis LDL 61 from April 2025.  Continue current medication regimen.  Carotid duplex from last year showed no significant stenosis along right carotid with moderate left carotid artery stenosis, recommended to repeat testing in 1 year.  Not addressed, but will consider repeating this at next office visit.  Heart healthy diet and regular cardiovascular exercise encouraged.   HTN BP stable.  Continue current medication regimen. Discussed to monitor BP at home at least 2 hours after medications and sitting for 5-10 minutes. Heart healthy diet and regular  cardiovascular exercise encouraged.   4. COPD, shortness of breath Does admit to some shortness of breath at times - will arrange limited Echo as noted above, felt to be related to his COPD.  I congratulated him on quitting smoking.  Continue follow-up with pulmonology who is managing this.  Care and ED precautions discussed.   Dispo: Follow-up with MD/APP in 6 weeks or sooner if any changes.  Signed, [name] Crate, NP

## 2024-04-28 NOTE — Telephone Encounter (Signed)
 Called and got patient scheduled for office visit tomorrow with [name] [name] at 29

## 2024-04-28 NOTE — Telephone Encounter (Signed)
 Soonest would be tomorrow,no openings,unless you are willing to add a 12:00 to tomorrow schedule or double book 11:30

## 2024-04-28 NOTE — Patient Instructions (Addendum)
 Medication Instructions:  Your physician has recommended you make the following change in your medication:   -Stop Aspirin  -Start Eliquis 5 mg twice daily  *If you need a refill on your cardiac medications before your next appointment, please call your pharmacy*  Lab Work: In 1 week:  -CBC -BMET  If you have labs (blood work) drawn today and your tests are completely normal, you will receive your results only by: MyChart Message (if you have MyChart) OR A paper copy in the mail If you have any lab test that is abnormal or we need to change your treatment, we will call you to review the results.  Testing/Procedures: Your physician has requested that you have an echocardiogram. Echocardiography is a painless test that uses sound waves to create images of your heart. It provides your doctor with information about the size and shape of your heart and how well your heart's chambers and valves are working. This procedure takes approximately one hour. There are no restrictions for this procedure. Please do NOT wear cologne, perfume, aftershave, or lotions (deodorant is allowed). Please arrive 15 minutes prior to your appointment time.  Please note: We ask at that you not bring children with you during ultrasound (echo/ vascular) testing. Due to room size and safety concerns, children are not allowed in the ultrasound rooms during exams. Our front office staff cannot provide observation of children in our lobby area while testing is being conducted. An adult accompanying a patient to their appointment will only be allowed in the ultrasound room at the discretion of the ultrasound technician under special circumstances. We apologize for any inconvenience.   Follow-Up: At [facility], you and your health needs are our priority.  As part of our continuing mission to provide you with exceptional heart care, our providers are all part of one team.  This team includes your primary  Cardiologist (physician) and Advanced Practice Providers or APPs (Physician Assistants and Nurse Practitioners) who all work together to provide you with the care you need, when you need it.  Your next appointment:   6 week(s)  Provider:   You may see Lynwood Schilling, MD or the following Advanced Practice Provider on your designated Care Team:   [name] Crate, NP    We recommend signing up for the patient portal called MyChart.  Sign up information is provided on this After Visit Summary.  MyChart is used to connect with patients for Virtual Visits (Telemedicine).  Patients are able to view lab/test results, encounter notes, upcoming appointments, etc.  Non-urgent messages can be sent to your provider as well.   To learn more about what you can do with MyChart, go to [email or web address].   Other Instructions ZIO XT- Long Term Monitor Instructions   Your physician has requested you wear your ZIO patch monitor___14____days.   This is a single patch monitor.  [employer] supplies one patch monitor per enrollment.  Additional stickers are not available.   Please do not apply patch if you will be having a Nuclear Stress Test, Echocardiogram, Cardiac CT, MRI, or Chest Xray during the time frame you would be wearing the monitor. The patch cannot be worn during these tests.  You cannot remove and re-apply the ZIO XT patch monitor.   Your ZIO patch monitor will be sent USPS Priority mail from [facility] directly to your home address. The monitor may also be mailed to a PO BOX if home delivery is not available.   It may take 3-5 days  to receive your monitor after you have been enrolled.   Once you have received you monitor, please review enclosed instructions.  Your monitor has already been registered assigning a specific monitor serial # to you.   Applying the monitor   Shave hair from upper left chest.   Hold abrader disc by orange tab.  Rub abrader in 40 strokes over left upper  chest as indicated in your monitor instructions.   Clean area with 4 enclosed alcohol pads .  Use all pads to assure are is cleaned thoroughly.  Let dry.   Apply patch as indicated in monitor instructions.  Patch will be place under collarbone on left side of chest with arrow pointing upward.   Rub patch adhesive wings for 2 minutes.Remove white label marked 1.  Remove white label marked 2.  Rub patch adhesive wings for 2 additional minutes.   While looking in a mirror, press and release button in center of patch.  A small green light will flash 3-4 times .  This will be your only indicator the monitor has been turned on.     Do not shower for the first 24 hours.  You may shower after the first 24 hours.   Press button if you feel a symptom. You will hear a small click.  Record Date, Time and Symptom in the Patient Log Book.   When you are ready to remove patch, follow instructions on last 2 pages of Patient Log Book.  Stick patch monitor onto last page of Patient Log Book.   Place Patient Log Book in [place] box.  Use locking tab on box and tape box closed securely.  The [employer] has [employer] on it.  Please place in mailbox as soon as possible.  Your physician should have your test results approximately 7 days after the monitor has been mailed back to [facility].   Call [facility] Customer Care at [phone] if you have questions regarding your ZIO XT patch monitor.  Call them immediately if you see an orange light blinking on your monitor.   If your monitor falls off in less than 4 days contact our Monitor department at [phone].  If your monitor becomes loose or falls off after 4 days call [employer] at [phone] for suggestions on securing your monitor.

## 2024-04-29 ENCOUNTER — Encounter: Payer: Self-pay | Admitting: Acute Care

## 2024-04-29 ENCOUNTER — Ambulatory Visit: Admitting: Acute Care

## 2024-04-29 VITALS — BP 136/68 | HR 102 | Temp 97.4°F | Ht 70.0 in | Wt 140.6 lb

## 2024-04-29 DIAGNOSIS — R9389 Abnormal findings on diagnostic imaging of other specified body structures: Secondary | ICD-10-CM

## 2024-04-29 DIAGNOSIS — J449 Chronic obstructive pulmonary disease, unspecified: Secondary | ICD-10-CM | POA: Diagnosis not present

## 2024-04-29 DIAGNOSIS — R911 Solitary pulmonary nodule: Secondary | ICD-10-CM

## 2024-04-29 DIAGNOSIS — Z87891 Personal history of nicotine dependence: Secondary | ICD-10-CM

## 2024-04-29 DIAGNOSIS — Z9889 Other specified postprocedural states: Secondary | ICD-10-CM | POA: Diagnosis not present

## 2024-04-29 DIAGNOSIS — C342 Malignant neoplasm of middle lobe, bronchus or lung: Secondary | ICD-10-CM

## 2024-04-29 NOTE — Progress Notes (Signed)
 History of Present Illness [name] is a 73 y.o. male recently former smoker ( Quit 01/2024 with a 60 + pack year smoking history) with chronic hypoxic respiratory failure( on home oxygen at 2 L [name]) followed through the lung cancer screening program, and followed through the pulmonary clinic for COPD.   Synopsis Pt has been having worsening shortness of breath, he was admitted to [name] from 01/29/2024-01/31/2024,  for a COPD flare and CAP 01/31/2024,and chronic hypoxic respiratory failure . He was treated with Omnicef  and azithromycin  for 3 days along with a prednisone  taper. Pt. Stated he initially saw improvement , but stared noticing worsening breathing difficulties at night. He had several appointments and very close monitoring of his chest imaging 03/24/2024, 04/05/2024, and 04/21/2024. On 04/15/2024 he had his LDCT scan through the lung cancer screening program. His scan was read on 04/21/2024 as a 4X, interval development of a right hilar mass measuring 5.8 x 4.1 cm. He was continuing to have dyspnea. There was also new complete right middle lobe atelectasis and irregular consolidation in the anterioinferior right upper lobe as well as a small right pleural effusion. Plan was for a bronchoscopy with biopsies on 04/25/2024. Pt is here for follow up after bronchoscopy with biopsies.   Lung Cancer Screening History 07/10/2021>> LR 1 07/11/2022>> LR 2 07/20/2023>> New nodules, largest of which in the right lower lobe is categorized as Lung-RADS 4BS, suspicious. Patient had Covid a few weeks before scan, Plan was to do a follow up LDCT in April 2025. 10/16/2023>> LR 3 04/15/2024>> Read 04/21/2024>> LR 4 X>> Bronch done 04/25/2024  04/29/2024 Discussed the use of AI scribe software for clinical note transcription with the patient, who gave verbal consent to proceed.  History of Present Illness Pt. Presents for follow up after bronchoscopy with biopsies. He states he has done well since the  procedure. No bleeding, worsening dyspnea, fever, discolored secretions or adverse reaction to anesthesia.   We have reviewed his biopsy results. 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 Lymph nodes were negative for malignancy.  Pt. Is not a surgical candidate. He will be referred to both medical oncology and radiation oncology urgently as he is very symptomatic with the collapsed right middle. He is wearing oxygen at 2 L [name]. Oxygen sats in the office today were 91%. He does get short of breath conversationally. He does have a cough. The biggest urgency is to get him to RAD Onc asap for treatment of the occluded right middle lobe. He is symptomatic with dyspnea. Hopefully he will experience relief once the right middle lobe mass shrinks with radiation.   Pt. Requested referral to [name] for treatment as this is close to where they live. Urgent External Referral was placed, and patient has appointment with RAD ONC Monday 05/02/2024 at 10:30 am.   I will have the PET scan and MR Brain to complete staging  ordered through [name], so they do not have to travel, and rad onc treatment is not interrupted.       Test Results: Cytology 04/25/2024 FINAL MICROSCOPIC DIAGNOSIS:  A. LUNG, RML, BRUSHING:  - Positive for malignancy.  - Non-small cell carcinoma favor squamous cell carcinoma.   B. LUNG, RML, BIOPSY:  - Non-small cell carcinoma favor squamous cell carcinoma.   C. LYMPH NODE, 4L, FINE NEEDLE ASPIRATION:  - Negative for malignancy.  - Lymphoid tissue consistent with lymph node sampling.   D. LYMPH NODE, 7, FINE NEEDLE  ASPIRATION:  - Negative for malignancy.  - Lymphoid tissue consistent with lymph node sampling.   E. LYMPH NODE, 11R, FINE NEEDLE ASPIRATION:  - Negative for malignancy.  - Lymphoid tissue consistent with lymph node sampling.   LDCT  04/15/2024:   Lung-RADS 4X, highly suspicious. Additional imaging evaluation or consultation with  Pulmonology or Thoracic Surgery recommended. Interval development of right hilar mass measuring 5.8 x 4.1 cm, highly suspicious for primary lung malignancy. 2. New 10 mm subcarinal lymph node, suspicious for nodal metastasis. 3. New complete right middle lobe atelectasis and irregular consolidation in the anteroinferior right upper lobe, which may represent postobstructive atelectasis/pneumonia. 4. Increased diffuse interlobular septal thickening and moderate diffuse bronchial wall thickening, which may represent pulmonary edema. 5. Small right pleural effusion. 6. Aortic Atherosclerosis (ICD10-I70.0) and Emphysema (ICD10-J43.9). Coronary artery calcifications. Assessment for potential risk factor modification, dietary therapy or pharmacologic therapy may be warranted, if clinically indicated.    Latest Ref Rng & Units 04/25/2024    6:34 AM 01/30/2024    4:42 AM 01/29/2024    5:27 PM  CBC  WBC 4.0 - 10.5 K/uL 9.3  4.3  10.9   Hemoglobin 13.0 - 17.0 g/dL 87.0  85.0  85.0   Hematocrit 39.0 - 52.0 % 38.7  43.6  43.5   Platelets 150 - 400 K/uL 276  265  254        Latest Ref Rng & Units 04/25/2024    6:34 AM 01/30/2024    4:42 AM 01/29/2024    5:27 PM  BMP  Glucose 70 - 99 mg/dL 887  833  896   BUN 8 - 23 mg/dL 18  16  14    Creatinine 0.61 - 1.24 mg/dL 8.46  8.92  8.98   Sodium 135 - 145 mmol/L 136  141  141   Potassium 3.5 - 5.1 mmol/L 3.9  4.2  3.1   Chloride 98 - 111 mmol/L 99  107  105   CO2 22 - 32 mmol/L 25  24  24    Calcium  8.9 - 10.3 mg/dL 88.7  9.8  9.4     BNP    Component Value Date/Time   BNP 127.0 (H) 01/29/2024 1726    ProBNP No results found for: PROBNP  PFT No results found for: FEV1PRE, FEV1POST, FVCPRE, FVCPOST, TLC, DLCOUNC, PREFEV1FVCRT, PSTFEV1FVCRT  DG C-Arm 1-60 Min-No Report Result Date: 04/25/2024 Fluoroscopy was utilized by the requesting physician.  No radiographic interpretation.   CT CHEST LCS NODULE F/U LOW DOSE WO  CONTRAST Addendum Date: 04/21/2024 ADDENDUM REPORT: 04/21/2024 11:36 ADDENDUM: These results will be called to the ordering clinician or representative by the Radiologist Assistant, and communication documented in the PACS or [employer]. Electronically Signed   By: Limin  Xu M.D.   On: 04/21/2024 11:36   Result Date: 04/21/2024 CLINICAL DATA:  Current smoker with 89 pack-year smoking history. Follow-up Lung-RADS 3 EXAM: CT CHEST WITHOUT CONTRAST FOR LUNG CANCER SCREENING NODULE FOLLOW-UP TECHNIQUE: Multidetector CT imaging of the chest was performed following the standard protocol without IV contrast. RADIATION DOSE REDUCTION: This exam was performed according to the departmental dose-optimization program which includes automated exposure control, adjustment of the mA and/or kV according to patient size and/or use of iterative reconstruction technique. COMPARISON:  CT chest dated 10/16/2023 FINDINGS: Cardiovascular: Normal heart size. No significant pericardial fluid/thickening. Great vessels are normal in course and caliber. Coronary artery calcifications and aortic atherosclerosis. Mediastinum/Nodes: Imaged thyroid  gland without nodules meeting criteria for imaging follow-up by  size. Normal esophagus. New 10 mm subcarinal lymph node (2:35). Unchanged 10 mm precarinal lymph node (2:27). Partially calcified right hilar node is again seen, inseparable from the hilar mass described below. Lungs/Pleura: The central airways are patent. Severe paraseptal and centrilobular emphysema. Increased diffuse interlobular septal thickening and moderate diffuse bronchial wall thickening. New complete right middle lobe atelectasis and irregular consolidation in the anteroinferior right upper lobe. Interval development of right hilar mass measuring 5.8 x 4.1 cm (2:36). No pneumothorax. Small right pleural effusion. Upper abdomen: Cholecystectomy.  Calcified splenic granulomata. Musculoskeletal: No acute or abnormal lytic  or blastic osseous lesions. IMPRESSION: 1. Lung-RADS 4X, highly suspicious. Additional imaging evaluation or consultation with Pulmonology or Thoracic Surgery recommended. Interval development of right hilar mass measuring 5.8 x 4.1 cm, highly suspicious for primary lung malignancy. 2. New 10 mm subcarinal lymph node, suspicious for nodal metastasis. 3. New complete right middle lobe atelectasis and irregular consolidation in the anteroinferior right upper lobe, which may represent postobstructive atelectasis/pneumonia. 4. Increased diffuse interlobular septal thickening and moderate diffuse bronchial wall thickening, which may represent pulmonary edema. 5. Small right pleural effusion. 6. Aortic Atherosclerosis (ICD10-I70.0) and Emphysema (ICD10-J43.9). Coronary artery calcifications. Assessment for potential risk factor modification, dietary therapy or pharmacologic therapy may be warranted, if clinically indicated. Electronically Signed: By: Limin  Xu M.D. On: 04/21/2024 10:54   DG Chest 2 View Result Date: 04/05/2024 CLINICAL DATA:  pneumonia EXAM: CHEST - 2 VIEW COMPARISON:  03/24/2024 FINDINGS: Similar dense consolidation of the right middle lobe. Trace right pleural effusion. No pneumothorax. Biapical pleural thickening. No cardiomegaly. Tortuous aorta with aortic atherosclerosis. No acute fracture or destructive lesions. Multilevel thoracic osteophytosis. IMPRESSION: Similar dense consolidation of the right middle lobe, possibly representing pneumonia or atelectasis. Nonemergent chest CT with IV contrast recommended to exclude a central obstruction. Electronically Signed   By: Rogelia Myers M.D.   On: 04/05/2024 12:43     Past medical hx Past Medical History:  Diagnosis Date   Arthritis    Carotid artery stenosis without cerebral infarction    vascular --- dr eliza;   09-24-2007  s/p right CEA   Chronic cholecystitis    w/  large calcified stone in gallbladder   Chronic constipation     COPD (chronic obstructive pulmonary disease) (HCC)    followed by pcp----  (05-28-2021  pt stated last used rescue 05-27-2021 for wheezing resolved after use, stated exacerbation approx  greater than a year ago)   Coronary artery disease 2002   cardiologist-- dr lavona ;  (05-28-2021  pt denies need to use nitro for CP) per last cath 08-04-2017  LM luminal irregularities, LAD 30% ,D1 ostial 35% ,CFx   AV groove ostial 25% , OM1 long prox 30%,  proxRCA occluded and dominant,   excellent left to right collateral filling.   Depression    Patient denies   DOE (dyspnea on exertion)    due to smoking/ copd   Dyslipidemia    Essential hypertension    Full dentures    GERD (gastroesophageal reflux disease)    History of adenomatous polyp of colon    History of COVID-19    per pt winter 2022, mild to moderate symptoms that resolved   History of diverticulitis of colon    History of kidney stones    Hyperlipidemia    Hypothyroidism    followed by pcp   Lung nodule    s/p bronchoscopy w/ bx in 2005,  benign   Osteoporosis  Smokers' cough (HCC)    05-28-2021  pt stated occasionally productive w/ white phlegm   Wears glasses      Social History   Tobacco Use   Smoking status: Former    Current packs/day: 0.00    Average packs/day: 1 pack/day for 58.6 years (58.9 ttl pk-yrs)    Types: Cigarettes    Start date: 2025    Quit date: 01/29/2024    Years since quitting: 0.2   Smokeless tobacco: Never   Tobacco comments:    Quit smoking mid September 2025  Vaping Use   Vaping status: Never Used  Substance Use Topics   Alcohol use: Not Currently   Drug use: No    Mr.Zuver reports that he quit smoking about 2 months ago. His smoking use included cigarettes. He started smoking about 10 months ago. He has a 58.9 pack-year smoking history. He has never used smokeless tobacco. He reports that he does not currently use alcohol. He reports that he does not use drugs.  Tobacco  Cessation: Counseling given: Not Answered Tobacco comments: Quit smoking mid September 2025 Recently quit smoker, quit 01/2024 with a 60 pack year smoking history  Past surgical hx, Family hx, Social hx all reviewed.  Current Outpatient Medications on File Prior to Visit  Medication Sig   albuterol  (PROVENTIL ) (2.5 MG/3ML) 0.083% nebulizer solution Take 3 mLs (2.5 mg total) by nebulization every 4 (four) hours as needed for wheezing or shortness of breath.   apixaban (ELIQUIS) 5 MG TABS tablet Take 1 tablet (5 mg total) by mouth 2 (two) times daily.   Bempedoic Acid -Ezetimibe  (NEXLIZET ) 180-10 MG TABS TAKE ONE TABLET DAILY   budesonide -glycopyrrolate -formoterol  (BREZTRI  AEROSPHERE) 160-9-4.8 MCG/ACT AERO inhaler Inhale 2 puffs into the lungs in the morning and at bedtime.   fluticasone  (FLONASE ) 50 MCG/ACT nasal spray Place 2 sprays into both nostrils daily.   hydrOXYzine  (VISTARIL ) 25 MG capsule Take 1 capsule (25 mg total) by mouth every 8 (eight) hours as needed.   levothyroxine  (SYNTHROID ) 50 MCG tablet Take 1 tablet (50 mcg total) by mouth daily before breakfast.   nitroGLYCERIN  (NITROSTAT ) 0.4 MG SL tablet Place 1 tablet (0.4 mg total) under the tongue every 5 (five) minutes as needed for chest pain.   omeprazole  (PRILOSEC) 20 MG capsule Take 1 capsule (20 mg total) by mouth daily.   OXYGEN Inhale 2 L into the lungs daily.   traZODone  (DESYREL ) 100 MG tablet Take 1 tablet (100 mg total) by mouth at bedtime.   valsartan  (DIOVAN ) 160 MG tablet Take 1 tablet (160 mg total) by mouth daily.   No current facility-administered medications on file prior to visit.     Allergies  Allergen Reactions   Flomax  [Tamsulosin  Hcl] Other (See Comments)    Made patient hypotensive, clammy, sweaty, and feel like he was going to pass out (EMS had to be called)   Crestor [Rosuvastatin Calcium ] Other (See Comments)    Lethargy and muscle aches   Lipitor [Atorvastatin Calcium ] Other (See Comments)     Body cramps, lethargy, and muscle aches    Review Of Systems:  Constitutional:   +  weight loss, No night sweats,  Fevers, chills, + fatigue, or  lassitude.  HEENT:   No headaches,  Difficulty swallowing,  Tooth/dental problems, or  Sore throat,                No sneezing, itching, ear ache, nasal congestion, post nasal drip,   CV:  + chest pain,  No Orthopnea, PND, swelling in lower extremities, anasarca, dizziness, palpitations, syncope.   GI  No heartburn, indigestion, abdominal pain, nausea, vomiting, diarrhea, change in bowel habits, loss of appetite, bloody stools.   Resp: + shortness of breath with exertion or at rest.  + excess mucus, + productive cough,  No non-productive cough,  No coughing up of blood.  No change in color of mucus.  + wheezing.  No chest wall deformity  Skin: no rash or lesions.  GU: no dysuria, change in color of urine, no urgency or frequency.  No flank pain, no hematuria   MS:  No joint pain or swelling.  No decreased range of motion.  No back pain.  Psych:  No change in mood or affect. + depression or anxiety.  No memory loss. Appropriate mood and affect considering the diagnosis.   Vital Signs BP 136/68   Pulse (!) 102   Temp (!) 97.4 F (36.3 C)   Ht 5' 10 (1.778 m)   Wt 140 lb 9.6 oz (63.8 kg)   SpO2 91% Comment: 2l o2 poc  BMI 20.17 kg/m    Physical Exam:  General- No distress,  A&Ox3, pleasant, short of breath ENT: No sinus tenderness, TM clear, pale nasal mucosa, no oral exudate,no post nasal drip, no LAN Cardiac: S1, S2, regular rate and rhythm, no murmur Chest: + wheeze/ No rales/ dullness; + mild accessory muscle use with exertion, no nasal flaring, no sternal retractions Abd.: Soft Non-tender, ND, BS +, Body mass index is 20.17 kg/m.  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  Your biopsy of the right middle lobe was + for squamous cell cancer. This is a small cell lung cancer. I have made a referral to the Cancer Center in [place] at your request. I have referred you to both radiation oncology and medical oncology. You will get call to get the appointments. They will take good care of you . Good luck with treatment.  Continue wearing your oxygen at 2 L [place].  Saturation goal is always greater than 88%.  Continue Breztri , 2 puffs in the morning and 2 in the evening. Use albuterol  as needed for shortness of breath or wheezing.  Use Albuterol  nebs as you have been doing. PET and MR Brain to complete staging to be done at [facility] , we need to prioritize radiation oncology at present time. Please contact office for sooner follow up if symptoms do not improve or worsen or seek emergency care    I spent 30 minutes dedicated to the care of this patient on the date of this encounter to include pre-visit review of records, face-to-face time with the patient discussing conditions above, post visit ordering of testing, clinical documentation with the electronic health record, making appropriate referrals as documented, and communicating necessary information to the patient's healthcare team.      Lauraine JULIANNA Lites, NP 04/29/2024  11:58 AM

## 2024-04-29 NOTE — Patient Instructions (Addendum)
 It is good to see you today. I am glad you have recovered from your biopsies.  Your biopsy of the right middle lobe was + for squamous cell cancer. This is a small cell lung cancer. I have made a referral to the Cancer Center in [place] at your request. I have referred you to both radiation oncology and medical oncology. You will get call to get the appointments. They will take good care of you. Good luck with treatment.  Continue wearing your oxygen at 2 L [place].  Saturation goal is always greater than 88%.  Continue Breztri , 2 puffs in the morning and 2 in the evening. Use albuterol  as needed for shortness of breath or wheezing.  Use Albuterol  nebs as you have been doing. Please contact office for sooner follow up if symptoms do not improve or worsen or seek emergency care

## 2024-05-02 ENCOUNTER — Ambulatory Visit: Admitting: Acute Care

## 2024-05-02 ENCOUNTER — Telehealth: Payer: Self-pay | Admitting: Acute Care

## 2024-05-02 DIAGNOSIS — C342 Malignant neoplasm of middle lobe, bronchus or lung: Secondary | ICD-10-CM | POA: Insufficient documentation

## 2024-05-02 NOTE — Progress Notes (Signed)
 [facility], [facility]  Hematology Oncology Consultation  Initial Visit Note  Patient Name: [name] [name] Patient Age: 73 y.o. Encounter Date: 05/02/2024  Referring Physician:  Pcp, Unknown Per Patient No address on file  Primary Care Provider: Pcp, Unknown Per Patient  Consulting Provider: Mertie Bobette [name] [name]., MD  Hematology/Oncology  Reason for visit: Evaluation lung cancer.   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  He needs a PET scan and an MRI brain for staging.  Provided the disease has not spread he is a candidate for combined radiation therapy along with chemotherapy using likely weekly Taxol and Carboplatin. He will need a mediport for such therapy. He is in agreement with the plan.  If he responds well to it he will be a candidate for maintenance immunotherapy.  The purpose of therapy is curative but the prognosis is guarded in view of the extent of his disease and his poor lung condition.    Cancer Staging <redacted file path>  Malignant neoplasm of middle lobe of right lung (CMS-HCC) Staging form: Lung, AJCC V9 - Clinical stage from 05/02/2024: Stage IIIB (cT3, cN2b, cM0) - Signed by [name] [name] Bobette [name]., MD on 05/02/2024   I have reviewed the laboratory, pathology, and radiology reports in detail and discussed findings with the patient.  History of Present Illness:   [name] [name] is a 73 y.o. male who is seen in consultation at the request of Pcp, Unknown Per Patient for an evaluation of Non-Small Cell Lung Cancer.  The patient has been followed by Pulmonary Medicine for COPD and pulmonary nodules.  He is a former smoker.  CT chest in October 2025 showed severe emphysema and new complete right middle lobe atelectasis with irregular consolidation in the right upper lobe concerning for postobstructive  pneumonia as well as a new right hilar mass measuring 5.8 cm and a new 10 mm subcarinal lymph nodes suspicious for nodal metastasis.  He underwent bronchoscopy with biopsies on 04/25/2024.  He was found to have a right middle lobe mass as well as right hilar and mediastinal adenopathy.  The brushings and biopsy of the right middle lobe disclosed non-small cell carcinoma favor squamous cell carcinoma.  Lymph node biopsies (4L, 7 and 11R) were all negative.  Past Medical History[1]  Past Surgical History[2]   Family History[3]  Family Status  Relation Name Status  . [name] [name]  Deceased  . [name] [name]  Deceased  No partnership data on file   Social History   Occupational History  . Not on file  Tobacco Use  . Smoking status: Former    Current packs/day: 1.50    Average packs/day: 1.5 packs/day for 61.9 years (92.8 ttl pk-yrs)    Types: Cigarettes    Start date: 1964    Passive exposure: Current  . Smokeless tobacco: Never  Vaping Use  . Vaping status: Never Used  Substance and Sexual Activity  . Alcohol use: Never  . Drug use: Never  . Sexual activity: Yes    Partners: Male    Allergies[4]  Current Medications[5]  Review of Systems  Constitutional: Negative.   HENT:  Negative.    Eyes: Negative.   Respiratory:  Positive for shortness of breath.   Cardiovascular: Negative.   Gastrointestinal: Negative.   Endocrine: Negative.   Genitourinary: Negative.    Musculoskeletal: Negative.   Skin: Negative.  Neurological: Negative.   Hematological: Negative.      Vital Signs for this encounter: BSA: 1.74 meters squared BP 119/52   Pulse 67   Temp 36.7 C (98.1 F) (Temporal)   Resp 14   Ht 176.8 cm (5' 9.61)   Wt 61.4 kg (135 lb 6.4 oz)   SpO2 95%   BMI 19.65 kg/m   Physical Exam Constitutional:      Appearance: He is ill-appearing.     Comments: Frail. On home O2.  HENT:     Head: Normocephalic.     Nose: Nose normal.     Mouth/Throat:     Mouth: Mucous  membranes are moist.     Pharynx: Oropharynx is clear.  Eyes:     Conjunctiva/sclera: Conjunctivae normal.     Pupils: Pupils are equal, round, and reactive to light.  Pulmonary:     Breath sounds: Normal breath sounds.  Abdominal:     Palpations: Abdomen is soft.  Musculoskeletal:        General: Normal range of motion.     Cervical back: Neck supple.  Skin:    General: Skin is warm.  Neurological:     General: No focal deficit present.     Mental Status: He is alert.     Karnofsky/Lansky Performance Status 70, Cares for self; unable to carry on normal activity or to do active work (ECOG equivalent 1)   I spent 60 minutes reviewing the records and face-to-face with the patient today.   [name] Azure, MD            [1] Past Medical History: Diagnosis Date  . COPD (chronic obstructive pulmonary disease) (CMS-HCC)   . GERD (gastroesophageal reflux disease)   . Hyperlipidemia   . Hypertension   . Lung cancer    (CMS-HCC)   . Thyroid  disease   [2] No past surgical history on file. [3] Family History Problem Relation Age of Onset  . Cancer Paternal Aunt   . Cancer Paternal Aunt   [4] Allergies Allergen Reactions  . Atorvastatin Calcium  Other (See Comments)    Body cramps, lethargy and muscle aches  . Rosuvastatin Calcium  Other (See Comments)    Lethargy and muscle aches  . Tamsulosin  Hcl Other (See Comments)    Made patient hypotensive, clammy, sweaty, and feel like he was going to pass out (EMS HAD TO BE CALLED)  [5] Current Outpatient Medications  Medication Sig Dispense Refill  . albuterol  HFA 90 mcg/actuation inhaler INHALE 2 PUFFS EVERY 4 HOURS AS NEEDED FOR WHEEZING OR SHORTNESS OF BREATH    . albuterol  HFA 90 mcg/actuation inhaler Inhale 2 puffs every six (6) hours as needed. 8 g 0  . apixaban (ELIQUIS) 5 mg Tab Take 1 tablet (5 mg total) by mouth two (2) times a day.    . levothyroxine  (SYNTHROID ) 50 MCG tablet Take 1 tablet (50 mcg total) by  mouth daily.    . lisinopril  (PRINIVIL ,ZESTRIL ) 20 MG tablet Take 1 tablet (20 mg total) by mouth daily.    . methylPREDNISolone  (MEDROL  DOSEPACK) 4 mg tablet follow package directions 1 each 0  . NEXLIZET  180-10 mg Tab Take 1 tablet by mouth daily.    . nitroglycerin  (NITROSTAT ) 0.4 MG SL tablet Place 1 tablet (0.4 mg total) under the tongue every five (5) minutes as needed.    . omeprazole  (PRILOSEC) 20 MG capsule TAKE ONE CAPSULE ONCE DAILY    . LORazepam (ATIVAN) 0.5 MG tablet Take 1 tab by mouth  30 minutes prior to MRI 1 tablet 0   No current facility-administered medications for this visit.

## 2024-05-02 NOTE — Telephone Encounter (Signed)
 Copied from CRM [id number]. Topic: Clinical - Request for Lab/Test Order >> May 02, 2024 11:43 AM Devaughn RAMAN wrote: Reason for CRM: [place] with cancer center in [place] called and stated Dr.Morrison would like a call on his cell phone number below to discuss thoracentesis procedure and timing of PET scan for the pt.  Dr.morrison cell phone number-[phone]-radiation provider. Dr.Morrison would like a call from either Dr.Byrum,  Tammy Parrett or Lauraine Lites either provider is fine to call at anytime.

## 2024-05-03 ENCOUNTER — Other Ambulatory Visit (HOSPITAL_COMMUNITY): Payer: Self-pay | Admitting: Medical Oncology

## 2024-05-03 DIAGNOSIS — C342 Malignant neoplasm of middle lobe, bronchus or lung: Secondary | ICD-10-CM

## 2024-05-05 ENCOUNTER — Telehealth: Payer: Self-pay

## 2024-05-05 ENCOUNTER — Ambulatory Visit (HOSPITAL_COMMUNITY)
Admission: RE | Admit: 2024-05-05 | Discharge: 2024-05-05 | Disposition: A | Discharge status: Home / Self Care | Source: Ambulatory Visit | Attending: Medical Oncology | Admitting: Medical Oncology

## 2024-05-05 ENCOUNTER — Other Ambulatory Visit: Payer: Self-pay

## 2024-05-05 ENCOUNTER — Telehealth (HOSPITAL_BASED_OUTPATIENT_CLINIC_OR_DEPARTMENT_OTHER): Payer: Self-pay | Admitting: *Deleted

## 2024-05-05 DIAGNOSIS — R918 Other nonspecific abnormal finding of lung field: Secondary | ICD-10-CM | POA: Diagnosis not present

## 2024-05-05 DIAGNOSIS — C342 Malignant neoplasm of middle lobe, bronchus or lung: Secondary | ICD-10-CM | POA: Diagnosis not present

## 2024-05-05 MED ORDER — FLUDEOXYGLUCOSE F - 18 (FDG) INJECTION
7.4600 | Freq: Once | INTRAVENOUS | Status: AC | PRN
  Administered 2024-05-05: 7.46 via INTRAVENOUS

## 2024-05-05 NOTE — Telephone Encounter (Signed)
 Please discuss this with the patient and if he does decide that he wants to do the genetic testing then please place a referral to genetics

## 2024-05-05 NOTE — Telephone Encounter (Signed)
Patient declines genetic testing at this time. 

## 2024-05-05 NOTE — Progress Notes (Signed)
 The proposed treatment discussed in conference is for discussion purpose only and is not a binding recommendation.  The patients have not been physically examined, or presented with their treatment options.  Therefore, final treatment plans cannot be decided.

## 2024-05-05 NOTE — Telephone Encounter (Signed)
 Thoracic Tumor Board Review  [name] [name] was reviewed before presentation on the on the Thoracic Tumor Board on 05/05/2024. He has non-small cell carcinoma favor squamous cell carcinoma in right middle lobe.  Reyes Galley meets [employer] (NCCN) criteria for genetic testing based on his personal history of colon >10 polyps and his family history of colon cancer in his brother.   Surgical Pathology: Winston has completed 3 colonoscopies documented in the [facility] EHR and a total of 15 tubular adenomas/adenomas have been found. 2018: 5 tubular adenomas March 2023: 9 Adenomas Nov 2023: 1 tubular adenoma  Isaish Alemu Lindsey-Mills, MS, CGC  Certified Genetic Counselor  Email: [email or web address]  Phone: [phone]

## 2024-05-05 NOTE — Telephone Encounter (Signed)
   Pre-operative Risk Assessment    Patient Name: [name] [name]  DOB: [birth year] MRN: [id number]   Date of last office visit: 04/28/24 [name] CRATE, NP Date of next office visit: 06/09/24 [name] CRATE, NP 6 WEEK F/U   Request for Surgical Clearance    Procedure:  POWER PORT PLACEMENT  Date of Surgery:  Clearance 05/13/24                                Surgeon:  DR. JOESPH Surgeon's Group or Practice Name:  [facility] Phone number:  [phone] Fax number:  [phone]   Type of Clearance Requested:   - Medical  - Pharmacy:  Hold Apixaban (Eliquis) x 2 DAYS PRIOR   Type of Anesthesia:  MAC   Additional requests/questions:    [name]   05/05/2024, 12:59 PM

## 2024-05-06 NOTE — Telephone Encounter (Signed)
 I will update the requesting office to see notes per preop APP.

## 2024-05-06 NOTE — Telephone Encounter (Signed)
 [name] [name]. You recently saw this patient on 04/28/2024 at which time he was still having shortness of breath but this was felt to likely be due to COPD (he was also recently diagnosed with lung cancer). Limited Echo was ordered to reassess LV function and monitor was ordered to assess for a.fib burden. He now needs to have a port placed for chemo. This is currently scheduled for 05/13/2024. Is he okay to go ahead and have this done or would you recommend waiting until he has his Echo on on 05/12/2024 and his follow-up visit with you on 06/09/2024.  Please route response back to P CV DIV PREOP.  Thank you! Kaitrin Seybold

## 2024-05-06 NOTE — Telephone Encounter (Signed)
 Pharmacy, can you please provide recommendations for holding Eliquis  for upcoming procedure?  Thank you!

## 2024-05-06 NOTE — Telephone Encounter (Signed)
 Pre-op covering team, please see [name] [name]'s note below. Can you please notify surgeon's office that she recommend postponing procedure. [name] has ordered an Echo and monitor. Patient is scheduled to see [name] back in the office for follow-up on 06/09/2024 and she will address pre-op risk assessment then.  Will remove from pre-op pool.   Thank you!

## 2024-05-06 NOTE — Anesthesia Preprocedure Evaluation (Addendum)
 Procedure Information:  Date/Time: 05/13/24 1023   Procedure: INSERTION OF TUNNELED CENTRALLY INSERTED CENTRAL VENOUS ACCESS  DEVICE WITH SUBCUTANEOUS PORT >= 73 YRS OLD   Anesthesia type: Monitor Anesthesia Care   Pre-op diagnosis: Lung Cancer   Location: OR [facility] / OR [facility]   Surgeons: Celia Duwaine Sauer, MD     Anesthesia Evaluation     No history of anesthetic complications No family history of anesthetic complications  Airway  Mallampati: I TM distance: >3 FB Mouth Opening: >3 FB   Dental - normal exam     Pulmonary  (+) COPD, decreased breath sounds,    ROS comment: PET 05/05/24 Central Right Lung Mass (upper and middle Lobes) 5cm Obstruction Right Middle Bronchus with Secondary Collapse and Obstruction Centrilobular Emphysema Small Right Pleural Effusion (increased)  Squamous Cell Ca Right Lung 11/25 Stage IIIB, T3,N2,M0  Home 02 - 2L  Cardiovascular - normal exam (+) hypertension, past MI, CAD ,   ROS comment: Atrial Fibrillation following Bronchoscopy with EBUS 11/25  Nuclear Stress 7/25 No Current Ischemia Large Severe Inferior/Inferoapical Fixed Defect No ST deviation noted  EKG- T wave Inversion II,III,aVF, V5,V6 Chest Pain at minute 1 to minute 4 Arrhythmias during stress: PVCs, sinus tachycardia                                            PVC increased to trigeminy  Echo 01/01/24 LVEF 55-60% Normal LV Function Normal Valves Structure and Function  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    Neuro/Psych    Comments: Right CEA Carotid US  1/24 Right 1-39% Stenosis Left    40-59% Stenosis Normal Flow Dynamic Vertebral and Subclavian Arteries  CT 7/25 Areas of Hypoattenuation Left- Posterior Frontal, Parietal, Occipital  Mild Parenchymal Volume Loss Intracranial Atherosclerosis    GI/Hepatic/Renal   (+) GERDComments: PET 11/25 Diffuse low level hypermetabolism Prostate (mild Prostatomegaly) Punctate Right Renal Collecting  System Calculus Colonic Diverticula    Endo/Other   (+) hypothyroidism  Abdominal  - normal exam  OB/GYN      HEENT   (+)    Comments: PET 11/25 Right Paratracheal LN 1.2cm  CT 2024 Left Frontal Osteoma Right Frontal, Ethmoid, Left Sphenoid Sinuses mucosal thickening and retention cysts  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 Yes  Anesthetic:  MAC  Standard lines and monitors.   Airway: native airway            OSA Risk Factor Score: 3                               (Plan for MAC with local.)  Anesthesia plan and risk discussed with patient; informed consent obtained.     Plan discussed with CRNA.    Relevant Problems  ANESTHESIA  (+) Atrial fibrillation, new onset (CMS-HCC)  (+) Bilateral carotid artery stenosis  (+) COPD (chronic obstructive pulmonary disease) (CMS-HCC)  (+) Coronary artery disease involving native coronary artery of native heart with angina pectoris  (+) Essential hypertension  (+) GERD (gastroesophageal reflux disease)

## 2024-05-09 DIAGNOSIS — R9431 Abnormal electrocardiogram [ECG] [EKG]: Secondary | ICD-10-CM | POA: Diagnosis not present

## 2024-05-09 NOTE — Telephone Encounter (Signed)
 Brandy calling again to request the accurate clearance be refaxed to their office. She states they have received two different messages and are not sure which one is right. Please advise.

## 2024-05-09 NOTE — Telephone Encounter (Signed)
 Patient with diagnosis of A Fib on Eliquis for anticoagulation.    Procedure: POWER PORT PLACEMENT  Date of procedure: 05/13/24   CHA2DS2-VASc Score = 3  This indicates a 3.2% annual risk of stroke. The patient's score is based upon: CHF History: 0 HTN History: 1 Diabetes History: 0 Stroke History: 0 Vascular Disease History: 1 Age Score: 1 Gender Score: 0     CrCl 36 ml.min Platelet count 276K  Patient has not had an Afib/aflutter ablation in the last 3 months, DCCV within the last 4 weeks or a watchman implanted in the last 45 days    Per office protocol, patient can hold Eliquis for 2 days prior to procedure.  .  **This guidance is not considered finalized until pre-operative APP has relayed final recommendations.**

## 2024-05-09 NOTE — Telephone Encounter (Signed)
 Johney Latus sent secure chat. I added preop APP onto chat as well.  Josefa BROCKS, FNP: Genna, so it looks like Callie reached out to [place].  [name] wants an echo and to see him back in the office. His echo has been scheduled as well as his follow-up appointment.  Me: Latus are you able to relay the recommendations about echo abd f/u appt or do you need me to let them know.   Shana scheduler: I can defiantly call her and let her know that the patient will need that 1st before he can be cleared  I have called them and let them know.  I got you Niels!!  thank you

## 2024-05-09 NOTE — Telephone Encounter (Signed)
 Brandy requesting copy of clearance faxed to her. [phone].

## 2024-05-10 ENCOUNTER — Telehealth: Payer: Self-pay

## 2024-05-10 DIAGNOSIS — C3491 Malignant neoplasm of unspecified part of right bronchus or lung: Secondary | ICD-10-CM | POA: Diagnosis not present

## 2024-05-10 DIAGNOSIS — C342 Malignant neoplasm of middle lobe, bronchus or lung: Secondary | ICD-10-CM | POA: Diagnosis not present

## 2024-05-10 NOTE — Telephone Encounter (Signed)
 Brittany from radiation requesting a c/b

## 2024-05-10 NOTE — Telephone Encounter (Signed)
 I sw/ Brittany in oncology radiation. She was confirming if the pt was cleared yet. I stated not yet. Pt was ordered to have an echo by [name] Crate, NP and a f/u appt once echo has been read.    I stated the echo is today and pt f/u tomorrow with [name] Crate, NP. Brittany just wanted to confirm as from pharm-d notes wasn't sure if that was the clearance. [name] Crate, NP once she follows up with the pt tomorrow she will let the office know if the pt has been cleared.   Brittany stated the pt is scheduled to have a thoracentesis this Thursday. She will update her doctor as well to our conversation.

## 2024-05-10 NOTE — Telephone Encounter (Signed)
 CORRECTION ON MY PREVIOUS NOTES: pt echo is 05/12/24 not today and his f/u appt is 05/13/24 with [name] Crate, NP. Please accept my apologies.

## 2024-05-10 NOTE — Telephone Encounter (Signed)
 Called by Dr. Dannielle, Patients NSCLC MD, for questions about pre-procedure risk assessment.  [name] [name], with coronary artery disease and COPD, is pending an diagnosis thoracentesis on Thursday for NSCLC cytology.  He has a history of coronary artery disease and mixed carotid disease. He has been started on anticoagulation monotherapy due to potential atrial fibrillation, which was diagnosed around the time of the bronchoscopy. An echocardiogram is pending prior to port placement.  He has significant COPD. He requires oxygen therapy. Staging with this procedure will decide his therapy.. A PET study did not suggest any other involvement.  Non-small cell lung cancer - Undergoing evaluation for potential malignancy with a diagnostic thoracentesis planned to assess for malignant involvement. PET study not suggestive of other involvement. Procedure part of oncological care to better understand malignancy and guide treatment. - we discussed that this patient is moderate risk for a low risk procedure.  CHADSVASC 3 at least (Age, CAD, HF) - Dr. Dannielle notes this is not an elective procedure; completion of this will have significant repercussions on his cancer care -After reviewing pros and cons, it may be reasonable for patient to get this procedure; discussed his stroke risk off of AC.  This is a CV risk procedure (thoracentesis) - patient is planning to proceed - reviewed care with Dr. Dannielle who has no additional questions or concerns.  Will route to primary cardiologist and team.  [name] Leavens, MD FASE [facility] Cardiologist [facility]  [address] [place], [place] [address] [phone]  12:19 PM

## 2024-05-10 NOTE — Telephone Encounter (Signed)
 Received a DOD phone call.  Doctor requesting clarification on pt's surgical clearance.

## 2024-05-12 ENCOUNTER — Other Ambulatory Visit: Payer: Self-pay | Admitting: Family Medicine

## 2024-05-12 ENCOUNTER — Ambulatory Visit: Admitting: Adult Health

## 2024-05-12 ENCOUNTER — Ambulatory Visit

## 2024-05-12 DIAGNOSIS — J90 Pleural effusion, not elsewhere classified: Secondary | ICD-10-CM | POA: Diagnosis not present

## 2024-05-12 DIAGNOSIS — F419 Anxiety disorder, unspecified: Secondary | ICD-10-CM

## 2024-05-12 DIAGNOSIS — C342 Malignant neoplasm of middle lobe, bronchus or lung: Secondary | ICD-10-CM | POA: Diagnosis not present

## 2024-05-13 ENCOUNTER — Ambulatory Visit: Admitting: Nurse Practitioner

## 2024-05-13 ENCOUNTER — Ambulatory Visit
Admission: RE | Admit: 2024-05-13 | Discharge: 2024-05-13 | Disposition: A | Discharge status: Home / Self Care | Source: Ambulatory Visit | Attending: Nurse Practitioner | Admitting: Nurse Practitioner

## 2024-05-13 ENCOUNTER — Encounter: Payer: Self-pay | Admitting: Nurse Practitioner

## 2024-05-13 VITALS — BP 100/60 | HR 104 | Ht 70.0 in | Wt 134.6 lb

## 2024-05-13 DIAGNOSIS — J449 Chronic obstructive pulmonary disease, unspecified: Secondary | ICD-10-CM

## 2024-05-13 DIAGNOSIS — E785 Hyperlipidemia, unspecified: Secondary | ICD-10-CM | POA: Insufficient documentation

## 2024-05-13 DIAGNOSIS — I2510 Atherosclerotic heart disease of native coronary artery without angina pectoris: Secondary | ICD-10-CM

## 2024-05-13 DIAGNOSIS — C342 Malignant neoplasm of middle lobe, bronchus or lung: Secondary | ICD-10-CM | POA: Diagnosis not present

## 2024-05-13 DIAGNOSIS — I10 Essential (primary) hypertension: Secondary | ICD-10-CM | POA: Insufficient documentation

## 2024-05-13 DIAGNOSIS — I6529 Occlusion and stenosis of unspecified carotid artery: Secondary | ICD-10-CM | POA: Insufficient documentation

## 2024-05-13 DIAGNOSIS — R0602 Shortness of breath: Secondary | ICD-10-CM | POA: Diagnosis present

## 2024-05-13 DIAGNOSIS — I4891 Unspecified atrial fibrillation: Secondary | ICD-10-CM

## 2024-05-13 DIAGNOSIS — Z01810 Encounter for preprocedural cardiovascular examination: Secondary | ICD-10-CM

## 2024-05-13 LAB — ECHOCARDIOGRAM LIMITED
Height: 70 in
S' Lateral: 3 cm
Weight: 2153.6 [oz_av]

## 2024-05-13 MED ORDER — VALSARTAN 80 MG PO TABS: 80.0000 mg | ORAL_TABLET | Freq: Every day | ORAL | 6 refills | Status: DC

## 2024-05-13 MED ORDER — PERFLUTREN LIPID MICROSPHERE
1.0000 mL | INTRAVENOUS | Status: AC | PRN
  Administered 2024-05-13: 2 mL via INTRAVENOUS

## 2024-05-13 NOTE — Progress Notes (Addendum)
 Cardiology Office Note   Date: 05/13/2024 ID:  [name] [name], DOB January 04, 1951, MRN [id number] PCP: Dettinger, [name], MD  [facility] Providers Cardiologist:  Lynwood Schilling, MD     History of Present Illness [name] [name] is a 73 y.o. male with a PMH of CAD, mixed HLD, HTN, hx of carotid artery stenosis, s/p right CEA, and tobacco use, and COPD, who presents today for follow-up.   Last seen by Dr. Schilling on Oct 29, 2022. Was overall doing well at the time. Was referred [facility] for a PCSK9i and started on Norvasc  5 mg daily.   Recent hospitalization July 2025 due to atypical CP. Was scheduled for OP Lexiscan . Echo normal. See most recent results of OP Lexiscan  below.   01/29/2024 - He is here for follow-up. He states he is not doing well at all for the past 2 weeks. Mentions worsening shortness of breath and dyspea on exertion at rest. Has been so short of breath recently, says he's been having panic attacks because of it and says he strongly believes he needs to go to the ED to get help. Said he had a bad panic attack last night. Admits to DOE with minimal exertion and even DOE at rest. Denies any chest pain, palpitations, syncope, presyncope, dizziness, orthopnea, PND, swelling or significant weight changes, acute bleeding, or claudication.  She has saw pulmonology yesterday.  Lisinopril  was stopped and switched to valsartan  under 160 mg daily.  Working on getting him qualified for oxygen  and inhaler was switched.  He says he checked his SpO2 this morning on his pulse ox on room air, was 82%.  He was short of breath at that time when he checked it.  Does tell me that pulmonology gave an order for oxygen , but says it has not come in yet.  Has been also having issues with his abdomen, admits to feeling of being bloated/also noticing weight loss/constipation.  He was in the hospital in August 2025 due to acute respiratory failure with hypoxia and community-acquired  pneumonia of right lung.  Treated with antibiotics, mucolytic's and bronchodilators.  Discharged on antibiotics.  02/26/2024 -today he is here for follow-up with his wife.  He tells me he now has his oxygen , currently not wearing it today for office visit.  SpO2 today during vital signs is 99% on room air.  Tells me he is feeling better since leaving the hospital.  Does experience chest tightness when he has shortness of breath. Denies any chest pain, shortness of breath, palpitations, syncope, presyncope, dizziness, orthopnea, PND, swelling or significant weight changes, acute bleeding, or claudication.  04/28/2024 -  Here for follow-up with his wife. Continues to wear chronic O2. Does have a current cough, says sometimes coughs up trace of blood. Denies any other bleeding issues. Wife says MD believes his A-fib was caused by his recent procedure. Denies any chest pain, worsening shortness of breath, palpitations, syncope, presyncope, dizziness, orthopnea, PND, swelling or significant weight changes, acute bleeding, or claudication.  05/13/2024 - Here for follow-up. Continues to wear chronic O2. No change to his symptoms since last office visit. He denies any bleeding issues since being on Eliquis . Tolerating medication well. Breathing is stable per his report. Denies any chest pain, palpitations, syncope, presyncope, dizziness, orthopnea, PND, swelling or significant weight changes, acute bleeding, or claudication. Just finished wearing his monitor and has recently mailed this back in. Scheduled for a limited Echo to be done today. He is pending  insertion of central line on 05/24/2024.   ROS: Negative.  See HPI.  Studies Reviewed  EKG: EKG is not ordered today.   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 The study is intermediate risk. Risk based on decreased LVEF, there is no current myocardium at jeopardy. Consider correlating LVEF with echo.   No ST  deviation was noted.   LV perfusion is abnormal. Large severe intensity inferior/inferoapical defect that is fixed. Defect complicated by adjacent gut radiotracer uptake, however wall motion abnormality would support prior infarct. No current ischemia.   Left ventricular function is abnormal. Nuclear stress EF: 45%. The left ventricular ejection fraction is mildly decreased (45-54%). End diastolic cavity size is normal.  Echocardiogram 12/2023:  1. Challenging study due to COPD. Poor Echo images. Limited interrogation  of valves.   2. Left ventricular ejection fraction, by estimation, is 55 to 60%. The  left ventricle has normal function. The left ventricle has no regional  wall motion abnormalities. Left ventricular diastolic parameters are  consistent with Grade I diastolic  dysfunction (impaired relaxation).   3. Right ventricular systolic function is normal. The right ventricular  size is normal. There is normal pulmonary artery systolic pressure.   4. The mitral valve was not well visualized. No evidence of mitral valve  regurgitation. No evidence of mitral stenosis.   5. The aortic valve was not well visualized. Aortic valve regurgitation  is not visualized. No aortic stenosis is present.   6. The inferior vena cava is normal in size with greater than 50%  respiratory variability, suggesting right atrial pressure of 3 mmHg.  Carotid duplex 06/2022: Summary:  Right Carotid: Velocities in the right ICA are consistent with a 1-39%  stenosis.   Left Carotid: Velocities in the left ICA are consistent with a 40-59%  stenosis.   Vertebrals: Bilateral vertebral arteries demonstrate antegrade flow.  Subclavians: Normal flow hemodynamics were seen in bilateral subclavian               arteries.   *See table(s) above for measurements and observations.  Suggest follow up study in 12 months.  AAA 06/2021:  Summary:  Abdominal Aorta: There is evidence of abnormal ectasia of the distal   Abdominal aorta. The largest aortic measurement is 2.5 cm. The largest  aortic diameter remains essentially unchanged compared to prior exam.  Previous diameter measurement was 2.5 cm  obtained on 02/11/16.  Stenosis: +--------------------+-------------+------------+  Location            Stenosis     Comments      +--------------------+-------------+------------+  Right External Iliac>50% stenosismild/low-end  +--------------------+-------------+------------+      IVC/Iliac: There is no evidence of thrombus involving the IVC.    *See table(s) above for measurements and observations.  Suggest follow up study in 24 months.  LHC 07/2017:  Prox RCA to Mid RCA lesion is 100% stenosed. Ost Cx to Prox Cx lesion is 60% stenosed. Ost 1st Mrg lesion is 40% stenosed. Mid LAD to Dist LAD lesion is 50% stenosed. Dist LM to Prox LAD lesion is 20% stenosed. The left ventricular systolic function is normal. LV end diastolic pressure is normal. The left ventricular ejection fraction is 55-65% by visual estimate. There is no mitral valve regurgitation.   1. Chronic total occlusion of the large, dominant mid RCA with filling of the distal RCA from right to right bridging collaterals and left to right collaterals.  2. The LAD is a large caliber vessel that  courses to the apex. The mid to distal LAD has a moderate stenosis. This does not appear to be flow limiting. The first diagonal branch is moderate in caliber and has a moderate stenosis.  3. The Circumflex artery is a moderate caliber vessel that supplies two OM branches. There is a moderate stenosis in the proximal Circumflex extending back to the ostium. This is looked at closely in multiple views and does not appear to be flow limiting. This involves a moderate caliber first OM branch which has moderate ostial stenosis.  4. Normal LV systolic function   Recommendations: I would continue with medical management of his CAD. Consider addition  of long acting nitrates. Tobacco cessation is recommended.   Physical Exam VS:  BP 100/60   Pulse (!) 104   Ht 5' 10 (1.778 m)   Wt 134 lb 9.6 oz (61.1 kg)   SpO2 93%   BMI 19.31 kg/m        Wt Readings from Last 3 Encounters:  05/13/24 134 lb 9.6 oz (61.1 kg)  04/29/24 140 lb 9.6 oz (63.8 kg)  04/28/24 138 lb 6.4 oz (62.8 kg)    GEN: Thin, 73 year old male in acute distress, wearing chronic O2 NECK: No JVD; No carotid bruits CARDIAC: S1/S2, RRR, no murmurs, rubs, gallops RESPIRATORY:  Diminished to auscultation with labored work of breathing, tachypnea  ABDOMEN: Soft, non-tender, non-distended EXTREMITIES:  No edema; No deformity   ASSESSMENT AND PLAN  Pre-operative cardiovascular risk assessment This has already been addressed by Dr. Santo as written in telephone note dated 05/10/2024. See chart. Per office protocol, patient can hold Eliquis  for 2 days prior to procedure.    Mr. Lawn's perioperative risk of a major cardiac event is 6.6% according to the Revised Cardiac Risk Index (RCRI).  Therefore, he is at high risk for perioperative complications.   His functional capacity is limited d/t his COPD.  Recommendations: The patient is at high risk for perioperative cardiac complications and is at a low functional capacity.  However, further testing will not change how his cardiac status is managed.  Proceed with surgery at high risk if there are no other options for treatment. Antiplatelet and/or Anticoagulation Recommendations: Per office protocol, patient can hold Eliquis  for 2 days prior to procedure.   A-fib Had a past episode most likely triggered by his recent procedure. HR is slightly fast today, medical therapy limited by his current BP trends. Monitor is currently pending. Will reduce valsartan  to 80 mg daily to give room to add BB if needed in the future. He is also pending limited Echo today. Continue Eliquis  for stroke prevention. Discussed avoiding  triggers to A-fib. Care and ED precautions discussed.   CAD Stable with no anginal symptoms. No indication for ischemic evaluation. 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  Recent echo showed normal LVEF.   Care and ED precautions discussed. Updating limited Echo that is being done today as noted above.   HLD, carotid artery stenosis LDL 61 from April 2025.  Continue current medication regimen.  Carotid duplex from last year showed no significant stenosis along right carotid with moderate left carotid artery stenosis, recommended to repeat testing in 1 year.  Not addressed, but will consider repeating this at next office visit.  Heart healthy diet and regular cardiovascular exercise encouraged.   HTN BP soft today.  Reducing valsartan  as noted above. Discussed to monitor BP at home at least 2 hours  after medications and sitting for 5-10 minutes. Heart healthy diet and regular cardiovascular exercise encouraged. He will update me regarding his BP readings in 3 weeks.   4. COPD, shortness of breath Breathing is stable, strongly feel that is occasional SHOB is related to his COPD. Getting limited Echo performed today as noted above. I congratulated him on quitting smoking.  Continue follow-up with pulmonology who is managing this.  Care and ED precautions discussed.   Dispo: Follow-up with MD/APP in 4-6 weeks or sooner if any changes.  Signed, [name] Crate, NP

## 2024-05-13 NOTE — Progress Notes (Deleted)
 Cardiology Office Note   Date: 04/28/2024 ID:  [name] [name], DOB 30-Apr-1951, MRN [id number] PCP: Dettinger, [name], MD  [facility] Providers Cardiologist:  Lynwood Schilling, MD     History of Present Illness [name] [name] is a 73 y.o. male with a PMH of CAD, mixed HLD, HTN, hx of carotid artery stenosis, s/p right CEA, and tobacco use, and COPD, who presents today for hospital follow-up.   Last seen by Dr. Schilling on Oct 29, 2022. Was overall doing well at the time. Was [facility] for a PCSK9i and started on Norvasc  5 mg daily.   Recent hospitalization July 2025 due to atypical CP. Was scheduled for OP Lexiscan . Echo normal. See most recent results of OP Lexiscan  below.   01/29/2024 - He is here for follow-up. He states he is not doing well at all for the past 2 weeks. Mentions worsening shortness of breath and dyspea on exertion at rest. Has been so short of breath recently, says he's been having panic attacks because of it and says he strongly believes he needs to go to the ED to get help. Said he had a bad panic attack last night. Admits to DOE with minimal exertion and even DOE at rest. Denies any chest pain, palpitations, syncope, presyncope, dizziness, orthopnea, PND, swelling or significant weight changes, acute bleeding, or claudication.  She has saw pulmonology yesterday.  Lisinopril  was stopped and switched to valsartan  under 160 mg daily.  Working on getting him qualified for oxygen  and inhaler was switched.  He says he checked his SpO2 this morning on his pulse ox on room air, was 82%.  He was short of breath at that time when he checked it.  Does tell me that pulmonology gave an order for oxygen , but says it has not come in yet.  Has been also having issues with his abdomen, admits to feeling of being bloated/also noticing weight loss/constipation.  He was in the hospital in August 2025 due to acute respiratory failure with hypoxia and  community-acquired pneumonia of right lung.  Treated with antibiotics, mucolytic's and bronchodilators.  Discharged on antibiotics.  02/26/2024 -today he is here for follow-up with his wife.  He tells me he now has his oxygen , currently not wearing it today for office visit.  SpO2 today during vital signs is 99% on room air.  Tells me he is feeling better since leaving the hospital.  Does experience chest tightness when he has shortness of breath. Denies any chest pain, shortness of breath, palpitations, syncope, presyncope, dizziness, orthopnea, PND, swelling or significant weight changes, acute bleeding, or claudication.  04/28/2024 -  Here for follow-up with his wife. Continues to wear chronic O2. Does have a current cough, says sometimes coughs up trace of blood. Denies any other bleeding issues. Wife says MD believes his A-fib was caused by his recent procedure. Denies any chest pain, worsening shortness of breath, palpitations, syncope, presyncope, dizziness, orthopnea, PND, swelling or significant weight changes, acute bleeding, or claudication.  ROS: Negative.  See HPI.  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 The study is intermediate risk. Risk based on decreased LVEF, there is no current myocardium at jeopardy. Consider correlating LVEF with echo.   No ST deviation was noted.   LV perfusion is abnormal. Large severe intensity inferior/inferoapical defect that is fixed. Defect complicated by adjacent gut radiotracer uptake, however wall  motion abnormality would support prior infarct. No current ischemia.   Left ventricular function is abnormal. Nuclear stress EF: 45%. The left ventricular ejection fraction is mildly decreased (45-54%). End diastolic cavity size is normal.  Echocardiogram 12/2023:  1. Challenging study due to COPD. Poor Echo images. Limited interrogation  of valves.   2. Left ventricular ejection  fraction, by estimation, is 55 to 60%. The  left ventricle has normal function. The left ventricle has no regional  wall motion abnormalities. Left ventricular diastolic parameters are  consistent with Grade I diastolic  dysfunction (impaired relaxation).   3. Right ventricular systolic function is normal. The right ventricular  size is normal. There is normal pulmonary artery systolic pressure.   4. The mitral valve was not well visualized. No evidence of mitral valve  regurgitation. No evidence of mitral stenosis.   5. The aortic valve was not well visualized. Aortic valve regurgitation  is not visualized. No aortic stenosis is present.   6. The inferior vena cava is normal in size with greater than 50%  respiratory variability, suggesting right atrial pressure of 3 mmHg.  Carotid duplex 06/2022: Summary:  Right Carotid: Velocities in the right ICA are consistent with a 1-39%  stenosis.   Left Carotid: Velocities in the left ICA are consistent with a 40-59%  stenosis.   Vertebrals: Bilateral vertebral arteries demonstrate antegrade flow.  Subclavians: Normal flow hemodynamics were seen in bilateral subclavian               arteries.   *See table(s) above for measurements and observations.  Suggest follow up study in 12 months.  AAA 06/2021:  Summary:  Abdominal Aorta: There is evidence of abnormal ectasia of the distal  Abdominal aorta. The largest aortic measurement is 2.5 cm. The largest  aortic diameter remains essentially unchanged compared to prior exam.  Previous diameter measurement was 2.5 cm  obtained on 02/11/16.  Stenosis: +--------------------+-------------+------------+  Location            Stenosis     Comments      +--------------------+-------------+------------+  Right External Iliac>50% stenosismild/low-end  +--------------------+-------------+------------+      IVC/Iliac: There is no evidence of thrombus involving the IVC.    *See table(s) above  for measurements and observations.  Suggest follow up study in 24 months.  LHC 07/2017:  Prox RCA to Mid RCA lesion is 100% stenosed. Ost Cx to Prox Cx lesion is 60% stenosed. Ost 1st Mrg lesion is 40% stenosed. Mid LAD to Dist LAD lesion is 50% stenosed. Dist LM to Prox LAD lesion is 20% stenosed. The left ventricular systolic function is normal. LV end diastolic pressure is normal. The left ventricular ejection fraction is 55-65% by visual estimate. There is no mitral valve regurgitation.   1. Chronic total occlusion of the large, dominant mid RCA with filling of the distal RCA from right to right bridging collaterals and left to right collaterals.  2. The LAD is a large caliber vessel that courses to the apex. The mid to distal LAD has a moderate stenosis. This does not appear to be flow limiting. The first diagonal branch is moderate in caliber and has a moderate stenosis.  3. The Circumflex artery is a moderate caliber vessel that supplies two OM branches. There is a moderate stenosis in the proximal Circumflex extending back to the ostium. This is looked at closely in multiple views and does not appear to be flow limiting. This involves a moderate caliber first OM branch which  has moderate ostial stenosis.  4. Normal LV systolic function   Recommendations: I would continue with medical management of his CAD. Consider addition of long acting nitrates. Tobacco cessation is recommended.   Physical Exam VS:  BP 100/60   Pulse (!) 104   Ht 5' 10 (1.778 m)   Wt 134 lb 9.6 oz (61.1 kg)   SpO2 93%   BMI 19.31 kg/m        Wt Readings from Last 3 Encounters:  05/13/24 134 lb 9.6 oz (61.1 kg)  04/29/24 140 lb 9.6 oz (63.8 kg)  04/28/24 138 lb 6.4 oz (62.8 kg)    GEN: Thin, 73 year old male in acute distress NECK: No JVD; No carotid bruits CARDIAC: S1/S2, RRR, no murmurs, rubs, gallops RESPIRATORY:  Diminished to auscultation with labored work of breathing, tachypnea  ABDOMEN: Soft,  non-tender, non-distended EXTREMITIES:  No edema; No deformity   ASSESSMENT AND PLAN  A-fib Had a recent episode most likely triggered by his recent procedure. EKG confirms his in in ST today. Will arrange limited Echo to evaluate his EF and monitor to see if he has any recurrence of his A-fib.  Due to CHA2DS2-VASc score 3, will stop aspirin  and begin Eliquis  5 mg BID and obtain CBC and BMET in 1 week.  Discussed avoiding triggers to A-fib. Care and ED precautions discussed.   CAD Stable with no anginal symptoms. No indication for ischemic evaluation. 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  Recent echo showed normal LVEF.   Care and ED precautions discussed. Updating limited Echo as noted above.   HLD, carotid artery stenosis LDL 61 from April 2025.  Continue current medication regimen.  Carotid duplex from last year showed no significant stenosis along right carotid with moderate left carotid artery stenosis, recommended to repeat testing in 1 year.  Not addressed, but will consider repeating this at next office visit.  Heart healthy diet and regular cardiovascular exercise encouraged.   HTN BP stable.  Continue current medication regimen. Discussed to monitor BP at home at least 2 hours after medications and sitting for 5-10 minutes. Heart healthy diet and regular cardiovascular exercise encouraged.   4. COPD, shortness of breath Does admit to some shortness of breath at times - will arrange limited Echo as noted above, felt to be related to his COPD.  I congratulated him on quitting smoking.  Continue follow-up with pulmonology who is managing this.  Care and ED precautions discussed.    BP and HR log...SABRA 3 weeks after port a cath is placed Wait for monitor report as well as the Echo.  Decrease Valsartan  to 80 mg daily.  F/u in 4-6 weeks.   Dispo: Follow-up with MD/APP in 6 weeks or sooner if any changes.  Signed, [name] Crate, NP

## 2024-05-13 NOTE — Patient Instructions (Signed)
 Medication Instructions:   Decrease Valsartan  to 80mg  daily  Continue all other medications.     Labwork:  none  Testing/Procedures:  none  Follow-Up:  4-6 weeks    Any Other Special Instructions Will Be Listed Below (If Applicable).  BP log x 3 weeks   If you need a refill on your cardiac medications before your next appointment, please call your pharmacy.

## 2024-05-14 DIAGNOSIS — C342 Malignant neoplasm of middle lobe, bronchus or lung: Secondary | ICD-10-CM | POA: Diagnosis not present

## 2024-05-17 ENCOUNTER — Ambulatory Visit: Payer: Self-pay | Admitting: Nurse Practitioner

## 2024-05-17 ENCOUNTER — Other Ambulatory Visit: Payer: Self-pay | Admitting: Nurse Practitioner

## 2024-05-17 ENCOUNTER — Telehealth: Payer: Self-pay | Admitting: Pharmacist

## 2024-05-17 DIAGNOSIS — I4891 Unspecified atrial fibrillation: Secondary | ICD-10-CM

## 2024-05-17 NOTE — Telephone Encounter (Signed)
 Patient with diagnosis of A Fib on Eliquis  for anticoagulation.    Procedure: port a cath insertion.  Date of procedure: 05/24/24   CHA2DS2-VASc Score = 3  This indicates a 3.2% annual risk of stroke. The patient's score is based upon: CHF History: 0 HTN History: 1 Diabetes History: 0 Stroke History: 0 Vascular Disease History: 1 Age Score: 1 Gender Score: 0    CrCl 32 ml/min Platelet count 320K  Patient has not had an Afib/aflutter ablation in the last 3 months, DCCV within the last 4 weeks or a watchman implanted in the last 45 days   Per office protocol, patient can hold Eliquis  for 2 days prior to procedure.   **This guidance is not considered finalized until pre-operative APP has relayed final recommendations.**

## 2024-05-17 NOTE — Telephone Encounter (Signed)
 Eliquis  5mg  refill request received. Patient is 73 years old, weight-61.1kg, Crea-1.53 on 04/25/24, Diagnosis-Afib, and last seen by [name] Crate on 05/13/24. Dose is appropriate based on dosing criteria. Will send in refill to requested pharmacy.

## 2024-05-17 NOTE — Telephone Encounter (Signed)
-----   Message from [name] Crate sent at 05/17/2024 11:22 AM EST ----- Scheduled for upcoming port a cath insertion. Currently on Eliquis . How long should pt hold Eliquis  prior to insertion?  Thanks!  Best, [name] Crate, NP

## 2024-05-18 NOTE — Telephone Encounter (Signed)
   Patient Name: OCTAVIA MOTTOLA  DOB: Oct 28, 1950 MRN: [id number]  Primary Cardiologist: Lynwood Schilling, MD  Chart reviewed as part of pre-operative protocol coverage.  Patient was recently seen in clinic on 05/13/2024 by [name] Crate, NP.  Please see risk assessment below per her notes.  Mr. Taddei's perioperative risk of a major cardiac event is 6.6% according to the Revised Cardiac Risk Index (RCRI).  Therefore, he is at high risk for perioperative complications.   His functional capacity is limited d/t his COPD.  Recommendations: The patient is at high risk for perioperative cardiac complications and is at a low functional capacity.  However, further testing will not change how his cardiac status is managed.  Proceed with surgery at high risk if there are no other options for treatment. Antiplatelet and/or Anticoagulation Recommendations: Patient has not had an Afib/aflutter ablation in the last 3 months, DCCV within the last 4 weeks or a watchman implanted in the last 45 days  Per office protocol, patient can hold Eliquis  for 2 days prior to procedure.  Please resume as soon as safe to do so from a bleeding standpoint.  Will also fax her office note from 05/13/24 for review.   I will route this recommendation to the requesting party via Epic fax function and remove from pre-op pool.  Please call with questions.  Yuchen Fedor D Ruford Dudzinski, NP 05/18/2024, 10:22 AM

## 2024-05-18 NOTE — Telephone Encounter (Signed)
 Pt called in asking if he has been cleared or not. Please advise. He states his surgery is still scheduled for 05/24/24

## 2024-05-18 NOTE — Telephone Encounter (Signed)
 I s/w the pt and he tells me he is inquiring about the [employer].   He said the doctor who was going to do the Thoracentesis said too risky for the amount of fluid they were going to get, so that is not being done at this time per the pt.

## 2024-05-20 DIAGNOSIS — I4891 Unspecified atrial fibrillation: Secondary | ICD-10-CM | POA: Diagnosis not present

## 2024-05-23 NOTE — Telephone Encounter (Signed)
Left a detailed message on pt's answering machine.

## 2024-05-24 DIAGNOSIS — Z79890 Hormone replacement therapy (postmenopausal): Secondary | ICD-10-CM | POA: Diagnosis not present

## 2024-05-24 DIAGNOSIS — J4489 Other specified chronic obstructive pulmonary disease: Secondary | ICD-10-CM | POA: Diagnosis not present

## 2024-05-24 DIAGNOSIS — K219 Gastro-esophageal reflux disease without esophagitis: Secondary | ICD-10-CM | POA: Diagnosis not present

## 2024-05-24 DIAGNOSIS — Z87891 Personal history of nicotine dependence: Secondary | ICD-10-CM | POA: Diagnosis not present

## 2024-05-24 DIAGNOSIS — C342 Malignant neoplasm of middle lobe, bronchus or lung: Secondary | ICD-10-CM | POA: Diagnosis not present

## 2024-05-24 DIAGNOSIS — I4891 Unspecified atrial fibrillation: Secondary | ICD-10-CM | POA: Diagnosis not present

## 2024-05-24 DIAGNOSIS — Z7901 Long term (current) use of anticoagulants: Secondary | ICD-10-CM | POA: Diagnosis not present

## 2024-05-24 DIAGNOSIS — I10 Essential (primary) hypertension: Secondary | ICD-10-CM | POA: Diagnosis not present

## 2024-05-24 DIAGNOSIS — C3401 Malignant neoplasm of right main bronchus: Secondary | ICD-10-CM | POA: Diagnosis not present

## 2024-05-24 DIAGNOSIS — J432 Centrilobular emphysema: Secondary | ICD-10-CM | POA: Diagnosis not present

## 2024-05-24 DIAGNOSIS — Z888 Allergy status to other drugs, medicaments and biological substances status: Secondary | ICD-10-CM | POA: Diagnosis not present

## 2024-05-24 DIAGNOSIS — I2510 Atherosclerotic heart disease of native coronary artery without angina pectoris: Secondary | ICD-10-CM | POA: Diagnosis not present

## 2024-05-24 DIAGNOSIS — E785 Hyperlipidemia, unspecified: Secondary | ICD-10-CM | POA: Diagnosis not present

## 2024-05-24 DIAGNOSIS — Z79899 Other long term (current) drug therapy: Secondary | ICD-10-CM | POA: Diagnosis not present

## 2024-05-24 DIAGNOSIS — E039 Hypothyroidism, unspecified: Secondary | ICD-10-CM | POA: Diagnosis not present

## 2024-05-25 ENCOUNTER — Ambulatory Visit

## 2024-05-25 DIAGNOSIS — C342 Malignant neoplasm of middle lobe, bronchus or lung: Secondary | ICD-10-CM | POA: Diagnosis not present

## 2024-05-26 DIAGNOSIS — C342 Malignant neoplasm of middle lobe, bronchus or lung: Secondary | ICD-10-CM | POA: Diagnosis not present

## 2024-05-27 DIAGNOSIS — C342 Malignant neoplasm of middle lobe, bronchus or lung: Secondary | ICD-10-CM | POA: Diagnosis not present

## 2024-05-29 DIAGNOSIS — I4891 Unspecified atrial fibrillation: Secondary | ICD-10-CM | POA: Diagnosis not present

## 2024-05-30 ENCOUNTER — Ambulatory Visit: Attending: Cardiology | Admitting: *Deleted

## 2024-05-30 ENCOUNTER — Encounter: Payer: Self-pay | Admitting: *Deleted

## 2024-05-30 ENCOUNTER — Encounter: Payer: Self-pay | Admitting: Cardiology

## 2024-05-30 ENCOUNTER — Telehealth: Payer: Self-pay | Admitting: Pharmacy Technician

## 2024-05-30 ENCOUNTER — Telehealth: Payer: Self-pay | Admitting: *Deleted

## 2024-05-30 ENCOUNTER — Other Ambulatory Visit (HOSPITAL_COMMUNITY): Payer: Self-pay

## 2024-05-30 DIAGNOSIS — C342 Malignant neoplasm of middle lobe, bronchus or lung: Secondary | ICD-10-CM | POA: Diagnosis not present

## 2024-05-30 DIAGNOSIS — C3481 Malignant neoplasm of overlapping sites of right bronchus and lung: Secondary | ICD-10-CM | POA: Diagnosis not present

## 2024-05-30 DIAGNOSIS — I4891 Unspecified atrial fibrillation: Secondary | ICD-10-CM

## 2024-05-30 MED ORDER — BISOPROLOL FUMARATE 2.5 MG PO TABS: 2.5000 mg | ORAL_TABLET | Freq: Every day | ORAL | 3 refills | Status: DC

## 2024-05-30 NOTE — Patient Instructions (Signed)
Your physician recommends that you continue on your current medications as directed. Please refer to the Current Medication list given to you today. Your physician recommends that you schedule a follow-up appointment in: as planned 

## 2024-05-30 NOTE — Telephone Encounter (Signed)
 Patient informed and verbalized understanding of plan.

## 2024-05-30 NOTE — Progress Notes (Signed)
 Presents for EKG with wife per echo result and Peck's recommendations. Home BP readings brought to office and scanned to provider for review. Medications reviewed. Reports holding valsartan  80 mg for the past 2 weeks due to low blood pressures. Reports taking all doses of other medications as prescribed without side effects. Denies dizziness, chest pain or worsening SOB. Currently on 2L O2 via [place].  EKG done and routed to provider for review.   ZIO monitor results requested by patient reviewed during visit today.  Rhythm was predominantly normal sinus rhythm Atrial fibrillation noted with the longest run being 2 hours and 15 minutes Runs of non sustained ventricular tachycardia lasting 17.3 seconds vs SVT with aberrancy. Verbalized understanding.

## 2024-05-30 NOTE — Telephone Encounter (Signed)
 Insurance wont pay for 2.5mg  but they will pay for 5mg . Pharmacy filled 5mg 

## 2024-05-30 NOTE — Telephone Encounter (Signed)
-----   Message from [name] Crate sent at 05/30/2024 10:39 AM EST ----- Thank you for seeing him. Case discussed with Dr. Debera (DOD) in office today. BP log also reviewed and overall showed stable BP readings. Please remove losartan off his med list as he is not taking this and begin bisoprolol  2.5 mg daily and bring him back in for EKG visit and BP check and bring his log in for review also in 1 week.   Thanks!  Best, [name] Crate, NP ----- Message ----- From: Lenon Jinnie HERO, RN Sent: 05/30/2024  10:11 AM EST To: [name] Crate, NP

## 2024-06-03 ENCOUNTER — Telehealth: Payer: Self-pay | Admitting: Pharmacist

## 2024-06-03 ENCOUNTER — Telehealth: Payer: Self-pay | Admitting: Cardiology

## 2024-06-03 DIAGNOSIS — I4891 Unspecified atrial fibrillation: Secondary | ICD-10-CM

## 2024-06-03 NOTE — Telephone Encounter (Signed)
 Pt c/o medication issue:  1. Name of Medication:   apixaban  (ELIQUIS ) 5 MG TABS tablet    2. How are you currently taking this medication (dosage and times per day)?    3. Are you having a reaction (difficulty breathing--STAT)? no  4. What is your medication issue? Pharmasist called to say that his eliquis  needs to be reduct to 2.5mg . Please advise

## 2024-06-03 NOTE — Telephone Encounter (Signed)
 Concern has been addressed in another encounter. See encounter 06/03/24. Closing this encounter

## 2024-06-03 NOTE — Telephone Encounter (Signed)
 Received call from cancer center at [facility]. Patient is continuing to lose weight (58kg today) and is will be starting new chemo therapy meds. Last Scr 1.48. Cancer center plans to reduce Eliquis  dose to 2.5mg  BID. Will adjust med list.

## 2024-06-07 ENCOUNTER — Ambulatory Visit: Attending: Internal Medicine

## 2024-06-07 VITALS — BP 118/65 | HR 90 | Ht 70.0 in | Wt 130.2 lb

## 2024-06-07 DIAGNOSIS — I4891 Unspecified atrial fibrillation: Secondary | ICD-10-CM

## 2024-06-07 NOTE — Progress Notes (Signed)
 Patient here for vitals and EKG reports no symptoms currently. Reviewed medications and reported taking. Advised him will send over to [facility] to review. Patient also brought in his BP log, has been scanned into chart

## 2024-06-08 NOTE — Telephone Encounter (Signed)
-----   Message from [name] Crate, NP sent at 06/07/2024  4:35 PM EST ----- Here for nursing visit. EKG reviewed. HR is better. Does have artifact along multiple leads with PVC's noted. Vitals look good. Continue current tx plan. I will CC Dr. Lavona so he is aware.   Thanks!  Best, [name] Crate, NP

## 2024-06-08 NOTE — Telephone Encounter (Signed)
 Spoke with patient advised him of Elizabeth's recommendations patient verbalized understanding

## 2024-06-09 ENCOUNTER — Ambulatory Visit: Admitting: Nurse Practitioner

## 2024-06-17 ENCOUNTER — Ambulatory Visit: Attending: Nurse Practitioner | Admitting: Nurse Practitioner

## 2024-06-17 ENCOUNTER — Encounter: Payer: Self-pay | Admitting: Nurse Practitioner

## 2024-06-17 VITALS — BP 114/60 | HR 74 | Ht 70.0 in | Wt 125.0 lb

## 2024-06-17 DIAGNOSIS — I6529 Occlusion and stenosis of unspecified carotid artery: Secondary | ICD-10-CM | POA: Diagnosis not present

## 2024-06-17 DIAGNOSIS — I10 Essential (primary) hypertension: Secondary | ICD-10-CM | POA: Diagnosis not present

## 2024-06-17 DIAGNOSIS — I5020 Unspecified systolic (congestive) heart failure: Secondary | ICD-10-CM | POA: Diagnosis not present

## 2024-06-17 DIAGNOSIS — R0602 Shortness of breath: Secondary | ICD-10-CM

## 2024-06-17 DIAGNOSIS — I428 Other cardiomyopathies: Secondary | ICD-10-CM | POA: Diagnosis not present

## 2024-06-17 DIAGNOSIS — E785 Hyperlipidemia, unspecified: Secondary | ICD-10-CM | POA: Diagnosis not present

## 2024-06-17 DIAGNOSIS — I2510 Atherosclerotic heart disease of native coronary artery without angina pectoris: Secondary | ICD-10-CM

## 2024-06-17 DIAGNOSIS — J449 Chronic obstructive pulmonary disease, unspecified: Secondary | ICD-10-CM | POA: Diagnosis not present

## 2024-06-17 DIAGNOSIS — I4891 Unspecified atrial fibrillation: Secondary | ICD-10-CM

## 2024-06-17 NOTE — Progress Notes (Signed)
 " Cardiology Office Note   Date: 06/17/2024 ID:  [name] [name], DOB 07-07-1950, MRN [id number] PCP: Dettinger, [name], MD  [facility] Providers Cardiologist:  Lynwood Schilling, MD     History of Present Illness [name] [name] is a 73 y.o. male with a PMH of CAD, mixed HLD, HTN, hx of carotid artery stenosis, s/p right CEA, and tobacco use, and COPD, who presents today for follow-up.   Last seen by Dr. Schilling on Oct 29, 2022. Was overall doing well at the time. Was referred to [facility] for a PCSK9i and started on Norvasc  5 mg daily.   Recent hospitalization July 2025 due to atypical CP. Was scheduled for OP Lexiscan . Echo normal. See most recent results of OP Lexiscan  below.   01/29/2024 - He is here for follow-up. He states he is not doing well at all for the past 2 weeks. Mentions worsening shortness of breath and dyspea on exertion at rest. Has been so short of breath recently, says he's been having panic attacks because of it and says he strongly believes he needs to go to the ED to get help. Said he had a bad panic attack last night. Admits to DOE with minimal exertion and even DOE at rest. Denies any chest pain, palpitations, syncope, presyncope, dizziness, orthopnea, PND, swelling or significant weight changes, acute bleeding, or claudication.  She has saw pulmonology yesterday.  Lisinopril  was stopped and switched to valsartan  under 160 mg daily.  Working on getting him qualified for oxygen  and inhaler was switched.  He says he checked his SpO2 this morning on his pulse ox on room air, was 82%.  He was short of breath at that time when he checked it.  Does tell me that pulmonology gave an order for oxygen , but says it has not come in yet.  Has been also having issues with his abdomen, admits to feeling of being bloated/also noticing weight loss/constipation.  He was in the hospital in August 2025 due to acute respiratory failure with hypoxia and community-acquired  pneumonia of right lung.  Treated with antibiotics, mucolytic's and bronchodilators.  Discharged on antibiotics.  02/26/2024 -today he is here for follow-up with his wife.  He tells me he now has his oxygen , currently not wearing it today for office visit.  SpO2 today during vital signs is 99% on room air.  Tells me he is feeling better since leaving the hospital.  Does experience chest tightness when he has shortness of breath. Denies any chest pain, shortness of breath, palpitations, syncope, presyncope, dizziness, orthopnea, PND, swelling or significant weight changes, acute bleeding, or claudication.  04/28/2024 -  Here for follow-up with his wife. Continues to wear chronic O2. Does have a current cough, says sometimes coughs up trace of blood. Denies any other bleeding issues. Wife says MD believes his A-fib was caused by his recent procedure. Denies any chest pain, worsening shortness of breath, palpitations, syncope, presyncope, dizziness, orthopnea, PND, swelling or significant weight changes, acute bleeding, or claudication.  05/13/2024 - Here for follow-up. Continues to wear chronic O2. No change to his symptoms since last office visit. He denies any bleeding issues since being on Eliquis . Tolerating medication well. Breathing is stable per his report. Denies any chest pain, palpitations, syncope, presyncope, dizziness, orthopnea, PND, swelling or significant weight changes, acute bleeding, or claudication. Just finished wearing his monitor and has recently mailed this back in. Scheduled for a limited Echo to be done today. He is  pending insertion of central line on 05/24/2024.   06/17/2024 - Here for follow-up. He cannot tell a difference in symptoms from last office visit. Admits to current stomach issues and poor appetite, admits to unintentional weight loss. Breathing is stable per his report. He is now undergoing chemo and radiation. Denies any chest pain, palpitations, syncope, presyncope,  dizziness, orthopnea, PND, swelling or significant weight changes, acute bleeding, or claudication.   ROS: Negative.  See HPI.  Studies Reviewed  EKG: EKG is not ordered today.   Cardiac monitor 05/2024:  Rhythm was predominantly normal sinus rhythm Atrial fibrillation noted with the longest run being 2 hours and 15 minutes Runs of non sustained ventricular tachycardia lasting 17.3 seconds vs SVT with aberrancy.   Limited Echo 04/2024:  1. Incessant PVCs and poor image quality make assessment of LV function  and regional wall motion very challenging. There is profound  septal-lateral dyssynchrony during apical 4 chamber imaging, but he also  appears to have a (rate related?) LBBB during  that portion of the study. There is no left ventricular thrombus (Definity   contrast was used). Left ventricular ejection fraction, by estimation, is  40 to 45%. The left ventricle has mildly decreased function. The left  ventricle demonstrates regional  wall motion abnormalities (see scoring diagram/findings for description).  Indeterminate diastolic filling due to E-A fusion. There is severe  hypokinesis of the left ventricular, basal-mid inferior wall and  inferoseptal wall.   2. Right ventricular systolic function is normal. The right ventricular  size is normal.   3. A small pericardial effusion is present. The pericardial effusion is  anterior to the right ventricle. There is no evidence of cardiac  tamponade.   4. The mitral valve is grossly normal. Mild to moderate mitral valve  regurgitation. No evidence of mitral stenosis.   5. The aortic valve is grossly normal. No aortic stenosis is present.   6. The inferior vena cava is normal in size with greater than 50%  respiratory variability, suggesting right atrial pressure of 3 mmHg.   Comparison(s): Incessant arrhythmia and changes in QRS morphology during  the current study make it very challenging to compare to the previous  study.  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 The study is intermediate risk. Risk based on decreased LVEF, there is no current myocardium at jeopardy. Consider correlating LVEF with echo.   No ST deviation was noted.   LV perfusion is abnormal. Large severe intensity inferior/inferoapical defect that is fixed. Defect complicated by adjacent gut radiotracer uptake, however wall motion abnormality would support prior infarct. No current ischemia.   Left ventricular function is abnormal. Nuclear stress EF: 45%. The left ventricular ejection fraction is mildly decreased (45-54%). End diastolic cavity size is normal.  Echocardiogram 12/2023:  1. Challenging study due to COPD. Poor Echo images. Limited interrogation  of valves.   2. Left ventricular ejection fraction, by estimation, is 55 to 60%. The  left ventricle has normal function. The left ventricle has no regional  wall motion abnormalities. Left ventricular diastolic parameters are  consistent with Grade I diastolic  dysfunction (impaired relaxation).   3. Right ventricular systolic function is normal. The right ventricular  size is normal. There is normal pulmonary artery systolic pressure.   4. The mitral valve was not well visualized. No evidence of mitral valve  regurgitation. No evidence of mitral stenosis.   5. The aortic valve was not well visualized.  Aortic valve regurgitation  is not visualized. No aortic stenosis is present.   6. The inferior vena cava is normal in size with greater than 50%  respiratory variability, suggesting right atrial pressure of 3 mmHg.  Carotid duplex 06/2022: Summary:  Right Carotid: Velocities in the right ICA are consistent with a 1-39%  stenosis.   Left Carotid: Velocities in the left ICA are consistent with a 40-59%  stenosis.   Vertebrals: Bilateral vertebral arteries demonstrate antegrade flow.  Subclavians:  Normal flow hemodynamics were seen in bilateral subclavian               arteries.   *See table(s) above for measurements and observations.  Suggest follow up study in 12 months.  AAA 06/2021:  Summary:  Abdominal Aorta: There is evidence of abnormal ectasia of the distal  Abdominal aorta. The largest aortic measurement is 2.5 cm. The largest  aortic diameter remains essentially unchanged compared to prior exam.  Previous diameter measurement was 2.5 cm  obtained on 02/11/16.  Stenosis: +--------------------+-------------+------------+  Location            Stenosis     Comments      +--------------------+-------------+------------+  Right External Iliac>50% stenosismild/low-end  +--------------------+-------------+------------+      IVC/Iliac: There is no evidence of thrombus involving the IVC.    *See table(s) above for measurements and observations.  Suggest follow up study in 24 months.  LHC 07/2017:  Prox RCA to Mid RCA lesion is 100% stenosed. Ost Cx to Prox Cx lesion is 60% stenosed. Ost 1st Mrg lesion is 40% stenosed. Mid LAD to Dist LAD lesion is 50% stenosed. Dist LM to Prox LAD lesion is 20% stenosed. The left ventricular systolic function is normal. LV end diastolic pressure is normal. The left ventricular ejection fraction is 55-65% by visual estimate. There is no mitral valve regurgitation.   1. Chronic total occlusion of the large, dominant mid RCA with filling of the distal RCA from right to right bridging collaterals and left to right collaterals.  2. The LAD is a large caliber vessel that courses to the apex. The mid to distal LAD has a moderate stenosis. This does not appear to be flow limiting. The first diagonal branch is moderate in caliber and has a moderate stenosis.  3. The Circumflex artery is a moderate caliber vessel that supplies two OM branches. There is a moderate stenosis in the proximal Circumflex extending back to the ostium. This is looked  at closely in multiple views and does not appear to be flow limiting. This involves a moderate caliber first OM branch which has moderate ostial stenosis.  4. Normal LV systolic function   Recommendations: I would continue with medical management of his CAD. Consider addition of long acting nitrates. Tobacco cessation is recommended.   Physical Exam VS:  BP 114/60   Pulse 74   Ht 5' 10 (1.778 m)   Wt 125 lb (56.7 kg)   SpO2 93%   BMI 17.94 kg/m        Wt Readings from Last 3 Encounters:  06/17/24 125 lb (56.7 kg)  06/07/24 130 lb 3.2 oz (59.1 kg)  05/13/24 134 lb 9.6 oz (61.1 kg)    GEN: Thin, 73 year old male in acute distress, wearing chronic O2 NECK: No JVD; No carotid bruits CARDIAC: S1/S2, RRR, no murmurs, rubs, gallops RESPIRATORY:  Diminished to auscultation with labored work of breathing, tachypnea  ABDOMEN: Soft, non-tender, non-distended EXTREMITIES:  No edema; No deformity   ASSESSMENT  AND PLAN  HFmrEF, NICM Stage C, NYHA class II-III symptoms. EF 40-45%, NICM most likely. GDMT limited d/t symptoms and BP trends. Euvolemic and well compensated on exam. Continue current GDMT and will update limited Echo within 3 months. Low sodium diet, fluid restriction <2L, and daily weights encouraged. Educated to contact our office for weight gain of 2 lbs overnight or 5 lbs in one week.  A-fib Had a past episode most likely triggered by his recent procedure. See most recent cardiac monitor report noted above. HR is well controlled today. Continue low dose bisoprolol . Continue low dose Eliquis  for stroke prevention. Discussed avoiding triggers to A-fib. Care and ED precautions discussed.   CAD Stable with no anginal symptoms. No indication for ischemic evaluation. 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  See most recent Echo report noted above. Care and ED precautions discussed.   HLD, carotid artery stenosis LDL 61 from April  2025.  Continue current medication regimen.  Carotid duplex from last year showed no significant stenosis along right carotid with moderate left carotid artery stenosis, recommended to repeat testing in 1 year.  Will arrange carotid duplex next month. Heart healthy diet and regular cardiovascular exercise encouraged.   HTN BP stable. Discussed to monitor BP at home at least 2 hours after medications and sitting for 5-10 minutes. Heart healthy diet and regular cardiovascular exercise encouraged. No med changes at this time.   4. COPD, shortness of breath Breathing is stable, strongly feel that is occasional SHOB is related to his COPD and recent lung cancer diagnosis. I congratulated him on quitting smoking.  Continue follow-up with pulmonology and oncology who is managing this.  Care and ED precautions discussed.   Dispo: Follow-up with MD/APP in 3 months or sooner if any changes.  Signed, [name] Crate, NP   "

## 2024-06-17 NOTE — Patient Instructions (Signed)
 Medication Instructions:  Your physician recommends that you continue on your current medications as directed. Please refer to the Current Medication list given to you today.  *If you need a refill on your cardiac medications before your next appointment, please call your pharmacy*  Lab Work: None If you have labs (blood work) drawn today and your tests are completely normal, you will receive your results only by: MyChart Message (if you have MyChart) OR A paper copy in the mail If you have any lab test that is abnormal or we need to change your treatment, we will call you to review the results.  Testing/Procedures: Your physician has requested that you have a carotid duplex. This test is an ultrasound of the carotid arteries in your neck. It looks at blood flow through these arteries that supply the brain with blood. Allow one hour for this exam. There are no restrictions or special instructions.   Follow-Up: At [facility], you and your health needs are our priority.  As part of our continuing mission to provide you with exceptional heart care, our providers are all part of one team.  This team includes your primary Cardiologist (physician) and Advanced Practice Providers or APPs (Physician Assistants and Nurse Practitioners) who all work together to provide you with the care you need, when you need it.  Your next appointment:   3 month(s)  Provider:   [name] Crate, NP      We recommend signing up for the patient portal called MyChart.  Sign up information is provided on this After Visit Summary.  MyChart is used to connect with patients for Virtual Visits (Telemedicine).  Patients are able to view lab/test results, encounter notes, upcoming appointments, etc.  Non-urgent messages can be sent to your provider as well.   To learn more about what you can do with MyChart, go to [email or web address].   Other Instructions Thank you for choosing [facility]!

## 2024-06-24 ENCOUNTER — Ambulatory Visit: Payer: Self-pay

## 2024-06-24 ENCOUNTER — Ambulatory Visit: Admitting: Family Medicine

## 2024-06-24 NOTE — Telephone Encounter (Signed)
 FYI Only or Action Required?: FYI only for provider: appointment scheduled on virtual visit 06/24/24.  Patient was last seen in primary care on 03/14/2024 by Dettinger, [name], MD.  Called Nurse Triage reporting Anxiety.  Symptoms began several weeks ago.  Interventions attempted: Prescription medications: 25 MG Vistaril .  Symptoms are: gradually worsening.  Triage Disposition: See PCP When Office is Open (Within 3 Days)  Patient/caregiver understands and will follow disposition?: Yes                                  1. CONCERN: Did anything happen that prompted you to call today?      Anxiety attacks worsening in frequency, reports anxiety attacks happen 2-3 times per day  2. ANXIETY SYMPTOMS: Can you describe how you (your loved one; patient) have been feeling? (e.g., tense, restless, panicky, anxious, keyed up, overwhelmed, sense of impending doom).      Nervousness  3. ONSET: How long have you been feeling this way? (e.g., hours, days, weeks)     Last couple of weeks 5. FUNCTIONAL IMPAIRMENT: How have these feelings affected your ability to do daily activities? Have you had more difficulty than usual doing your normal daily activities? (e.g., getting better, same, worse; self-care, school, work, interactions)     Lack of appetite impacts ability to eat, reports wife has to help with bathing 7. RISK OF HARM - SUICIDAL IDEATION: Do you ever have thoughts of hurting or killing yourself? If Yes, ask:  Do you have these feelings now? Do you have a plan on how you would do this?     Denies SI, patient is safe at this time 8. TREATMENT:  What has been done so far to treat this anxiety? (e.g., medicines, relaxation strategies). What has helped?     Vistaril  25 MG, trazodone  100 MG, believes the dosages need to be increased because medications are no longer helping symptoms 10. POTENTIAL TRIGGERS: Do you drink caffeinated beverages (e.g.,  coffee, colas, teas), and how much daily? Do you drink alcohol or use any drugs? Have you started any new medicines recently?     Lung cancer 11. PATIENT SUPPORT: Who is with you now? Who do you live with? Do you have family or friends who you can talk to?        Wife  12. OTHER SYMPTOMS: Do you have any other symptoms? (e.g., feeling depressed, trouble concentrating, trouble sleeping, trouble breathing, palpitations or fast heartbeat, chest pain, sweating, nausea, or diarrhea)       Denies physical symptoms at this time, reports gasping and sweating when anxiety attacks occur    This RN advised evaluation by PCP. Patient is scheduled for virtual appointment with DOD/PCP this evening.   Copied from CRM [id number]. Topic: Clinical - Red Word Triage >> Jun 24, 2024 11:37 AM Graeme ORN wrote: Red Word that prompted transfer to Nurse Triage: increased anxiety attacks - request virtual visit with Dr dettinger today to increase medication - seen at cancer center they told him to call and get increase  Reason for Disposition  Panic attacks are increasing in frequency  Protocols used: Anxiety and Panic Attack-A-AH

## 2024-06-24 NOTE — Telephone Encounter (Signed)
 Copied from CRM [id number]. Topic: Appointments - Appointment Cancel/Reschedule >> Jun 24, 2024  1:06 PM Willma R wrote: Patient/patient representative is calling to cancel or reschedule an appointment. Warm transferred to NT since was for red word Answer Assessment - Initial Assessment Questions 1. REASON FOR CALL: What is the main reason for your call? or How can I best help you?     Wanting to cancel his virtual appt with Dr Dettinger this afternoon.  Cancer center provider reached out to PCP and they ended up prescribing medication. Reviewed precautions to take with Xanax  and advised on ED/UC for adverse reaction. Patient advised to follow up at a later time  Protocols used: Information Only Call - No Triage-A-AH

## 2024-06-24 NOTE — Telephone Encounter (Signed)
 FYI noted

## 2024-06-28 NOTE — Nursing Note (Signed)
" °   06/28/24 1247  Final Assessment  Patient's Post Acute Contact Information See demo  Has a PCP appointment been made? No  Has a specialist appointment been made? Yes  Post Acute Facility needed at discharge? No  Home Care/ Home Medical Equipment needed at discharge? Yes  Home Care/ Home Medical Equipment Home Health (specify);HME (Home Medical Supplies) (specify) (HH - RN/PT, [employer] - DME (walker))  Home Health Provider (Name/Phone #) [facility]  HME Agency (Name/Phone #) [facility]  Outpatient/Community Referrals needed for discharge? No  Currently receiving outpatient dialysis? N/A  Discharge Disposition Home w/ Home Health  Transportation Anticipated family or friend will provide  Quality data for continuing care services shared with patient and/or representative? Yes  Patient and/or family were provided with choice of facilities / services that are available and appropriate to meet post hospital care needs? Yes  Final Assessment Complete  Final Assessment Complete Yes    "

## 2024-06-28 NOTE — Discharge Summary (Signed)
 " DISCHARGE SUMMARY [facility]   Discharge date:   June 28, 2024 Length of stay:    LOS: 3 days    Discharge Service:   [facility] Hospitalists Discharge Attending Physician: No att. providers found Discharge to:    To Home with Home Health Condition at Discharge:  good Code status:                         DNR and DNI   Hospital Course: Discharge diagnoses -Paroxysmal atrial fibrillation with rapid ventricular response -Community-acquired pneumonia -Stage IIIb non-small cell lung cancer -Exacerbation of COPD -Chronic respiratory failure with hypoxia -Moderate protein calorie malnutrition -Possible acute on chronic systolic heart failure   74 year old man presenting to the emergency room with progressive shortness of breath and cough for 1 week.  In the emergency room he was found to have new right lower lobe pneumonia as well as atrial fibrillation with rapid ventricular response.  He was treated with doxycycline  and ceftriaxone .  Loaded with amiodarone and he was seen by cardiology.  Patient had recent echocardiogram on 11/21 which showed ejection fraction of 40 to 45%.  He responded well to treatment and a went into sinus rhythm.  He was on his baseline oxygen .  He will be discharged home to complete a course of doxycycline /cefdinir .  He will also complete loading with amiodarone.  Follow-up with his cardiologist.    ______________________________________   Admission HPI    Patient admitted on: 06/25/2024  1:53 PM  Patient admitted by: Margart Elsie Dragon, DO    CHIEF COMPLAINT: Shortness of breath   Day of admission HPI:  [name] [name]  is a 74 y.o. male with a PMH significant for stage IIIb non-small cell lung cancer being treated with weekly chemo (Mondays) and daily radiation, atrial fibrillation on anticoagulation, primary hypertension, COPD, chronic idiopathic constipation, and anxiety disorder who presented with several month history of shortness of  breath with worsening over the past 1 week.  Patient reports increased cough, sputum production, and cold chills.  In the ED a chest x-ray reveals possible right lower lobe pneumonia, question postobstructive given patient with hilar mass secondary to lung cancer.  On arrival in the ED patient significantly tachycardic up into the 170s, he was placed on amiodarone infusion by ED providers and has responded favorably.  We will plan to continue this while hospitalized and possibly transition to p.o. loading amiodarone in the next 24 to 48 hours.  Patient's wife Ronal called per patient request and provided updates.  Patient will be admitted for further care.  He does wear home oxygen  2 L.   Patient admitted on Home O2? - yes Patient on home anticoagulant? -  yes Patient admitted with Chronic home foley catheter? - no Foley catheter placed or replaced by another service prior to admission? - no Central Line Status: NONE   Mental Status on Admission: The patient is Alert and oriented to PERSON The patient is Alert And oriented to TIME The patient is Alert and oriented to LOCATION   ______________________________________  Mental Status On day of Discharge:  The patient is Alert and oriented to PERSON The patient is Alert And oriented to TIME The patient is Alert and oriented to LOCATION  CODE STATUS :                    DNR and DNI   An advanced care planning discussion was  had with patient  and/or patient's decisions maker (documented separately).  Patient discharged on Home O2? - yes Patient discharged on home anticoagulant? -  yes  Foley Catheter status: None Central Line Status: NONE(Patient has portacath)  Time Spent on Discharge I spent greater than 30 minutes counseling and coordinating care for the discharge of this patient.  Discharge Medications     Your Medication List     STOP taking these medications    methylPREDNISolone  4 mg tablet Commonly known as: MEDROL   DOSEPACK       START taking these medications    amiodarone 200 MG tablet Commonly known as: PACERONE Take 2 tablets (400 mg total) by mouth 2 (two) times a day with meals for 5 days, THEN 1 tablet (200 mg total) 2 (two) times a day with meals for 7 days, THEN 1 tablet (200 mg total) daily. Start taking on: June 28, 2024   cefdinir  300 MG capsule Commonly known as: OMNICEF  Take 1 capsule (300 mg total) by mouth two (2) times a day. Start taking on: June 29, 2024   doxycycline  100 MG tablet Commonly known as: VIBRA -TABS Take 1 tablet (100 mg total) by mouth two (2) times a day for 5 days. Start taking on: June 29, 2024       CONTINUE taking these medications    albuterol  0.63 mg/3 mL nebulizer solution Commonly known as: ACCUNEB  Inhale 3 mL (0.63 mg total) by nebulization every six (6) hours as needed for wheezing.   albuterol  90 mcg/actuation inhaler Commonly known as: PROVENTIL  HFA;VENTOLIN  HFA Inhale 2 puffs every six (6) hours as needed.   ALPRAZolam  0.5 MG tablet Commonly known as: XANAX  Take 1 tablet (0.5 mg total) by mouth nightly as needed for sleep or anxiety (take one daily prior to radiation or at night as needed for anxiety).   apixaban  5 mg Tab Commonly known as: ELIQUIS  Take 1 tablet (5 mg total) by mouth two (2) times a day.   bisoprolol  fumarate 2.5 mg Tab Take 1 tablet (2.5 mg total) by mouth daily.   BREZTRI  AEROSPHERE 160-9-4.8 mcg/actuation inhaler Generic drug: budesonide -glycopyr-formoterol  Inhale 2 puffs two (2) times a day.   fluticasone  propionate 50 mcg/actuation nasal spray Commonly known as: FLONASE  1 spray into each nostril every morning.   hydrOXYzine  25 MG tablet Commonly known as: ATARAX  Take 1 tablet (25 mg total) by mouth daily as needed for anxiety.   levothyroxine  50 MCG tablet Commonly known as: SYNTHROID  Take 1 tablet (50 mcg total) by mouth every morning.   NEXLIZET  180-10 mg Tab Generic drug: bempedoic  acid-ezetimibe  Take 1 tablet by mouth daily.   nitroglycerin  0.4 MG SL tablet Commonly known as: NITROSTAT  Place 1 tablet (0.4 mg total) under the tongue every five (5) minutes as needed for chest pain.   omeprazole  20 MG capsule Commonly known as: PriLOSEC Take 1 capsule (20 mg total) by mouth every morning.   ondansetron  8 MG tablet Commonly known as: ZOFRAN  Take 1 tablet (8 mg total) by mouth every eight (8) hours as needed for nausea (or vomiting).   OXYGEN -AIR DELIVERY SYSTEMS MISC by Miscellaneous route. Home O2. 2L/min   prochlorperazine 10 MG tablet Commonly known as: COMPAZINE Take 1 tablet (10 mg total) by mouth every six (6) hours as needed (Nausea/Vomiting).   traZODone  100 MG tablet Commonly known as: DESYREL  Take 1 tablet (100 mg total) by mouth nightly.   valsartan  80 MG tablet Commonly known as: DIOVAN  Take 1 tablet (80 mg total) by mouth in  the morning.       _____________________________________  Nutrition:                                                 ___________________________________________  Discharge Instructions   Nutrition:                                   Activity:                                    Appointments:                         Appointments which have been scheduled for you    Jun 28, 2024 1:45 PM (Arrive by 1:30 PM) OFFICE VISIT with Alm Camellia Blumenthal, MD [facility] CANCER CARE [facility] RADIATION ONCOLOGY EDEN [facility]) [address] [place] [address] [phone]     Jun 28, 2024 2:00 PM (Arrive by 1:45 PM) TREATMENT with [facility] INF1 [facility] CANCER CARE [facility] RADIATION ONCOLOGY EDEN [facility]) [address] [place] [address] [phone]     Jun 29, 2024 2:00 PM (Arrive by 1:45 PM) TREATMENT with RADONC R[facility] INF1 [facility] CANCER CARE [facility] RADIATION ONCOLOGY EDEN [facility]) [address] [place] [address] [phone]     Jun 29, 2024 2:15  PM (Arrive by 2:00 PM) ADULT PERIPHERAL DRAW with [facility] ONC PERIPHERAL LAB DRAW [facility] CANCER CARE [facility] HEMATOLOGY ONCOLOGY EDEN [facility]) [address] [place] [address] [phone]     Jun 29, 2024 2:15 PM (Arrive by 2:00 PM) INJECTION with [facility] INFUSION CHAIR 01 [facility] CANCER CARE [facility] ONCOLOGY INFUSION EDEN [facility]) [address] [place] [address] [phone]     Jun 29, 2024 2:30 PM (Arrive by 2:15 PM) RETURN ACTIVE [place] with Mertie Bobette Letty Mickey., MD [facility] CANCER CARE [facility] HEMATOLOGY ONCOLOGY EDEN [facility]) [address] [place] [address] [phone]     Jun 30, 2024 2:00 PM (Arrive by 1:45 PM) TREATMENT with RADONC R[facility] INF1 [facility] CANCER CARE [facility] RADIATION ONCOLOGY EDEN [facility]) [address] [place] [address] [phone]     Jul 01, 2024 2:00 PM (Arrive by 1:45 PM) TREATMENT with RADONC R[facility] INF1 [facility] CANCER CARE [facility] RADIATION ONCOLOGY EDEN [facility]) [address] [place] [address] [phone]     Jul 04, 2024 2:00 PM (Arrive by 1:45 PM) TREATMENT with RADONC R[facility] INF1 [facility] CANCER CARE [facility] RADIATION ONCOLOGY EDEN [facility]) [address] [place] [address] [phone]     Jul 04, 2024 2:15 PM (Arrive by 2:00 PM) OFFICE VISIT with Alm Camellia Blumenthal, MD [facility] CANCER CARE [facility] RADIATION ONCOLOGY EDEN [facility]) [address] [place] [address] [phone]     Jul 05, 2024 2:00 PM (Arrive by 1:45 PM) TREATMENT with RADONC ROCK INF1 [facility] CANCER CARE [facility] RADIATION ONCOLOGY EDEN [facility]) [address] [place] [address] [phone]     Jul 06, 2024 2:00 PM (Arrive by 1:45  PM) TREATMENT with RADONC ROCK INF1 [facility] CANCER CARE [facility] RADIATION ONCOLOGY EDEN [facility]) [address] [place] [address] [phone]      Jul 07, 2024 2:00 PM (Arrive by 1:45 PM) TREATMENT with RADONC ROCK INF1 [facility] CANCER CARE [facility] RADIATION ONCOLOGY EDEN [facility]) [address] [place] [address] [phone]     Jul 08, 2024 2:00 PM (Arrive by 1:45 PM) TREATMENT with RADONC ROCK INF1 [facility] CANCER CARE [facility] RADIATION ONCOLOGY EDEN [facility]) [address] [place] [address] [phone]     Jul 12, 2024 2:00 PM (Arrive by 1:45 PM) TREATMENT with RADONC ROCK INF1 [facility]ER CARE [facility] RADIATION ONCOLOGY EDEN [facility]) [address] [place] [address] [phone]     Jul 12, 2024 2:15 PM (Arrive by 2:00 PM) OFFICE VISIT with Alm Camellia Blumenthal, MD [facility] CANCER CARE [facility] RADIATION ONCOLOGY EDEN [facility] [facility]) [address] [place] [address] [phone]     Jul 13, 2024 2:00 PM (Arrive by 1:45 PM) TREATMENT with RADONC R[facility] INF1 [facility] CANCER CARE [facility] RADIATION ONCOLOGY EDEN [facility]) [address] [place] [address] [phone]     Jul 14, 2024 2:00 PM (Arrive by 1:45 PM) TREATMENT with RADONC ROCK INF1 [facility] CANCER CARE [facility] RADIATION ONCOLOGY EDEN [facility]) [address] [place] [address] [phone]     Jul 15, 2024 2:00 PM (Arrive by 1:45 PM) TREATMENT with RADONC ROCK INF1 [facility] CANCER CARE [facility] RADIATION ONCOLOGY EDEN [facility]) [address] [place] [address] [phone]     Jul 18, 2024 2:00 PM (Arrive by 1:45 PM) TREATMENT with RADONC ROCK INF1 [facility] CANCER CARE [facility] RADIATION ONCOLOGY EDEN [facility]) [address] [place] [address] [phone]     Jul 19, 2024 2:00 PM (Arrive by 1:45 PM) TREATMENT with RADONC ROCK INF1 [facility] CANCER CARE [facility] RADIATION ONCOLOGY EDEN [facility]) [address] [place] [address] [phone]     Jul 19, 2024 2:15 PM (Arrive  by 2:00 PM) OFFICE VISIT with Alm Camellia Blumenthal, MD [facility] CANCER CARE [facility] RADIATION ONCOLOGY EDEN [facility]) [address] [place] [address] [phone]     Jul 20, 2024 2:00 PM (Arrive by 1:45 PM) TREATMENT with RADONC ROCK INF1 [facility] CANCER CARE [facility] RADIATION ONCOLOGY EDEN [facility]) [address] [place] [address] [phone]         Follow Up:                              Follow Up instructions and Outpatient Referrals    Ambulatory Referral to Home Health     Requested [facility] Date: 06/29/2024   Disciplines requested:  Nursing Physical Therapy     Nursing requested: Teaching/skilled observation and assessment   What teaching is needed (new diagnosis? new medications?): Cancer   Physical Therapy requested: Evaluate and treat   Physician to follow patient's care: PCP   Requested start of care date: Routine (within 48 hours) Comment - Camie Louder, FNP at [facility]   Reason for referral: [facility]   Requested follow up plan: You would evaluate and manage.       Allergies  Allergen Reactions   Atorvastatin Calcium  Other (See Comments)  Body cramps, lethargy and muscle aches   Rosuvastatin Calcium  Other (See Comments)    Lethargy and muscle aches   Tamsulosin  Hcl Other (See Comments)    Made patient hypotensive, clammy, sweaty, and feel like he was going to pass out (EMS HAD TO BE CALLED)     Past Medical History[1]  Past Surgical History[2]   Family History[3]   Current Medications[4]  Imaging  XR Chest 2 views Result Date: 06/25/2024 Exam:  Chest Two Views  History:  Shortness of breath.  Technique:  2 views  Comparison:  CT 04/15/2024  Findings:  Persistent right middle lobe consolidation and hilar fullness. New right lower lobe opacity and likely subpulmonic pleural effusion. Background emphysema. Partially obscured cardiomediastinal silhouette. Left chest port catheter tip projecting near the superior  cavoatrial junction.    1. Persistent right middle lobe consolidation and hilar fullness. New right lower lobe opacity may represent a combination of worsening subpulmonic pleural effusion and airspace disease.  Signed (Electronic Signature): 06/25/2024 3:10 PM Signed By: Roselee Dienes, MD   Lab Results   Recent Labs    06/28/24 0435  WBC 2.5*  HGB 8.8*  HCT 25.3*  PLT 93*   Recent Labs    06/25/24 1444 06/25/24 1650 06/26/24 0845 06/28/24 0435  NA 137  --    < > 134*  K 4.7  --    < > 3.4*  CL 103  --    < > 101  CO2 24.8  --    < > 26.9  BUN 17  --    < > 15  CREATININE 1.17  --    < > 1.01  GLU 136  --    < > 118  CALCIUM  7.7*  --    < > 7.9*  ALBUMIN 2.5*  --   --   --   PROT 6.8  --   --   --   BILITOT 0.9  --   --   --   AST 29  --   --   --   ALT 32  --   --   --   ALKPHOS 86  --   --   --   MG 1.4*  --    < > 1.7  PHOS  --   --    < > 1.9*  LACTATE  --  1.3  --   --    < > = values in this interval not displayed.   Recent Labs    06/25/24 1444 06/27/24 0015  TROPONINI 15  --   INR  --  2.00   No results for input(s): WBCUA, NITRITE, LEUKOCYTESUR, BACTERIA, RBCUA, BLOODU, GLUCOSEU, PROTEINUA, KETONESU, KETUR in the last 72 hours. No results for input(s): OPIAU, BENZU, TRICYCLIC, PCPU, AMPHU, COCAU, CANNAU, BARBU, ETOH, ACETAMIN, SALICYLATE in the last 72 hours. No results for input(s): PREGTESTUR, PREGPOC in the last 72 hours. No results for input(s): OCCULTBLD, RAPSCRN, CDIFRPCR, CDIFFNAP1, A1C, CHOL, LDL, HDL, TRIG in the last 72 hours. Recent Labs    06/25/24 1405  PHART 7.46*  PCO2ART 30.5*  PO2ART 68*  HCO3ART 21.4  O2SATART 95.0  BEART -1.8   Pending Labs     Order Current Status   C. difficile Assay Collected (06/27/24 1346)   Blood Culture STAT Preliminary result       Home Medications   Prior to Admission medications  Medication Dose, Route, Frequency  albuterol   (ACCUNEB ) 0.63 mg/3 mL nebulizer solution 1 ampule,  Every 6 hours PRN  albuterol  HFA 90 mcg/actuation inhaler 2 puffs, Inhalation, Every 6 hours PRN Patient taking differently: Inhale 2 puffs every six (6) hours as needed for wheezing or shortness of breath.  ALPRAZolam  (XANAX ) 0.5 MG tablet 0.5 mg, Oral, Nightly PRN  amiodarone (PACERONE) 200 MG tablet Take 2 tablets (400 mg total) by mouth 2 (two) times a day with meals for 5 days, THEN 1 tablet (200 mg total) 2 (two) times a day with meals for 7 days, THEN 1 tablet (200 mg total) daily.  apixaban  (ELIQUIS ) 5 mg Tab 5 mg, 2 times a day (standard)  bisoprolol  fumarate 2.5 mg Tab 2.5 mg, Daily (standard)  budesonide -glycopyr-formoterol  (BREZTRI  AEROSPHERE) 160-9-4.8 mcg/actuation inhaler 2 puffs, 2 times a day (standard)  cefdinir  (OMNICEF ) 300 MG capsule 300 mg, Oral, 2 times a day (standard)  doxycycline  (VIBRA -TABS) 100 MG tablet 100 mg, Oral, 2 times a day (standard)  fluticasone  propionate (FLONASE ) 50 mcg/actuation nasal spray 1 spray, Every morning  hydrOXYzine  (ATARAX ) 25 MG tablet 25 mg, Daily PRN  levothyroxine  (SYNTHROID ) 50 MCG tablet 50 mcg, Every morning  NEXLIZET  180-10 mg Tab 1 tablet, Daily (standard)  nitroglycerin  (NITROSTAT ) 0.4 MG SL tablet 0.4 mg, Every 5 min PRN  omeprazole  (PRILOSEC) 20 MG capsule Take 1 capsule (20 mg total) by mouth every morning.  ondansetron  (ZOFRAN ) 8 MG tablet 8 mg, Oral, Every 8 hours PRN  OXYGEN -AIR DELIVERY SYSTEMS MISC by Miscellaneous route. Home O2. 2L/min  prochlorperazine (COMPAZINE) 10 MG tablet 10 mg, Oral, Every 6 hours PRN  traZODone  (DESYREL ) 100 MG tablet 100 mg, Nightly  valsartan  (DIOVAN ) 80 MG tablet 80 mg, Daily (standard)   Elspeth GORMAN Kay, MD Hospitalist, [facility] 06/28/2024, 12:18 PM      [1] Past Medical History: Diagnosis Date   Anxiety    Aortic atherosclerosis    tortuous aorta   Arthritis    Atrial fibrillation    (CMS-HCC)    recently episode after  bronchoscopy procedure   Bilateral carotid artery stenosis    CAD (coronary artery disease)    coronary artery calcifications   Chronic bronchitis    (CMS-HCC)    Colon polyps    COPD (chronic obstructive pulmonary disease) (CMS-HCC) 5 years   emphysema   Difficult intravenous access    Dyspnea on exertion    did not have SOB until approximately 5 months ago   Former smoker    quit 01/2024   Full dentures    GERD (gastroesophageal reflux disease)    Hyperlipidemia    Hypertension    Hypothyroidism    Kidney stones    Lung cancer    (CMS-HCC)    MI (myocardial infarction) (CMS-HCC) 01/2024   left arm numbness. cardiologist told patient he had evidence of mild heart attack   On home oxygen  therapy    2L/min [place]   Panic attacks    recently due to cancer diagnosis   Pneumonia 01/31/2024   hospital admission   Productive cough    large amounts of clear sputum throughout the day  [2] Past Surgical History: Procedure Laterality Date   BRONCHOSCOPY  04/25/2024   BRONCHOSCOPY  2005   lung nodule benign   CARDIAC CATHETERIZATION  08/04/2017   CARDIAC CATHETERIZATION  2009   CARDIOTHORACIC PROCEDURE  Ten  years   CAROTID ENDARTERECTOMY Right    CHG FLUOROGUIDE CNTRL VEN ACCESS,PLACE,REPLACE,REMOVE Left 05/24/2024   Procedure: FLUOROSCOPIC GUIDANCE FOR CENTRAL VENOUS ACCESS DEVICE PLACEMENT, REPLACEMENT, OR REMOVAL;  Surgeon: Straughan, Megan  Rollo, MD;  Location: OR [facility];  Service: General Surgery   CHG US  GUIDE, VASCULAR ACCESS Left 05/24/2024   Procedure: ULTRASOUND GUIDANCE FOR VASC ACCESS REQUIRING US  EVAL OF POTENTIAL ACCESS SITES;  Surgeon: Celia Duwaine Rollo, MD;  Location: OR [facility];  Service: General Surgery   CHOLECYSTECTOMY  05/30/2021   COLONOSCOPY     colon polyps.   HERNIA REPAIR Left    left inguinal   PR INSERT TUNNELED CV CATH WITH PORT Left 05/24/2024   Procedure: INSERTION OF TUNNELED CENTRALLY INSERTED CENTRAL VENOUS  ACCESS DEVICE WITH SUBCUTANEOUS PORT >= 5 YRS OLD;  Surgeon: Celia Duwaine Rollo, MD;  [facility];  Service: General Surgery   THROAT SURGERY  1997   tumor benign   WISDOM TOOTH EXTRACTION  1997  [3] Family History Problem Relation Age of Onset   Cancer Paternal Aunt    Cancer Paternal Aunt   [4] No current facility-administered medications for this encounter.  Current Outpatient Medications:    albuterol  (ACCUNEB ) 0.63 mg/3 mL nebulizer solution, Inhale 3 mL (0.63 mg total) by nebulization every six (6) hours as needed for wheezing., Disp: , Rfl:    albuterol  HFA 90 mcg/actuation inhaler, Inhale 2 puffs every six (6) hours as needed. (Patient taking differently: Inhale 2 puffs every six (6) hours as needed for wheezing or shortness of breath.), Disp: 8 g, Rfl: 0   ALPRAZolam  (XANAX ) 0.5 MG tablet, Take 1 tablet (0.5 mg total) by mouth nightly as needed for sleep or anxiety (take one daily prior to radiation or at night as needed for anxiety)., Disp: 30 tablet, Rfl: 0   amiodarone (PACERONE) 200 MG tablet, Take 2 tablets (400 mg total) by mouth 2 (two) times a day with meals for 5 days, THEN 1 tablet (200 mg total) 2 (two) times a day with meals for 7 days, THEN 1 tablet (200 mg total) daily., Disp: 64 tablet, Rfl: 0   apixaban  (ELIQUIS ) 5 mg Tab, Take 1 tablet (5 mg total) by mouth two (2) times a day., Disp: , Rfl:    bisoprolol  fumarate 2.5 mg Tab, Take 1 tablet (2.5 mg total) by mouth daily., Disp: , Rfl:    budesonide -glycopyr-formoterol  (BREZTRI  AEROSPHERE) 160-9-4.8 mcg/actuation inhaler, Inhale 2 puffs two (2) times a day., Disp: , Rfl:    [START ON 06/29/2024] cefdinir  (OMNICEF ) 300 MG capsule, Take 1 capsule (300 mg total) by mouth two (2) times a day., Disp: 10 capsule, Rfl: 0   [START ON 06/29/2024] doxycycline  (VIBRA -TABS) 100 MG tablet, Take 1 tablet (100 mg total) by mouth two (2) times a day for 5 days., Disp: 10 tablet, Rfl: 0   fluticasone  propionate  (FLONASE ) 50 mcg/actuation nasal spray, 1 spray into each nostril every morning., Disp: , Rfl:    hydrOXYzine  (ATARAX ) 25 MG tablet, Take 1 tablet (25 mg total) by mouth daily as needed for anxiety., Disp: , Rfl:    levothyroxine  (SYNTHROID ) 50 MCG tablet, Take 1 tablet (50 mcg total) by mouth every morning., Disp: , Rfl:    NEXLIZET  180-10 mg Tab, Take 1 tablet by mouth daily., Disp: , Rfl:    nitroglycerin  (NITROSTAT ) 0.4 MG SL tablet, Place 1 tablet (0.4 mg total) under the tongue every five (5) minutes as needed for chest pain., Disp: , Rfl:    omeprazole  (PRILOSEC) 20 MG capsule, Take 1 capsule (20 mg total) by mouth every morning., Disp: , Rfl:    ondansetron  (ZOFRAN ) 8 MG tablet, Take 1 tablet (8 mg total) by  mouth every eight (8) hours as needed for nausea (or vomiting)., Disp: 30 tablet, Rfl: 2   OXYGEN -AIR DELIVERY SYSTEMS MISC, by Miscellaneous route. Home O2. 2L/min, Disp: , Rfl:    prochlorperazine (COMPAZINE) 10 MG tablet, Take 1 tablet (10 mg total) by mouth every six (6) hours as needed (Nausea/Vomiting)., Disp: 30 tablet, Rfl: 2   traZODone  (DESYREL ) 100 MG tablet, Take 1 tablet (100 mg total) by mouth nightly., Disp: , Rfl:    valsartan  (DIOVAN ) 80 MG tablet, Take 1 tablet (80 mg total) by mouth in the morning., Disp: , Rfl:  "

## 2024-06-28 NOTE — Nursing Note (Signed)
 Walker ordered through [employer] and will be delivered to the patients home address on file.  Home health RN/PT arranged with [facility] home [facility].

## 2024-06-29 ENCOUNTER — Telehealth: Payer: Self-pay

## 2024-06-29 NOTE — Transitions of Care (Post Inpatient/ED Visit) (Signed)
 "  06/29/2024  Name: [name] [name] MRN: [id number] DOB: [birth year]  Today's TOC FU Call Status: Today's TOC FU Call Status:: Successful TOC FU Call Completed TOC FU Call Complete Date: 06/29/24  Patient's Name and Date of Birth confirmed. Name, DOB  Transition Care Management Follow-up Telephone Call Date of Discharge: 06/28/24 Discharge Facility: Other [facility]) Name of Other (Non[facility]) Discharge Facility: [facility] Type of Discharge: Inpatient Admission Primary Inpatient Discharge Diagnosis:: Community-acquired pneumonia /Paroxysmal atrial fibrillation with rapid ventricular response How have you been since you were released from the hospital?: Same Any questions or concerns?: No  Items Reviewed: Did you receive and understand the discharge instructions provided?: Yes Medications obtained,verified, and reconciled?: Yes (Medications Reviewed) Any new allergies since your discharge?: No Dietary orders reviewed?: Yes Type of Diet Ordered:: Heart Healthu Do you have support at home?: Yes People in Home [RPT]: grandchild(ren), spouse Name of Support/Comfort Primary Source: [name] [name]  Medications Reviewed Today: Medications Reviewed Today     Reviewed by Elisabel Hanover, RN (Case Manager) on 06/29/24 at 1215  Med List Status: <None>   Medication Order Taking? Sig Documenting Provider Last Dose Status Informant  albuterol  (PROVENTIL ) (2.5 MG/3ML) 0.083% nebulizer solution [id number] Yes Take 3 mLs (2.5 mg total) by nebulization every 4 (four) hours as needed for wheezing or shortness of breath. Pearlean Manus, MD  Active   amiodarone (PACERONE) 200 MG tablet [id number] Yes Take 400 mg by mouth 2 (two) times daily. Take 2 tablets (400 mg total) by mouth 2 (two) times a day with meals for 5 days, THEN 1 tablet (200 mg total) 2 (two) times a day with meals for 7 days, THEN 1 tablet (200 mg total) daily. [provider]  Active   apixaban  (ELIQUIS ) 2.5 MG TABS  tablet [id number] Yes Take 1 tablet (2.5 mg total) by mouth 2 (two) times daily. Lavona Agent, MD  Active   Bempedoic Acid -Ezetimibe  (NEXLIZET ) 180-10 MG TABS [id number] Yes TAKE ONE TABLET DAILY Dettinger, [name], MD  Active Self, Pharmacy Records  Bisoprolol  Fumarate 2.5 MG TABS [id number] Yes Take 2.5 mg by mouth daily. Miriam Norris, NP  Active   budesonide -glycopyrrolate -formoterol  (BREZTRI  AEROSPHERE) 160-9-4.8 MCG/ACT AERO inhaler [id number]  Inhale 2 puffs into the lungs in the morning and at bedtime.  Patient not taking: Reported on 06/29/2024   Pearlean Manus, MD  Active   fluticasone  (FLONASE ) 50 MCG/ACT nasal spray [id number] Yes Place 2 sprays into both nostrils daily. [place] Thompson, Nena, NP  Active Self, Pharmacy Records  hydrOXYzine  (VISTARIL ) 25 MG capsule [id number] Yes TAKE ONE CAPSULE EVERY 8 HOURS AS NEEDED Dettinger, [name], MD  Active   levothyroxine  (SYNTHROID ) 50 MCG tablet [id number] Yes Take 1 tablet (50 mcg total) by mouth daily before breakfast. Pearlean Manus, MD  Active   nitroGLYCERIN  (NITROSTAT ) 0.4 MG SL tablet [id number] Yes Place 1 tablet (0.4 mg total) under the tongue every 5 (five) minutes as needed for chest pain. Miriam Norris, NP  Active   omeprazole  (PRILOSEC) 20 MG capsule [id number] Yes Take 1 capsule (20 mg total) by mouth daily. Pearlean Manus, MD  Active   ondansetron  (ZOFRAN ) 8 MG tablet [id number] Yes Take 8 mg by mouth every 8 (eight) hours as needed. [provider]  Active   OXYGEN  [id number] Yes Inhale 2 L into the lungs daily. [provider]  Active   prochlorperazine (COMPAZINE) 10 MG tablet [id number] Yes Take 10 mg by mouth every 8 (eight) hours as needed. [provider]  Active   traZODone  (DESYREL ) 100 MG tablet [id number] Yes Take 1 tablet (100 mg total) by mouth at bedtime. Dettinger, [name], MD  Active   valsartan  (DIOVAN ) 80 MG tablet [id number] Yes Take 80 mg by mouth daily. [provider]   Active             Home Care and Equipment/Supplies: Were Home Health Services Ordered?: Yes Name of Home Health Agency:: [facility] Has Agency set up a time to come to your home?: No (Patient has contact information for home health) Any new equipment or medical supplies ordered?: NA  Functional Questionnaire: Do you need assistance with bathing/showering or dressing?: Yes Do you need assistance with meal preparation?: Yes Do you need assistance with eating?: No Do you have difficulty maintaining continence: No Do you need assistance with getting out of bed/getting out of a chair/moving?: No Do you have difficulty managing or taking your medications?: Yes  Follow up appointments reviewed: PCP Follow-up appointment confirmed?: No (Patient declined CM to help with hospital follow up.  Family to assist) MD Provider Line Number:[phone] Given: No Specialist Hospital Follow-up appointment confirmed?: Yes Date of Specialist follow-up appointment?: 06/30/24 Follow-Up Specialty Provider:: Oncology- Letty Do you need transportation to your follow-up appointment?: No Do you understand care options if your condition(s) worsen?: Yes-patient verbalized understanding  SDOH Interventions Today    Flowsheet Row Most Recent Value  SDOH Interventions   Food Insecurity Interventions Intervention Not Indicated  Housing Interventions Intervention Not Indicated  Transportation Interventions Patient Resources (Friends/Family), Intervention Not Indicated  Utilities Interventions Intervention Not Indicated    Partick Musselman J. Joniqua Sidle RN, MSN [facility]  [facility], [facility] RN Care Manager Direct Dial: [phone]  Fax: [phone] Website: [email or web address]   "

## 2024-06-29 NOTE — Patient Instructions (Signed)
 Visit Information  Thank you for taking time to visit with me today. Please don't hesitate to contact me if I can be of assistance to you before our next scheduled telephone appointment.  Our next appointment is by telephone on 07/06/24 at 1130 am  Following is a copy of your care plan:   Goals Addressed             This Visit's Progress    [facility] Transitions of Care (TOC) Care Plan       Problems:  Recent Hospitalization for treatment of Pneumonia and Atrial. Fibrillation Knowledge Deficit Related to Pneumonia, Atrial Fibrillation, Lung Cancer and No Hospital Follow Up Provider appointment Patient declined CM request to schedule PCP appointment at this time  Goal:  Over the next 30 days, the patient will not experience hospital readmission  Interventions:  Transitions of Care: 06/29/24 spoke with patient.  He states he does not feel good but no worse.  He has antibiotics and taking as ordered.  Spouse assists with ADL's. Patient with daily radiation that will start 07-12-24.  Oncology appointment on 06/30/24.  [facility] home [facility] ordered.   Doctor Visits  - discussed the importance of doctor visits Communication with PCP re: 30 TOC enrollment Post discharge activity limitations prescribed by provider reviewed Reviewed Signs and symptoms of infection  AFIB Interventions:   Counseled on increased risk of stroke due to Afib and benefits of anticoagulation for stroke prevention Reviewed importance of adherence to anticoagulant exactly as prescribed Screening for signs and symptoms of depression related to chronic disease state  Assessed social determinant of health barriers Discussed: Watch for bleeding while on Blood Thinners--watch for blood in your stool which can make your stool black, maroon, mahogany or red---, blood in your urine which can make your urine pink or red, nosebleeds, also watch for possible bruising   Patient Self Care Activities:  Attend all scheduled provider  appointments Call pharmacy for medication refills 3-7 days in advance of running out of medications Call provider office for new concerns or questions  Notify RN Care Manager of TOC call rescheduling needs Participate in Transition of Care Program/Attend TOC scheduled calls Take medications as prescribed   Monitor for:   new or higher fever. coughing more deeply or more often. increased shortness of breath. If you have any of these symptoms please seek medical attention   Plan:  The patient has been provided with contact information for the care management team and has been advised to call with any health related questions or concerns.         Patient verbalizes understanding of instructions and care plan provided today and agrees to view in MyChart. Active MyChart status and patient understanding of how to access instructions and care plan via MyChart confirmed with patient.     The patient has been provided with contact information for the care management team and has been advised to call with any health related questions or concerns.   Please call the care guide team at [phone] if you need to cancel or reschedule your appointment.   Please call the Suicide and Crisis Lifeline: 988 if you are experiencing a Mental Health or Behavioral Health Crisis or need someone to talk to.  Leul Narramore J. Natlie Asfour RN, MSN [facility], [facility] RN Care Manager Direct Dial: [phone]  Fax: [phone] Website: [email or web address]

## 2024-06-29 NOTE — Progress Notes (Signed)
 Transition of Care Encounter Data   Call attempt: 1 Admission date: 06/25/24 Discharge date: 06/28/24 Discharge diagnosis: Paroxysmal atrial fibrillation with rapid ventricular response/ pneumonia Do you have a hospital follow up appointment?: Yes - Within 14 Days, Yes with Specialty Provider clinic F/U Date: 06/29/24 F/U Provider: Letty Mertie Bobette Mickey., MD Patient post discharge: Medications:         [facility]: [phone] Hover: [phone]:    Other: Contact PCP:      [facility] TRANSITIONAL CASE MANAGEMENT SUMMARY NOTE   Attempted to contact patient today at Home to complete Transitional Case Management call from [facility]. Left message for patient to return call; direct phone number included in message left for patient; 1st attempt.            [place]ston DELENA Lower, RN

## 2024-07-01 ENCOUNTER — Other Ambulatory Visit: Payer: Self-pay | Admitting: Family Medicine

## 2024-07-01 DIAGNOSIS — R051 Acute cough: Secondary | ICD-10-CM

## 2024-07-01 DIAGNOSIS — R062 Wheezing: Secondary | ICD-10-CM

## 2024-07-01 DIAGNOSIS — J441 Chronic obstructive pulmonary disease with (acute) exacerbation: Secondary | ICD-10-CM

## 2024-07-04 ENCOUNTER — Encounter (HOSPITAL_COMMUNITY): Payer: Self-pay

## 2024-07-04 ENCOUNTER — Inpatient Hospital Stay: Admit: 2024-07-04 | Admitting: Internal Medicine

## 2024-07-06 ENCOUNTER — Other Ambulatory Visit: Payer: Self-pay

## 2024-07-06 ENCOUNTER — Inpatient Hospital Stay (HOSPITAL_COMMUNITY)
Admission: EM | Admit: 2024-07-06 | Discharge: 2024-07-14 | DRG: 871 | Disposition: A | Discharge status: Home Health | Attending: Internal Medicine | Admitting: Internal Medicine

## 2024-07-06 ENCOUNTER — Encounter (HOSPITAL_COMMUNITY): Payer: Self-pay | Admitting: Pulmonary Disease

## 2024-07-06 ENCOUNTER — Telehealth: Payer: Self-pay

## 2024-07-06 ENCOUNTER — Inpatient Hospital Stay (HOSPITAL_COMMUNITY)

## 2024-07-06 DIAGNOSIS — I5023 Acute on chronic systolic (congestive) heart failure: Secondary | ICD-10-CM | POA: Diagnosis present

## 2024-07-06 DIAGNOSIS — I110 Hypertensive heart disease with heart failure: Secondary | ICD-10-CM | POA: Diagnosis present

## 2024-07-06 DIAGNOSIS — R570 Cardiogenic shock: Secondary | ICD-10-CM | POA: Diagnosis present

## 2024-07-06 DIAGNOSIS — R0602 Shortness of breath: Secondary | ICD-10-CM | POA: Diagnosis present

## 2024-07-06 DIAGNOSIS — Z66 Do not resuscitate: Secondary | ICD-10-CM | POA: Diagnosis present

## 2024-07-06 DIAGNOSIS — R636 Underweight: Secondary | ICD-10-CM | POA: Diagnosis present

## 2024-07-06 DIAGNOSIS — I447 Left bundle-branch block, unspecified: Secondary | ICD-10-CM | POA: Diagnosis present

## 2024-07-06 DIAGNOSIS — D649 Anemia, unspecified: Secondary | ICD-10-CM | POA: Diagnosis present

## 2024-07-06 DIAGNOSIS — Z8616 Personal history of COVID-19: Secondary | ICD-10-CM

## 2024-07-06 DIAGNOSIS — A419 Sepsis, unspecified organism: Principal | ICD-10-CM | POA: Diagnosis present

## 2024-07-06 DIAGNOSIS — Z681 Body mass index (BMI) 19 or less, adult: Secondary | ICD-10-CM | POA: Diagnosis not present

## 2024-07-06 DIAGNOSIS — Z8269 Family history of other diseases of the musculoskeletal system and connective tissue: Secondary | ICD-10-CM

## 2024-07-06 DIAGNOSIS — J910 Malignant pleural effusion: Secondary | ICD-10-CM | POA: Diagnosis present

## 2024-07-06 DIAGNOSIS — I959 Hypotension, unspecified: Secondary | ICD-10-CM | POA: Diagnosis not present

## 2024-07-06 DIAGNOSIS — I2510 Atherosclerotic heart disease of native coronary artery without angina pectoris: Secondary | ICD-10-CM | POA: Diagnosis present

## 2024-07-06 DIAGNOSIS — Z811 Family history of alcohol abuse and dependence: Secondary | ICD-10-CM

## 2024-07-06 DIAGNOSIS — R6521 Severe sepsis with septic shock: Secondary | ICD-10-CM | POA: Diagnosis present

## 2024-07-06 DIAGNOSIS — Z923 Personal history of irradiation: Secondary | ICD-10-CM

## 2024-07-06 DIAGNOSIS — Z87891 Personal history of nicotine dependence: Secondary | ICD-10-CM | POA: Diagnosis not present

## 2024-07-06 DIAGNOSIS — I480 Paroxysmal atrial fibrillation: Secondary | ICD-10-CM | POA: Diagnosis present

## 2024-07-06 DIAGNOSIS — M810 Age-related osteoporosis without current pathological fracture: Secondary | ICD-10-CM | POA: Diagnosis present

## 2024-07-06 DIAGNOSIS — J9621 Acute and chronic respiratory failure with hypoxia: Secondary | ICD-10-CM | POA: Diagnosis present

## 2024-07-06 DIAGNOSIS — E039 Hypothyroidism, unspecified: Secondary | ICD-10-CM | POA: Diagnosis present

## 2024-07-06 DIAGNOSIS — J9601 Acute respiratory failure with hypoxia: Secondary | ICD-10-CM

## 2024-07-06 DIAGNOSIS — J440 Chronic obstructive pulmonary disease with acute lower respiratory infection: Secondary | ICD-10-CM | POA: Diagnosis present

## 2024-07-06 DIAGNOSIS — Z79899 Other long term (current) drug therapy: Secondary | ICD-10-CM

## 2024-07-06 DIAGNOSIS — Z7901 Long term (current) use of anticoagulants: Secondary | ICD-10-CM | POA: Diagnosis not present

## 2024-07-06 DIAGNOSIS — J189 Pneumonia, unspecified organism: Secondary | ICD-10-CM | POA: Diagnosis present

## 2024-07-06 DIAGNOSIS — L89151 Pressure ulcer of sacral region, stage 1: Secondary | ICD-10-CM | POA: Diagnosis present

## 2024-07-06 DIAGNOSIS — I5021 Acute systolic (congestive) heart failure: Secondary | ICD-10-CM

## 2024-07-06 DIAGNOSIS — J439 Emphysema, unspecified: Secondary | ICD-10-CM | POA: Diagnosis present

## 2024-07-06 DIAGNOSIS — C342 Malignant neoplasm of middle lobe, bronchus or lung: Secondary | ICD-10-CM | POA: Diagnosis present

## 2024-07-06 DIAGNOSIS — J9611 Chronic respiratory failure with hypoxia: Secondary | ICD-10-CM | POA: Diagnosis not present

## 2024-07-06 DIAGNOSIS — E785 Hyperlipidemia, unspecified: Secondary | ICD-10-CM | POA: Diagnosis present

## 2024-07-06 DIAGNOSIS — J90 Pleural effusion, not elsewhere classified: Secondary | ICD-10-CM | POA: Diagnosis not present

## 2024-07-06 DIAGNOSIS — K219 Gastro-esophageal reflux disease without esophagitis: Secondary | ICD-10-CM | POA: Diagnosis present

## 2024-07-06 DIAGNOSIS — I5030 Unspecified diastolic (congestive) heart failure: Secondary | ICD-10-CM | POA: Diagnosis not present

## 2024-07-06 DIAGNOSIS — I429 Cardiomyopathy, unspecified: Secondary | ICD-10-CM | POA: Diagnosis present

## 2024-07-06 DIAGNOSIS — J449 Chronic obstructive pulmonary disease, unspecified: Secondary | ICD-10-CM | POA: Diagnosis not present

## 2024-07-06 DIAGNOSIS — R579 Shock, unspecified: Secondary | ICD-10-CM | POA: Diagnosis not present

## 2024-07-06 DIAGNOSIS — R5381 Other malaise: Secondary | ICD-10-CM | POA: Diagnosis present

## 2024-07-06 DIAGNOSIS — Z800 Family history of malignant neoplasm of digestive organs: Secondary | ICD-10-CM

## 2024-07-06 DIAGNOSIS — K5900 Constipation, unspecified: Secondary | ICD-10-CM | POA: Diagnosis present

## 2024-07-06 DIAGNOSIS — R578 Other shock: Secondary | ICD-10-CM | POA: Diagnosis not present

## 2024-07-06 DIAGNOSIS — Z9981 Dependence on supplemental oxygen: Secondary | ICD-10-CM

## 2024-07-06 DIAGNOSIS — I4891 Unspecified atrial fibrillation: Secondary | ICD-10-CM | POA: Diagnosis not present

## 2024-07-06 LAB — CBC
HCT: 30.1 % — ABNORMAL LOW (ref 39.0–52.0)
Hemoglobin: 9.9 g/dL — ABNORMAL LOW (ref 13.0–17.0)
MCH: 28.9 pg (ref 26.0–34.0)
MCHC: 32.9 g/dL (ref 30.0–36.0)
MCV: 88 fL (ref 80.0–100.0)
Platelets: 153 K/uL (ref 150–400)
RBC: 3.42 MIL/uL — ABNORMAL LOW (ref 4.22–5.81)
RDW: 14.4 % (ref 11.5–15.5)
WBC: 3.6 K/uL — ABNORMAL LOW (ref 4.0–10.5)
nRBC: 0 % (ref 0.0–0.2)

## 2024-07-06 LAB — BODY FLUID CELL COUNT WITH DIFFERENTIAL
Eos, Fluid: 0 %
Lymphs, Fluid: 78 %
Monocyte-Macrophage-Serous Fluid: 16 % — ABNORMAL LOW (ref 50–90)
Neutrophil Count, Fluid: 5 % (ref 0–25)
Other Cells, Fluid: 1 %
Total Nucleated Cell Count, Fluid: 81 uL (ref 0–1000)

## 2024-07-06 LAB — GLUCOSE, CAPILLARY: Glucose-Capillary: 115 mg/dL — ABNORMAL HIGH (ref 70–99)

## 2024-07-06 LAB — BASIC METABOLIC PANEL WITH GFR
Anion gap: 8 (ref 5–15)
BUN: 22 mg/dL (ref 8–23)
CO2: 29 mmol/L (ref 22–32)
Calcium: 8 mg/dL — ABNORMAL LOW (ref 8.9–10.3)
Chloride: 98 mmol/L (ref 98–111)
Creatinine, Ser: 1.04 mg/dL (ref 0.61–1.24)
GFR, Estimated: >60 mL/min
Glucose, Bld: 133 mg/dL — ABNORMAL HIGH (ref 70–99)
Potassium: 4 mmol/L (ref 3.5–5.1)
Sodium: 135 mmol/L (ref 135–145)

## 2024-07-06 LAB — PROTEIN, TOTAL: Total Protein: 6.4 g/dL — ABNORMAL LOW (ref 6.5–8.1)

## 2024-07-06 LAB — APTT: aPTT: 156 s — ABNORMAL HIGH (ref 24–36)

## 2024-07-06 LAB — MRSA NEXT GEN BY PCR, NASAL: MRSA by PCR Next Gen: NOT DETECTED

## 2024-07-06 LAB — MAGNESIUM: Magnesium: 1.3 mg/dL — ABNORMAL LOW (ref 1.7–2.4)

## 2024-07-06 LAB — LACTATE DEHYDROGENASE: LDH: 273 U/L — ABNORMAL HIGH (ref 105–235)

## 2024-07-06 LAB — HEPARIN LEVEL (UNFRACTIONATED): Heparin Unfractionated: >1.1 [IU]/mL — ABNORMAL HIGH (ref 0.30–0.70)

## 2024-07-06 MED ORDER — BUDESONIDE 0.5 MG/2ML IN SUSP
0.5000 mg | Freq: Two times a day (BID) | RESPIRATORY_TRACT | Status: DC
  Administered 2024-07-06 – 2024-07-14 (×15): 0.5 mg via RESPIRATORY_TRACT
  Filled 2024-07-06 (×16): qty 2

## 2024-07-06 MED ORDER — SENNA 8.6 MG PO TABS
1.0000 | ORAL_TABLET | Freq: Two times a day (BID) | ORAL | Status: DC | PRN
  Administered 2024-07-07: 8.6 mg via ORAL
  Filled 2024-07-06: qty 1

## 2024-07-06 MED ORDER — HEPARIN SODIUM (PORCINE) 5000 UNIT/ML IJ SOLN: 5000.0000 [IU] | Freq: Three times a day (TID) | INTRAMUSCULAR | Status: DC

## 2024-07-06 MED ORDER — REVEFENACIN 175 MCG/3ML IN SOLN
175.0000 ug | Freq: Every day | RESPIRATORY_TRACT | Status: DC
  Administered 2024-07-07 – 2024-07-14 (×7): 175 ug via RESPIRATORY_TRACT
  Filled 2024-07-06 (×8): qty 3

## 2024-07-06 MED ORDER — MAGNESIUM SULFATE 2 GM/50ML IV SOLN
2.0000 g | Freq: Once | INTRAVENOUS | Status: AC
  Administered 2024-07-06: 2 g via INTRAVENOUS
  Filled 2024-07-06: qty 50

## 2024-07-06 MED ORDER — ARFORMOTEROL TARTRATE 15 MCG/2ML IN NEBU
15.0000 ug | INHALATION_SOLUTION | Freq: Two times a day (BID) | RESPIRATORY_TRACT | Status: DC
  Administered 2024-07-06 – 2024-07-14 (×15): 15 ug via RESPIRATORY_TRACT
  Filled 2024-07-06 (×17): qty 2

## 2024-07-06 MED ORDER — LEVOTHYROXINE SODIUM 50 MCG PO TABS
50.0000 ug | ORAL_TABLET | Freq: Every day | ORAL | Status: DC
  Administered 2024-07-06 – 2024-07-14 (×9): 50 ug via ORAL
  Filled 2024-07-06 (×9): qty 1

## 2024-07-06 MED ORDER — POLYETHYLENE GLYCOL 3350 17 G PO PACK
17.0000 g | PACK | Freq: Every day | ORAL | Status: DC | PRN
  Administered 2024-07-07: 17 g via ORAL
  Filled 2024-07-06: qty 1

## 2024-07-06 MED ORDER — LACTATED RINGERS IV SOLN: INTRAVENOUS | Status: DC

## 2024-07-06 MED ORDER — MAGNESIUM SULFATE 4 GM/100ML IV SOLN
4.0000 g | Freq: Once | INTRAVENOUS | Status: AC
  Administered 2024-07-06: 4 g via INTRAVENOUS
  Filled 2024-07-06: qty 100

## 2024-07-06 MED ORDER — NOREPINEPHRINE 4 MG/250ML-% IV SOLN
0.0000 ug/min | INTRAVENOUS | Status: DC
  Administered 2024-07-06: 10 ug/min via INTRAVENOUS
  Administered 2024-07-06: 8 ug/min via INTRAVENOUS
  Administered 2024-07-07: 14 ug/min via INTRAVENOUS
  Administered 2024-07-07: 20 ug/min via INTRAVENOUS
  Administered 2024-07-07: 10 ug/min via INTRAVENOUS
  Administered 2024-07-07: 11 ug/min via INTRAVENOUS
  Administered 2024-07-07: 18 ug/min via INTRAVENOUS
  Administered 2024-07-08: 7 ug/min via INTRAVENOUS
  Administered 2024-07-08: 18 ug/min via INTRAVENOUS
  Administered 2024-07-08: 9 ug/min via INTRAVENOUS
  Administered 2024-07-09: 8 ug/min via INTRAVENOUS
  Administered 2024-07-09 – 2024-07-10 (×2): 5 ug/min via INTRAVENOUS
  Filled 2024-07-06: qty 500
  Filled 2024-07-06 (×10): qty 250

## 2024-07-06 MED ORDER — CHLORHEXIDINE GLUCONATE CLOTH 2 % EX PADS
6.0000 | MEDICATED_PAD | Freq: Every day | CUTANEOUS | Status: DC
  Administered 2024-07-06 – 2024-07-14 (×9): 6 via TOPICAL

## 2024-07-06 MED ORDER — HEPARIN (PORCINE) 25000 UT/250ML-% IV SOLN
600.0000 [IU]/h | INTRAVENOUS | Status: AC
  Administered 2024-07-06: 650 [IU]/h via INTRAVENOUS
  Administered 2024-07-08: 600 [IU]/h via INTRAVENOUS
  Administered 2024-07-10: 550 [IU]/h via INTRAVENOUS
  Administered 2024-07-12: 700 [IU]/h via INTRAVENOUS
  Filled 2024-07-06 (×3): qty 250

## 2024-07-06 MED ORDER — AMIODARONE HCL 200 MG PO TABS
200.0000 mg | ORAL_TABLET | Freq: Every day | ORAL | Status: DC
  Administered 2024-07-06 – 2024-07-14 (×9): 200 mg via ORAL
  Filled 2024-07-06 (×9): qty 1

## 2024-07-06 MED ORDER — HEPARIN (PORCINE) 25000 UT/250ML-% IV SOLN
800.0000 [IU]/h | INTRAVENOUS | Status: DC
  Administered 2024-07-06: 800 [IU]/h via INTRAVENOUS
  Filled 2024-07-06: qty 250

## 2024-07-06 MED ORDER — TRAZODONE HCL 50 MG PO TABS
100.0000 mg | ORAL_TABLET | Freq: Every day | ORAL | Status: DC
  Administered 2024-07-06 – 2024-07-13 (×8): 100 mg via ORAL
  Filled 2024-07-06 (×8): qty 2

## 2024-07-06 MED ORDER — FLUTICASONE PROPIONATE 50 MCG/ACT NA SUSP
1.0000 | Freq: Every day | NASAL | Status: DC
  Administered 2024-07-06 – 2024-07-14 (×8): 1 via NASAL
  Filled 2024-07-06 (×3): qty 16

## 2024-07-06 NOTE — Plan of Care (Signed)
" °  Problem: Education: Goal: Knowledge of General Education information will improve Description: Including pain rating scale, medication(s)/side effects and non-pharmacologic comfort measures Outcome: Not Progressing   Problem: Health Behavior/Discharge Planning: Goal: Ability to manage health-related needs will improve Outcome: Not Progressing   Problem: Clinical Measurements: Goal: Ability to maintain clinical measurements within normal limits will improve Outcome: Not Progressing Goal: Will remain free from infection Outcome: Not Progressing Goal: Diagnostic test results will improve Outcome: Not Progressing Goal: Respiratory complications will improve Outcome: Not Progressing Goal: Cardiovascular complication will be avoided Outcome: Not Progressing   Problem: Activity: Goal: Risk for activity intolerance will decrease Outcome: Not Progressing   Problem: Nutrition: Goal: Adequate nutrition will be maintained Outcome: Not Progressing   Problem: Coping: Goal: Level of anxiety will decrease Outcome: Not Progressing   Problem: Elimination: Goal: Will not experience complications related to bowel motility Outcome: Not Progressing Goal: Will not experience complications related to urinary retention Outcome: Not Progressing   Problem: Pain Managment: Goal: General experience of comfort will improve and/or be controlled Outcome: Not Progressing   Problem: Safety: Goal: Ability to remain free from injury will improve Outcome: Not Progressing   Problem: Skin Integrity: Goal: Risk for impaired skin integrity will decrease Outcome: Not Progressing   Problem: Education: Goal: Knowledge of the prescribed therapeutic regimen will improve Outcome: Not Progressing   Problem: Activity: Goal: Ability to perform activities at highest level will improve Outcome: Not Progressing   Problem: Bowel/Gastric: Goal: Occurrences of nausea will decrease Outcome: Not  Progressing Goal: Bowel function will improve Outcome: Not Progressing   Problem: Nutritional: Goal: Maintenance of adequate nutrition will improve Outcome: Not Progressing   Problem: Clinical Measurements: Goal: Will remain free from infection Outcome: Not Progressing   Problem: Skin Integrity: Goal: Status of oral mucous membranes will improve Outcome: Not Progressing   Problem: Education: Goal: Knowledge of the prescribed therapy will improve Outcome: Not Progressing   Problem: Bowel/Gastric: Goal: Occurrences of nausea, vomiting, and/or diarrhea will decrease Outcome: Not Progressing   Problem: Coping: Goal: Level of anxiety will decrease Outcome: Not Progressing Goal: Ability to identify and develop effective coping behavior will improve Outcome: Not Progressing   Problem: Nutritional: Goal: Will achieve and/or maintain adequate nutritional intake Outcome: Not Progressing   Problem: Skin Integrity: Goal: Risk for impaired skin integrity will decrease Outcome: Not Progressing   Problem: Urinary Elimination: Goal: Complications of radiation-induced cystitis will be minimized/avoided Outcome: Not Progressing   Problem: Fatigue: Goal: Expression of feelings of increased energy will improve Outcome: Not Progressing   Problem: Education: Goal: Knowledge of disease or condition will improve Outcome: Not Progressing Goal: Understanding of medication regimen will improve Outcome: Not Progressing Goal: Individualized Educational Video(s) Outcome: Not Progressing   Problem: Activity: Goal: Ability to tolerate increased activity will improve Outcome: Not Progressing   Problem: Cardiac: Goal: Ability to achieve and maintain adequate cardiopulmonary perfusion will improve Outcome: Not Progressing   Problem: Health Behavior/Discharge Planning: Goal: Ability to safely manage health-related needs after discharge will improve Outcome: Not Progressing   "

## 2024-07-06 NOTE — Progress Notes (Signed)
 [facility] ADULT ICU REPLACEMENT PROTOCOL   The patient does apply for the [facility] Adult ICU Electrolyte Replacment Protocol based on the criteria listed below:   1.Exclusion criteria: TCTS, ECMO, Dialysis, and Myasthenia Gravis patients 2. Is GFR >/= 30 ml/min? Yes.    Patient's GFR today is >60 3. Is SCr </= 2? Yes.   Patient's SCr is 1.04 mg/dL 4. Did SCr increase >/= 0.5 in 24 hours? No. 5.Pt's weight >40kg  Yes.   6. Abnormal electrolyte(s): mag 1.3  7. Electrolytes replaced per protocol 8.  Call MD STAT for K+ </= 2.5, Phos </= 1, or Mag </= 1 Physician:    Christorpher Hisaw A 07/06/2024 7:55 PM

## 2024-07-06 NOTE — Progress Notes (Signed)
 PHARMACY - ANTICOAGULATION CONSULT NOTE  Pharmacy Consult:  Heparin  Indication: atrial fibrillation  Allergies[1]  Patient Measurements: Height: 5' 10 (177.8 cm) Weight: 55.3 kg (121 lb 14.6 oz) IBW/kg (Calculated) : 73 HEPARIN  DW (KG): 55.3  Vital Signs: Temp: 98 F (36.7 C) (01/14 0941) Temp Source: Oral (01/14 0941) BP: 113/66 (01/14 1000) Pulse Rate: 111 (01/14 1000)  Labs: No results for input(s): HGB, HCT, PLT, APTT, LABPROT, INR, HEPARINUNFRC, HEPRLOWMOCWT, CREATININE, CKTOTAL, CKMB, TROPONINIHS in the last 72 hours.  CrCl cannot be calculated (Patient's most recent lab result is older than the maximum 21 days allowed.).   Medical History: Past Medical History:  Diagnosis Date   Arthritis    Carotid artery stenosis without cerebral infarction    vascular --- dr eliza;   09-24-2007  s/p right CEA   Chronic cholecystitis    w/  large calcified stone in gallbladder   Chronic constipation    COPD (chronic obstructive pulmonary disease) (HCC)    followed by pcp----  (05-28-2021  pt stated last used rescue 05-27-2021 for wheezing resolved after use, stated exacerbation approx  greater than a year ago)   Coronary artery disease 2002   cardiologist-- dr lavona ;  (05-28-2021  pt denies need to use nitro for CP) per last cath 08-04-2017  LM luminal irregularities, LAD 30% ,D1 ostial 35% ,CFx   AV groove ostial 25% , OM1 long prox 30%,  proxRCA occluded and dominant,   excellent left to right collateral filling.   Depression    Patient denies   DOE (dyspnea on exertion)    due to smoking/ copd   Dyslipidemia    Essential hypertension    Full dentures    GERD (gastroesophageal reflux disease)    History of adenomatous polyp of colon    History of COVID-19    per pt winter 2022, mild to moderate symptoms that resolved   History of diverticulitis of colon    History of kidney stones    Hyperlipidemia    Hypothyroidism    followed by pcp    Lung nodule    s/p bronchoscopy w/ bx in 2005,  benign   Osteoporosis    Smokers' cough (HCC)    05-28-2021  pt stated occasionally productive w/ white phlegm   Wears glasses       Assessment: 73 YOM with lung cancer transferred from [facility] to [facility] for thoracentesis.  Patient has a history of Afib on Eliquis  PTA.  Last Eliquis  dose documented on 1/13  AM per [facility] report upon transfer.  Pharmacy consulted to manage IV heparin .    Labs from OSH:  SCr 1.07, hgb 8.8, plts 139 (improving)  Goal of Therapy:  Heparin  level 0.3-0.7 units/ml aPTT 66-102 seconds Monitor platelets by anticoagulation protocol: Yes   Plan:  After thoracentesis, start IV heparin  at 800 units/hr Check 8 hr aPTT and heparin  level Daily heparin  level and CBC - order daily aPTT if needed  Yamira Papa D. Lendell, PharmD, BCPS, BCCCP 07/06/2024, 11:22 AM     [1]  Allergies Allergen Reactions   Flomax  [Tamsulosin  Hcl] Other (See Comments)    Made patient hypotensive, clammy, sweaty, and feel like he was going to pass out (EMS had to be called)   Crestor [Rosuvastatin Calcium ] Other (See Comments)    Lethargy and muscle aches   Lipitor [Atorvastatin Calcium ] Other (See Comments)    Body cramps, lethargy, and muscle aches

## 2024-07-06 NOTE — H&P (Addendum)
 "  NAME:  [name] [name], MRN:  [id number], DOB:  September 30, 1950, LOS: 0 ADMISSION DATE:  07/06/2024, CONSULTATION DATE:  07/06/24 REFERRING MD:  UNK SAUNDERS CHIEF COMPLAINT:  Dyspnea   History of Present Illness:  [name] [name] is a 74 y.o. male who has a PMH as below including but not limited to right NSCLC first diagnosed 04/25/24 after bx via bronchoscopy and currently undergoing chemo (Carboplatin/Paclitaxel weekly) and XRT under the care of Dr. Letty at [facility] (started 06/06/24), Emphysema, R effusion unknown if had thoracentesis 06/27/24 (no studies sent), A.fib on Apixaban .  He was admitted to [facility] 07/04/24 with dyspnea. CTA was negative for PE but he  was found to have a right effusion and atlectasis. A right thora was planned but was aborted due to increased WOB and diaphragmatic excursion into the field of view causing limited safe window per notes in care everywhere. It was recommended that he be transferred to a tertiary center for further evaluation.  On 1/14, he was transferred to [facility] He has been on NE for BP support after receiving Lasix in the ED at [facility] with resultant hypotension.  Pertinent  Medical History:  has HLD (hyperlipidemia); TOBACCO ABUSE; Coronary atherosclerosis; Occlusion and stenosis of carotid artery without mention of cerebral infarction; ORGANIC IMPOTENCE; Hypothyroidism; Chronic idiopathic constipation; Coronary artery disease involving native coronary artery of native heart with angina pectoris; Essential hypertension; GERD (gastroesophageal reflux disease); COPD (chronic obstructive pulmonary disease) (HCC); Chronic calculous cholecystitis; Bilateral carotid artery stenosis; Myalgia due to statin; Anxiety disorder with panic attacks; Hilar mass; Mediastinal adenopathy; Atrial fibrillation, new onset (HCC); Malignant neoplasm of middle lobe of right lung (HCC); and Shock (HCC) on their problem list.  Significant Hospital Events: Including procedures, antibiotic  start and stop dates in addition to other pertinent events   1/12 admit to [facility] 1/14 transfer to [facility]  Interim History / Subjective:  Comfortable, has some dyspnea with deep breaths. Asking for coffee.  Objective:  Blood pressure 113/66, pulse (!) 111, temperature 98 F (36.7 C), temperature source Oral, resp. rate (!) 30, height 5' 10 (1.778 m), weight 55.3 kg, SpO2 97%.        Intake/Output Summary (Last 24 hours) at 07/06/2024 1049 Last data filed at 07/06/2024 0947 Gross per 24 hour  Intake --  Output 200 ml  Net -200 ml   Filed Weights   07/06/24 0941  Weight: 55.3 kg     Physical Exam: General: Adult male, resting in bed, in NAD. Neuro: A&O x 3, no deficits. HEENT: [place]/AT. Sclerae anicteric. EOMI. Cardiovascular: IRIR, no M/R/G.  Lungs: Respirations even and unlabored.  Diminished on right with dullness to percussion in base especially. Abdomen: BS x 4, soft, NT/ND.  Musculoskeletal: No gross deformities, no edema.   Labs/imaging personally reviewed:  CTA chest 1/13 (from [facility]) > no PE, large R pleural effusion with atx, right hillar mass with RML collapse.  Assessment & Plan:   R pleural effusion - appears recurrent and per care everywhere, he had a thoracentesis on 06/27/24 though it doesn't appear that any studies were sent at the time (performed at [facility]). POCUS today 1/14 did confirm large effusion on right with compressive atelectasis. - Will perform thoracentesis and send for routine labs including cytology.  Acute hypoxic respiratory failure - 2/2 above. Hx emphysema. - Continue supplemental O2 as needed to maintain SpO2 > 92%. - Triple therapy nebs in lieu of PTA Breztri .  Hx NSCLC - first diagnosed  04/25/24 after bx via bronchoscopy and currently undergoing chemo (Carboplatin/Paclitaxel weekly) and XRT under the care of Dr. Letty at [facility] (started 06/06/24). - Continue care as an outpatient.  Hypotension - after receiving Lasix in ED. -  Continue NE as needed for goal MAP > 65. - Wean NE to off. - Consider short course Midodrine to get off NE. - Gentle fluids.  Hx A.fib on Apixaban , sCHF (echo from Nov 2025 with EF 40-45% though poor windows, indeterminate diastolic filling, small pericardial effusion), HTN. - Hold PTA Apixaban . - Heparin  gtt in lieu of PTA Apixaban . - Continue PTA Amiodarone . - Hold PTA Valsartan .  Hx hypothyroidism. - Continue PTA Synthroid .  DNR/DNI - established prior to admission, DNR form sent with pt upon transfer to our facility. - DNR/DNI orders in place.   Labs   CBC: No results for input(s): WBC, NEUTROABS, HGB, HCT, MCV, PLT in the last 168 hours.  Basic Metabolic Panel: No results for input(s): NA, K, CL, CO2, GLUCOSE, BUN, CREATININE, CALCIUM , MG, PHOS in the last 168 hours. GFR: CrCl cannot be calculated (Patient's most recent lab result is older than the maximum 21 days allowed.). No results for input(s): PROCALCITON, WBC, LATICACIDVEN in the last 168 hours.  Liver Function Tests: No results for input(s): AST, ALT, ALKPHOS, BILITOT, PROT, ALBUMIN  in the last 168 hours. No results for input(s): LIPASE, AMYLASE in the last 168 hours. No results for input(s): AMMONIA in the last 168 hours.  ABG    Component Value Date/Time   HCO3 27.3 01/29/2024 1850   TCO2 27 05/30/2021 0810   O2SAT 63.7 01/29/2024 1850     Coagulation Profile: No results for input(s): INR, PROTIME in the last 168 hours.  Cardiac Enzymes: No results for input(s): CKTOTAL, CKMB, CKMBINDEX, TROPONINI in the last 168 hours.  HbA1C: HB A1C (BAYER DCA - WAIVED)  Date/Time Value Ref Range Status  01/07/2016 09:00 AM 5.9 <7.0 % Final    Comment:                                          Diabetic Adult            <7.0                                       Healthy Adult        4.3 - 5.7                                                            (DCCT/NGSP) American Diabetes Association's Summary of Glycemic Recommendations for Adults with Diabetes: Hemoglobin A1c <7.0%. More stringent glycemic goals (A1c <6.0%) may further reduce complications at the cost of increased risk of hypoglycemia.    Hgb A1c MFr Bld  Date/Time Value Ref Range Status  10/02/2023 08:39 AM 6.1 (H) 4.8 - 5.6 % Final    Comment:             Prediabetes: 5.7 - 6.4          Diabetes: >6.4          Glycemic control  for adults with diabetes: <7.0   07/13/2017 09:30 AM 5.9 (H) 4.8 - 5.6 % Final    Comment:             Prediabetes: 5.7 - 6.4          Diabetes: >6.4          Glycemic control for adults with diabetes: <7.0     CBG: No results for input(s): GLUCAP in the last 168 hours.  Review of Systems:   All negative; except for those that are bolded, which indicate positives.  Constitutional: weight loss, weight gain, night sweats, fevers, chills, fatigue, weakness.  HEENT: headaches, sore throat, sneezing, nasal congestion, post nasal drip, difficulty swallowing, tooth/dental problems, visual complaints, visual changes, ear aches. Neuro: difficulty with speech, weakness, numbness, ataxia. CV:  chest pain, orthopnea, PND, swelling in lower extremities, dizziness, palpitations, syncope.  Resp: cough, hemoptysis, dyspnea, wheezing. GI: heartburn, indigestion, abdominal pain, nausea, vomiting, diarrhea, constipation, change in bowel habits, loss of appetite, hematemesis, melena, hematochezia.  GU: dysuria, change in color of urine, urgency or frequency, flank pain, hematuria. MSK: joint pain or swelling, decreased range of motion. Psych: change in mood or affect, depression, anxiety, suicidal ideations, homicidal ideations. Skin: rash, itching, bruising.   Past Medical History:  He,  has a past medical history of Arthritis, Carotid artery stenosis without cerebral infarction, Chronic cholecystitis, Chronic constipation, COPD (chronic obstructive  pulmonary disease) (HCC), Coronary artery disease (2002), Depression, DOE (dyspnea on exertion), Dyslipidemia, Essential hypertension, Full dentures, GERD (gastroesophageal reflux disease), History of adenomatous polyp of colon, History of COVID-19, History of diverticulitis of colon, History of kidney stones, Hyperlipidemia, Hypothyroidism, Lung nodule, Osteoporosis, Smokers' cough (HCC), and Wears glasses.   Surgical History:   Past Surgical History:  Procedure Laterality Date   BRONCHIAL BIOPSY  04/25/2024   Procedure: BRONCHOSCOPY, WITH BIOPSY;  Surgeon: Shelah Lamar RAMAN, MD;  Location: [facility] ENDOSCOPY;  Service: Pulmonary;;   BRONCHIAL BRUSHINGS  04/25/2024   Procedure: BRONCHOSCOPY, WITH BRUSH BIOPSY;  Surgeon: Shelah Lamar RAMAN, MD;  Location: [facility] ENDOSCOPY;  Service: Pulmonary;;   BRONCHIAL NEEDLE ASPIRATION BIOPSY  04/25/2024   Procedure: BRONCHOSCOPY, WITH NEEDLE ASPIRATION BIOPSY;  Surgeon: Shelah Lamar RAMAN, MD;  Location: [facility] ENDOSCOPY;  Service: Pulmonary;;   BRONCHOSCOPY  2005   CARDIAC CATHETERIZATION  08/11/2000   @[facility]  by dr morris;   preserved LVF,  ostial RCA 70-75%, other nonobstructive disease   CARDIAC CATHETERIZATION  03/30/2008   @[facility]  by dr hochrein;  occluded RCA and other nonobstructive involving LAD, CFx, D1,OM1  and left to right collateral filling,  mild inferior hypokinesis,  ef 55%   CAROTID ENDARTERECTOMY Right 09/24/2007   @[facility]  by dr eliza   COLONOSCOPY  01/19/2017   by armsbruster   EXCISION NEUROMA Left 10/01/2005   @[facility] ;  left inguinal   EXTRACORPOREAL SHOCK WAVE LITHOTRIPSY  2015   INGUINAL HERNIA REPAIR Left    01-13-2003 and 07-22-2006 both @[facility]    LAPAROSCOPIC CHOLECYSTECTOMY SINGLE SITE WITH INTRAOPERATIVE CHOLANGIOGRAM N/A 05/30/2021   Procedure: LAPAROSCOPIC CHOLECYSTECTOMY SINGLE SITE WITH INTRAOPERATIVE CHOLANGIOGRAM;  Surgeon: Sheldon Standing, MD;  Location: [facility] ;  Service: General;  Laterality: N/A;   LAPAROSCOPIC INGUINAL HERNIA REPAIR  Bilateral 04/28/2007   @[facility]    LEFT HEART CATH AND CORONARY ANGIOGRAPHY N/A 08/04/2017   Procedure: LEFT HEART CATH AND CORONARY ANGIOGRAPHY;  Surgeon: Verlin Lonni BIRCH, MD;  Location: [facility] INVASIVE CV LAB;  Service: Cardiovascular;  Laterality: N/A;   THROAT SURGERY  1997   pt  stated removal of tumor that was benign   VIDEO BRONCHOSCOPY WITH ENDOBRONCHIAL ULTRASOUND Right 04/25/2024   Procedure: BRONCHOSCOPY, WITH EBUS;  Surgeon: Shelah Lamar RAMAN, MD;  Location: [facility] ENDOSCOPY;  Service: Pulmonary;  Laterality: Right;  Right hilar mass and suspected  lymph node involvement     Social History:   reports that he quit smoking about 5 months ago. His smoking use included cigarettes. He started smoking about a year ago. He has a 58.9 pack-year smoking history. He has never used smokeless tobacco. He reports that he does not currently use alcohol. He reports that he does not use drugs.   Family History:  His family history includes Alcohol abuse in his brother; Colon cancer (age of onset: 60) in his brother; Colon polyps in his brother; Healthy in his daughter, son, son, and son; Lung disease in his brother, brother, and father; Lupus in his brother and mother. There is no history of Esophageal cancer, Rectal cancer, Stomach cancer, Pancreatic cancer, Crohn's disease, or Ulcerative colitis.   Allergies Allergies[1]   Home Medications  Prior to Admission medications  Medication Sig Start Date End Date Taking? Authorizing Provider  albuterol  (PROVENTIL ) (2.5 MG/3ML) 0.083% nebulizer solution INHALE ONE vial by NEBULIZER EVERY 6 HOURS AS NEEDED FOR WHEEZING OR SHORTNESS OF BREATH 07/01/24   Dettinger, [name], MD  amiodarone  (PACERONE ) 200 MG tablet Take 400 mg by mouth 2 (two) times daily. Take 2 tablets (400 mg total) by mouth 2 (two) times a day with meals for 5 days, THEN 1 tablet (200 mg total) 2 (two) times a day with meals for 7 days, THEN 1 tablet (200 mg total) daily.    [provider]  apixaban  (ELIQUIS ) 2.5 MG TABS tablet Take 1 tablet (2.5 mg total) by mouth 2 (two) times daily. 06/03/24   Lavona Agent, MD  Bempedoic Acid -Ezetimibe  (NEXLIZET ) 180-10 MG TABS TAKE ONE TABLET DAILY 12/22/23   Dettinger, [name], MD  Bisoprolol  Fumarate 2.5 MG TABS Take 2.5 mg by mouth daily. 05/30/24   Miriam Norris, NP  budesonide -glycopyrrolate -formoterol  (BREZTRI  AEROSPHERE) 160-9-4.8 MCG/ACT AERO inhaler Inhale 2 puffs into the lungs in the morning and at bedtime. Patient not taking: Reported on 06/29/2024 01/31/24   Pearlean Manus, MD  fluticasone  (FLONASE ) 50 MCG/ACT nasal spray Place 2 sprays into both nostrils daily. 08/10/23   [place] Sebastian Pool, NP  hydrOXYzine  (VISTARIL ) 25 MG capsule TAKE ONE CAPSULE EVERY 8 HOURS AS NEEDED 05/12/24   Dettinger, [name], MD  levothyroxine  (SYNTHROID ) 50 MCG tablet Take 1 tablet (50 mcg total) by mouth daily before breakfast. 01/31/24   Pearlean Manus, MD  nitroGLYCERIN  (NITROSTAT ) 0.4 MG SL tablet Place 1 tablet (0.4 mg total) under the tongue every 5 (five) minutes as needed for chest pain. 02/26/24 05/30/97  Miriam Norris, NP  omeprazole  (PRILOSEC) 20 MG capsule Take 1 capsule (20 mg total) by mouth daily. 01/31/24   Pearlean Manus, MD  ondansetron  (ZOFRAN ) 8 MG tablet Take 8 mg by mouth every 8 (eight) hours as needed. 06/06/24   [provider]  OXYGEN  Inhale 2 L into the lungs daily.    [provider]  prochlorperazine (COMPAZINE) 10 MG tablet Take 10 mg by mouth every 8 (eight) hours as needed. 06/06/24   [provider]  traZODone  (DESYREL ) 100 MG tablet Take 1 tablet (100 mg total) by mouth at bedtime. 02/11/24   Dettinger, [name], MD  valsartan  (DIOVAN ) 80 MG tablet Take 80 mg by mouth daily. 05/13/24  [provider]     Critical care time: 30 min.   Sammi Gore, PA - C [place] [facility] For pager details, please see AMION or use Epic chat  After 1900, please  call [facility] for cross coverage needs 07/06/2024, 10:49 AM       [1]  Allergies Allergen Reactions   Flomax  [Tamsulosin  Hcl] Other (See Comments)    Made patient hypotensive, clammy, sweaty, and feel like he was going to pass out (EMS had to be called)   Crestor [Rosuvastatin Calcium ] Other (See Comments)    Lethargy and muscle aches   Lipitor [Atorvastatin Calcium ] Other (See Comments)    Body cramps, lethargy, and muscle aches   "

## 2024-07-06 NOTE — Procedures (Signed)
 Thoracentesis  Procedure Note  [name] [name]  [id number]  [birth year]  Date:07/06/2024  Time:12:18 PM   Provider Performing:Yassen Kinnett Ruthine   Procedure: Thoracentesis without imaging guidance (67445)  Indication(s) Pleural Effusion  Consent Risks of the procedure as well as the alternatives and risks of each were explained to the patient and/or caregiver.  Consent for the procedure was obtained and is signed in the bedside chart  Anesthesia Topical only with 1% lidocaine     Time Out Verified patient identification, verified procedure, site/side was marked, verified correct patient position, special equipment/implants available, medications/allergies/relevant history reviewed, required imaging and test results available.   Sterile Technique Maximal sterile technique including full sterile barrier drape, hand hygiene, sterile gown, sterile gloves, mask, hair covering, sterile ultrasound probe cover (if used).  Procedure Description Ultrasound was used to identify appropriate pleural anatomy for placement and overlying skin marked.  Area of drainage cleaned and draped in sterile fashion. Lidocaine  was used to anesthetize the skin and subcutaneous tissue.  1000 cc's of amber appearing fluid was drained from the right pleural space. Catheter then removed and bandaid applied to site.   Complications/Tolerance None; patient tolerated the procedure well. Chest X-ray is ordered to confirm no post-procedural complication.   EBL none   Specimen(s) Pleural fluid   Keven Ruthine, PA-C [place] [facility] For pager details, please see AMION or use Epic chat  After 1900, please call [facility] for cross coverage needs 07/06/2024, 12:19 PM

## 2024-07-06 NOTE — Progress Notes (Signed)
 Critical care attending attestation note:  Patient seen and examined and relevant ancillary tests reviewed.  I agree with the assessment and plan of care as outlined by [place]   Synopsis of assessment and plan:  74 year old male DNR/DNI with history of NCLC has diagnosed 04/25/2024, currently on chemo (carboplatin/paclitaxel weekly) and C XRT at [facility], COPD, recurrent pleural effusion emphysema, A-fib on apixaban , presents with SOB, patient was transferred here for Thora. CTA negative for PE   Acute hypoxic respiratory failure-improving on nasal cannula Recurrent right pleural effusion NSCLC-diagnosed 04/25/2024-on chemo (carboplatin/paclitaxel weekly), XRT at [facility] Hypotension post diuresis HFpEF last EF 40-45% A-fib on apixaban  - Hypothyroidism-on Synthroid    Plan - ICU monitoring and management - Thoracentesis, will also send sample, if malignant will benefit from PleurX - Breathing treatment-wean off levo as tolerated to maintain MAP> 65 - Continue pulse ox and cardiac monitor - Heparin  GTT for the procedure - H&H - Monitor and replace electrolyte as needed     CRITICAL CARE  Lenny Drought, MD  [place] Prefer epic messenger for cross cover needs   My critical care time: 30 minutes  Critical care time was exclusive of separately billable procedures and treating other patients.  Critical care was necessary to treat or prevent imminent or life-threatening deterioration.  Critical care was time spent personally by me on the following activities: development of treatment plan with patient and/or surrogate as well as nursing, discussions with consultants, evaluation of patient's response to treatment, examination of patient, obtaining history from patient or surrogate, ordering and performing treatments and interventions, ordering and review of laboratory studies, ordering and review of radiographic studies, pulse oximetry, re-evaluation of  patient's condition and participation in multidisciplinary rounds.

## 2024-07-06 NOTE — Progress Notes (Signed)
 PHARMACY - ANTICOAGULATION CONSULT NOTE  Pharmacy Consult:  Heparin  Indication: atrial fibrillation  Allergies[1]  Patient Measurements: Height: 5' 10 (177.8 cm) Weight: 55.3 kg (121 lb 14.6 oz) IBW/kg (Calculated) : 73 HEPARIN  DW (KG): 55.3  Vital Signs: Temp: 97.8 F (36.6 C) (01/14 1950) Temp Source: Oral (01/14 1950) BP: 98/56 (01/14 2100) Pulse Rate: 102 (01/14 2100)  Labs: Recent Labs    07/06/24 1909 07/06/24 2109  HGB 9.9*  --   HCT 30.1*  --   PLT 153  --   APTT  --  156*  HEPARINUNFRC  --  >1.10*  CREATININE 1.04  --     Estimated Creatinine Clearance: 49.5 mL/min (by C-G formula based on SCr of 1.04 mg/dL).   Medical History: Past Medical History:  Diagnosis Date   Arthritis    Carotid artery stenosis without cerebral infarction    vascular --- dr eliza;   09-24-2007  s/p right CEA   Chronic cholecystitis    w/  large calcified stone in gallbladder   Chronic constipation    COPD (chronic obstructive pulmonary disease) (HCC)    followed by pcp----  (05-28-2021  pt stated last used rescue 05-27-2021 for wheezing resolved after use, stated exacerbation approx  greater than a year ago)   Coronary artery disease 2002   cardiologist-- dr lavona ;  (05-28-2021  pt denies need to use nitro for CP) per last cath 08-04-2017  LM luminal irregularities, LAD 30% ,D1 ostial 35% ,CFx   AV groove ostial 25% , OM1 long prox 30%,  proxRCA occluded and dominant,   excellent left to right collateral filling.   Depression    Patient denies   DOE (dyspnea on exertion)    due to smoking/ copd   Dyslipidemia    Essential hypertension    Full dentures    GERD (gastroesophageal reflux disease)    History of adenomatous polyp of colon    History of COVID-19    per pt winter 2022, mild to moderate symptoms that resolved   History of diverticulitis of colon    History of kidney stones    Hyperlipidemia    Hypothyroidism    followed by pcp   Lung nodule    s/p  bronchoscopy w/ bx in 2005,  benign   Osteoporosis    Smokers' cough (HCC)    05-28-2021  pt stated occasionally productive w/ white phlegm   Wears glasses       Assessment: 73 YOM with lung cancer transferred from [facility] to [facility] for thoracentesis.  Patient has a history of Afib on Eliquis  PTA.  Last Eliquis  dose documented on 1/13  AM per [facility] report upon transfer.  Pharmacy consulted to manage IV heparin .    Labs from OSH:  SCr 1.07, hgb 8.8, plts 139 (improving)  aPTT 156,  supratherapeutic HL >1.1 supratherapeutic, likely falsely elevated in setting of recent DOAC   Goal of Therapy:  Heparin  level 0.3-0.7 units/ml aPTT 66-102 seconds Monitor platelets by anticoagulation protocol: Yes   Plan:  STOP heparin  infusion x1 hour Then restart at reduced rate of 650units/hr Check 8 hr aPTT and heparin  level Daily heparin  level , aPTT , and CBC   Sharyne Glatter, PharmD, BCCCP Critical Care Clinical Pharmacist 07/06/2024 10:16 PM    [1]  Allergies Allergen Reactions   Flomax  [Tamsulosin  Hcl] Other (See Comments)    Made patient hypotensive, clammy, sweaty, and feel like he was going to pass out (EMS had to be called)  Crestor [Rosuvastatin Calcium ] Other (See Comments)    Lethargy and muscle aches   Lipitor [Atorvastatin Calcium ] Other (See Comments)    Body cramps, lethargy, and muscle aches

## 2024-07-06 NOTE — Progress Notes (Addendum)
 0940 patient arrived alert x4 on 2L [place] able to make all needs known on of levophed    0947 decreased to of levophed   1010 decreased levo to 12mcg  1145 orders placed changed levo over to hospitals and hung other IV medications as ordered.   1146 procedure for right thoracentesis started patient alert x4 able to make all needs known on baseline 2L   1215 patient tolerate right thora correct well fluid walked to lab for specimens to be processed. Grand daughter at bedside updated on plan of care.  1330 Heparin  started per verbal orders 1 hour post thora completed

## 2024-07-07 ENCOUNTER — Encounter (HOSPITAL_COMMUNITY): Payer: Self-pay | Admitting: Pulmonary Disease

## 2024-07-07 DIAGNOSIS — A419 Sepsis, unspecified organism: Secondary | ICD-10-CM | POA: Diagnosis not present

## 2024-07-07 DIAGNOSIS — Z87891 Personal history of nicotine dependence: Secondary | ICD-10-CM | POA: Diagnosis not present

## 2024-07-07 DIAGNOSIS — E039 Hypothyroidism, unspecified: Secondary | ICD-10-CM

## 2024-07-07 DIAGNOSIS — R6521 Severe sepsis with septic shock: Secondary | ICD-10-CM | POA: Diagnosis not present

## 2024-07-07 DIAGNOSIS — I110 Hypertensive heart disease with heart failure: Secondary | ICD-10-CM

## 2024-07-07 DIAGNOSIS — C342 Malignant neoplasm of middle lobe, bronchus or lung: Secondary | ICD-10-CM | POA: Diagnosis not present

## 2024-07-07 DIAGNOSIS — I5030 Unspecified diastolic (congestive) heart failure: Secondary | ICD-10-CM | POA: Diagnosis not present

## 2024-07-07 DIAGNOSIS — J910 Malignant pleural effusion: Secondary | ICD-10-CM

## 2024-07-07 DIAGNOSIS — J9601 Acute respiratory failure with hypoxia: Secondary | ICD-10-CM | POA: Diagnosis not present

## 2024-07-07 DIAGNOSIS — I4891 Unspecified atrial fibrillation: Secondary | ICD-10-CM | POA: Diagnosis not present

## 2024-07-07 LAB — CYTOLOGY - NON PAP

## 2024-07-07 LAB — CBC
HCT: 30.9 % — ABNORMAL LOW (ref 39.0–52.0)
Hemoglobin: 10.1 g/dL — ABNORMAL LOW (ref 13.0–17.0)
MCH: 28.5 pg (ref 26.0–34.0)
MCHC: 32.7 g/dL (ref 30.0–36.0)
MCV: 87 fL (ref 80.0–100.0)
Platelets: 173 K/uL (ref 150–400)
RBC: 3.55 MIL/uL — ABNORMAL LOW (ref 4.22–5.81)
RDW: 14.7 % (ref 11.5–15.5)
WBC: 3.3 K/uL — ABNORMAL LOW (ref 4.0–10.5)
nRBC: 0 % (ref 0.0–0.2)

## 2024-07-07 LAB — URINALYSIS, ROUTINE W REFLEX MICROSCOPIC
Bilirubin Urine: NEGATIVE
Glucose, UA: NEGATIVE mg/dL
Hgb urine dipstick: NEGATIVE
Ketones, ur: NEGATIVE mg/dL
Leukocytes,Ua: NEGATIVE
Nitrite: NEGATIVE
Protein, ur: NEGATIVE mg/dL
Specific Gravity, Urine: 1.005 (ref 1.005–1.030)
pH: 7 (ref 5.0–8.0)

## 2024-07-07 LAB — BASIC METABOLIC PANEL WITH GFR
Anion gap: 7 (ref 5–15)
BUN: 16 mg/dL (ref 8–23)
CO2: 29 mmol/L (ref 22–32)
Calcium: 7.7 mg/dL — ABNORMAL LOW (ref 8.9–10.3)
Chloride: 98 mmol/L (ref 98–111)
Creatinine, Ser: 0.99 mg/dL (ref 0.61–1.24)
GFR, Estimated: >60 mL/min
Glucose, Bld: 143 mg/dL — ABNORMAL HIGH (ref 70–99)
Potassium: 4.3 mmol/L (ref 3.5–5.1)
Sodium: 134 mmol/L — ABNORMAL LOW (ref 135–145)

## 2024-07-07 LAB — LD, BODY FLUID (OTHER): LD, Body Fluid: 94 IU/L

## 2024-07-07 LAB — PHOSPHORUS: Phosphorus: 2.1 mg/dL — ABNORMAL LOW (ref 2.5–4.6)

## 2024-07-07 LAB — MAGNESIUM: Magnesium: 2.6 mg/dL — ABNORMAL HIGH (ref 1.7–2.4)

## 2024-07-07 LAB — PROTEIN, BODY FLUID (OTHER): Total Protein, Body Fluid Other: 2.1 g/dL

## 2024-07-07 LAB — APTT: aPTT: 100 s — ABNORMAL HIGH (ref 24–36)

## 2024-07-07 LAB — HEPARIN LEVEL (UNFRACTIONATED): Heparin Unfractionated: >1.1 [IU]/mL — ABNORMAL HIGH (ref 0.30–0.70)

## 2024-07-07 MED ORDER — PIPERACILLIN-TAZOBACTAM 3.375 G IVPB
3.3750 g | Freq: Three times a day (TID) | INTRAVENOUS | Status: AC
  Administered 2024-07-07 – 2024-07-13 (×20): 3.375 g via INTRAVENOUS
  Filled 2024-07-07 (×21): qty 50

## 2024-07-07 MED ORDER — ALBUMIN HUMAN 5 % IV SOLN
25.0000 g | Freq: Once | INTRAVENOUS | Status: DC
  Filled 2024-07-07: qty 500

## 2024-07-07 MED ORDER — ORAL CARE MOUTH RINSE: 15.0000 mL | OROMUCOSAL | Status: DC | PRN

## 2024-07-07 MED ORDER — SODIUM PHOSPHATES 45 MMOLE/15ML IV SOLN
15.0000 mmol | Freq: Once | INTRAVENOUS | Status: AC
  Administered 2024-07-07: 15 mmol via INTRAVENOUS
  Filled 2024-07-07: qty 5

## 2024-07-07 NOTE — Progress Notes (Signed)
 "  NAME:  [name] [name], MRN:  [id number], DOB:  [birth year], LOS: 1 ADMISSION DATE:  07/06/2024, CONSULTATION DATE:  07/06/24 REFERRING MD:  UNK SAUNDERS CHIEF COMPLAINT:  Dyspnea   History of Present Illness:  [name] [name] is a 74 y.o. male who has a PMH as below including but not limited to right NSCLC first diagnosed 04/25/24 after bx via bronchoscopy and currently undergoing chemo (Carboplatin/Paclitaxel weekly) and XRT under the care of Dr. Letty at [facility] (started 06/06/24), Emphysema, R effusion unknown if had thoracentesis 06/27/24 (no studies sent), A.fib on Apixaban .  He was admitted to [facility] R 07/04/24 with dyspnea. CTA was negative for PE but he  was found to have a right effusion and atlectasis. A right thora was planned but was aborted due to increased WOB and diaphragmatic excursion into the field of view causing limited safe window per notes in care everywhere. It was recommended that he be transferred to a tertiary center for further evaluation.  On 1/14, he was transferred to [facility]. He has been on NE for BP support after receiving Lasix in the ED at [facility] R with resultant hypotension.  Pertinent  Medical History:  has HLD (hyperlipidemia); TOBACCO ABUSE; Coronary atherosclerosis; Occlusion and stenosis of carotid artery without mention of cerebral infarction; ORGANIC IMPOTENCE; Hypothyroidism; Chronic idiopathic constipation; Coronary artery disease involving native coronary artery of native heart with angina pectoris; Essential hypertension; GERD (gastroesophageal reflux disease); COPD (chronic obstructive pulmonary disease) (HCC); Chronic calculous cholecystitis; Bilateral carotid artery stenosis; Myalgia due to statin; Anxiety disorder with panic attacks; Hilar mass; Mediastinal adenopathy; Atrial fibrillation, new onset (HCC); Malignant neoplasm of middle lobe of right lung (HCC); and Shock (HCC) on their problem list.  Significant Hospital Events: Including procedures, antibiotic  start and stop dates in addition to other pertinent events   1/12 admit to [facility] R 1/14 transfer to [facility]  Interim History / Subjective:  Sitting on a chair, conversational, doing well, sats well on nasal cannula.  Still hypotensive on 10 of levo  Objective:  Blood pressure 110/60, pulse 100, temperature (!) 97.5 F (36.4 C), temperature source Oral, resp. rate 20, height 5' 10 (1.778 m), weight 55.3 kg, SpO2 100%.        Intake/Output Summary (Last 24 hours) at 07/07/2024 0752 Last data filed at 07/07/2024 0600 Gross per 24 hour  Intake 3589.43 ml  Output 2000 ml  Net 1589.43 ml   Filed Weights   07/06/24 0941  Weight: 55.3 kg     Physical Exam: General: Sitting on a chair, not in distress HEENT: NCAT, Cardiovascular: Regular rate and rhythm, no murmur Lungs: Symmetrical chest wall movement, clear to auscultation Abdomen: Soft nontender nondistended Neuro: AAO X3, conversational, moving all limbs, no focal deficits GU: Deferred     Labs/imaging personally reviewed:  CTA chest 1/13 (from [facility]) > no PE, large R pleural effusion with atx, right hillar mass with RML collapse.  Assessment & Plan:   74 year old male DNR/DNI with history of NSCLC diagnosed 04/25/2024, currently on chemo (carboplatin/paclitaxel weekly) and XRT at [facility], COPD, recurrent pleural effusion, emphysema, A-fib on apixaban , presents with SOB.  Patient was transferred to [facility] for Thora. CTA negative for PE Patient is s/p R-Thora on 1/14-1 L,   Acute hypoxic respiratory failure-improving on nasal cannula Recurrent right pleural effusion NSLNC-diagnosed 04/25/2024-on chemo (carboplatin/paclitaxel weekly), XRT at [facility] health Septic shock HFpEF last EF 40-45% A-fib on home apixaban  Hypothyroidism on Synthroid    Plan - Continue ICU monitoring and management -  Still on levo 10, will wean off as tolerated to maintain MAP> 65 - Will give some fluids as well. - Will start empiric antibiotics for septic shock  and pneumonia-Zosyn  - Continue pulse ox and cardiac monitoring - Continue breathing treatment - Patient is s/p right Thora on 1/4-1 L out, will follow sample.,  If malignant will benefit from PleurX - Continue heparin  GTT - PT OT - Monitor and replace lites as needed   Labs   CBC: Recent Labs  Lab 07/06/24 1909 07/07/24 0725  WBC 3.6* 3.3*  HGB 9.9* 10.1*  HCT 30.1* 30.9*  MCV 88.0 87.0  PLT 153 173    Basic Metabolic Panel: Recent Labs  Lab 07/06/24 1909  NA 135  K 4.0  CL 98  CO2 29  GLUCOSE 133*  BUN 22  CREATININE 1.04  CALCIUM  8.0*  MG 1.3*   GFR: Estimated Creatinine Clearance: 49.5 mL/min (by C-G formula based on SCr of 1.04 mg/dL). Recent Labs  Lab 07/06/24 1909 07/07/24 0725  WBC 3.6* 3.3*    Liver Function Tests: Recent Labs  Lab 07/06/24 1147  PROT 6.4*   No results for input(s): LIPASE, AMYLASE in the last 168 hours. No results for input(s): AMMONIA in the last 168 hours.  ABG    Component Value Date/Time   HCO3 27.3 01/29/2024 1850   TCO2 27 05/30/2021 0810   O2SAT 63.7 01/29/2024 1850     Coagulation Profile: No results for input(s): INR, PROTIME in the last 168 hours.  Cardiac Enzymes: No results for input(s): CKTOTAL, CKMB, CKMBINDEX, TROPONINI in the last 168 hours.  HbA1C: HB A1C (BAYER DCA - WAIVED)  Date/Time Value Ref Range Status  01/07/2016 09:00 AM 5.9 <7.0 % Final    Comment:                                          Diabetic Adult            <7.0                                       Healthy Adult        4.3 - 5.7                                                           (DCCT/NGSP) American Diabetes Association's Summary of Glycemic Recommendations for Adults with Diabetes: Hemoglobin A1c <7.0%. More stringent glycemic goals (A1c <6.0%) may further reduce complications at the cost of increased risk of hypoglycemia.    Hgb A1c MFr Bld  Date/Time Value Ref Range Status  10/02/2023 08:39 AM  6.1 (H) 4.8 - 5.6 % Final    Comment:             Prediabetes: 5.7 - 6.4          Diabetes: >6.4          Glycemic control for adults with diabetes: <7.0   07/13/2017 09:30 AM 5.9 (H) 4.8 - 5.6 % Final    Comment:             Prediabetes: 5.7 -  6.4          Diabetes: >6.4          Glycemic control for adults with diabetes: <7.0     CBG: Recent Labs  Lab 07/06/24 0942  GLUCAP 115*    Review of Systems:   All negative; except for those that are bolded, which indicate positives.  Constitutional: weight loss, weight gain, night sweats, fevers, chills, fatigue, weakness.  HEENT: headaches, sore throat, sneezing, nasal congestion, post nasal drip, difficulty swallowing, tooth/dental problems, visual complaints, visual changes, ear aches. Neuro: difficulty with speech, weakness, numbness, ataxia. CV:  chest pain, orthopnea, PND, swelling in lower extremities, dizziness, palpitations, syncope.  Resp: cough, hemoptysis, dyspnea, wheezing. GI: heartburn, indigestion, abdominal pain, nausea, vomiting, diarrhea, constipation, change in bowel habits, loss of appetite, hematemesis, melena, hematochezia.  GU: dysuria, change in color of urine, urgency or frequency, flank pain, hematuria. MSK: joint pain or swelling, decreased range of motion. Psych: change in mood or affect, depression, anxiety, suicidal ideations, homicidal ideations. Skin: rash, itching, bruising.   Past Medical History:  He,  has a past medical history of Arthritis, Carotid artery stenosis without cerebral infarction, Chronic cholecystitis, Chronic constipation, COPD (chronic obstructive pulmonary disease) (HCC), Coronary artery disease (2002), Depression, DOE (dyspnea on exertion), Dyslipidemia, Essential hypertension, Full dentures, GERD (gastroesophageal reflux disease), History of adenomatous polyp of colon, History of COVID-19, History of diverticulitis of colon, History of kidney stones, Hyperlipidemia, Hypothyroidism,  Lung nodule, Osteoporosis, Smokers' cough (HCC), and Wears glasses.   Surgical History:   Past Surgical History:  Procedure Laterality Date   BRONCHIAL BIOPSY  04/25/2024   Procedure: BRONCHOSCOPY, WITH BIOPSY;  Surgeon: Shelah Lamar RAMAN, MD;  Location: [facility] ENDOSCOPY;  Service: Pulmonary;;   BRONCHIAL BRUSHINGS  04/25/2024   Procedure: BRONCHOSCOPY, WITH BRUSH BIOPSY;  Surgeon: Shelah Lamar RAMAN, MD;  Location: [facility] ENDOSCOPY;  Service: Pulmonary;;   BRONCHIAL NEEDLE ASPIRATION BIOPSY  04/25/2024   Procedure: BRONCHOSCOPY, WITH NEEDLE ASPIRATION BIOPSY;  Surgeon: Shelah Lamar RAMAN, MD;  Location: [facility] ENDOSCOPY;  Service: Pulmonary;;   BRONCHOSCOPY  2005   CARDIAC CATHETERIZATION  08/11/2000   @[facility]  by dr morris;   preserved LVF,  ostial RCA 70-75%, other nonobstructive disease   CARDIAC CATHETERIZATION  03/30/2008   @[facility]  by dr hochrein;  occluded RCA and other nonobstructive involving LAD, CFx, D1,OM1  and left to right collateral filling,  mild inferior hypokinesis,  ef 55%   CAROTID ENDARTERECTOMY Right 09/24/2007   @[facility]  by dr eliza   COLONOSCOPY  01/19/2017   by armsbruster   EXCISION NEUROMA Left 10/01/2005   @[facility] ;  left inguinal   EXTRACORPOREAL SHOCK WAVE LITHOTRIPSY  2015   INGUINAL HERNIA REPAIR Left    01-13-2003 and 07-22-2006 both @[facility]    LAPAROSCOPIC CHOLECYSTECTOMY SINGLE SITE WITH INTRAOPERATIVE CHOLANGIOGRAM N/A 05/30/2021   Procedure: LAPAROSCOPIC CHOLECYSTECTOMY SINGLE SITE WITH INTRAOPERATIVE CHOLANGIOGRAM;  Surgeon: Sheldon Standing, MD;  Location: [facility];  Service: General;  Laterality: N/A;   LAPAROSCOPIC INGUINAL HERNIA REPAIR Bilateral 04/28/2007   @[facility]    LEFT HEART CATH AND CORONARY ANGIOGRAPHY N/A 08/04/2017   Procedure: LEFT HEART CATH AND CORONARY ANGIOGRAPHY;  Surgeon: Verlin Lonni BIRCH, MD;  Location: [facility] INVASIVE CV LAB;  Service: Cardiovascular;  Laterality: N/A;   THROAT SURGERY  1997   pt stated removal of tumor that was benign   VIDEO  BRONCHOSCOPY WITH ENDOBRONCHIAL ULTRASOUND Right 04/25/2024   Procedure: BRONCHOSCOPY, WITH EBUS;  Surgeon: Shelah Lamar RAMAN, MD;  Location: [facility] ENDOSCOPY;  Service: Pulmonary;  Laterality: Right;  Right hilar mass and suspected  lymph node involvement     Social History:   reports that he quit smoking about 5 months ago. His smoking use included cigarettes. He started smoking about a year ago. He has a 58.9 pack-year smoking history. He has never used smokeless tobacco. He reports that he does not currently use alcohol. He reports that he does not use drugs.   Family History:  His family history includes Alcohol abuse in his brother; Colon cancer (age of onset: 16) in his brother; Colon polyps in his brother; Healthy in his daughter, son, son, and son; Lung disease in his brother, brother, and father; Lupus in his brother and mother. There is no history of Esophageal cancer, Rectal cancer, Stomach cancer, Pancreatic cancer, Crohn's disease, or Ulcerative colitis.   Allergies Allergies[1]   Home Medications  Prior to Admission medications  Medication Sig Start Date End Date Taking? Authorizing Provider  albuterol  (PROVENTIL ) (2.5 MG/3ML) 0.083% nebulizer solution INHALE ONE vial by NEBULIZER EVERY 6 HOURS AS NEEDED FOR WHEEZING OR SHORTNESS OF BREATH 07/01/24   Dettinger, [name], MD  amiodarone  (PACERONE ) 200 MG tablet Take 400 mg by mouth 2 (two) times daily. Take 2 tablets (400 mg total) by mouth 2 (two) times a day with meals for 5 days, THEN 1 tablet (200 mg total) 2 (two) times a day with meals for 7 days, THEN 1 tablet (200 mg total) daily.    [provider]  apixaban  (ELIQUIS ) 2.5 MG TABS tablet Take 1 tablet (2.5 mg total) by mouth 2 (two) times daily. 06/03/24   Lavona Agent, MD  Bempedoic Acid -Ezetimibe  (NEXLIZET ) 180-10 MG TABS TAKE ONE TABLET DAILY 12/22/23   Dettinger, [name], MD  Bisoprolol  Fumarate 2.5 MG TABS Take 2.5 mg by mouth daily. 05/30/24   Miriam Norris, NP   budesonide -glycopyrrolate -formoterol  (BREZTRI  AEROSPHERE) 160-9-4.8 MCG/ACT AERO inhaler Inhale 2 puffs into the lungs in the morning and at bedtime. Patient not taking: Reported on 06/29/2024 01/31/24   Pearlean Manus, MD  fluticasone  (FLONASE ) 50 MCG/ACT nasal spray Place 2 sprays into both nostrils daily. 08/10/23   [facility] [place] Sebastian Pool, NP  hydrOXYzine  (VISTARIL ) 25 MG capsule TAKE ONE CAPSULE EVERY 8 HOURS AS NEEDED 05/12/24   Dettinger, [name], MD  levothyroxine  (SYNTHROID ) 50 MCG tablet Take 1 tablet (50 mcg total) by mouth daily before breakfast. 01/31/24   Pearlean, Courage, MD  nitroGLYCERIN  (NITROSTAT ) 0.4 MG SL tablet Place 1 tablet (0.4 mg total) under the tongue every 5 (five) minutes as needed for chest pain. 02/26/24 05/30/97  Miriam Norris, NP  omeprazole  (PRILOSEC) 20 MG capsule Take 1 capsule (20 mg total) by mouth daily. 01/31/24   Pearlean Manus, MD  ondansetron  (ZOFRAN ) 8 MG tablet Take 8 mg by mouth every 8 (eight) hours as needed. 06/06/24   [provider]  OXYGEN  Inhale 2 L into the lungs daily.    [provider]  prochlorperazine (COMPAZINE) 10 MG tablet Take 10 mg by mouth every 8 (eight) hours as needed. 06/06/24   [provider]  traZODone  (DESYREL ) 100 MG tablet Take 1 tablet (100 mg total) by mouth at bedtime. 02/11/24   Dettinger, [name], MD  valsartan  (DIOVAN ) 80 MG tablet Take 80 mg by mouth daily. 05/13/24   [provider]         CRITICAL CARE  Lenny Drought, MD  [facility] Prefer epic messenger for cross cover needs   My critical care  time: 30 minutes  Critical care time was exclusive of separately billable procedures and treating other patients.  Critical care was necessary to treat or prevent imminent or life-threatening deterioration.  Critical care was time spent personally by me on the following activities: development of treatment plan with patient and/or surrogate as well  as nursing, discussions with consultants, evaluation of patient's response to treatment, examination of patient, obtaining history from patient or surrogate, ordering and performing treatments and interventions, ordering and review of laboratory studies, ordering and review of radiographic studies, pulse oximetry, re-evaluation of patient's condition and participation in multidisciplinary rounds.     [1]  Allergies Allergen Reactions   Flomax  [Tamsulosin  Hcl] Other (See Comments)    Made patient hypotensive, clammy, sweaty, and feel like he was going to pass out (EMS had to be called)   Crestor [Rosuvastatin Calcium ] Other (See Comments)    Lethargy and muscle aches   Lipitor [Atorvastatin Calcium ] Other (See Comments)    Body cramps, lethargy, and muscle aches   "

## 2024-07-07 NOTE — Progress Notes (Signed)
 PHARMACY - ANTICOAGULATION CONSULT NOTE  Pharmacy Consult:  Heparin  Indication: atrial fibrillation  Allergies[1]  Patient Measurements: Height: 5' 10 (177.8 cm) Weight: 55.3 kg (121 lb 14.6 oz) IBW/kg (Calculated) : 73 HEPARIN  DW (KG): 55.3  Vital Signs: Temp: 97.4 F (36.3 C) (01/15 0811) Temp Source: Oral (01/15 0811) BP: 93/49 (01/15 0845) Pulse Rate: 101 (01/15 0845)  Labs: Recent Labs    07/06/24 1909 07/06/24 2109 07/07/24 0725 07/07/24 0732  HGB 9.9*  --  10.1*  --   HCT 30.1*  --  30.9*  --   PLT 153  --  173  --   APTT  --  156*  --  100*  HEPARINUNFRC  --  >1.10* >1.10*  --   CREATININE 1.04  --  0.99  --     Estimated Creatinine Clearance: 52 mL/min (by C-G formula based on SCr of 0.99 mg/dL).   Assessment: 62 YOM with lung cancer transferred from [facility] to [facility] for thoracentesis.  Patient has a history of Afib on Eliquis  PTA.  Last Eliquis  dose documented on 1/13  AM per [facility] report upon transfer.  Pharmacy consulted to manage IV heparin .    aPTT down to therapeutic range and high normal.  Heparin  level falsely elevated due to recent DOAC use.  CBC stable; no bleeding reported.  Goal of Therapy:  Heparin  level 0.3-0.7 units/ml aPTT 66-102 seconds Monitor platelets by anticoagulation protocol: Yes   Plan:  Reduce IV heparin  slightly to 600 units/hr Daily heparin  level, aPTT, and CBC   Dushaun Okey D. Lendell, PharmD, BCPS, BCCCP 07/07/2024, 9:55 AM     [1]  Allergies Allergen Reactions   Flomax  [Tamsulosin  Hcl] Other (See Comments)    Made patient hypotensive, clammy, sweaty, and feel like he was going to pass out (EMS had to be called)   Crestor [Rosuvastatin Calcium ] Other (See Comments)    Lethargy and muscle aches   Lipitor [Atorvastatin Calcium ] Other (See Comments)    Body cramps, lethargy, and muscle aches

## 2024-07-07 NOTE — TOC CM/SW Note (Signed)
 Transition of Care [facility]) - Inpatient Brief Assessment   Patient Details  Name: [name] [name] MRN: [id number] Date of Birth: 1950/07/30  Transition of Care [facility]) CM/SW Contact:    [name] [name], [name] Muskrat, RN Phone Number: 07/07/2024, 2:38 PM   Clinical Narrative:  Patient transferred from [facility] to [facility] for further evaluation of Rt Effusion and Atelectasis. Patient underwent Rt Thoracentesis yesterday 07/06/24 with no post-procedural complication noted. Patient has hx of Rt NSCLC currently undergoing Chemo,   Emphysema, A-Fib on Eliquis . Currently on Heparin  gtt, IV abx, Levo. On 2L O2 acute, does not use home O2.   CM spoke with patient, wife [name] [name] and grand daughter [name] [name] at bedside about needs for post hospital transition. Patient lives with his wife, [name] [name], has five supportive children. Retired, was able to drive self until recent hospitalization. Has a cane, walker and BSC at home.  PCP is [name] [name], [name] LABOR, MD and uses [employer].  [name] [name] states patient was referred to [facility] in [place] for Palliative services. States a home visit was scheduled but patient had to come to the hospital. CM called [facility] and spoke with [name] [name]. [name] [name] confirmed a date was scheduled for a visit but patient came to the hospital. States they will start care at discharge. [name] [name] requested home health PT to assist with Weakness at home. Awaiting PT/OT eval. Patient requested to add his daughter, [name] [name] name to his contact on Epic, updated.   Patient not Medically ready for discharge.  CM will continue to follow as patient progresses with care towards discharge.        Transition of Care Asessment: Insurance and Status: Insurance coverage has been reviewed Patient has primary care physician: Yes Home environment has been reviewed: Yes Prior level of function:: Modified Independent Prior/Current Home Services: Current home services (active  with [facility] in [place].) Social Drivers of Health Review: SDOH reviewed no interventions necessary Readmission risk has been reviewed: Yes Transition of care needs: transition of care needs identified, TOC will continue to follow

## 2024-07-08 ENCOUNTER — Inpatient Hospital Stay (HOSPITAL_COMMUNITY)

## 2024-07-08 DIAGNOSIS — J90 Pleural effusion, not elsewhere classified: Secondary | ICD-10-CM | POA: Diagnosis not present

## 2024-07-08 DIAGNOSIS — J449 Chronic obstructive pulmonary disease, unspecified: Secondary | ICD-10-CM

## 2024-07-08 DIAGNOSIS — R578 Other shock: Secondary | ICD-10-CM

## 2024-07-08 DIAGNOSIS — I4891 Unspecified atrial fibrillation: Secondary | ICD-10-CM | POA: Diagnosis not present

## 2024-07-08 DIAGNOSIS — R579 Shock, unspecified: Secondary | ICD-10-CM | POA: Diagnosis not present

## 2024-07-08 DIAGNOSIS — J9611 Chronic respiratory failure with hypoxia: Secondary | ICD-10-CM | POA: Diagnosis not present

## 2024-07-08 DIAGNOSIS — J189 Pneumonia, unspecified organism: Secondary | ICD-10-CM | POA: Diagnosis not present

## 2024-07-08 LAB — ECHOCARDIOGRAM COMPLETE
AR max vel: 2.53 cm2
AV Area VTI: 2.38 cm2
AV Area mean vel: 3.12 cm2
AV Mean grad: 2 mmHg
AV Peak grad: 4.2 mmHg
Ao pk vel: 1.02 m/s
Area-P 1/2: 5.02 cm2
Calc EF: 34.9 %
Est EF: 25
Height: 70 in
Single Plane A2C EF: 38.1 %
Single Plane A4C EF: 35.1 %
Weight: 1950.63 [oz_av]

## 2024-07-08 LAB — BASIC METABOLIC PANEL WITH GFR
Anion gap: 9 (ref 5–15)
BUN: 13 mg/dL (ref 8–23)
CO2: 25 mmol/L (ref 22–32)
Calcium: 7.6 mg/dL — ABNORMAL LOW (ref 8.9–10.3)
Chloride: 100 mmol/L (ref 98–111)
Creatinine, Ser: 0.94 mg/dL (ref 0.61–1.24)
GFR, Estimated: >60 mL/min
Glucose, Bld: 138 mg/dL — ABNORMAL HIGH (ref 70–99)
Potassium: 4 mmol/L (ref 3.5–5.1)
Sodium: 134 mmol/L — ABNORMAL LOW (ref 135–145)

## 2024-07-08 LAB — COOXEMETRY PANEL
Carboxyhemoglobin: 2.1 % — ABNORMAL HIGH (ref 0.5–1.5)
Methemoglobin: <0.7 % (ref 0.0–1.5)
O2 Saturation: 71.7 %
Total hemoglobin: 8.6 g/dL — ABNORMAL LOW (ref 12.0–16.0)

## 2024-07-08 LAB — CBC
HCT: 27.2 % — ABNORMAL LOW (ref 39.0–52.0)
Hemoglobin: 9.2 g/dL — ABNORMAL LOW (ref 13.0–17.0)
MCH: 28.8 pg (ref 26.0–34.0)
MCHC: 33.8 g/dL (ref 30.0–36.0)
MCV: 85 fL (ref 80.0–100.0)
Platelets: 190 K/uL (ref 150–400)
RBC: 3.2 MIL/uL — ABNORMAL LOW (ref 4.22–5.81)
RDW: 14.8 % (ref 11.5–15.5)
WBC: 3.7 K/uL — ABNORMAL LOW (ref 4.0–10.5)
nRBC: 0 % (ref 0.0–0.2)

## 2024-07-08 LAB — APTT: aPTT: 97 s — ABNORMAL HIGH (ref 24–36)

## 2024-07-08 LAB — PHOSPHORUS: Phosphorus: 2.4 mg/dL — ABNORMAL LOW (ref 2.5–4.6)

## 2024-07-08 LAB — HEPARIN LEVEL (UNFRACTIONATED): Heparin Unfractionated: 0.75 [IU]/mL — ABNORMAL HIGH (ref 0.30–0.70)

## 2024-07-08 LAB — MAGNESIUM: Magnesium: 2.2 mg/dL (ref 1.7–2.4)

## 2024-07-08 MED ORDER — ALPRAZOLAM 0.25 MG PO TABS
0.2500 mg | ORAL_TABLET | Freq: Two times a day (BID) | ORAL | Status: DC | PRN
  Administered 2024-07-08 – 2024-07-14 (×8): 0.25 mg via ORAL
  Filled 2024-07-08 (×8): qty 1

## 2024-07-08 MED ORDER — POLYETHYLENE GLYCOL 3350 17 G PO PACK
17.0000 g | PACK | Freq: Every day | ORAL | Status: DC
  Filled 2024-07-08 (×5): qty 1

## 2024-07-08 MED ORDER — AZITHROMYCIN 500 MG PO TABS
500.0000 mg | ORAL_TABLET | Freq: Every day | ORAL | Status: AC
  Administered 2024-07-08 – 2024-07-10 (×3): 500 mg via ORAL
  Filled 2024-07-08 (×3): qty 1

## 2024-07-08 MED ORDER — PANTOPRAZOLE SODIUM 40 MG IV SOLR
40.0000 mg | Freq: Every day | INTRAVENOUS | Status: DC
  Administered 2024-07-08 – 2024-07-13 (×6): 40 mg via INTRAVENOUS
  Filled 2024-07-08 (×6): qty 10

## 2024-07-08 MED ORDER — SENNOSIDES-DOCUSATE SODIUM 8.6-50 MG PO TABS
1.0000 | ORAL_TABLET | Freq: Two times a day (BID) | ORAL | Status: DC
  Administered 2024-07-09 – 2024-07-12 (×3): 1 via ORAL
  Filled 2024-07-08 (×9): qty 1

## 2024-07-08 MED ORDER — SODIUM PHOSPHATES 45 MMOLE/15ML IV SOLN
15.0000 mmol | Freq: Once | INTRAVENOUS | Status: AC
  Administered 2024-07-08: 15 mmol via INTRAVENOUS
  Filled 2024-07-08: qty 5

## 2024-07-08 MED ORDER — PERFLUTREN LIPID MICROSPHERE
1.0000 mL | INTRAVENOUS | Status: AC | PRN
  Administered 2024-07-08: 2 mL via INTRAVENOUS

## 2024-07-08 MED ORDER — IPRATROPIUM-ALBUTEROL 0.5-2.5 (3) MG/3ML IN SOLN
3.0000 mL | Freq: Four times a day (QID) | RESPIRATORY_TRACT | Status: DC | PRN
  Administered 2024-07-13: 3 mL via RESPIRATORY_TRACT
  Filled 2024-07-08: qty 3

## 2024-07-08 NOTE — Evaluation (Signed)
 Physical Therapy Evaluation Patient Details Name: [name] [name] MRN: [id number] DOB: 03-04-1951 Today's Date: 07/08/2024  History of Present Illness  74 y.o. male presents to [facility] hospital on 07/06/2024 as a transfer from [facility] where he was found to have R effusion and atelectasis. PMH includes R NSCLC undergoing chemo and radiation, emphysema, HLD, CAD, COPD.  Clinical Impression  Pt presents to PT with deficits in activity tolerance, cardiopulmonary function, power and strength. Pt is able to ambulate for household distances with support of a RW, reporting 7/10 DOE after ambulation. Pt does report the distance walked during eval is further than he has been able to walk recently at home. Pt will benefit from frequent mobilization in an effort to improve activity tolerance. PT recommends discharge home with HHPT when medically appropriate.        If plan is discharge home, recommend the following: A little help with walking and/or transfers;A little help with bathing/dressing/bathroom;Assistance with cooking/housework;Help with stairs or ramp for entrance;Assist for transportation   Can travel by private vehicle        Equipment Recommendations Other (comment) (may benefit from a transport chair, pt declining at this time)  Recommendations for Other Services       Functional Status Assessment Patient has had a recent decline in their functional status and demonstrates the ability to make significant improvements in function in a reasonable and predictable amount of time.     Precautions / Restrictions Precautions Precautions: Fall Recall of Precautions/Restrictions: Intact Precaution/Restrictions Comments: port Restrictions Weight Bearing Restrictions Per Provider Order: No      Mobility  Bed Mobility Overal bed mobility: Needs Assistance Bed Mobility: Supine to Sit     Supine to sit: Supervision          Transfers Overall transfer level: Needs  assistance Equipment used: Rolling walker (2 wheels) Transfers: Sit to/from Stand Sit to Stand: Supervision                Ambulation/Gait Ambulation/Gait assistance: Supervision Gait Distance (Feet): 120 Feet Assistive device: Rolling walker (2 wheels) Gait Pattern/deviations: Step-through pattern Gait velocity: functional Gait velocity interpretation: 1.31 - 2.62 ft/sec, indicative of limited community ambulator   General Gait Details: steady step-through gait  Stairs            Wheelchair Mobility     Tilt Bed    Modified Rankin (Stroke Patients Only)       Balance Overall balance assessment: Needs assistance Sitting-balance support: No upper extremity supported, Feet supported Sitting balance-Leahy Scale: Good     Standing balance support: Single extremity supported, Reliant on assistive device for balance Standing balance-Leahy Scale: Poor                               Pertinent Vitals/Pain Pain Assessment Pain Assessment: No/denies pain    Home Living Family/patient expects to be discharged to:: Private residence Living Arrangements: Spouse/significant other Available Help at Discharge: Family;Available 24 hours/day Type of Home: House Home Access: Level entry     Alternate Level Stairs-Number of Steps: 5-7 Home Layout: Multi-level Home Equipment: Rolling Walker (2 wheels);Cane - single point;BSC/3in1;Shower seat      Prior Function Prior Level of Function : Independent/Modified Independent             Mobility Comments: since recent hospitalization in December the pt has been ambulating for household distances with use of a RW and has been sleeping on  the main floor of the home in a recliner       Extremity/Trunk Assessment   Upper Extremity Assessment Upper Extremity Assessment: Overall WFL for tasks assessed    Lower Extremity Assessment Lower Extremity Assessment: Generalized weakness    Cervical / Trunk  Assessment Cervical / Trunk Assessment: Normal  Communication   Communication Communication: No apparent difficulties    Cognition Arousal: Alert Behavior During Therapy: WFL for tasks assessed/performed   PT - Cognitive impairments: No apparent impairments                         Following commands: Intact       Cueing Cueing Techniques: Verbal cues     General Comments General comments (skin integrity, edema, etc.): pt on 2L [place], SpO2 stable 93% and higher with activity. Respiratory rate does increase up to 40 with activity but recovers to 20s when seated and resting    Exercises     Assessment/Plan    PT Assessment Patient needs continued PT services  PT Problem List Decreased strength;Decreased activity tolerance;Decreased balance;Decreased mobility;Cardiopulmonary status limiting activity       PT Treatment Interventions DME instruction;Gait training;Stair training;Functional mobility training;Therapeutic activities;Therapeutic exercise;Balance training;Neuromuscular re-education;Patient/family education    PT Goals (Current goals can be found in the Care Plan section)  Acute Rehab PT Goals Patient Stated Goal: to improve activity tolerance PT Goal Formulation: With patient Time For Goal Achievement: 07/22/24 Potential to Achieve Goals: Fair Additional Goals Additional Goal #1: Pt will report 3/10 DOE or less when ambulating for >150' to demonstrate improved tolerance for household mobility    Frequency Min 2X/week     Co-evaluation               AM-PAC PT 6 Clicks Mobility  Outcome Measure Help needed turning from your back to your side while in a flat bed without using bedrails?: A Little Help needed moving from lying on your back to sitting on the side of a flat bed without using bedrails?: A Little Help needed moving to and from a bed to a chair (including a wheelchair)?: A Little Help needed standing up from a chair using your arms  (e.g., wheelchair or bedside chair)?: A Little Help needed to walk in hospital room?: A Little Help needed climbing 3-5 steps with a railing? : A Little 6 Click Score: 18    End of Session Equipment Utilized During Treatment: Gait belt;Oxygen  Activity Tolerance: Patient tolerated treatment well Patient left: in chair;with call bell/phone within reach;with nursing/sitter in room Nurse Communication: Mobility status PT Visit Diagnosis: Other abnormalities of gait and mobility (R26.89);Muscle weakness (generalized) (M62.81)    Time: 9156-9098 PT Time Calculation (min) (ACUTE ONLY): 18 min   Charges:   PT Evaluation $PT Eval Low Complexity: 1 Low   PT General Charges $$ ACUTE PT VISIT: 1 Visit         Bernardino JINNY Ruth, PT, DPT Acute Rehabilitation Office [phone]   Bernardino JINNY Ruth 07/08/2024, 9:50 AM

## 2024-07-08 NOTE — Progress Notes (Signed)
 PHARMACY - ANTICOAGULATION CONSULT NOTE  Pharmacy Consult:  Heparin  Indication: atrial fibrillation  Allergies[1]  Patient Measurements: Height: 5' 10 (177.8 cm) Weight: 55.3 kg (121 lb 14.6 oz) IBW/kg (Calculated) : 73 HEPARIN  DW (KG): 55.3  Vital Signs: Temp: 97.9 F (36.6 C) (01/16 0340) Temp Source: Oral (01/16 0340) BP: 115/64 (01/16 0615) Pulse Rate: 102 (01/16 0615)  Labs: Recent Labs    07/06/24 1909 07/06/24 2109 07/07/24 0725 07/07/24 0732 07/08/24 0519  HGB 9.9*  --  10.1*  --  9.2*  HCT 30.1*  --  30.9*  --  27.2*  PLT 153  --  173  --  190  APTT  --  156*  --  100* 97*  HEPARINUNFRC  --  >1.10* >1.10*  --  0.75*  CREATININE 1.04  --  0.99  --  0.94    Estimated Creatinine Clearance: 54.7 mL/min (by C-G formula based on SCr of 0.94 mg/dL).   Assessment: 85 YOM with lung cancer transferred from [facility] to [facility] for thoracentesis.  Patient has a history of Afib on Eliquis  PTA.  Last Eliquis  dose documented on 1/13  AM per [facility] report upon transfer.  Pharmacy consulted to manage IV heparin .    aPTT down to therapeutic range and high normal.  Heparin  level falsely elevated but trending down.  CBC stable; no bleeding reported.  Goal of Therapy:  Heparin  level 0.3-0.7 units/ml aPTT 66-102 seconds Monitor platelets by anticoagulation protocol: Yes   Plan:  Reduce IV heparin  slightly to 550 units/hr Daily heparin  level, aPTT, and CBC   Nashay Brickley D. Lendell, PharmD, BCPS, BCCCP 07/08/2024, 7:16 AM      [1]  Allergies Allergen Reactions   Flomax  [Tamsulosin  Hcl] Other (See Comments)    Made patient hypotensive, clammy, sweaty, and feel like he was going to pass out (EMS had to be called)   Crestor [Rosuvastatin Calcium ] Other (See Comments)    Lethargy and muscle aches   Lipitor [Atorvastatin Calcium ] Other (See Comments)    Body cramps, lethargy, and muscle aches

## 2024-07-08 NOTE — TOC Progression Note (Signed)
 Transition of Care [facility]) - Progression Note    Patient Details  Name: [name] [name] MRN: [id number] Date of Birth: March 11, 1951  [facility] CM/SW Contact  Tom-Johnson, Venicia Vandall Daphne, RN Phone Number: 07/08/2024, 1:39 PM  Clinical Narrative:     CM spoke with patient and grand daughter, [name] at bedside about home health recommendations. [name] states a referral was made to [facility] during his last admission but patient was not able to start care d/t patient returning to the hospital. Patient and [name] requested [facility] at this time. M sent referral via Hub and also spoke with Adriana [facility] Liaison with acceptance voiced, info on AVS.   Patient not Medically ready for discharge.  CM will continue to follow as patient progresses with care towards discharge.                Expected Discharge Plan and Services                                               Social Drivers of Health (SDOH) Interventions SDOH Screenings   Food Insecurity: No Food Insecurity (07/06/2024)  Housing: Low Risk (07/06/2024)  Transportation Needs: No Transportation Needs (07/06/2024)  Utilities: Not At Risk (07/06/2024)  Alcohol Screen: Low Risk (04/01/2023)  Depression (PHQ2-9): Low Risk (06/29/2024)  Financial Resource Strain: Low Risk (06/26/2024)   Received from [facility]  Physical Activity: Inactive (05/02/2024)   Received from [facility]  Social Connections: Socially Integrated (07/06/2024)  Stress: No Stress Concern Present (05/02/2024)   Received from [facility]  Tobacco Use: Medium Risk (07/07/2024)  Health Literacy: Low Risk (05/02/2024)   [facility] from [facility]    Readmission Risk Interventions    07/07/2024    2:35 PM 01/30/2024    3:58 PM  Readmission Risk Prevention Plan  Post Dischage Appt  Complete  Medication Screening  Complete  Transportation Screening Complete Complete  PCP or Specialist Appt within 5-7 Days Complete    Home Care Screening Complete   Medication Review (RN CM) Referral to Pharmacy

## 2024-07-08 NOTE — Progress Notes (Signed)
 "  NAME:  [name] [name], MRN:  [id number], DOB:  05/21/1951, LOS: 2 ADMISSION DATE:  07/06/2024, CONSULTATION DATE:  07/08/24 REFERRING MD:  MELODIE, CHIEF COMPLAINT:  SOB   History of Present Illness:  [name] [name] is a 74 y.o. male who has a PMH as below including but not limited to right NSCLC first diagnosed 04/25/24 after bx via bronchoscopy and currently undergoing chemo (Carboplatin/Paclitaxel weekly) and XRT under the care of Dr. Letty at [facility] (started 06/06/24), Emphysema, R effusion unknown if had thoracentesis 06/27/24 (no studies sent), A.fib on Apixaban .   He was admitted to [facility] 07/04/24 with dyspnea. CTA was negative for PE but he  was found to have a right effusion and atlectasis. A right thora was planned but was aborted due to increased WOB and diaphragmatic excursion into the field of view causing limited safe window per notes in care everywhere. It was recommended that he be transferred to a tertiary center for further evaluation.   On 1/14, he was transferred to [facility]. He has been on NE for BP support after receiving Lasix in the ED at [facility] with resultant hypotension.  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   Significant Hospital Events: Including procedures, antibiotic start  and stop dates in addition to other pertinent events   1/12 admit to [facility] 1/14 transfer to [facility]  Interim History / Subjective:  Feeling well, SOB improved after thoracentesis. Remains on levophed  9 mcg/min I/O + 4 L  Objective    Blood pressure 115/64, pulse (!) 102, temperature 97.8 F (36.6 C), temperature source Oral, resp. rate (!) 26, height 5' 10 (1.778 m), weight 55.3 kg, SpO2 95%.        Intake/Output Summary (Last 24 hours) at 07/08/2024 0932 Last data filed at 07/08/2024 0600 Gross per 24 hour  Intake 3726.83 ml  Output 1510 ml  Net 2216.83 ml   Filed Weights   07/06/24 0941  Weight: 55.3 kg    Examination: General: elderly man, no distress HENT: anicteric sclera, well injected conjunctivae, oral and nasal mucosa intact Lungs: decreased breath sounds in right lower lung base Cardiovascular: distant heart sounds Abdomen: soft, non-tender Extremities: no edema Neuro: A&O x 3, follows commands GU: deferred  Resolved problem list   Assessment and Plan   #Shock: unclear etiology of shock. Possible sepsis from pneumonia. - Obtain echocardiogram to better evaluate shock etiology - Wean levophed  as tolerated - Target MAP > 65 mmHg  #Atrial Fibrillation: - Heparin  drip - Amiodarone  200 mg PO daily  #Chronic Hypoxic Respiratory Failure: on 2 L O2 by [place]. Likely due to COPD, R pleural effusion and lung cancer - POCUS to re-evaluate R effusion and consider repeat thoracentesis versus IPC. - ICS/LAMA/LABA - Duonebs PRN  #CAP in an immunocompromised host: MRSA swab negative - Azithromycin  500 mg x 3 days (end date: 1/18) - Zosyn  (end date: 1/21)  #Stress Ulcer PPx: PPI  #Nutrition: regular diet  #Constipation: Bowl  regimen  #VTE ppx: on heparin  drip  Disposition: ICU appropriate for vasopressors  Patient Lines/Drains/Airways Status     Active Line/Drains/Airways     Name Placement date Placement time Site Days   Implanted Port 07/06/24 Left Chest  07/06/24  0940  Chest  2   Wound 07/06/24 0950 Pressure Injury Coccyx Bilateral Stage 1 -  Intact skin with non-blanchable redness of a localized area usually over a bony prominence. 07/06/24  0950  Coccyx  2            Labs   CBC: Recent Labs  Lab 07/06/24 1909 07/07/24 0725 07/08/24 0519  WBC 3.6* 3.3* 3.7*  HGB 9.9* 10.1* 9.2*  HCT 30.1* 30.9* 27.2*  MCV 88.0 87.0 85.0  PLT 153 173 190    Basic Metabolic Panel: Recent Labs  Lab 07/06/24 1909 07/07/24 0725 07/08/24 0519  NA 135 134* 134*  K 4.0 4.3 4.0  CL 98 98 100  CO2 29 29 25   GLUCOSE 133* 143* 138*  BUN 22 16 13   CREATININE 1.04 0.99 0.94  CALCIUM  8.0* 7.7* 7.6*  MG 1.3* 2.6* 2.2  PHOS  --  2.1* 2.4*   GFR: Estimated Creatinine Clearance: 54.7 mL/min (by C-G formula based on SCr of 0.94 mg/dL). Recent Labs  Lab 07/06/24 1909 07/07/24 0725 07/08/24 0519  WBC 3.6* 3.3* 3.7*    Liver Function Tests: Recent Labs  Lab 07/06/24 1147  PROT 6.4*   No results for input(s): LIPASE, AMYLASE in the last 168 hours. No results for input(s): AMMONIA in the last 168 hours.  ABG    Component Value Date/Time   HCO3 27.3 01/29/2024 1850   TCO2 27 05/30/2021 0810   O2SAT 63.7 01/29/2024 1850     Coagulation Profile: No results for input(s): INR, PROTIME in the last 168 hours.  Cardiac Enzymes: No results for input(s): CKTOTAL, CKMB, CKMBINDEX, TROPONINI in the last 168 hours.  HbA1C: HB A1C (BAYER DCA - WAIVED)  Date/Time Value Ref Range Status  01/07/2016 09:00 AM 5.9 <7.0 % Final    Comment:                                          Diabetic Adult            <7.0                                       Healthy Adult        4.3 - 5.7                                                           (DCCT/NGSP) American Diabetes Association's Summary of Glycemic Recommendations for Adults with Diabetes: Hemoglobin A1c <7.0%. More stringent glycemic goals (A1c <6.0%) may further reduce  complications at the cost of increased risk of hypoglycemia.    Hgb A1c MFr Bld  Date/Time Value Ref Range Status  10/02/2023 08:39 AM 6.1 (H) 4.8 - 5.6 % Final    Comment:             Prediabetes: 5.7 - 6.4  Diabetes: >6.4          Glycemic control for adults with diabetes: <7.0   07/13/2017 09:30 AM 5.9 (H) 4.8 - 5.6 % Final    Comment:             Prediabetes: 5.7 - 6.4          Diabetes: >6.4          Glycemic control for adults with diabetes: <7.0     CBG: Recent Labs  Lab 07/06/24 0942  GLUCAP 115*    Review of Systems:   Not obtained  Past Medical History:  He,  has a past medical history of Arthritis, Carotid artery stenosis without cerebral infarction, Chronic cholecystitis, Chronic constipation, COPD (chronic obstructive pulmonary disease) (HCC), Coronary artery disease (2002), Depression, DOE (dyspnea on exertion), Dyslipidemia, Essential hypertension, Full dentures, GERD (gastroesophageal reflux disease), History of adenomatous polyp of colon, History of COVID-19, History of diverticulitis of colon, History of kidney stones, Hyperlipidemia, Hypothyroidism, Lung nodule, Osteoporosis, Smokers' cough (HCC), and Wears glasses.   Surgical History:   Past Surgical History:  Procedure Laterality Date   BRONCHIAL BIOPSY  04/25/2024   Procedure: BRONCHOSCOPY, WITH BIOPSY;  Surgeon: Shelah Lamar RAMAN, MD;  Location: [facility] ENDOSCOPY;  Service: Pulmonary;;   BRONCHIAL BRUSHINGS  04/25/2024   Procedure: BRONCHOSCOPY, WITH BRUSH BIOPSY;  Surgeon: Shelah Lamar RAMAN, MD;  Location: [facility] ENDOSCOPY;  Service: Pulmonary;;   BRONCHIAL NEEDLE ASPIRATION BIOPSY  04/25/2024   Procedure: BRONCHOSCOPY, WITH NEEDLE ASPIRATION BIOPSY;  Surgeon: Shelah Lamar RAMAN, MD;  Location: [facility] ENDOSCOPY;  Service: Pulmonary;;   BRONCHOSCOPY  2005   CARDIAC CATHETERIZATION  08/11/2000   @[facility]  by dr morris;   preserved LVF,  ostial RCA 70-75%, other nonobstructive disease   CARDIAC CATHETERIZATION   03/30/2008   @[facility]  by dr hochrein;  occluded RCA and other nonobstructive involving LAD, CFx, D1,OM1  and left to right collateral filling,  mild inferior hypokinesis,  ef 55%   CAROTID ENDARTERECTOMY Right 09/24/2007   @[facility]  by dr eliza   COLONOSCOPY  01/19/2017   by armsbruster   EXCISION NEUROMA Left 10/01/2005   @[facility] ;  left inguinal   EXTRACORPOREAL SHOCK WAVE LITHOTRIPSY  2015   INGUINAL HERNIA REPAIR Left    01-13-2003 and 07-22-2006 both @[facility]    LAPAROSCOPIC CHOLECYSTECTOMY SINGLE SITE WITH INTRAOPERATIVE CHOLANGIOGRAM N/A 05/30/2021   Procedure: LAPAROSCOPIC CHOLECYSTECTOMY SINGLE SITE WITH INTRAOPERATIVE CHOLANGIOGRAM;  Surgeon: Sheldon Standing, MD;  Location: [facility];  Service: General;  Laterality: N/A;   LAPAROSCOPIC INGUINAL HERNIA REPAIR Bilateral 04/28/2007   @[facility]    LEFT HEART CATH AND CORONARY ANGIOGRAPHY N/A 08/04/2017   Procedure: LEFT HEART CATH AND CORONARY ANGIOGRAPHY;  Surgeon: Verlin Lonni BIRCH, MD;  Location: [facility] INVASIVE CV LAB;  Service: Cardiovascular;  Laterality: N/A;   THROAT SURGERY  1997   pt stated removal of tumor that was benign   VIDEO BRONCHOSCOPY WITH ENDOBRONCHIAL ULTRASOUND Right 04/25/2024   Procedure: BRONCHOSCOPY, WITH EBUS;  Surgeon: Shelah Lamar RAMAN, MD;  Location: [facility] ENDOSCOPY;  Service: Pulmonary;  Laterality: Right;  Right hilar mass and suspected  lymph node involvement     Social History:   reports that he quit smoking about 5 months ago. His smoking use included cigarettes. He started smoking about 12 months ago. He has a 58.9 pack-year smoking history. He has never used smokeless tobacco. He reports that he does not currently use alcohol. He reports that he does not use drugs.   Family History:  His family  history includes Alcohol abuse in his brother; Colon cancer (age of onset: 75) in his brother; Colon polyps in his brother; Healthy in his daughter, son, son, and son; Lung disease in his brother, brother, and father;  Lupus in his brother and mother. There is no history of Esophageal cancer, Rectal cancer, Stomach cancer, Pancreatic cancer, Crohn's disease, or Ulcerative colitis.   Allergies Allergies[1]   Home Medications  Prior to Admission medications  Medication Sig Start Date End Date Taking? Authorizing Provider  albuterol  (PROVENTIL ) (2.5 MG/3ML) 0.083% nebulizer solution INHALE ONE vial by NEBULIZER EVERY 6 HOURS AS NEEDED FOR WHEEZING OR SHORTNESS OF BREATH 07/01/24  Yes Dettinger, [name], MD  albuterol  (VENTOLIN  HFA) 108 (90 Base) MCG/ACT inhaler Inhale 2 puffs into the lungs every 6 (six) hours as needed for wheezing or shortness of breath. 07/02/24  Yes [provider]  ALPRAZolam  (XANAX ) 0.5 MG tablet Take 0.5 mg by mouth at bedtime as needed for sleep or anxiety. 06/24/24  Yes [provider]  amiodarone  (PACERONE ) 200 MG tablet Take 400 mg by mouth 2 (two) times daily. Take 2 tablets (400 mg total) by mouth 2 (two) times a day with meals for 5 days, THEN 1 tablet (200 mg total) 2 (two) times a day with meals for 7 days, THEN 1 tablet (200 mg total) daily.   Yes [provider]  Bempedoic Acid -Ezetimibe  (NEXLIZET ) 180-10 MG TABS TAKE ONE TABLET DAILY 12/22/23  Yes Dettinger, [name], MD  bisoprolol  (ZEBETA ) 5 MG tablet Take 2.5 mg by mouth daily. 06/22/24  Yes [provider]  budesonide -glycopyrrolate -formoterol  (BREZTRI  AEROSPHERE) 160-9-4.8 MCG/ACT AERO inhaler Inhale 2 puffs into the lungs in the morning and at bedtime. 01/31/24  Yes Emokpae, Courage, MD  ELIQUIS  5 MG TABS tablet Take 5 mg by mouth 2 (two) times daily. 06/22/24  Yes [provider]  fluticasone  (FLONASE ) 50 MCG/ACT nasal spray Place 2 sprays into both nostrils daily. 08/10/23  Yes [facility] Morton Hummer, Nena, NP  HYDROcodone bit-homatropine (HYCODAN) 5-1.5 MG/5ML syrup Take 5 mLs by mouth 4 (four) times daily as needed for cough. 06/30/24  Yes [provider]  hydrOXYzine  (VISTARIL )  25 MG capsule TAKE ONE CAPSULE EVERY 8 HOURS AS NEEDED 05/12/24  Yes Dettinger, [name], MD  levothyroxine  (SYNTHROID ) 50 MCG tablet Take 1 tablet (50 mcg total) by mouth daily before breakfast. 01/31/24  Yes Emokpae, Courage, MD  nitroGLYCERIN  (NITROSTAT ) 0.4 MG SL tablet Place 1 tablet (0.4 mg total) under the tongue every 5 (five) minutes as needed for chest pain. 02/26/24 05/30/97 Yes Miriam Norris, NP  omeprazole  (PRILOSEC) 20 MG capsule Take 1 capsule (20 mg total) by mouth daily. 01/31/24  Yes Pearlean Manus, MD  ondansetron  (ZOFRAN ) 8 MG tablet Take 8 mg by mouth every 8 (eight) hours as needed for vomiting. 06/06/24  Yes [provider]  prochlorperazine (COMPAZINE) 10 MG tablet Take 10 mg by mouth every 8 (eight) hours as needed for nausea or vomiting. 06/06/24  Yes [provider]  traZODone  (DESYREL ) 100 MG tablet Take 1 tablet (100 mg total) by mouth at bedtime. 02/11/24  Yes Dettinger, [name], MD  cefdinir  (OMNICEF ) 300 MG capsule Take 300 mg by mouth 2 (two) times daily. Patient not taking: Reported on 07/07/2024 06/28/24   [provider]  doxycycline  (VIBRA -TABS) 100 MG tablet Take 100 mg by mouth 2 (two) times daily. Patient not taking: Reported on 07/07/2024 06/28/24   [provider]  OXYGEN  Inhale 2 L into the  lungs daily.    [provider]  valsartan  (DIOVAN ) 80 MG tablet Take 80 mg by mouth daily. Patient not taking: Reported on 07/07/2024 05/13/24   [provider]     Due to a high probability of clinically significant, life threatening deterioration, the patient required my highest level of preparedness to intervene emergently and I personally spent this critical care time directly and personally managing the patient. This critical care time included obtaining a history; examining the patient; pulse oximetry; ordering and review of studies; arranging urgent treatment with development of a management plan; evaluation of  patient's response to treatment; frequent reassessment; and, discussions with other providers.  This critical care time was performed to assess and manage the high probability of imminent, life-threatening deterioration that could result in multi-organ failure. It was exclusive of separately billable procedures and treating other patients and teaching time. Critical Care Time: 35 minutes.  Paula Southerly, MD [facility]             [1]  Allergies Allergen Reactions   Flomax  [Tamsulosin  Hcl] Other (See Comments)    Made patient hypotensive, clammy, sweaty, and feel like he was going to pass out (EMS had to be called)   Crestor [Rosuvastatin Calcium ] Other (See Comments)    Lethargy and muscle aches   Lipitor [Atorvastatin Calcium ] Other (See Comments)    Body cramps, lethargy, and muscle aches   "

## 2024-07-09 ENCOUNTER — Inpatient Hospital Stay (HOSPITAL_COMMUNITY)

## 2024-07-09 DIAGNOSIS — J90 Pleural effusion, not elsewhere classified: Secondary | ICD-10-CM | POA: Diagnosis not present

## 2024-07-09 DIAGNOSIS — R579 Shock, unspecified: Secondary | ICD-10-CM | POA: Diagnosis not present

## 2024-07-09 DIAGNOSIS — J9611 Chronic respiratory failure with hypoxia: Secondary | ICD-10-CM | POA: Diagnosis not present

## 2024-07-09 DIAGNOSIS — J189 Pneumonia, unspecified organism: Secondary | ICD-10-CM | POA: Diagnosis not present

## 2024-07-09 DIAGNOSIS — I4891 Unspecified atrial fibrillation: Secondary | ICD-10-CM | POA: Diagnosis not present

## 2024-07-09 DIAGNOSIS — J449 Chronic obstructive pulmonary disease, unspecified: Secondary | ICD-10-CM | POA: Diagnosis not present

## 2024-07-09 LAB — BASIC METABOLIC PANEL WITH GFR
Anion gap: 6 (ref 5–15)
Anion gap: 9 (ref 5–15)
BUN: 9 mg/dL (ref 8–23)
BUN: 9 mg/dL (ref 8–23)
CO2: 28 mmol/L (ref 22–32)
CO2: 28 mmol/L (ref 22–32)
Calcium: 7.6 mg/dL — ABNORMAL LOW (ref 8.9–10.3)
Calcium: 7.7 mg/dL — ABNORMAL LOW (ref 8.9–10.3)
Chloride: 102 mmol/L (ref 98–111)
Chloride: 98 mmol/L (ref 98–111)
Creatinine, Ser: 0.88 mg/dL (ref 0.61–1.24)
Creatinine, Ser: 0.95 mg/dL (ref 0.61–1.24)
GFR, Estimated: >60 mL/min
GFR, Estimated: >60 mL/min
Glucose, Bld: 116 mg/dL — ABNORMAL HIGH (ref 70–99)
Glucose, Bld: 147 mg/dL — ABNORMAL HIGH (ref 70–99)
Potassium: 3.8 mmol/L (ref 3.5–5.1)
Potassium: 4.6 mmol/L (ref 3.5–5.1)
Sodium: 137 mmol/L (ref 135–145)
Sodium: 135 mmol/L (ref 135–145)

## 2024-07-09 LAB — CBC
HCT: 25.4 % — ABNORMAL LOW (ref 39.0–52.0)
Hemoglobin: 8.4 g/dL — ABNORMAL LOW (ref 13.0–17.0)
MCH: 28.7 pg (ref 26.0–34.0)
MCHC: 33.1 g/dL (ref 30.0–36.0)
MCV: 86.7 fL (ref 80.0–100.0)
Platelets: 150 K/uL (ref 150–400)
RBC: 2.93 MIL/uL — ABNORMAL LOW (ref 4.22–5.81)
RDW: 15.2 % (ref 11.5–15.5)
WBC: 2.4 K/uL — ABNORMAL LOW (ref 4.0–10.5)
nRBC: 0 % (ref 0.0–0.2)

## 2024-07-09 LAB — BODY FLUID CULTURE W GRAM STAIN
Culture: NO GROWTH
Gram Stain: NONE SEEN

## 2024-07-09 LAB — GLUCOSE, CAPILLARY
Glucose-Capillary: 107 mg/dL — ABNORMAL HIGH (ref 70–99)
Glucose-Capillary: 118 mg/dL — ABNORMAL HIGH (ref 70–99)
Glucose-Capillary: 114 mg/dL — ABNORMAL HIGH (ref 70–99)
Glucose-Capillary: 120 mg/dL — ABNORMAL HIGH (ref 70–99)
Glucose-Capillary: 107 mg/dL — ABNORMAL HIGH (ref 70–99)

## 2024-07-09 LAB — MAGNESIUM
Magnesium: 1.8 mg/dL (ref 1.7–2.4)
Magnesium: 2.3 mg/dL (ref 1.7–2.4)

## 2024-07-09 LAB — COOXEMETRY PANEL
Carboxyhemoglobin: 1.9 % — ABNORMAL HIGH (ref 0.5–1.5)
Methemoglobin: <0.7 % (ref 0.0–1.5)
O2 Saturation: 70.5 %
Total hemoglobin: 9.8 g/dL — ABNORMAL LOW (ref 12.0–16.0)

## 2024-07-09 LAB — APTT: aPTT: 73 s — ABNORMAL HIGH (ref 24–36)

## 2024-07-09 LAB — PHOSPHORUS: Phosphorus: 2.5 mg/dL (ref 2.5–4.6)

## 2024-07-09 LAB — HEPARIN LEVEL (UNFRACTIONATED): Heparin Unfractionated: 0.41 [IU]/mL (ref 0.30–0.70)

## 2024-07-09 MED ORDER — FUROSEMIDE 10 MG/ML IJ SOLN
40.0000 mg | Freq: Once | INTRAMUSCULAR | Status: AC
  Administered 2024-07-09: 40 mg via INTRAVENOUS
  Filled 2024-07-09: qty 4

## 2024-07-09 MED ORDER — MAGNESIUM SULFATE 2 GM/50ML IV SOLN
2.0000 g | Freq: Once | INTRAVENOUS | Status: AC
  Administered 2024-07-09: 2 g via INTRAVENOUS
  Filled 2024-07-09: qty 50

## 2024-07-09 MED ORDER — POTASSIUM CHLORIDE CRYS ER 20 MEQ PO TBCR
40.0000 meq | EXTENDED_RELEASE_TABLET | Freq: Once | ORAL | Status: AC
  Administered 2024-07-09: 40 meq via ORAL
  Filled 2024-07-09: qty 2

## 2024-07-09 NOTE — Plan of Care (Signed)

## 2024-07-09 NOTE — Progress Notes (Signed)
 "  NAME:  [name] [name], MRN:  [id number], DOB:  [birth year], LOS: 3 ADMISSION DATE:  07/06/2024, CONSULTATION DATE:  07/09/24 REFERRING MD:  MELODIE, CHIEF COMPLAINT:  SOB   History of Present Illness:  [name] [name] is a 74 y.o. male who has a PMH as below including but not limited to right NSCLC first diagnosed 04/25/24 after bx via bronchoscopy and currently undergoing chemo (Carboplatin/Paclitaxel weekly) and XRT under the care of Dr. Letty at [facility] (started 06/06/24), Emphysema, R effusion unknown if had thoracentesis 06/27/24 (no studies sent), A.fib on Apixaban .   He was admitted to [facility] R 07/04/24 with dyspnea. CTA was negative for PE but he  was found to have a right effusion and atlectasis. A right thora was planned but was aborted due to increased WOB and diaphragmatic excursion into the field of view causing limited safe window per notes in care everywhere. It was recommended that he be transferred to a tertiary center for further evaluation.   On 1/14, he was transferred to [facility]. He has been on NE for BP support after receiving Lasix  in the ED at [facility] R with resultant hypotension.  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   Significant Hospital Events: Including procedures, antibiotic start  and stop dates in addition to other pertinent events   1/12 admit to [facility] R 1/14 transfer to [facility]  Interim History / Subjective:  Patient notes some dyspnea Afebrile, HR 90 - 100s, hemodynamics supported with vasopressors, SPO2 > 90% on RA Drips: Levophed  7 mcg/min Objective    Blood pressure 125/69, pulse (!) 105, temperature 97.7 F (36.5 C), temperature source Oral, resp. rate (!) 30, height 5' 10 (1.778 m), weight 59 kg, SpO2 96%.        Intake/Output Summary (Last 24 hours) at 07/09/2024 0903 Last data filed at 07/09/2024 0541 Gross per 24 hour  Intake 1502.28 ml  Output 950 ml  Net 552.28 ml   Filed Weights   07/06/24 0941 07/09/24 0500  Weight: 55.3 kg 59 kg    Examination: General: well appearing, no distress, slightly tachypneic HENT: anicteric sclera, well injected conjunctivae, oral  Lungs: decreased breath sounds in right lower lung field, clear anteriorly Cardiovascular: Distant heart sounds, JVD at 10 cm H2O Abdomen: soft, non-tender Extremities: 1+ pitting edema Neuro: Alert, oriented x 3 GU: deferred  Resolved problem list   Assessment and Plan   #Shock: likely mixed distributive (septic) and cardiogenic shock state. CO estimate via Bernouli equation is 4.7 L/min. Using fick equation and coox, estimated CO is 7 L/min. Echo with EF reduced to 25% from previous 45% - Trend coox - Continue to wean levophed  as tolerated - Antibiotics for pneumonia - Target MAP > 65 mmHg  #HFrEF: > Appears slightly hypervolemic on examination. - Lasix  40 mg IVP x 1 - Holding GDMT given hypotension - May consider consulting cardiology if Coox downtrending  #Atrial Fibrillation: - Heparin  drip - Amiodarone  200 mg daily   #R  malignant effusion: CXR shows re accumulation. 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 - Will need medical  oncology as an OP for chemoRx.  #CAP: - Azithromycin  500 mg x 3 days (ends 1/18) - Zosyn  (ends 1/21)  #Stress Ulcer PPx: PPI  #Nutrition: regular diet  #VTE ppx: heparin  drip  Disposition: ICU appropriate for vasopressors  Patient Lines/Drains/Airways Status     Active Line/Drains/Airways     Name Placement date Placement time Site Days   Implanted Port 07/06/24 Left Chest 07/06/24  0940  Chest  3   Wound 07/06/24 0950 Pressure Injury Coccyx Bilateral Stage 1 -  Intact skin with non-blanchable redness of a localized area usually over a bony prominence. 07/06/24  0950  Coccyx  3            Labs   CBC: Recent Labs  Lab 07/06/24 1909 07/07/24 0725 07/08/24 0519 07/09/24 0445  WBC 3.6* 3.3* 3.7* 2.4*  HGB 9.9* 10.1* 9.2* 8.4*  HCT 30.1* 30.9* 27.2* 25.4*  MCV 88.0 87.0 85.0 86.7  PLT 153 173 190 150    Basic Metabolic Panel: Recent Labs  Lab 07/06/24 1909 07/07/24 0725 07/08/24 0519 07/09/24 0445  NA 135 134* 134* 137  K 4.0 4.3 4.0 3.8  CL 98 98 100 102  CO2 29 29 25 28   GLUCOSE 133* 143* 138* 116*  BUN 22 16 13 9   CREATININE 1.04 0.99 0.94 0.88  CALCIUM  8.0* 7.7* 7.6* 7.6*  MG 1.3* 2.6* 2.2 1.8  PHOS  --  2.1* 2.4* 2.5   GFR: Estimated Creatinine Clearance: 62.4 mL/min (by C-G formula based on SCr of 0.88 mg/dL). Recent Labs  Lab 07/06/24 1909 07/07/24 0725 07/08/24 0519 07/09/24 0445  WBC 3.6* 3.3* 3.7* 2.4*    Liver Function Tests: Recent Labs  Lab 07/06/24 1147  PROT 6.4*   No results for input(s): LIPASE, AMYLASE in the last 168 hours. No results for input(s): AMMONIA in the last 168 hours.  ABG    Component Value Date/Time   HCO3 27.3 01/29/2024 1850   TCO2 27 05/30/2021 0810   O2SAT 71.7 07/08/2024 1731     Coagulation Profile: No results for input(s): INR, PROTIME in the last 168 hours.  Cardiac Enzymes: No results for input(s): CKTOTAL, CKMB, CKMBINDEX, TROPONINI in the last 168 hours.  HbA1C: HB  A1C (BAYER DCA - WAIVED)  Date/Time Value Ref Range Status  01/07/2016 09:00 AM 5.9 <7.0 % Final    Comment:                                          Diabetic Adult            <7.0                                       Healthy Adult        4.3 - 5.7                                                           (  DCCT/NGSP) American Diabetes Association's Summary of Glycemic Recommendations for Adults with Diabetes: Hemoglobin A1c <7.0%. More stringent glycemic goals (A1c <6.0%) may further reduce complications at the cost of increased risk of hypoglycemia.    Hgb A1c MFr Bld  Date/Time Value Ref Range Status  10/02/2023 08:39 AM 6.1 (H) 4.8 - 5.6 % Final    Comment:             Prediabetes: 5.7 - 6.4          Diabetes: >6.4          Glycemic control for adults with diabetes: <7.0   07/13/2017 09:30 AM 5.9 (H) 4.8 - 5.6 % Final    Comment:             Prediabetes: 5.7 - 6.4          Diabetes: >6.4          Glycemic control for adults with diabetes: <7.0     CBG: Recent Labs  Lab 07/06/24 0942 07/09/24 0721  GLUCAP 115* 107*    Review of Systems:   Not obtained  Past Medical History:  He,  has a past medical history of Arthritis, Carotid artery stenosis without cerebral infarction, Chronic cholecystitis, Chronic constipation, COPD (chronic obstructive pulmonary disease) (HCC), Coronary artery disease (2002), Depression, DOE (dyspnea on exertion), Dyslipidemia, Essential hypertension, Full dentures, GERD (gastroesophageal reflux disease), History of adenomatous polyp of colon, History of COVID-19, History of diverticulitis of colon, History of kidney stones, Hyperlipidemia, Hypothyroidism, Lung nodule, Osteoporosis, Smokers' cough (HCC), and Wears glasses.   Surgical History:   Past Surgical History:  Procedure Laterality Date   BRONCHIAL BIOPSY  04/25/2024   Procedure: BRONCHOSCOPY, WITH BIOPSY;  Surgeon: Shelah Lamar RAMAN, MD;  Location: [facility] ENDOSCOPY;  Service: Pulmonary;;    BRONCHIAL BRUSHINGS  04/25/2024   Procedure: BRONCHOSCOPY, WITH BRUSH BIOPSY;  Surgeon: Shelah Lamar RAMAN, MD;  Location: [facility] ENDOSCOPY;  Service: Pulmonary;;   BRONCHIAL NEEDLE ASPIRATION BIOPSY  04/25/2024   Procedure: BRONCHOSCOPY, WITH NEEDLE ASPIRATION BIOPSY;  Surgeon: Shelah Lamar RAMAN, MD;  Location: [facility] ENDOSCOPY;  Service: Pulmonary;;   BRONCHOSCOPY  2005   CARDIAC CATHETERIZATION  08/11/2000   @[facility]  by dr morris;   preserved LVF,  ostial RCA 70-75%, other nonobstructive disease   CARDIAC CATHETERIZATION  03/30/2008   @[facility]  by dr hochrein;  occluded RCA and other nonobstructive involving LAD, CFx, D1,OM1  and left to right collateral filling,  mild inferior hypokinesis,  ef 55%   CAROTID ENDARTERECTOMY Right 09/24/2007   @[facility]  by dr eliza   COLONOSCOPY  01/19/2017   by armsbruster   EXCISION NEUROMA Left 10/01/2005   @[facility] ;  left inguinal   EXTRACORPOREAL SHOCK WAVE LITHOTRIPSY  2015   INGUINAL HERNIA REPAIR Left    01-13-2003 and 07-22-2006 both @[facility]    LAPAROSCOPIC CHOLECYSTECTOMY SINGLE SITE WITH INTRAOPERATIVE CHOLANGIOGRAM N/A 05/30/2021   Procedure: LAPAROSCOPIC CHOLECYSTECTOMY SINGLE SITE WITH INTRAOPERATIVE CHOLANGIOGRAM;  Surgeon: Sheldon Standing, MD;  Location: [facility];  Service: General;  Laterality: N/A;   LAPAROSCOPIC INGUINAL HERNIA REPAIR Bilateral 04/28/2007   @[facility]    LEFT HEART CATH AND CORONARY ANGIOGRAPHY N/A 08/04/2017   Procedure: LEFT HEART CATH AND CORONARY ANGIOGRAPHY;  Surgeon: Verlin Lonni BIRCH, MD;  Location: [facility] INVASIVE CV LAB;  Service: Cardiovascular;  Laterality: N/A;   THROAT SURGERY  1997   pt stated removal of tumor that was benign   VIDEO BRONCHOSCOPY WITH ENDOBRONCHIAL ULTRASOUND Right 04/25/2024   Procedure: BRONCHOSCOPY, WITH EBUS;  Surgeon: Shelah,  Lamar RAMAN, MD;  Location: [facility] ENDOSCOPY;  Service: Pulmonary;  Laterality: Right;  Right hilar mass and suspected  lymph node involvement     Social History:   reports that he quit smoking  about 5 months ago. His smoking use included cigarettes. He started smoking about 12 months ago. He has a 58.9 pack-year smoking history. He has never used smokeless tobacco. He reports that he does not currently use alcohol. He reports that he does not use drugs.   Family History:  His family history includes Alcohol abuse in his brother; Colon cancer (age of onset: 88) in his brother; Colon polyps in his brother; Healthy in his daughter, son, son, and son; Lung disease in his brother, brother, and father; Lupus in his brother and mother. There is no history of Esophageal cancer, Rectal cancer, Stomach cancer, Pancreatic cancer, Crohn's disease, or Ulcerative colitis.   Allergies Allergies[1]   Home Medications  Prior to Admission medications  Medication Sig Start Date End Date Taking? Authorizing Provider  albuterol  (PROVENTIL ) (2.5 MG/3ML) 0.083% nebulizer solution INHALE ONE vial by NEBULIZER EVERY 6 HOURS AS NEEDED FOR WHEEZING OR SHORTNESS OF BREATH 07/01/24  Yes Dettinger, [name], MD  albuterol  (VENTOLIN  HFA) 108 (90 Base) MCG/ACT inhaler Inhale 2 puffs into the lungs every 6 (six) hours as needed for wheezing or shortness of breath. 07/02/24  Yes [provider]  ALPRAZolam  (XANAX ) 0.5 MG tablet Take 0.5 mg by mouth at bedtime as needed for sleep or anxiety. 06/24/24  Yes [provider]  amiodarone  (PACERONE ) 200 MG tablet Take 400 mg by mouth 2 (two) times daily. Take 2 tablets (400 mg total) by mouth 2 (two) times a day with meals for 5 days, THEN 1 tablet (200 mg total) 2 (two) times a day with meals for 7 days, THEN 1 tablet (200 mg total) daily.   Yes [provider]  Bempedoic Acid -Ezetimibe  (NEXLIZET ) 180-10 MG TABS TAKE ONE TABLET DAILY 12/22/23  Yes Dettinger, [name], MD  bisoprolol  (ZEBETA ) 5 MG tablet Take 2.5 mg by mouth daily. 06/22/24  Yes [provider]  budesonide -glycopyrrolate -formoterol  (BREZTRI  AEROSPHERE) 160-9-4.8 MCG/ACT AERO inhaler  Inhale 2 puffs into the lungs in the morning and at bedtime. 01/31/24  Yes Emokpae, Courage, MD  ELIQUIS  5 MG TABS tablet Take 5 mg by mouth 2 (two) times daily. 06/22/24  Yes [provider]  fluticasone  (FLONASE ) 50 MCG/ACT nasal spray Place 2 sprays into both nostrils daily. 08/10/23  Yes [facility] Morton Hummer, Nena, NP  HYDROcodone bit-homatropine (HYCODAN) 5-1.5 MG/5ML syrup Take 5 mLs by mouth 4 (four) times daily as needed for cough. 06/30/24  Yes [provider]  hydrOXYzine  (VISTARIL ) 25 MG capsule TAKE ONE CAPSULE EVERY 8 HOURS AS NEEDED 05/12/24  Yes Dettinger, [name], MD  levothyroxine  (SYNTHROID ) 50 MCG tablet Take 1 tablet (50 mcg total) by mouth daily before breakfast. 01/31/24  Yes Emokpae, Courage, MD  nitroGLYCERIN  (NITROSTAT ) 0.4 MG SL tablet Place 1 tablet (0.4 mg total) under the tongue every 5 (five) minutes as needed for chest pain. 02/26/24 05/30/97 Yes Miriam Norris, NP  omeprazole  (PRILOSEC) 20 MG capsule Take 1 capsule (20 mg total) by mouth daily. 01/31/24  Yes Pearlean Manus, MD  ondansetron  (ZOFRAN ) 8 MG tablet Take 8 mg by mouth every 8 (eight) hours as needed for vomiting. 06/06/24  Yes [provider]  prochlorperazine (COMPAZINE) 10 MG tablet Take 10 mg by mouth every 8 (eight) hours as needed for nausea or vomiting. 06/06/24  Yes [provider]  traZODone  (DESYREL ) 100 MG tablet Take 1 tablet (100 mg total) by mouth at bedtime. 02/11/24  Yes Dettinger, [name], MD  cefdinir  (OMNICEF ) 300 MG capsule Take 300 mg by mouth 2 (two) times daily. Patient not taking: Reported on 07/07/2024 06/28/24   [provider]  doxycycline  (VIBRA -TABS) 100 MG tablet Take 100 mg by mouth 2 (two) times daily. Patient not taking: Reported on 07/07/2024 06/28/24   [provider]  OXYGEN  Inhale 2 L into the lungs daily.    [provider]  valsartan  (DIOVAN ) 80 MG tablet Take 80 mg by mouth daily. Patient not taking: Reported on  07/07/2024 05/13/24   [provider]     Due to a high probability of clinically significant, life threatening deterioration, the patient required my highest level of preparedness to intervene emergently and I personally spent this critical care time directly and personally managing the patient. This critical care time included obtaining a history; examining the patient; pulse oximetry; ordering and review of studies; arranging urgent treatment with development of a management plan; evaluation of patient's response to treatment; frequent reassessment; and, discussions with other providers.  This critical care time was performed to assess and manage the high probability of imminent, life-threatening deterioration that could result in multi-organ failure. It was exclusive of separately billable procedures and treating other patients and teaching time. Critical Care Time: 35 minutes.  Paula Southerly, MD [facility]             [1]  Allergies Allergen Reactions   Flomax  [Tamsulosin  Hcl] Other (See Comments)    Made patient hypotensive, clammy, sweaty, and feel like he was going to pass out (EMS had to be called)   Crestor [Rosuvastatin Calcium ] Other (See Comments)    Lethargy and muscle aches   Lipitor [Atorvastatin Calcium ] Other (See Comments)    Body cramps, lethargy, and muscle aches   "

## 2024-07-09 NOTE — Procedures (Signed)
 Thoracentesis  Procedure Note  [name] [name]  [id number]  15-Jul-1950  Date:07/09/24  Time:3:35 PM   Provider Performing:Beryl Hornberger [name] [name]   Procedure: Thoracentesis with imaging guidance (67444)  Indication(s) Pleural Effusion  Consent Risks of the procedure as well as the alternatives and risks of each were explained to the patient and/or caregiver.  Consent for the procedure was obtained and is signed in the bedside chart  Anesthesia Topical only with 1% lidocaine     Time Out Verified patient identification, verified procedure, site/side was marked, verified correct patient position, special equipment/implants available, medications/allergies/relevant history reviewed, required imaging and test results available.   Sterile Technique Maximal sterile technique including full sterile barrier drape, hand hygiene, sterile gown, sterile gloves, mask, hair covering, sterile ultrasound probe cover (if used).  Procedure Description Ultrasound was used to identify appropriate pleural anatomy for placement and overlying skin marked.  Area of drainage cleaned and draped in sterile fashion. Lidocaine  was used to anesthetize the skin and subcutaneous tissue.  1400 cc's of yellow appearing fluid was drained from the right pleural space. Catheter then removed and bandaid applied to site.   Complications/Tolerance None; patient tolerated the procedure well. Chest X-ray is ordered to confirm no post-procedural complication.   EBL Minimal   Specimen(s) None - known malignant effusion.

## 2024-07-09 NOTE — Progress Notes (Signed)
 PHARMACY - ANTICOAGULATION CONSULT NOTE  Pharmacy Consult:  Heparin  Indication: atrial fibrillation  Allergies[1]  Patient Measurements: Height: 5' 10 (177.8 cm) Weight: 59 kg (130 lb 1.1 oz) IBW/kg (Calculated) : 73 HEPARIN  DW (KG): 55.3  Vital Signs: Temp: 97.7 F (36.5 C) (01/17 0750) Temp Source: Oral (01/17 0750) BP: 125/69 (01/17 0746) Pulse Rate: 105 (01/17 0746)  Labs: Recent Labs    07/07/24 0725 07/07/24 0732 07/08/24 0519 07/09/24 0445  HGB 10.1*  --  9.2* 8.4*  HCT 30.9*  --  27.2* 25.4*  PLT 173  --  190 150  APTT  --  100* 97* 73*  HEPARINUNFRC >1.10*  --  0.75* 0.41  CREATININE 0.99  --  0.94 0.88    Estimated Creatinine Clearance: 62.4 mL/min (by C-G formula based on SCr of 0.88 mg/dL).   Assessment: 40 YOM with lung cancer transferred from [facility] to [facility] for thoracentesis.  Patient has a history of Afib on Eliquis  PTA.  Last Eliquis  dose documented on 1/13  AM per [facility] report upon transfer.  Pharmacy consulted to manage IV heparin .    aPTT remains therapeutic range. Heparin  level falsely elevated by prior Eliquis , but now trending down and starting to correlate with aPTT.  CBC stable; no bleeding reported.  Goal of Therapy:  Heparin  level 0.3-0.7 units/ml aPTT 66-102 seconds Monitor platelets by anticoagulation protocol: Yes   Plan:  Cotninue IV heparin  slightly to 550 units/hr Daily heparin  level, aPTT, and CBC - may be able to use Heparin  level alone soon  Harlene Boga, PharmD Please refer to [facility] for [facility] Pharmacy numbers 07/09/2024, 8:42 AM      [1]  Allergies Allergen Reactions   Flomax  [Tamsulosin  Hcl] Other (See Comments)    Made patient hypotensive, clammy, sweaty, and feel like he was going to pass out (EMS had to be called)   Crestor [Rosuvastatin Calcium ] Other (See Comments)    Lethargy and muscle aches   Lipitor [Atorvastatin Calcium ] Other (See Comments)    Body cramps, lethargy, and muscle aches

## 2024-07-10 ENCOUNTER — Encounter (HOSPITAL_COMMUNITY): Payer: Self-pay | Admitting: Pulmonary Disease

## 2024-07-10 DIAGNOSIS — I4891 Unspecified atrial fibrillation: Secondary | ICD-10-CM | POA: Diagnosis not present

## 2024-07-10 DIAGNOSIS — J9611 Chronic respiratory failure with hypoxia: Secondary | ICD-10-CM | POA: Diagnosis not present

## 2024-07-10 DIAGNOSIS — J449 Chronic obstructive pulmonary disease, unspecified: Secondary | ICD-10-CM | POA: Diagnosis not present

## 2024-07-10 DIAGNOSIS — R579 Shock, unspecified: Secondary | ICD-10-CM | POA: Diagnosis not present

## 2024-07-10 LAB — CBC
HCT: 28.4 % — ABNORMAL LOW (ref 39.0–52.0)
Hemoglobin: 9.3 g/dL — ABNORMAL LOW (ref 13.0–17.0)
MCH: 28.7 pg (ref 26.0–34.0)
MCHC: 32.7 g/dL (ref 30.0–36.0)
MCV: 87.7 fL (ref 80.0–100.0)
Platelets: 172 K/uL (ref 150–400)
RBC: 3.24 MIL/uL — ABNORMAL LOW (ref 4.22–5.81)
RDW: 15.3 % (ref 11.5–15.5)
WBC: 2.9 K/uL — ABNORMAL LOW (ref 4.0–10.5)
nRBC: 0 % (ref 0.0–0.2)

## 2024-07-10 LAB — HEPARIN LEVEL (UNFRACTIONATED): Heparin Unfractionated: 0.33 [IU]/mL (ref 0.30–0.70)

## 2024-07-10 LAB — BASIC METABOLIC PANEL WITH GFR
Anion gap: 7 (ref 5–15)
BUN: 10 mg/dL (ref 8–23)
CO2: 28 mmol/L (ref 22–32)
Calcium: 8 mg/dL — ABNORMAL LOW (ref 8.9–10.3)
Chloride: 99 mmol/L (ref 98–111)
Creatinine, Ser: 1.02 mg/dL (ref 0.61–1.24)
GFR, Estimated: >60 mL/min
Glucose, Bld: 117 mg/dL — ABNORMAL HIGH (ref 70–99)
Potassium: 4.6 mmol/L (ref 3.5–5.1)
Sodium: 134 mmol/L — ABNORMAL LOW (ref 135–145)

## 2024-07-10 LAB — GLUCOSE, CAPILLARY
Glucose-Capillary: 118 mg/dL — ABNORMAL HIGH (ref 70–99)
Glucose-Capillary: 109 mg/dL — ABNORMAL HIGH (ref 70–99)
Glucose-Capillary: 105 mg/dL — ABNORMAL HIGH (ref 70–99)
Glucose-Capillary: 104 mg/dL — ABNORMAL HIGH (ref 70–99)

## 2024-07-10 LAB — COOXEMETRY PANEL
Carboxyhemoglobin: 0.9 % (ref 0.5–1.5)
Methemoglobin: 1 % (ref 0.0–1.5)
O2 Saturation: 52.3 %
Total hemoglobin: 10.5 g/dL — ABNORMAL LOW (ref 12.0–16.0)

## 2024-07-10 LAB — APTT: aPTT: 76 s — ABNORMAL HIGH (ref 24–36)

## 2024-07-10 LAB — MAGNESIUM: Magnesium: 2.1 mg/dL (ref 1.7–2.4)

## 2024-07-10 LAB — PHOSPHORUS: Phosphorus: 2.2 mg/dL — ABNORMAL LOW (ref 2.5–4.6)

## 2024-07-10 MED ORDER — K PHOS MONO-SOD PHOS DI & MONO 155-852-130 MG PO TABS
500.0000 mg | ORAL_TABLET | ORAL | Status: AC
  Administered 2024-07-10 (×3): 500 mg via ORAL
  Filled 2024-07-10 (×3): qty 2

## 2024-07-10 MED ORDER — FUROSEMIDE 10 MG/ML IJ SOLN
40.0000 mg | Freq: Once | INTRAMUSCULAR | Status: AC
  Administered 2024-07-10: 40 mg via INTRAVENOUS
  Filled 2024-07-10: qty 4

## 2024-07-10 MED ORDER — MIDODRINE HCL 5 MG PO TABS
10.0000 mg | ORAL_TABLET | Freq: Three times a day (TID) | ORAL | Status: DC
  Administered 2024-07-10 (×2): 10 mg via ORAL
  Filled 2024-07-10 (×2): qty 2

## 2024-07-10 MED ORDER — GUAIFENESIN-CODEINE 100-10 MG/5ML PO SOLN
10.0000 mL | ORAL | Status: DC | PRN
  Administered 2024-07-10 – 2024-07-13 (×10): 10 mL via ORAL
  Filled 2024-07-10 (×11): qty 10

## 2024-07-10 NOTE — Progress Notes (Signed)
 PHARMACY - ANTICOAGULATION CONSULT NOTE  Pharmacy Consult:  Heparin  Indication: atrial fibrillation  Allergies[1]  Patient Measurements: Height: 5' 10 (177.8 cm) Weight: 54.3 kg (119 lb 11.4 oz) IBW/kg (Calculated) : 73 HEPARIN  DW (KG): 55.3  Vital Signs: Temp: 97.7 F (36.5 C) (01/18 0714) Temp Source: Oral (01/18 0714) BP: 106/55 (01/18 0615) Pulse Rate: 100 (01/18 0615)  Labs: Recent Labs    07/08/24 0519 07/09/24 0445 07/09/24 1537 07/10/24 0447  HGB 9.2* 8.4*  --  9.3*  HCT 27.2* 25.4*  --  28.4*  PLT 190 150  --  172  APTT 97* 73*  --  76*  HEPARINUNFRC 0.75* 0.41  --  0.33  CREATININE 0.94 0.88 0.95 1.02    Estimated Creatinine Clearance: 49.5 mL/min (by C-G formula based on SCr of 1.02 mg/dL).   Assessment: 2 YOM with lung cancer transferred from [facility] to [facility] for thoracentesis.  Patient has a history of Afib on Eliquis  PTA.  Last Eliquis  dose documented on 1/13  AM per [facility] report upon transfer.  Pharmacy consulted to manage IV heparin .    aPTT remains therapeutic range. Heparin  level now in correlation with aPTT and therapeutic at 0.33. Will use Heparin  level going forward. CBC stable; no bleeding reported.  Goal of Therapy:  Heparin  level 0.3-0.7 units/ml aPTT 66-102 seconds Monitor platelets by anticoagulation protocol: Yes   Plan:  Cotninue IV heparin  550 units/hr Daily heparin  level and CBC Follow-up ability to transition back to oral therapy as able.   Harlene Boga, PharmD Please refer to [facility] for [facility] Pharmacy numbers 07/10/2024, 7:34 AM      [1]  Allergies Allergen Reactions   Flomax  [Tamsulosin  Hcl] Other (See Comments)    Made patient hypotensive, clammy, sweaty, and feel like he was going to pass out (EMS had to be called)   Crestor [Rosuvastatin Calcium ] Other (See Comments)    Lethargy and muscle aches   Lipitor [Atorvastatin Calcium ] Other (See Comments)    Body cramps, lethargy, and muscle aches

## 2024-07-10 NOTE — Progress Notes (Signed)
 "  NAME:  [name] [name], MRN:  [id number], DOB:  1950/07/09, LOS: 4 ADMISSION DATE:  07/06/2024, CONSULTATION DATE:  07/10/24 REFERRING MD:  MELODIE, CHIEF COMPLAINT:  SOB   History of Present Illness:  [name] [name] is a 74 y.o. male who has a PMH as below including but not limited to right NSCLC first diagnosed 04/25/24 after bx via bronchoscopy and currently undergoing chemo (Carboplatin/Paclitaxel weekly) and XRT under the care of Dr. Letty at [facility] (started 06/06/24), Emphysema, R effusion unknown if had thoracentesis 06/27/24 (no studies sent), A.fib on Apixaban .   He was admitted to [facility] R 07/04/24 with dyspnea. CTA was negative for PE but he  was found to have a right effusion and atlectasis. A right thora was planned but was aborted due to increased WOB and diaphragmatic excursion into the field of view causing limited safe window per notes in care everywhere. It was recommended that he be transferred to a tertiary center for further evaluation.   On 1/14, he was transferred to [facility]. He has been on NE for BP support after receiving Lasix  in the ED at [facility] R with resultant hypotension.  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   Significant Hospital Events: Including procedures, antibiotic start  and stop dates in addition to other pertinent events   1/12 admit to [facility] R 1/14 transfer to [facility] 1/17 second thoracentesis, drained 1.4 L  Interim History / Subjective:  Patient notes dyspnea has improved dramatically after thoracentesis. Afebrile, HR 90 - 100s, hemodynamics supported with vasopressors, SPO2 > 90% on RA Drips: Levophed  4 mcg/min I/O: -2.4 L (overall + 2.2 L) Objective    Blood pressure (!) 107/56, pulse (!) 102, temperature 97.7 F (36.5 C), temperature source Oral, resp. rate (!) 29, height 5' 10 (1.778 m), weight 54.3 kg, SpO2 95%.        Intake/Output Summary (Last 24 hours) at 07/10/2024 0907 Last data filed at 07/10/2024 0800 Gross per 24 hour  Intake 705.08 ml  Output 2775 ml  Net -2069.92 ml   Filed Weights   07/06/24 0941 07/09/24 0500 07/10/24 0500  Weight: 55.3 kg 59 kg 54.3 kg    Examination: General: well appearing, alert, no distress, tachypnea improved. HENT: anicteric sclera, well injected conjunctivae, oral and  Lungs: decreased breath sounds in right lower lung field, clear anteriorly Cardiovascular: Distant heart sounds, JVD at 8 cm H2O Abdomen: soft, non-tender Extremities: trace pitting edema Neuro: Alert, oriented x 3 GU: deferred  Resolved problem list   Assessment and Plan   #Shock: likely mixed distributive (septic) and cardiogenic shock state. Echo with EF reduced to 25% from previous 45%. However, coox is 70% and stable. He is warm and well perfused, I suspect distributive shock is the main contributor. - Wean levophed  as tolerated - Midodrine  10 mg TID - Antibiotics for pneumonia per below - Target MAP > 65 mmHg  #HFrEF: > Appears euvolemic to hypervolemic on examination. - Re-dose lasix  today. -  Holding GDMT given hypotension - May consider consulting cardiology if Coox downtrending  #Atrial Fibrillation: - Heparin  drip - Amiodarone  200 mg daily   #R malignant effusion: CXR shows re accumulation. 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 CXR looks improved, patient feels better. - Will consult IR for IPC such as  a PLEURX.  #CAP: - Azithromycin  500 mg x 3 days (ends 1/18) - Zosyn  (ends 1/21)  #Stress Ulcer PPx: PPI  #Nutrition: regular diet  #VTE ppx: heparin  drip  Disposition: ICU appropriate for vasopressors  Patient Lines/Drains/Airways Status     Active Line/Drains/Airways     Name Placement date Placement time Site Days   Implanted Port 07/06/24 Left Chest 07/06/24  0940  Chest  4   Wound 07/06/24 0950 Pressure Injury Coccyx Bilateral Stage 1 -  Intact skin with non-blanchable redness of a localized area usually over a bony prominence. 07/06/24  0950  Coccyx  4            Labs   CBC: Recent Labs  Lab 07/06/24 1909 07/07/24 0725 07/08/24 0519 07/09/24 0445 07/10/24 0447  WBC 3.6* 3.3* 3.7* 2.4* 2.9*  HGB 9.9* 10.1* 9.2* 8.4* 9.3*  HCT 30.1* 30.9* 27.2* 25.4* 28.4*  MCV 88.0 87.0 85.0 86.7 87.7  PLT 153 173 190 150 172    Basic Metabolic Panel: Recent Labs  Lab 07/07/24 0725 07/08/24 0519 07/09/24 0445 07/09/24 1537 07/10/24 0447  NA 134* 134* 137 135 134*  K 4.3 4.0 3.8 4.6 4.6  CL 98 100 102 98 99  CO2 29 25 28 28 28   GLUCOSE 143* 138* 116* 147* 117*  BUN 16 13 9 9 10   CREATININE 0.99 0.94 0.88 0.95 1.02  CALCIUM  7.7* 7.6* 7.6* 7.7* 8.0*  MG 2.6* 2.2 1.8 2.3 2.1  PHOS 2.1* 2.4* 2.5  --  2.2*   GFR: Estimated Creatinine Clearance: 49.5 mL/min (by C-G formula based on SCr of 1.02 mg/dL). Recent Labs  Lab 07/07/24 0725 07/08/24 0519 07/09/24 0445 07/10/24 0447  WBC 3.3* 3.7* 2.4* 2.9*    Liver Function Tests: Recent Labs  Lab 07/06/24 1147  PROT 6.4*   No results for input(s): LIPASE, AMYLASE in the last 168 hours. No results for input(s): AMMONIA in the last 168 hours.  ABG    Component Value Date/Time   HCO3  27.3 01/29/2024 1850   TCO2 27 05/30/2021 0810   O2SAT 70.5 07/09/2024 1100     Coagulation Profile: No results for input(s): INR, PROTIME in the last 168 hours.  Cardiac Enzymes: No results for input(s): CKTOTAL, CKMB, CKMBINDEX, TROPONINI in the last 168 hours.  HbA1C: HB A1C (BAYER DCA - WAIVED)  Date/Time Value Ref Range Status  01/07/2016 09:00 AM 5.9 <7.0 % Final    Comment:                                          Diabetic Adult            <7.0                                       Healthy Adult        4.3 - 5.7                                                           (  DCCT/NGSP) American Diabetes Association's Summary of Glycemic Recommendations for Adults with Diabetes: Hemoglobin A1c <7.0%. More stringent glycemic goals (A1c <6.0%) may further reduce complications at the cost of increased risk of hypoglycemia.    Hgb A1c MFr Bld  Date/Time Value Ref Range Status  10/02/2023 08:39 AM 6.1 (H) 4.8 - 5.6 % Final    Comment:             Prediabetes: 5.7 - 6.4          Diabetes: >6.4          Glycemic control for adults with diabetes: <7.0   07/13/2017 09:30 AM 5.9 (H) 4.8 - 5.6 % Final    Comment:             Prediabetes: 5.7 - 6.4          Diabetes: >6.4          Glycemic control for adults with diabetes: <7.0     CBG: Recent Labs  Lab 07/09/24 1140 07/09/24 1539 07/09/24 1924 07/09/24 2320 07/10/24 0713  GLUCAP 118* 114* 120* 107* 118*    Review of Systems:   Not obtained  Past Medical History:  He,  has a past medical history of Arthritis, Carotid artery stenosis without cerebral infarction, Chronic cholecystitis, Chronic constipation, COPD (chronic obstructive pulmonary disease) (HCC), Coronary artery disease (2002), Depression, DOE (dyspnea on exertion), Dyslipidemia, Essential hypertension, Full dentures, GERD (gastroesophageal reflux disease), History of adenomatous polyp of colon, History of COVID-19, History of diverticulitis of  colon, History of kidney stones, Hyperlipidemia, Hypothyroidism, Lung nodule, Osteoporosis, Smokers' cough (HCC), and Wears glasses.   Surgical History:   Past Surgical History:  Procedure Laterality Date   BRONCHIAL BIOPSY  04/25/2024   Procedure: BRONCHOSCOPY, WITH BIOPSY;  Surgeon: Shelah Lamar RAMAN, MD;  Location: [facility] ENDOSCOPY;  Service: Pulmonary;;   BRONCHIAL BRUSHINGS  04/25/2024   Procedure: BRONCHOSCOPY, WITH BRUSH BIOPSY;  Surgeon: Shelah Lamar RAMAN, MD;  Location: [facility] ENDOSCOPY;  Service: Pulmonary;;   BRONCHIAL NEEDLE ASPIRATION BIOPSY  04/25/2024   Procedure: BRONCHOSCOPY, WITH NEEDLE ASPIRATION BIOPSY;  Surgeon: Shelah Lamar RAMAN, MD;  Location: [facility] ENDOSCOPY;  Service: Pulmonary;;   BRONCHOSCOPY  2005   CARDIAC CATHETERIZATION  08/11/2000   @[facility]  by dr morris;   preserved LVF,  ostial RCA 70-75%, other nonobstructive disease   CARDIAC CATHETERIZATION  03/30/2008   @[facility]  by dr hochrein;  occluded RCA and other nonobstructive involving LAD, CFx, D1,OM1  and left to right collateral filling,  mild inferior hypokinesis,  ef 55%   CAROTID ENDARTERECTOMY Right 09/24/2007   @[facility]  by dr eliza   COLONOSCOPY  01/19/2017   by armsbruster   EXCISION NEUROMA Left 10/01/2005   @[facility] ;  left inguinal   EXTRACORPOREAL SHOCK WAVE LITHOTRIPSY  2015   INGUINAL HERNIA REPAIR Left    01-13-2003 and 07-22-2006 both @[facility]    LAPAROSCOPIC CHOLECYSTECTOMY SINGLE SITE WITH INTRAOPERATIVE CHOLANGIOGRAM N/A 05/30/2021   Procedure: LAPAROSCOPIC CHOLECYSTECTOMY SINGLE SITE WITH INTRAOPERATIVE CHOLANGIOGRAM;  Surgeon: Sheldon Standing, MD;  Location: [facility];  Service: General;  Laterality: N/A;   LAPAROSCOPIC INGUINAL HERNIA REPAIR Bilateral 04/28/2007   @[facility]    LEFT HEART CATH AND CORONARY ANGIOGRAPHY N/A 08/04/2017   Procedure: LEFT HEART CATH AND CORONARY ANGIOGRAPHY;  Surgeon: Verlin Lonni BIRCH, MD;  Location: [facility] INVASIVE CV LAB;  Service: Cardiovascular;  Laterality: N/A;   THROAT SURGERY   1997   pt stated removal of tumor that was benign   VIDEO BRONCHOSCOPY WITH ENDOBRONCHIAL ULTRASOUND Right  04/25/2024   Procedure: BRONCHOSCOPY, WITH EBUS;  Surgeon: Shelah Lamar RAMAN, MD;  Location: [facility] ENDOSCOPY;  Service: Pulmonary;  Laterality: Right;  Right hilar mass and suspected  lymph node involvement     Social History:   reports that he quit smoking about 5 months ago. His smoking use included cigarettes. He started smoking about 12 months ago. He has a 58.9 pack-year smoking history. He has never used smokeless tobacco. He reports that he does not currently use alcohol. He reports that he does not use drugs.   Family History:  His family history includes Alcohol abuse in his brother; Colon cancer (age of onset: 39) in his brother; Colon polyps in his brother; Healthy in his daughter, son, son, and son; Lung disease in his brother, brother, and father; Lupus in his brother and mother. There is no history of Esophageal cancer, Rectal cancer, Stomach cancer, Pancreatic cancer, Crohn's disease, or Ulcerative colitis.   Allergies Allergies[1]   Home Medications  Prior to Admission medications  Medication Sig Start Date End Date Taking? Authorizing Provider  albuterol  (PROVENTIL ) (2.5 MG/3ML) 0.083% nebulizer solution INHALE ONE vial by NEBULIZER EVERY 6 HOURS AS NEEDED FOR WHEEZING OR SHORTNESS OF BREATH 07/01/24  Yes Dettinger, [name], MD  albuterol  (VENTOLIN  HFA) 108 (90 Base) MCG/ACT inhaler Inhale 2 puffs into the lungs every 6 (six) hours as needed for wheezing or shortness of breath. 07/02/24  Yes [provider]  ALPRAZolam  (XANAX ) 0.5 MG tablet Take 0.5 mg by mouth at bedtime as needed for sleep or anxiety. 06/24/24  Yes [provider]  amiodarone  (PACERONE ) 200 MG tablet Take 400 mg by mouth 2 (two) times daily. Take 2 tablets (400 mg total) by mouth 2 (two) times a day with meals for 5 days, THEN 1 tablet (200 mg total) 2 (two) times a day with meals for 7 days, THEN  1 tablet (200 mg total) daily.   Yes [provider]  Bempedoic Acid -Ezetimibe  (NEXLIZET ) 180-10 MG TABS TAKE ONE TABLET DAILY 12/22/23  Yes Dettinger, [name], MD  bisoprolol  (ZEBETA ) 5 MG tablet Take 2.5 mg by mouth daily. 06/22/24  Yes [provider]  budesonide -glycopyrrolate -formoterol  (BREZTRI  AEROSPHERE) 160-9-4.8 MCG/ACT AERO inhaler Inhale 2 puffs into the lungs in the morning and at bedtime. 01/31/24  Yes Emokpae, Courage, MD  ELIQUIS  5 MG TABS tablet Take 5 mg by mouth 2 (two) times daily. 06/22/24  Yes [provider]  fluticasone  (FLONASE ) 50 MCG/ACT nasal spray Place 2 sprays into both nostrils daily. 08/10/23  Yes [facility] Morton Hummer, Nena, NP  HYDROcodone bit-homatropine (HYCODAN) 5-1.5 MG/5ML syrup Take 5 mLs by mouth 4 (four) times daily as needed for cough. 06/30/24  Yes [provider]  hydrOXYzine  (VISTARIL ) 25 MG capsule TAKE ONE CAPSULE EVERY 8 HOURS AS NEEDED 05/12/24  Yes Dettinger, [name], MD  levothyroxine  (SYNTHROID ) 50 MCG tablet Take 1 tablet (50 mcg total) by mouth daily before breakfast. 01/31/24  Yes Emokpae, Courage, MD  nitroGLYCERIN  (NITROSTAT ) 0.4 MG SL tablet Place 1 tablet (0.4 mg total) under the tongue every 5 (five) minutes as needed for chest pain. 02/26/24 05/30/97 Yes Miriam Norris, NP  omeprazole  (PRILOSEC) 20 MG capsule Take 1 capsule (20 mg total) by mouth daily. 01/31/24  Yes Pearlean Manus, MD  ondansetron  (ZOFRAN ) 8 MG tablet Take 8 mg by mouth every 8 (eight) hours as needed for vomiting. 06/06/24  Yes [provider]  prochlorperazine (COMPAZINE) 10 MG tablet Take 10 mg by mouth every 8 (  eight) hours as needed for nausea or vomiting. 06/06/24  Yes [provider]  traZODone  (DESYREL ) 100 MG tablet Take 1 tablet (100 mg total) by mouth at bedtime. 02/11/24  Yes Dettinger, [name], MD  cefdinir  (OMNICEF ) 300 MG capsule Take 300 mg by mouth 2 (two) times daily. Patient not taking: Reported on 07/07/2024  06/28/24   [provider]  doxycycline  (VIBRA -TABS) 100 MG tablet Take 100 mg by mouth 2 (two) times daily. Patient not taking: Reported on 07/07/2024 06/28/24   [provider]  OXYGEN  Inhale 2 L into the lungs daily.    [provider]  valsartan  (DIOVAN ) 80 MG tablet Take 80 mg by mouth daily. Patient not taking: Reported on 07/07/2024 05/13/24   [provider]     Due to a high probability of clinically significant, life threatening deterioration, the patient required my highest level of preparedness to intervene emergently and I personally spent this critical care time directly and personally managing the patient. This critical care time included obtaining a history; examining the patient; pulse oximetry; ordering and review of studies; arranging urgent treatment with development of a management plan; evaluation of patient's response to treatment; frequent reassessment; and, discussions with other providers.  This critical care time was performed to assess and manage the high probability of imminent, life-threatening deterioration that could result in multi-organ failure. It was exclusive of separately billable procedures and treating other patients and teaching time. Critical Care Time: 33 minutes.  Paula Southerly, MD [facility]              [1]  Allergies Allergen Reactions   Flomax  [Tamsulosin  Hcl] Other (See Comments)    Made patient hypotensive, clammy, sweaty, and feel like he was going to pass out (EMS had to be called)   Crestor [Rosuvastatin Calcium ] Other (See Comments)    Lethargy and muscle aches   Lipitor [Atorvastatin Calcium ] Other (See Comments)    Body cramps, lethargy, and muscle aches   "

## 2024-07-10 NOTE — Plan of Care (Signed)
" °  Problem: Education: Goal: Knowledge of General Education information will improve Description: Including pain rating scale, medication(s)/side effects and non-pharmacologic comfort measures Outcome: Progressing   Problem: Health Behavior/Discharge Planning: Goal: Ability to manage health-related needs will improve Outcome: Progressing   Problem: Clinical Measurements: Goal: Ability to maintain clinical measurements within normal limits will improve Outcome: Progressing Goal: Will remain free from infection Outcome: Progressing Goal: Diagnostic test results will improve Outcome: Progressing Goal: Respiratory complications will improve Outcome: Progressing Goal: Cardiovascular complication will be avoided Outcome: Progressing   Problem: Nutrition: Goal: Adequate nutrition will be maintained Outcome: Progressing   Problem: Coping: Goal: Level of anxiety will decrease Outcome: Progressing   Problem: Elimination: Goal: Will not experience complications related to bowel motility Outcome: Progressing Goal: Will not experience complications related to urinary retention Outcome: Progressing   Problem: Activity: Goal: Risk for activity intolerance will decrease Outcome: Progressing   Problem: Education: Goal: Knowledge of the prescribed therapeutic regimen will improve Outcome: Progressing   Problem: Activity: Goal: Ability to perform activities at highest level will improve Outcome: Progressing   Problem: Bowel/Gastric: Goal: Occurrences of nausea will decrease Outcome: Progressing Goal: Bowel function will improve Outcome: Progressing   Problem: Skin Integrity: Goal: Status of oral mucous membranes will improve Outcome: Progressing   Problem: Education: Goal: Knowledge of the prescribed therapy will improve Outcome: Progressing   "

## 2024-07-10 NOTE — Progress Notes (Signed)
 Pharmacy Electrolyte Replacement  Recent Labs:  Recent Labs    07/10/24 0447  K 4.6  MG 2.1  PHOS 2.2*  CREATININE 1.02    Low Critical Values (K </= 2.5, Phos </= 1, Mg </= 1) Present: None  MD Contacted: N/a  Plan: K Phos  Neutral 500 mg oral every 4 hours x 3 doses.   Harlene Boga, PharmD Please refer to [facility] for [facility] numbers 07/10/2024, 2:39 PM

## 2024-07-10 NOTE — Consult Note (Signed)
 "     Chief Complaint: Patient was seen in consultation today for recurrent malignant right pleural effusion at the request of Alghanim, Fahid (PCCM)  Referring Physician(s): Alghanim, Agricultural Consultant (PCCM)  Supervising Physician: Jennefer Rover  Patient Status: [facility] - In-pt  History of Present Illness: [name] [name] is a 74 y.o. male with PMHs of HTN, CAD, carotid A stenosis/occlusion s/p right CEA in 2009, right NSCLC first diagnosed 04/25/24, and recurrent malignant pleural effusion, IR was consulted for pleural PleurX catheter placement.   Patient was diagnosed with right NSCLC on 04/25/24 via bronch wth bx, started chemoradiation therapy under the care of Dr. Letty at [facility].Patient was admitted to [facility] on 07/04/24 due to dyspnea, CTA chest showed large right pleural effusion.   Patient was transferred to [facility] on 1/14 for hight level of care, underwent right thoracentesis with PCCM on 1/14 which yielded 1 L of pleural effusion. Cytology showed malignant cells. CXR on 1/16 showed increasing right pleural effusion, patient underwent right thoracentesis with PCCM again on 1/17 which yielded 1.4 L of pleural effusion. IR was consulted for right tunneled pleural catheter (PleurX) placement, care reviewed and approved by Dr. Jennefer.   Patient sitting in bed, daughter, son, and family friend at bedside. He is not in acute distress but breathing appears labored.  States that his breathing could be better.  Denise headache, fever, chills, cough, chest pain, abdominal pain, nausea ,vomiting, and bleeding. Family also raised concern of bilateral arm and facial swelling. Patient does have edema, PCCM notified.   Family states that they will be able to help the patient with PleurX.  At this time, it is not clear which service will manage the PleurX as outpatient. Will discuss with his [facility] oncology team.   Informed the patient and his family that IR will need to make sure his oncologist at [facility] agrees  with the PleurX placement as patient was receiving chemoradiation therapy, sometimes oncology recommend against the PleurX placement due to increased infection risk. They verbalized understanding, PleurX placement tentatively scheduled for Tuesday.   Patient currently has DNR order in place. Discussion with the patient and family regarding wishes.  The DNR order is modified during the procedure such that limited attempts at resuscitation are clearly defined with regards to specific procedures.   Patient consents for CPR during the procedure, but DOES NOT consent for intubation.  Patient also consents for chest tube placement if PTX caused during the PleurX placement.   Past Medical History:  Diagnosis Date   Arthritis    Carotid artery stenosis without cerebral infarction    vascular --- dr eliza;   09-24-2007  s/p right CEA   Chronic cholecystitis    w/  large calcified stone in gallbladder   Chronic constipation    COPD (chronic obstructive pulmonary disease) (HCC)    followed by pcp----  (05-28-2021  pt stated last used rescue 05-27-2021 for wheezing resolved after use, stated exacerbation approx  greater than a year ago)   Coronary artery disease 2002   cardiologist-- dr lavona ;  (05-28-2021  pt denies need to use nitro for CP) per last cath 08-04-2017  LM luminal irregularities, LAD 30% ,D1 ostial 35% ,CFx   AV groove ostial 25% , OM1 long prox 30%,  proxRCA occluded and dominant,   excellent left to right collateral filling.   Depression    Patient denies   DOE (dyspnea on exertion)    due to smoking/ copd   Dyslipidemia  Essential hypertension    Full dentures    GERD (gastroesophageal reflux disease)    History of adenomatous polyp of colon    History of COVID-19    per pt winter 2022, mild to moderate symptoms that resolved   History of diverticulitis of colon    History of kidney stones    Hyperlipidemia    Hypothyroidism    followed by pcp   Lung nodule    s/p  bronchoscopy w/ bx in 2005,  benign   Osteoporosis    Smokers' cough (HCC)    05-28-2021  pt stated occasionally productive w/ white phlegm   Wears glasses     Past Surgical History:  Procedure Laterality Date   BRONCHIAL BIOPSY  04/25/2024   Procedure: BRONCHOSCOPY, WITH BIOPSY;  Surgeon: Shelah Lamar RAMAN, MD;  Location: [facility] ENDOSCOPY;  Service: Pulmonary;;   BRONCHIAL BRUSHINGS  04/25/2024   Procedure: BRONCHOSCOPY, WITH BRUSH BIOPSY;  Surgeon: Shelah Lamar RAMAN, MD;  Location: [facility] ENDOSCOPY;  Service: Pulmonary;;   BRONCHIAL NEEDLE ASPIRATION BIOPSY  04/25/2024   Procedure: BRONCHOSCOPY, WITH NEEDLE ASPIRATION BIOPSY;  Surgeon: Shelah Lamar RAMAN, MD;  Location: [facility] ENDOSCOPY;  Service: Pulmonary;;   BRONCHOSCOPY  2005   CARDIAC CATHETERIZATION  08/11/2000   @[facility]  by dr morris;   preserved LVF,  ostial RCA 70-75%, other nonobstructive disease   CARDIAC CATHETERIZATION  03/30/2008   @[facility]  by dr hochrein;  occluded RCA and other nonobstructive involving LAD, CFx, D1,OM1  and left to right collateral filling,  mild inferior hypokinesis,  ef 55%   CAROTID ENDARTERECTOMY Right 09/24/2007   @[facility]  by dr eliza   COLONOSCOPY  01/19/2017   by armsbruster   EXCISION NEUROMA Left 10/01/2005   @[facility] ;  left inguinal   EXTRACORPOREAL SHOCK WAVE LITHOTRIPSY  2015   INGUINAL HERNIA REPAIR Left    01-13-2003 and 07-22-2006 both @[facility]    LAPAROSCOPIC CHOLECYSTECTOMY SINGLE SITE WITH INTRAOPERATIVE CHOLANGIOGRAM N/A 05/30/2021   Procedure: LAPAROSCOPIC CHOLECYSTECTOMY SINGLE SITE WITH INTRAOPERATIVE CHOLANGIOGRAM;  Surgeon: Sheldon Standing, MD;  Location: [facility];  Service: General;  Laterality: N/A;   LAPAROSCOPIC INGUINAL HERNIA REPAIR Bilateral 04/28/2007   @[facility]    LEFT HEART CATH AND CORONARY ANGIOGRAPHY N/A 08/04/2017   Procedure: LEFT HEART CATH AND CORONARY ANGIOGRAPHY;  Surgeon: Verlin Lonni BIRCH, MD;  Location: [facility] INVASIVE CV LAB;  Service: Cardiovascular;  Laterality: N/A;   THROAT  SURGERY  1997   pt stated removal of tumor that was benign   VIDEO BRONCHOSCOPY WITH ENDOBRONCHIAL ULTRASOUND Right 04/25/2024   Procedure: BRONCHOSCOPY, WITH EBUS;  Surgeon: Shelah Lamar RAMAN, MD;  Location: [facility]COPY;  Service: Pulmonary;  Laterality: Right;  Right hilar mass and suspected  lymph node involvement    Allergies: Flomax  [tamsulosin  hcl], Crestor [rosuvastatin calcium ], and Lipitor [atorvastatin calcium ]  Medications: Prior to Admission medications  Medication Sig Start Date End Date Taking? Authorizing Provider  albuterol  (PROVENTIL ) (2.5 MG/3ML) 0.083% nebulizer solution INHALE ONE vial by NEBULIZER EVERY 6 HOURS AS NEEDED FOR WHEEZING OR SHORTNESS OF BREATH 07/01/24  Yes Dettinger, [name], MD  albuterol  (VENTOLIN  HFA) 108 (90 Base) MCG/ACT inhaler Inhale 2 puffs into the lungs every 6 (six) hours as needed for wheezing or shortness of breath. 07/02/24  Yes [provider]  ALPRAZolam  (XANAX ) 0.5 MG tablet Take 0.5 mg by mouth at bedtime as needed for sleep or anxiety. 06/24/24  Yes [provider]  amiodarone  (PACERONE ) 200 MG tablet Take 400 mg by mouth 2 (two) times daily. Take  2 tablets (400 mg total) by mouth 2 (two) times a day with meals for 5 days, THEN 1 tablet (200 mg total) 2 (two) times a day with meals for 7 days, THEN 1 tablet (200 mg total) daily.   Yes [provider]  Bempedoic Acid -Ezetimibe  (NEXLIZET ) 180-10 MG TABS TAKE ONE TABLET DAILY 12/22/23  Yes Dettinger, [name], MD  bisoprolol  (ZEBETA ) 5 MG tablet Take 2.5 mg by mouth daily. 06/22/24  Yes [provider]  budesonide -glycopyrrolate -formoterol  (BREZTRI  AEROSPHERE) 160-9-4.8 MCG/ACT AERO inhaler Inhale 2 puffs into the lungs in the morning and at bedtime. 01/31/24  Yes Emokpae, Courage, MD  ELIQUIS  5 MG TABS tablet Take 5 mg by mouth 2 (two) times daily. 06/22/24  Yes [provider]  fluticasone  (FLONASE ) 50 MCG/ACT nasal spray Place 2 sprays into both nostrils  daily. 08/10/23  Yes [facility] Morton Hummer, Nena, NP  HYDROcodone bit-homatropine (HYCODAN) 5-1.5 MG/5ML syrup Take 5 mLs by mouth 4 (four) times daily as needed for cough. 06/30/24  Yes [provider]  hydrOXYzine  (VISTARIL ) 25 MG capsule TAKE ONE CAPSULE EVERY 8 HOURS AS NEEDED 05/12/24  Yes Dettinger, [name], MD  levothyroxine  (SYNTHROID ) 50 MCG tablet Take 1 tablet (50 mcg total) by mouth daily before breakfast. 01/31/24  Yes Emokpae, Courage, MD  nitroGLYCERIN  (NITROSTAT ) 0.4 MG SL tablet Place 1 tablet (0.4 mg total) under the tongue every 5 (five) minutes as needed for chest pain. 02/26/24 05/30/97 Yes Miriam Norris, NP  omeprazole  (PRILOSEC) 20 MG capsule Take 1 capsule (20 mg total) by mouth daily. 01/31/24  Yes Pearlean Manus, MD  ondansetron  (ZOFRAN ) 8 MG tablet Take 8 mg by mouth every 8 (eight) hours as needed for vomiting. 06/06/24  Yes [provider]  prochlorperazine (COMPAZINE) 10 MG tablet Take 10 mg by mouth every 8 (eight) hours as needed for nausea or vomiting. 06/06/24  Yes [provider]  traZODone  (DESYREL ) 100 MG tablet Take 1 tablet (100 mg total) by mouth at bedtime. 02/11/24  Yes Dettinger, [name], MD  cefdinir  (OMNICEF ) 300 MG capsule Take 300 mg by mouth 2 (two) times daily. Patient not taking: Reported on 07/07/2024 06/28/24   [provider]  doxycycline  (VIBRA -TABS) 100 MG tablet Take 100 mg by mouth 2 (two) times daily. Patient not taking: Reported on 07/07/2024 06/28/24   [provider]  OXYGEN  Inhale 2 L into the lungs daily.    [provider]  valsartan  (DIOVAN ) 80 MG tablet Take 80 mg by mouth daily. Patient not taking: Reported on 07/07/2024 05/13/24   [provider]     Family History  Problem Relation Age of Onset   Lupus Mother    Lung disease Father    Lupus Brother    Alcohol abuse Brother    Lung disease Brother    Colon polyps Brother    Colon cancer Brother 3   Lung disease Brother     Healthy Daughter    Healthy Son    Healthy Son    Healthy Son    Esophageal cancer Neg Hx    Rectal cancer Neg Hx    Stomach cancer Neg Hx    Pancreatic cancer Neg Hx    Crohn's disease Neg Hx    Ulcerative colitis Neg Hx     Social History   Socioeconomic History   Marital status: Married    Spouse name: [name]   Number of children: 4   Years of education: high school diploma   Highest  education level: 12th grade  Occupational History   Occupation: truck Air Traffic Controller: [employer]  Tobacco Use   Smoking status: Former    Current packs/day: 0.00    Average packs/day: 1 pack/day for 58.6 years (58.9 ttl pk-yrs)    Types: Cigarettes    Start date: 2025    Quit date: 01/29/2024    Years since quitting: 0.4   Smokeless tobacco: Never   Tobacco comments:    Quit smoking mid September 2025  Vaping Use   Vaping status: Never Used  Substance and Sexual Activity   Alcohol use: Not Currently   Drug use: No   Sexual activity: Yes    Birth control/protection: None  Other Topics Concern   Not on file  Social History Narrative   Lives with wife in a split level home.    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   Social Drivers of Health   Tobacco Use: Medium Risk (07/07/2024)   Patient History    Smoking Tobacco Use: Former    Smokeless Tobacco Use: Never    Passive Exposure: Not on file  Financial Resource Strain: Low Risk (06/26/2024)   Received from [facility]   Overall Financial Resource Strain (CARDIA)    How hard is it for you to pay for the very basics like food, housing, medical care, and heating?: Not hard at all  Food Insecurity: No Food Insecurity (07/06/2024)   Epic    Worried About Programme Researcher, Broadcasting/film/video in the Last Year: Never true    Ran Out of Food in the Last Year: Never true  Transportation  Needs: No Transportation Needs (07/06/2024)   Epic    Lack of Transportation (Medical): No    Lack of Transportation (Non-Medical): No  Physical Activity: Inactive (05/02/2024)   Received from [facility]   Exercise Vital Sign    On average, how many days per week do you engage in moderate to strenuous exercise (like a brisk walk)?: 0 days    On average, how many minutes do you engage in exercise at this level?: 0 min  Stress: No Stress Concern Present (05/02/2024)   Received from [facility] Occup[facility] - Occupational Stress Questionnaire    Do you feel stress - tense, restless, nervous, or anxious, or unable to sleep at night because your mind is troubled all the time - these days?: Not at all  Social Connections: Socially Integrated (07/06/2024)   Social Connection and Isolation Panel    Frequency of Communication with Friends and Family: More than three times a week    Frequency of Social Gatherings with Friends and Family: Three times a week    Attends Religious Services: More than 4 times per year    Active Member of Clubs or Organizations: Yes    Attends Banker Meetings: More than 4 times per year    Marital Status: Married  Depression (PHQ2-9): Low Risk (06/29/2024)   Depression (PHQ2-9)    PHQ-2 Score: 0  Alcohol Screen: Low Risk (04/01/2023)   Alcohol Screen    Last Alcohol Screening Score (AUDIT): 0  Housing: Low Risk (07/06/2024)   Epic    Unable to Pay for Housing in the Last Year: No  Number of Times Moved in the Last Year: 0    Homeless in the Last Year: No  Utilities: Not At Risk (07/06/2024)   Epic    Threatened with loss of utilities: No  Health Literacy: Low Risk (05/02/2024)   Received from [facility] Literacy    How often do you need to have someone help you when you read instructions, pamphlets, or other written material from your doctor or pharmacy?: Never     Review of Systems: A 12 point  ROS discussed and pertinent positives are indicated in the HPI above.  All other systems are negative.  Vital Signs: BP (!) 107/56 (BP Location: Right Arm)   Pulse (!) 102   Temp 97.7 F (36.5 C) (Oral)   Resp (!) 29   Ht 5' 10 (1.778 m)   Wt 119 lb 11.4 oz (54.3 kg)   SpO2 95%   BMI 17.18 kg/m    Physical Exam Vitals reviewed.  Constitutional:      General: He is not in acute distress.    Appearance: He is not ill-appearing.  HENT:     Head: Normocephalic and atraumatic.     Mouth/Throat:     Mouth: Mucous membranes are moist.     Pharynx: Oropharynx is clear.  Neck:     Comments: + port in the left upper chest  Cardiovascular:     Rate and Rhythm: Normal rate and regular rhythm.  Pulmonary:     Breath sounds: Normal breath sounds.     Comments: Breathing slightly labored  Abdominal:     General: Abdomen is flat. Bowel sounds are normal.     Palpations: Abdomen is soft.  Musculoskeletal:        General: Swelling present.     Cervical back: Neck supple.     Comments: Bilateral arm edema, facial edema around the jaw.  Negative for LE edema   Skin:    General: Skin is warm and dry.     Coloration: Skin is not jaundiced or pale.  Neurological:     Mental Status: He is alert and oriented to person, place, and time.  Psychiatric:        Mood and Affect: Mood normal.        Behavior: Behavior normal.        Judgment: Judgment normal.        Imaging: DG CHEST PORT 1 VIEW Result Date: 07/09/2024 CLINICAL DATA:  Status post thoracentesis. EXAM: PORTABLE CHEST 1 VIEW COMPARISON:  07/08/2024 FINDINGS: Interval evacuation of the right-sided pleural effusion with a very small residual effusion and overall much improved aeration of the right lung with resolution atelectasis. Chronic underlying pulmonary scarring changes. No postprocedural pneumothorax. The cardiac silhouette, mediastinal and hilar contours are stable. The left IJ Port-A-Cath is stable. IMPRESSION: Interval  evacuation of right-sided pleural effusion with a very small residual effusion and overall much improved aeration of the right lung. No postprocedural pneumothorax. Electronically Signed   By: MYRTIS Stammer M.D.   On: 07/09/2024 16:18   ECHOCARDIOGRAM COMPLETE Result Date: 07/08/2024    ECHOCARDIOGRAM REPORT   Patient Name:   [name] [name] Date of Exam: 07/08/2024 Medical Rec #:  [id number]          Height:       70.0 in Accession #:    [id number]         Weight:       121.9 lb Date of Birth:  1951-02-06  BSA:          1.691 m Patient Age:    73 years           BP:           81/48 mmHg Patient Gender: M                  HR:           34 bpm. Exam Location:  Inpatient Procedure: 2D Echo, Cardiac Doppler, Color Doppler and Intracardiac            Opacification Agent (Both Spectral and Color Flow Doppler were            utilized during procedure). Indications:    Shock R57.9  History:        Patient has prior history of Echocardiogram examinations, most                 recent 05/13/2024. CAD; Risk Factors:Hypertension.  Sonographer:    Sydnee Wilson RDCS Referring Phys: [id number] PAULA SOUTHERLY IMPRESSIONS  1. Left ventricular ejection fraction, by estimation, is 25%. The left ventricle has severely decreased function. The left ventricle demonstrates global hypokinesis. Indeterminate diastolic filling due to E-A fusion.  2. Right ventricular systolic function is normal. The right ventricular size is normal.  3. The mitral valve is normal in structure. Mild mitral valve regurgitation.  4. AV is not visualized . No signficant gradient through the valve. Aortic valve regurgitation is not visualized.  5. The inferior vena cava is normal in size with greater than 50% respiratory variability, suggesting right atrial pressure of 3 mmHg.  6. Poor acoustic windows limit study. FINDINGS  Left Ventricle: Left ventricular ejection fraction, by estimation, is 25%. The left ventricle has severely decreased function. The  left ventricle demonstrates global hypokinesis. Definity  contrast agent was given IV to delineate the left ventricular endocardial borders. Indeterminate diastolic filling due to E-A fusion. Right Ventricle: The right ventricular size is normal. Right vetricular wall thickness was not assessed. Right ventricular systolic function is normal. Left Atrium: Left atrial size was normal in size. Right Atrium: Right atrial size was normal in size. Pericardium: There is no evidence of pericardial effusion. Mitral Valve: The mitral valve is normal in structure. Mild mitral valve regurgitation. Tricuspid Valve: The tricuspid valve is normal in structure. Tricuspid valve regurgitation is trivial. Aortic Valve: AV is not visualized . No signficant gradient through the valve. The aortic valve was not well visualized. Aortic valve regurgitation is not visualized. Aortic valve mean gradient measures 2.0 mmHg. Aortic valve peak gradient measures 4.2 mmHg. Aortic valve area, by VTI measures 2.38 cm. Aorta: The aortic root is normal in size and structure. Venous: The inferior vena cava is normal in size with greater than 50% respiratory variability, suggesting right atrial pressure of 3 mmHg. IAS/Shunts: No atrial level shunt detected by color flow Doppler.  LEFT VENTRICLE PLAX 2D LVOT diam:     2.01 cm      Diastology LV SV:         47           LV e' medial:    14.80 cm/s LV SV Index:   28           LV E/e' medial:  7.8 LVOT Area:     3.17 cm     LV e' lateral:   16.60 cm/s  LV E/e' lateral: 6.9  LV Volumes (MOD) LV vol d, MOD A2C: 90.4 ml LV vol d, MOD A4C: 148.0 ml LV vol s, MOD A2C: 56.0 ml LV vol s, MOD A4C: 96.1 ml LV SV MOD A2C:     34.4 ml LV SV MOD A4C:     148.0 ml LV SV MOD BP:      43.8 ml RIGHT VENTRICLE RV S prime:     12.60 cm/s TAPSE (M-mode): 1.6 cm LEFT ATRIUM             Index        RIGHT ATRIUM          Index LA diam:        3.47 cm 2.05 cm/m   RA Area:     8.31 cm LA Vol (A2C):    28.5 ml 16.85 ml/m  RA Volume:   17.60 ml 10.41 ml/m LA Vol (A4C):   31.2 ml 18.45 ml/m LA Biplane Vol: 32.5 ml 19.22 ml/m  AORTIC VALVE AV Area (Vmax):    2.53 cm AV Area (Vmean):   3.12 cm AV Area (VTI):     2.38 cm AV Vmax:           102.00 cm/s AV Vmean:          57.300 cm/s AV VTI:            0.197 m AV Peak Grad:      4.2 mmHg AV Mean Grad:      2.0 mmHg LVOT Vmax:         81.40 cm/s LVOT Vmean:        56.400 cm/s LVOT VTI:          0.148 m LVOT/AV VTI ratio: 0.75  AORTA Ao Root diam: 2.74 cm MITRAL VALVE MV Area (PHT): 5.02 cm     SHUNTS MV Decel Time: 151 msec     Systemic VTI:  0.15 m MV E velocity: 115.00 cm/s  Systemic Diam: 2.01 cm MV A velocity: 42.40 cm/s MV E/A ratio:  2.71 Vina Gull MD Electronically signed by Vina Gull MD Signature Date/Time: 07/08/2024/4:47:16 PM    Final    DG CHEST PORT 1 VIEW Result Date: 07/08/2024 EXAM: 1 VIEW(S) XRAY OF THE CHEST 07/08/2024 05:42:39 AM COMPARISON: Portable chest 07/06/2024 at 12:30 pm. CLINICAL HISTORY: 33498 Respiratory failure (HCC) 33498 Respiratory failure (HCC). FINDINGS: LINES, TUBES AND DEVICES: Left IJ (internal jugular) port catheter terminates at the superior cavoatrial junction. LUNGS AND PLEURA: Emphysematous and scarring changes of the lungs are noted. Increasing moderate right pleural effusion. Worsening penile Right perihilar airspace disease. Left lung remains clear of infiltrates. No pneumothorax. HEART AND MEDIASTINUM: There is aortic atherosclerosis with a stable mediastinum. The cardiac size is normal. BONES AND SOFT TISSUES: No acute osseous abnormality. IMPRESSION: 1. Increasing moderate right pleural effusion and right perihilar airspace disease. 2. Emphysematous and scarring changes of the lungs. 3. Left lung remains clear of infiltrates. Electronically signed by: Francis Quam MD 07/08/2024 06:48 AM EST RP Workstation: [id number]   DG CHEST PORT 1 VIEW Result Date: 07/06/2024 CLINICAL DATA:  Status post thoracentesis  EXAM: PORTABLE CHEST 1 VIEW COMPARISON:  April 05, 2024 FINDINGS: Stable cardiomediastinal silhouette. Interval placement of left internal jugular Port-A-Cath with distal tip in expected position of cavoatrial junction. Scarring is noted throughout both lungs. Minimal right pleural effusion is noted. No definite pneumothorax. Increased opacity seen laterally in right mid lung which may represent pneumonia. IMPRESSION: Minimal right  pleural effusion is noted. No definite pneumothorax is noted. Increased opacity seen laterally in right midlung which may represent pneumonia. Electronically Signed   By: Lynwood Landy Raddle M.D.   On: 07/06/2024 13:04    Labs:  CBC: Recent Labs    07/07/24 0725 07/08/24 0519 07/09/24 0445 07/10/24 0447  WBC 3.3* 3.7* 2.4* 2.9*  HGB 10.1* 9.2* 8.4* 9.3*  HCT 30.9* 27.2* 25.4* 28.4*  PLT 173 190 150 172    COAGS: Recent Labs    07/07/24 0732 07/08/24 0519 07/09/24 0445 07/10/24 0447  APTT 100* 97* 73* 76*    BMP: Recent Labs    07/08/24 0519 07/09/24 0445 07/09/24 1537 07/10/24 0447  NA 134* 137 135 134*  K 4.0 3.8 4.6 4.6  CL 100 102 98 99  CO2 25 28 28 28   GLUCOSE 138* 116* 147* 117*  BUN 13 9 9 10   CALCIUM  7.6* 7.6* 7.7* 8.0*  CREATININE 0.94 0.88 0.95 1.02  GFRNONAA >60 >60 >60 >60    LIVER FUNCTION TESTS: Recent Labs    10/02/23 0839 10/08/23 1425 12/31/23 1029 07/06/24 1147  BILITOT 0.5 0.4 0.7  --   AST 19 20 15   --   ALT 14 12 10   --   ALKPHOS 113 105 84  --   PROT 6.9 6.6 6.6 6.4*  ALBUMIN  4.3 4.1 3.3*  --     TUMOR MARKERS: No results for input(s): AFPTM, CEA, CA199, CHROMGRNA in the last 8760 hours.  Assessment and Plan: 74 y.o. male with right lung CA and recurrent malignant right pleural effusion, who is in need of tunneled pleural catheter placement.  VS soft,  on Levophed  4 mcg/min and midodrine  10 mg TID  CBC stable  AC/AP: on heparin  infusion - will discuss with performing IR radiologist to see how  long it needs to be held  Allergies reviewed  Abx 2g ancef  to be given during the procedure -NOT ORDERED YET   Risks and benefits of tunneled pleural/peritoneal catheter placement were discussed with the patient including, but not limited to bleeding, infection, or catheter malfunction and need for removal or additional procedures.  All of the patient's questions were answered, patient is agreeable to proceed. Consent signed and in chart.  Tentatively scheduled for Tuesday pending discussion with oncologist Dr. Letty at [facility]. IR will reach out to his team on Monday.  At this time, it is not clear which service will manage the PleurX as outpatient. Will discuss with his [facility] [facility] oncology team.   PLAN - NPO except sips and meds on Tuesday 0001 hrs - 2 g ancef  will be ordered on the day of the procedure   Thank you for this interesting consult.  I greatly enjoyed meeting [name] [name] and look forward to participating in their care.  A copy of this report was sent to the requesting provider on this date.  Electronically Signed: Toya VEAR Cousin, PA-C 07/10/2024, 9:45 AM   I spent a total of 40 Minutes    in face to face in clinical consultation, greater than 50% of which was counseling/coordinating care for right recurrent malignant pleural effusion.   This chart was dictated using voice recognition software.  Despite best efforts to proofread,  errors can occur which can change the documentation meaning.   "

## 2024-07-11 DIAGNOSIS — J9611 Chronic respiratory failure with hypoxia: Secondary | ICD-10-CM | POA: Diagnosis not present

## 2024-07-11 DIAGNOSIS — R579 Shock, unspecified: Secondary | ICD-10-CM | POA: Diagnosis not present

## 2024-07-11 DIAGNOSIS — I4891 Unspecified atrial fibrillation: Secondary | ICD-10-CM | POA: Diagnosis not present

## 2024-07-11 DIAGNOSIS — J449 Chronic obstructive pulmonary disease, unspecified: Secondary | ICD-10-CM | POA: Diagnosis not present

## 2024-07-11 LAB — BASIC METABOLIC PANEL WITH GFR
Anion gap: 7 (ref 5–15)
BUN: 12 mg/dL (ref 8–23)
CO2: 32 mmol/L (ref 22–32)
Calcium: 7.9 mg/dL — ABNORMAL LOW (ref 8.9–10.3)
Chloride: 96 mmol/L — ABNORMAL LOW (ref 98–111)
Creatinine, Ser: 1.16 mg/dL (ref 0.61–1.24)
GFR, Estimated: >60 mL/min
Glucose, Bld: 99 mg/dL (ref 70–99)
Potassium: 3.5 mmol/L (ref 3.5–5.1)
Sodium: 135 mmol/L (ref 135–145)

## 2024-07-11 LAB — PROTIME-INR
INR: 1 (ref 0.8–1.2)
Prothrombin Time: 13.5 s (ref 11.4–15.2)

## 2024-07-11 LAB — CBC
HCT: 32.2 % — ABNORMAL LOW (ref 39.0–52.0)
Hemoglobin: 10.5 g/dL — ABNORMAL LOW (ref 13.0–17.0)
MCH: 28.5 pg (ref 26.0–34.0)
MCHC: 32.6 g/dL (ref 30.0–36.0)
MCV: 87.3 fL (ref 80.0–100.0)
Platelets: 161 K/uL (ref 150–400)
RBC: 3.69 MIL/uL — ABNORMAL LOW (ref 4.22–5.81)
RDW: 15.9 % — ABNORMAL HIGH (ref 11.5–15.5)
WBC: 2.2 K/uL — ABNORMAL LOW (ref 4.0–10.5)
nRBC: 0 % (ref 0.0–0.2)

## 2024-07-11 LAB — MAGNESIUM: Magnesium: 1.8 mg/dL (ref 1.7–2.4)

## 2024-07-11 LAB — HEPARIN LEVEL (UNFRACTIONATED): Heparin Unfractionated: 0.3 [IU]/mL (ref 0.30–0.70)

## 2024-07-11 LAB — GLUCOSE, CAPILLARY: Glucose-Capillary: 124 mg/dL — ABNORMAL HIGH (ref 70–99)

## 2024-07-11 LAB — PHOSPHORUS: Phosphorus: 4.2 mg/dL (ref 2.5–4.6)

## 2024-07-11 MED ORDER — MAGNESIUM SULFATE 2 GM/50ML IV SOLN
2.0000 g | Freq: Once | INTRAVENOUS | Status: AC
  Administered 2024-07-11: 2 g via INTRAVENOUS
  Filled 2024-07-11: qty 50

## 2024-07-11 MED ORDER — POTASSIUM CHLORIDE CRYS ER 20 MEQ PO TBCR
40.0000 meq | EXTENDED_RELEASE_TABLET | Freq: Once | ORAL | Status: AC
  Administered 2024-07-11: 40 meq via ORAL
  Filled 2024-07-11: qty 2

## 2024-07-11 MED ORDER — MIDODRINE HCL 5 MG PO TABS
10.0000 mg | ORAL_TABLET | Freq: Three times a day (TID) | ORAL | Status: DC
  Administered 2024-07-11 – 2024-07-13 (×8): 10 mg via ORAL
  Filled 2024-07-11 (×8): qty 2

## 2024-07-11 NOTE — Progress Notes (Signed)
 "  NAME:  [name] [name], MRN:  [id number], DOB:  Sep 19, 1950, LOS: 5 ADMISSION DATE:  07/06/2024, CONSULTATION DATE:  07/11/24 REFERRING MD:  MELODIE, CHIEF COMPLAINT:  SOB   History of Present Illness:  [name] [name] is a 74 y.o. male who has a PMH as below including but not limited to right NSCLC first diagnosed 04/25/24 after bx via bronchoscopy and currently undergoing chemo (Carboplatin/Paclitaxel weekly) and XRT under the care of Dr. Letty at [facility] (started 06/06/24), Emphysema, R effusion unknown if had thoracentesis 06/27/24 (no studies sent), A.fib on Apixaban .   He was admitted to [facility] R 07/04/24 with dyspnea. CTA was negative for PE but he  was found to have a right effusion and atlectasis. A right thora was planned but was aborted due to increased WOB and diaphragmatic excursion into the field of view causing limited safe window per notes in care everywhere. It was recommended that he be transferred to a tertiary center for further evaluation.   On 1/14, he was transferred to [facility]. He has been on NE for BP support after receiving Lasix  in the ED at [facility] R with resultant hypotension.  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   Significant Hospital Events: Including procedures, antibiotic start  and stop dates in addition to other pertinent events   1/12 admit to [facility] R 1/14 transfer to [facility] 1/17 second thoracentesis, drained 1.4 L 1/8 started midodrine , levophed  is off  Interim History / Subjective:  SOB this AM Afebrile, HR 90 - 100s, hemodynamics sustained off pressors, SPO2 > 90% on 2 L O2 by [place] Drips: none I/O: -1.3 L (overall + 0.8L) Objective    Blood pressure (!) 104/51, pulse 96, temperature 97.7 F (36.5 C), temperature source Oral, resp. rate 19, height 5' 10 (1.778 m), weight 55.7 kg, SpO2 97%.    FiO2 (%):  [28 %] 28 %   Intake/Output Summary (Last 24 hours) at 07/11/2024 9076 Last data filed at 07/11/2024 0700 Gross per 24 hour  Intake 556.1 ml  Output 1450 ml  Net -893.9 ml   Filed Weights   07/09/24 0500 07/10/24 0500 07/11/24 0413  Weight: 59 kg 54.3 kg 55.7 kg    Examination: General: well appearing, slightly tachypneic HENT: anicteric sclera, well injected conjunctivae, oral and  Lungs: decreased breath sounds in right lung base Cardiovascular: Distant heart sounds, JVD is flat Abdomen: soft, non-tender Extremities: no edema Neuro: Alert, oriented x 3 GU: deferred  Resolved problem list   Assessment and Plan   #Shock: likely mixed distributive (septic) and cardiogenic shock state. Echo with EF reduced to 25% from previous 45%. However, coox is 70% and stable. He is warm and well perfused, I suspect distributive shock is the main contributor. Improving, off pressors. - D/C levophed  order - Midodrine  10 mg TID - Antibiotics for pneumonia per below - Target MAP > 65 mmHg  #HFrEF: > Appears euvolemic to hypervolemic on examination. - Euvolemic today, no more lasix  -  Holding GDMT due to low BP  #Atrial Fibrillation: - Heparin  drip - Amiodarone  200 mg daily   #R malignant effusion: CXR shows re accumulation. 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 CXR looks improved, patient feels better. - Plan for pleurX by IR on 1/20  #CAP: - Azithromycin  500 mg x 3 days (ends 1/18) - Zosyn  (ends 1/21)  #Stress Ulcer PPx: PPI  #Nutrition: regular diet  #VTE ppx: heparin  drip  Disposition: Transfer to med surg  Patient Lines/Drains/Airways Status     Active Line/Drains/Airways     Name Placement date Placement time Site Days   Implanted Port 07/06/24 Left Chest 07/06/24  0940  Chest  5   Wound 07/06/24 0950 Pressure Injury Coccyx Bilateral Stage 1 -  Intact skin with non-blanchable redness of a localized area usually over a bony prominence. 07/06/24  0950  Coccyx  5            Labs   CBC: Recent Labs  Lab 07/07/24 0725 07/08/24 0519 07/09/24 0445 07/10/24 0447 07/11/24 0409  WBC 3.3* 3.7* 2.4* 2.9* 2.2*  HGB 10.1* 9.2* 8.4* 9.3* 10.5*  HCT 30.9* 27.2* 25.4* 28.4* 32.2*  MCV 87.0 85.0 86.7 87.7 87.3  PLT 173 190 150 172 161    Basic Metabolic Panel: Recent Labs  Lab 07/07/24 0725 07/08/24 0519 07/09/24 0445 07/09/24 1537 07/10/24 0447 07/11/24 0409  NA 134* 134* 137 135 134* 135  K 4.3 4.0 3.8 4.6 4.6 3.5  CL 98 100 102 98 99 96*  CO2 29 25 28 28 28  32  GLUCOSE 143* 138* 116* 147* 117* 99  BUN 16 13 9 9 10 12   CREATININE 0.99 0.94 0.88 0.95 1.02 1.16  CALCIUM  7.7* 7.6* 7.6* 7.7* 8.0* 7.9*  MG 2.6* 2.2 1.8 2.3 2.1 1.8  PHOS 2.1* 2.4* 2.5  --  2.2* 4.2   GFR: Estimated Creatinine Clearance: 44.7 mL/min (by C-G formula based on SCr of 1.16 mg/dL). Recent Labs  Lab 07/08/24 0519 07/09/24 0445 07/10/24 0447 07/11/24 0409  WBC 3.7* 2.4* 2.9* 2.2*    Liver Function Tests: Recent Labs  Lab 07/06/24 1147  PROT 6.4*   No results for input(s): LIPASE, AMYLASE in the last 168 hours. No results for input(s): AMMONIA in the last 168 hours.  ABG    Component Value Date/Time   HCO3 27.3 01/29/2024 1850   TCO2 27 05/30/2021 0810   O2SAT 52.3 07/10/2024 1121      Coagulation Profile: Recent Labs  Lab 07/11/24 0409  INR 1.0    Cardiac Enzymes: No results for input(s): CKTOTAL, CKMB, CKMBINDEX, TROPONINI in the last 168 hours.  HbA1C: HB A1C (BAYER DCA - WAIVED)  Date/Time Value Ref Range Status  01/07/2016 09:00 AM 5.9 <7.0 % Final    Comment:                                          Diabetic Adult            <7.0                                       Healthy Adult        4.3 -  5.7                                                           (DCCT/NGSP) American Diabetes Association's Summary of Glycemic Recommendations for Adults with Diabetes: Hemoglobin A1c <7.0%. More stringent glycemic goals (A1c <6.0%) may further reduce complications at the cost of increased risk of hypoglycemia.    Hgb A1c MFr Bld  Date/Time Value Ref Range Status  10/02/2023 08:39 AM 6.1 (H) 4.8 - 5.6 % Final    Comment:             Prediabetes: 5.7 - 6.4          Diabetes: >6.4          Glycemic control for adults with diabetes: <7.0   07/13/2017 09:30 AM 5.9 (H) 4.8 - 5.6 % Final    Comment:             Prediabetes: 5.7 - 6.4          Diabetes: >6.4          Glycemic control for adults with diabetes: <7.0     CBG: Recent Labs  Lab 07/10/24 0713 07/10/24 1127 07/10/24 1525 07/10/24 2320 07/11/24 0751  GLUCAP 118* 109* 105* 104* 124*    Review of Systems:   Not obtained  Past Medical History:  He,  has a past medical history of Arthritis, Carotid artery stenosis without cerebral infarction, Chronic cholecystitis, Chronic constipation, COPD (chronic obstructive pulmonary disease) (HCC), Coronary artery disease (2002), Depression, DOE (dyspnea on exertion), Dyslipidemia, Essential hypertension, Full dentures, GERD (gastroesophageal reflux disease), History of adenomatous polyp of colon, History of COVID-19, History of diverticulitis of colon, History of kidney stones, Hyperlipidemia, Hypothyroidism, Lung nodule, Osteoporosis, Smokers'  cough (HCC), and Wears glasses.   Surgical History:   Past Surgical History:  Procedure Laterality Date   BRONCHIAL BIOPSY  04/25/2024   Procedure: BRONCHOSCOPY, WITH BIOPSY;  Surgeon: Shelah Lamar RAMAN, MD;  Location: [facility] ENDOSCOPY;  Service: Pulmonary;;   BRONCHIAL BRUSHINGS  04/25/2024   Procedure: BRONCHOSCOPY, WITH BRUSH BIOPSY;  Surgeon: Shelah Lamar RAMAN, MD;  Location: [facility] ENDOSCOPY;  Service: Pulmonary;;   BRONCHIAL NEEDLE ASPIRATION BIOPSY  04/25/2024   Procedure: BRONCHOSCOPY, WITH NEEDLE ASPIRATION BIOPSY;  Surgeon: Shelah Lamar RAMAN, MD;  Location: [facility] ENDOSCOPY;  Service: Pulmonary;;   BRONCHOSCOPY  2005   CARDIAC CATHETERIZATION  08/11/2000   @[facility]  by dr morris;   preserved LVF,  ostial RCA 70-75%, other nonobstructive disease   CARDIAC CATHETERIZATION  03/30/2008   @[facility]  by dr hochrein;  occluded RCA and other nonobstructive involving LAD, CFx, D1,OM1  and left to right collateral filling,  mild inferior hypokinesis,  ef 55%   CAROTID ENDARTERECTOMY Right 09/24/2007   @[facility]  by dr eliza   COLONOSCOPY  01/19/2017   by armsbruster   EXCISION NEUROMA Left 10/01/2005   @[facility] ;  left inguinal   EXTRACORPOREAL SHOCK WAVE LITHOTRIPSY  2015   INGUINAL HERNIA REPAIR Left    01-13-2003 and 07-22-2006 both @[facility]    LAPAROSCOPIC CHOLECYSTECTOMY SINGLE SITE WITH INTRAOPERATIVE CHOLANGIOGRAM N/A 05/30/2021   Procedure: LAPAROSCOPIC CHOLECYSTECTOMY SINGLE SITE WITH INTRAOPERATIVE CHOLANGIOGRAM;  Surgeon: Sheldon Standing, MD;  Location: [facility];  Service: General;  Laterality: N/A;   LAPAROSCOPIC INGUINAL HERNIA REPAIR Bilateral 04/28/2007   @[facility]    LEFT  HEART CATH AND CORONARY ANGIOGRAPHY N/A 08/04/2017   Procedure: LEFT HEART CATH AND CORONARY ANGIOGRAPHY;  Surgeon: Verlin Lonni BIRCH, MD;  Location: [facility] INVASIVE CV LAB;  Service: Cardiovascular;  Laterality: N/A;   THROAT SURGERY  1997   pt stated removal of tumor that was benign   VIDEO BRONCHOSCOPY WITH ENDOBRONCHIAL ULTRASOUND  Right 04/25/2024   Procedure: BRONCHOSCOPY, WITH EBUS;  Surgeon: Shelah Lamar RAMAN, MD;  Location: [facility] ENDOSCOPY;  Service: Pulmonary;  Laterality: Right;  Right hilar mass and suspected  lymph node involvement     Social History:   reports that he quit smoking about 5 months ago. His smoking use included cigarettes. He started smoking about 12 months ago. He has a 58.9 pack-year smoking history. He has never used smokeless tobacco. He reports that he does not currently use alcohol. He reports that he does not use drugs.   Family History:  His family history includes Alcohol abuse in his brother; Colon cancer (age of onset: 37) in his brother; Colon polyps in his brother; Healthy in his daughter, son, son, and son; Lung disease in his brother, brother, and father; Lupus in his brother and mother. There is no history of Esophageal cancer, Rectal cancer, Stomach cancer, Pancreatic cancer, Crohn's disease, or Ulcerative colitis.   Allergies Allergies[1]   Home Medications  Prior to Admission medications  Medication Sig Start Date End Date Taking? Authorizing Provider  albuterol  (PROVENTIL ) (2.5 MG/3ML) 0.083% nebulizer solution INHALE ONE vial by NEBULIZER EVERY 6 HOURS AS NEEDED FOR WHEEZING OR SHORTNESS OF BREATH 07/01/24  Yes Dettinger, [name], MD  albuterol  (VENTOLIN  HFA) 108 (90 Base) MCG/ACT inhaler Inhale 2 puffs into the lungs every 6 (six) hours as needed for wheezing or shortness of breath. 07/02/24  Yes [provider]  ALPRAZolam  (XANAX ) 0.5 MG tablet Take 0.5 mg by mouth at bedtime as needed for sleep or anxiety. 06/24/24  Yes [provider]  amiodarone  (PACERONE ) 200 MG tablet Take 400 mg by mouth 2 (two) times daily. Take 2 tablets (400 mg total) by mouth 2 (two) times a day with meals for 5 days, THEN 1 tablet (200 mg total) 2 (two) times a day with meals for 7 days, THEN 1 tablet (200 mg total) daily.   Yes [provider]  Bempedoic Acid -Ezetimibe  (NEXLIZET )  180-10 MG TABS TAKE ONE TABLET DAILY 12/22/23  Yes Dettinger, [name], MD  bisoprolol  (ZEBETA ) 5 MG tablet Take 2.5 mg by mouth daily. 06/22/24  Yes [provider]  budesonide -glycopyrrolate -formoterol  (BREZTRI  AEROSPHERE) 160-9-4.8 MCG/ACT AERO inhaler Inhale 2 puffs into the lungs in the morning and at bedtime. 01/31/24  Yes Emokpae, Courage, MD  ELIQUIS  5 MG TABS tablet Take 5 mg by mouth 2 (two) times daily. 06/22/24  Yes [provider]  fluticasone  (FLONASE ) 50 MCG/ACT nasal spray Place 2 sprays into both nostrils daily. 08/10/23  Yes St Morton Hummer, Nena, NP  HYDROcodone bit-homatropine (HYCODAN) 5-1.5 MG/5ML syrup Take 5 mLs by mouth 4 (four) times daily as needed for cough. 06/30/24  Yes [provider]  hydrOXYzine  (VISTARIL ) 25 MG capsule TAKE ONE CAPSULE EVERY 8 HOURS AS NEEDED 05/12/24  Yes Dettinger, [name], MD  levothyroxine  (SYNTHROID ) 50 MCG tablet Take 1 tablet (50 mcg total) by mouth daily before breakfast. 01/31/24  Yes Emokpae, Courage, MD  nitroGLYCERIN  (NITROSTAT ) 0.4 MG SL tablet Place 1 tablet (0.4 mg total) under the tongue every 5 (five) minutes as needed for chest pain. 02/26/24 05/30/97 Yes Miriam Norris, NP  omeprazole  (PRILOSEC) 20 MG capsule Take 1 capsule (20 mg total) by mouth daily. 01/31/24  Yes Pearlean Manus, MD  ondansetron  (ZOFRAN ) 8 MG tablet Take 8 mg by mouth every 8 (eight) hours as needed for vomiting. 06/06/24  Yes [provider]  prochlorperazine (COMPAZINE) 10 MG tablet Take 10 mg by mouth every 8 (eight) hours as needed for nausea or vomiting. 06/06/24  Yes [provider]  traZODone  (DESYREL ) 100 MG tablet Take 1 tablet (100 mg total) by mouth at bedtime. 02/11/24  Yes Dettinger, [name], MD  cefdinir  (OMNICEF ) 300 MG capsule Take 300 mg by mouth 2 (two) times daily. Patient not taking: Reported on 07/07/2024 06/28/24   [provider]  doxycycline  (VIBRA -TABS) 100 MG tablet Take 100 mg by mouth 2  (two) times daily. Patient not taking: Reported on 07/07/2024 06/28/24   [provider]  OXYGEN  Inhale 2 L into the lungs daily.    [provider]  valsartan  (DIOVAN ) 80 MG tablet Take 80 mg by mouth daily. Patient not taking: Reported on 07/07/2024 05/13/24   [provider]     I have spent 35 minutes evaluating patient, reviewing chart, and discussing plan of care with patient, family, pharmacist on round and primary medical team.  Paula Southerly, MD [facility]              [1]  Allergies Allergen Reactions   Flomax  [Tamsulosin  Hcl] Other (See Comments)    Made patient hypotensive, clammy, sweaty, and feel like he was going to pass out (EMS had to be called)   Crestor [Rosuvastatin Calcium ] Other (See Comments)    Lethargy and muscle aches   Lipitor [Atorvastatin Calcium ] Other (See Comments)    Body cramps, lethargy, and muscle aches   "

## 2024-07-11 NOTE — Progress Notes (Signed)
 Physical Therapy Treatment Patient Details Name: [name] [name] MRN: [id number] DOB: 23-Jan-1951 Today's Date: 07/11/2024   History of Present Illness 74 y.o. male presents to [facility] hospital on 07/06/2024 as a transfer from [facility] where he was found to have R effusion and atelectasis. PMH includes R NSCLC undergoing chemo and radiation, emphysema, HLD, CAD, COPD.    PT Comments  Currently pt is Mod I for bed mobility, CGA for sit to stand and short distance gait without an AD. Pt declined further gait reporting he ambulated a long distance with PT last time and was extremely tired. Pt reports 6/10 DOE after ambulating 20 ft without an AD and would benefit from transport chair for MD appt on discharge from acute hospital setting. Due to pt current functional status, home set up and available assistance at home recommending skilled physical therapy services 3x/week in order to address strength, balance and functional mobility to decrease risk for falls, injury and re-hospitalization.       If plan is discharge home, recommend the following: A little help with walking and/or transfers;A little help with bathing/dressing/bathroom;Assistance with cooking/housework;Help with stairs or ramp for entrance;Assist for transportation     Equipment Recommendations  Other (comment) (transport chair would be beneficial)       Precautions / Restrictions Precautions Precautions: Fall Recall of Precautions/Restrictions: Intact Precaution/Restrictions Comments: port, dyspnea Restrictions Weight Bearing Restrictions Per Provider Order: No     Mobility  Bed Mobility Overal bed mobility: Modified Independent             General bed mobility comments: HOB elevated    Transfers Overall transfer level: Needs assistance Equipment used: None Transfers: Sit to/from Stand Sit to Stand: Contact guard assist           General transfer comment: CGA without AD. Pt denies  dizziness/lightheadedness.    Ambulation/Gait Ambulation/Gait assistance: Contact guard assist Gait Distance (Feet): 20 Feet Assistive device: None Gait Pattern/deviations: Step-through pattern Gait velocity: decreased Gait velocity interpretation: 1.31 - 2.62 ft/sec, indicative of limited community ambulator   General Gait Details: steady step-through gait with CGA for balance.       Balance Overall balance assessment: Needs assistance Sitting-balance support: No upper extremity supported, Feet supported Sitting balance-Leahy Scale: Good     Standing balance support: Single extremity supported Standing balance-Leahy Scale: Fair Standing balance comment: no overt LOB      Communication Communication Communication: No apparent difficulties  Cognition Arousal: Alert Behavior During Therapy: WFL for tasks assessed/performed   PT - Cognitive impairments: No apparent impairments       Following commands: Intact      Cueing Cueing Techniques: Verbal cues     General Comments General comments (skin integrity, edema, etc.): Pt on 2L o2 via [place] with HR 86 bpm and O2 sats 94% with cues for breathing through the nose due to initially pt was heavily breathing through the mouth. Pt reports 6/10 DOE and declined further gait      Pertinent Vitals/Pain Pain Assessment Pain Assessment: No/denies pain     PT Goals (current goals can now be found in the care plan section) Acute Rehab PT Goals Patient Stated Goal: to improve activity tolerance PT Goal Formulation: With patient Time For Goal Achievement: 07/22/24 Potential to Achieve Goals: Fair Additional Goals Additional Goal #1: Pt will report 3/10 DOE or less when ambulating for >150' to demonstrate improved tolerance for household mobility Progress towards PT goals: Progressing toward goals    Frequency  Min 2X/week      PT Plan  Continue with current POC        AM-PAC PT 6 Clicks Mobility   Outcome  Measure  Help needed turning from your back to your side while in a flat bed without using bedrails?: None Help needed moving from lying on your back to sitting on the side of a flat bed without using bedrails?: None Help needed moving to and from a bed to a chair (including a wheelchair)?: A Little Help needed standing up from a chair using your arms (e.g., wheelchair or bedside chair)?: A Little Help needed to walk in hospital room?: A Little Help needed climbing 3-5 steps with a railing? : A Little 6 Click Score: 20    End of Session Equipment Utilized During Treatment: Gait belt;Oxygen  Activity Tolerance: Patient tolerated treatment well Patient left: in chair;with call bell/phone within reach;with chair alarm set Nurse Communication: Mobility status PT Visit Diagnosis: Other abnormalities of gait and mobility (R26.89);Muscle weakness (generalized) (M62.81)     Time: 8956-8893 PT Time Calculation (min) (ACUTE ONLY): 23 min  Charges:    $Therapeutic Activity: 23-37 mins PT General Charges $$ ACUTE PT VISIT: 1 Visit                    Dorothyann Maier, DPT, CLT  Acute Rehabilitation Services Office: [phone] (Secure chat preferred)    Dorothyann VEAR Maier 07/11/2024, 11:16 AM

## 2024-07-11 NOTE — Plan of Care (Signed)
" °  Problem: Education: Goal: Knowledge of General Education information will improve Description: Including pain rating scale, medication(s)/side effects and non-pharmacologic comfort measures Outcome: Progressing   Problem: Health Behavior/Discharge Planning: Goal: Ability to manage health-related needs will improve Outcome: Progressing   Problem: Clinical Measurements: Goal: Ability to maintain clinical measurements within normal limits will improve Outcome: Progressing Goal: Will remain free from infection Outcome: Progressing Goal: Diagnostic test results will improve Outcome: Progressing Goal: Respiratory complications will improve Outcome: Progressing Goal: Cardiovascular complication will be avoided Outcome: Progressing   Problem: Activity: Goal: Risk for activity intolerance will decrease Outcome: Progressing   Problem: Nutrition: Goal: Adequate nutrition will be maintained Outcome: Progressing   Problem: Coping: Goal: Level of anxiety will decrease Outcome: Progressing   Problem: Elimination: Goal: Will not experience complications related to bowel motility Outcome: Progressing Goal: Will not experience complications related to urinary retention Outcome: Progressing   Problem: Pain Managment: Goal: General experience of comfort will improve and/or be controlled Outcome: Progressing   Problem: Safety: Goal: Ability to remain free from injury will improve Outcome: Progressing   Problem: Skin Integrity: Goal: Risk for impaired skin integrity will decrease Outcome: Progressing   Problem: Education: Goal: Knowledge of the prescribed therapeutic regimen will improve Outcome: Progressing   Problem: Activity: Goal: Ability to perform activities at highest level will improve Outcome: Progressing   Problem: Bowel/Gastric: Goal: Occurrences of nausea will decrease Outcome: Progressing Goal: Bowel function will improve Outcome: Progressing   Problem:  Nutritional: Goal: Maintenance of adequate nutrition will improve Outcome: Progressing   Problem: Clinical Measurements: Goal: Will remain free from infection Outcome: Progressing   Problem: Skin Integrity: Goal: Status of oral mucous membranes will improve Outcome: Progressing   Problem: Education: Goal: Knowledge of the prescribed therapy will improve Outcome: Progressing   Problem: Bowel/Gastric: Goal: Occurrences of nausea, vomiting, and/or diarrhea will decrease Outcome: Progressing   Problem: Coping: Goal: Level of anxiety will decrease Outcome: Progressing Goal: Ability to identify and develop effective coping behavior will improve Outcome: Progressing   Problem: Nutritional: Goal: Will achieve and/or maintain adequate nutritional intake Outcome: Progressing   Problem: Skin Integrity: Goal: Risk for impaired skin integrity will decrease Outcome: Progressing   Problem: Urinary Elimination: Goal: Complications of radiation-induced cystitis will be minimized/avoided Outcome: Progressing   Problem: Fatigue: Goal: Expression of feelings of increased energy will improve Outcome: Progressing   Problem: Education: Goal: Knowledge of disease or condition will improve Outcome: Progressing Goal: Understanding of medication regimen will improve Outcome: Progressing Goal: Individualized Educational Video(s) Outcome: Progressing   Problem: Activity: Goal: Ability to tolerate increased activity will improve Outcome: Progressing   Problem: Cardiac: Goal: Ability to achieve and maintain adequate cardiopulmonary perfusion will improve Outcome: Progressing   Problem: Health Behavior/Discharge Planning: Goal: Ability to safely manage health-related needs after discharge will improve Outcome: Progressing   "

## 2024-07-11 NOTE — Progress Notes (Signed)
 eLink Physician-Brief Progress Note Patient Name: [name] [name] DOB: [birth year] MRN: [id number]   Date of Service  07/11/2024  HPI/Events of Note  Leukopenic with likely neutropenia  eICU Interventions  Protective environment     Intervention Category Minor Interventions: Routine modifications to care plan (e.g. PRN medications for pain, fever)  [name] [name] 07/11/2024, 6:16 AM

## 2024-07-11 NOTE — Progress Notes (Signed)
 PHARMACY - ANTICOAGULATION CONSULT NOTE  Pharmacy Consult:  Heparin  Indication: atrial fibrillation  Allergies[1]  Patient Measurements: Height: 5' 10 (177.8 cm) Weight: 55.7 kg (122 lb 12.7 oz) IBW/kg (Calculated) : 73 HEPARIN  DW (KG): 55.3  Vital Signs: Temp: 97.5 F (36.4 C) (01/19 0317) Temp Source: Axillary (01/19 0317) BP: 104/51 (01/19 0630) Pulse Rate: 96 (01/19 0630)  Labs: Recent Labs    07/09/24 0445 07/09/24 1537 07/10/24 0447 07/11/24 0408 07/11/24 0409  HGB 8.4*  --  9.3*  --  10.5*  HCT 25.4*  --  28.4*  --  32.2*  PLT 150  --  172  --  161  APTT 73*  --  76*  --   --   LABPROT  --   --   --   --  13.5  INR  --   --   --   --  1.0  HEPARINUNFRC 0.41  --  0.33 0.30  --   CREATININE 0.88 0.95 1.02  --  1.16    Estimated Creatinine Clearance: 44.7 mL/min (by C-G formula based on SCr of 1.16 mg/dL).   Assessment: 48 YOM with lung cancer transferred from [facility] to [facility] for thoracentesis.  Patient has a history of Afib on Eliquis  PTA.  Last Eliquis  dose documented on 1/13  AM per [facility] report upon transfer.  Pharmacy consulted to manage IV heparin .    Heparin  level therapeutic and low normal.  Noted documentation of of blood loss total on 1/17 and 1/18, but there is no bleeding when clarified with provider/RN.  CBC stable.  Goal of Therapy:  Heparin  level 0.3-0.7 units/ml Monitor platelets by anticoagulation protocol: Yes   Plan:  Increase IV heparin  slightly to 600 units/hr Daily heparin  level and CBC Follow-up ability to transition back to oral therapy as able.   Aryonna Gunnerson D. Lendell, PharmD, BCPS, BCCCP 07/11/2024, 9:25 AM      [1]  Allergies Allergen Reactions   Flomax  [Tamsulosin  Hcl] Other (See Comments)    Made patient hypotensive, clammy, sweaty, and feel like he was going to pass out (EMS had to be called)   Crestor [Rosuvastatin Calcium ] Other (See Comments)    Lethargy and muscle aches   Lipitor [Atorvastatin Calcium ] Other (See  Comments)    Body cramps, lethargy, and muscle aches

## 2024-07-12 ENCOUNTER — Telehealth: Payer: Self-pay | Admitting: Pulmonary Disease

## 2024-07-12 ENCOUNTER — Inpatient Hospital Stay (HOSPITAL_COMMUNITY)

## 2024-07-12 DIAGNOSIS — I480 Paroxysmal atrial fibrillation: Secondary | ICD-10-CM

## 2024-07-12 DIAGNOSIS — I5021 Acute systolic (congestive) heart failure: Secondary | ICD-10-CM

## 2024-07-12 HISTORY — PX: IR TUNNELED CENTRAL VENOUS CATH PLC W IMG: IMG1939

## 2024-07-12 LAB — BASIC METABOLIC PANEL WITH GFR
Anion gap: 8 (ref 5–15)
BUN: 13 mg/dL (ref 8–23)
CO2: 29 mmol/L (ref 22–32)
Calcium: 7.6 mg/dL — ABNORMAL LOW (ref 8.9–10.3)
Chloride: 97 mmol/L — ABNORMAL LOW (ref 98–111)
Creatinine, Ser: 1.1 mg/dL (ref 0.61–1.24)
GFR, Estimated: >60 mL/min
Glucose, Bld: 86 mg/dL (ref 70–99)
Potassium: 4.1 mmol/L (ref 3.5–5.1)
Sodium: 133 mmol/L — ABNORMAL LOW (ref 135–145)

## 2024-07-12 LAB — HEPARIN LEVEL (UNFRACTIONATED): Heparin Unfractionated: 0.27 [IU]/mL — ABNORMAL LOW (ref 0.30–0.70)

## 2024-07-12 LAB — CBC
HCT: 26 % — ABNORMAL LOW (ref 39.0–52.0)
Hemoglobin: 8.4 g/dL — ABNORMAL LOW (ref 13.0–17.0)
MCH: 28.7 pg (ref 26.0–34.0)
MCHC: 32.3 g/dL (ref 30.0–36.0)
MCV: 88.7 fL (ref 80.0–100.0)
Platelets: 175 K/uL (ref 150–400)
RBC: 2.93 MIL/uL — ABNORMAL LOW (ref 4.22–5.81)
RDW: 15.8 % — ABNORMAL HIGH (ref 11.5–15.5)
WBC: 2.7 K/uL — ABNORMAL LOW (ref 4.0–10.5)
nRBC: 0 % (ref 0.0–0.2)

## 2024-07-12 LAB — CULTURE, BLOOD (ROUTINE X 2)
Culture: NO GROWTH
Culture: NO GROWTH
Special Requests: ADEQUATE

## 2024-07-12 LAB — MAGNESIUM: Magnesium: 2.1 mg/dL (ref 1.7–2.4)

## 2024-07-12 MED ORDER — SODIUM CHLORIDE 0.9% FLUSH
10.0000 mL | INTRAVENOUS | Status: DC | PRN
  Administered 2024-07-14: 10 mL

## 2024-07-12 MED ORDER — ACETAMINOPHEN 500 MG PO TABS
1000.0000 mg | ORAL_TABLET | Freq: Three times a day (TID) | ORAL | Status: DC | PRN
  Administered 2024-07-12 – 2024-07-14 (×4): 1000 mg via ORAL
  Filled 2024-07-12 (×4): qty 2

## 2024-07-12 MED ORDER — FENTANYL CITRATE (PF) 100 MCG/2ML IJ SOLN
INTRAMUSCULAR | Status: AC | PRN
  Administered 2024-07-12: 25 ug via INTRAVENOUS

## 2024-07-12 MED ORDER — MIDAZOLAM HCL 2 MG/2ML IJ SOLN
INTRAMUSCULAR | Status: AC
  Filled 2024-07-12: qty 2

## 2024-07-12 MED ORDER — LIDOCAINE-EPINEPHRINE 1 %-1:100000 IJ SOLN
INTRAMUSCULAR | Status: AC
  Filled 2024-07-12: qty 20

## 2024-07-12 MED ORDER — CEFAZOLIN SODIUM-DEXTROSE 2-4 GM/100ML-% IV SOLN
INTRAVENOUS | Status: AC | PRN
  Administered 2024-07-12: 2 g via INTRAVENOUS

## 2024-07-12 MED ORDER — MIDAZOLAM HCL (PF) 2 MG/2ML IJ SOLN
INTRAMUSCULAR | Status: AC | PRN
  Administered 2024-07-12: 1 mg via INTRAVENOUS

## 2024-07-12 MED ORDER — HEPARIN (PORCINE) 25000 UT/250ML-% IV SOLN
700.0000 [IU]/h | INTRAVENOUS | Status: DC
  Administered 2024-07-12: 700 [IU]/h via INTRAVENOUS

## 2024-07-12 MED ORDER — LIDOCAINE-EPINEPHRINE (PF) 1 %-1:200000 IJ SOLN
10.0000 mL | Freq: Once | INTRAMUSCULAR | Status: AC
  Administered 2024-07-12: 10 mL

## 2024-07-12 MED ORDER — FENTANYL CITRATE (PF) 100 MCG/2ML IJ SOLN
INTRAMUSCULAR | Status: AC
  Filled 2024-07-12: qty 2

## 2024-07-12 MED ORDER — CEFAZOLIN SODIUM-DEXTROSE 2-4 GM/100ML-% IV SOLN
INTRAVENOUS | Status: AC
  Filled 2024-07-12: qty 100

## 2024-07-12 NOTE — Progress Notes (Signed)
 IR had been holding procedure for pleur-x pending coordinating drain management and sharing plans for placement with patient's oncologist in case there were any reservations from his perspective. Dr. Letty' office (this patient's oncologist with [facility]) was closed over the weekend and yesterday. I was able to reach him this morning and touch base about this patient. He agrees with plan to place pleur-x based on patient's prognosis, but his office does not manage the drains after placement.  Communicated with team. Pulmonologist Dr. [name] responded that he is able to accept this patient at his clinic and will be able to follow with the patient after IR placement, as IR does not provide pleur-x education, orders for frequency/volume, or home health management, etc. IR service is available for specific concerns with drain function such as malposition.  Team aware that patient will need home health established prior to discharge. IR will send order sheet and first box of supplies up with patient post procedure.   Heparin  hold to be started, IR plans to proceed with placement today.   Ayan Yankey NP 07/12/2024 9:49 AM

## 2024-07-12 NOTE — Procedures (Signed)
 Interventional Radiology Procedure Note  Procedure: US  AND FLUORO RT CHEST TUNNELED PLEURAL DRAIN(PLEURX)  700CC THORACENTESIS     Complications: None  Estimated Blood Loss:  0  Findings: FULL REPORT IN PACS     EMERSON FREDERIC SPECKING, MD

## 2024-07-12 NOTE — Plan of Care (Signed)
  Problem: Education: Goal: Knowledge of General Education information will improve Description: Including pain rating scale, medication(s)/side effects and non-pharmacologic comfort measures Outcome: Progressing   Problem: Clinical Measurements: Goal: Ability to maintain clinical measurements within normal limits will improve Outcome: Progressing Goal: Will remain free from infection Outcome: Progressing Goal: Diagnostic test results will improve Outcome: Progressing Goal: Respiratory complications will improve Outcome: Progressing   Problem: Pain Managment: Goal: General experience of comfort will improve and/or be controlled Outcome: Progressing

## 2024-07-12 NOTE — Progress Notes (Signed)
 PHARMACY - ANTICOAGULATION CONSULT NOTE  Pharmacy Consult:  Heparin  Indication: atrial fibrillation  Allergies[1]  Patient Measurements: Height: 5' 10 (177.8 cm) Weight: 55.1 kg (121 lb 7.6 oz) IBW/kg (Calculated) : 73 HEPARIN  DW (KG): 55.3  Vital Signs: Temp: 97.8 F (36.6 C) (01/20 0300) Temp Source: Oral (01/20 0300) BP: 107/52 (01/20 0300) Pulse Rate: 92 (01/20 0300)  Labs: Recent Labs    07/10/24 0447 07/11/24 0408 07/11/24 0409 07/12/24 0224  HGB 9.3*  --  10.5* 8.4*  HCT 28.4*  --  32.2* 26.0*  PLT 172  --  161 175  APTT 76*  --   --   --   LABPROT  --   --  13.5  --   INR  --   --  1.0  --   HEPARINUNFRC 0.33 0.30  --  0.27*  CREATININE 1.02  --  1.16 1.10    Estimated Creatinine Clearance: 46.6 mL/min (by C-G formula based on SCr of 1.1 mg/dL).   Assessment: 67 YOM with lung cancer transferred from [facility] to [facility] for thoracentesis.  Patient has a history of Afib on Eliquis  PTA.  Last Eliquis  dose documented on 1/13  AM per [facility] report upon transfer.  Pharmacy consulted to manage IV heparin .    1/20 AM update: Hgb 8.4 PLT wnl  Goal of Therapy:  Heparin  level 0.3-0.7 units/ml Monitor platelets by anticoagulation protocol: Yes   Plan:  Heparin  gtt stopped at 10:00 AM for IR procedure Restart heparin  700 units/hr on 1/20 at 1700 Heparin  level 0100 [1/21] Daily heparin  level and CBC Follow-up ability to transition back to oral therapy as able.    Aisley Whan BS, PharmD, BCPS Clinical Pharmacist 07/12/2024 7:05 AM  Contact: [phone] after 3 PM     [1]  Allergies Allergen Reactions   Flomax  [Tamsulosin  Hcl] Other (See Comments)    Made patient hypotensive, clammy, sweaty, and feel like he was going to pass out (EMS had to be called)   Crestor [Rosuvastatin Calcium ] Other (See Comments)    Lethargy and muscle aches   Lipitor [Atorvastatin Calcium ] Other (See Comments)    Body cramps, lethargy, and muscle aches

## 2024-07-12 NOTE — TOC Progression Note (Addendum)
 Transition of Care [facility]) - Progression Note    Patient Details  Name: [name] [name] MRN: [id number] Date of Birth: [birth year]  Transition [facility] CM/SW Contact  [name], RN Phone Number: 07/12/2024, 3:27 PM  Clinical Narrative:      Patient has PleurX drain placed today. CM spoke with [name] regarding Pleurx education, and she will plan to meet with patient and wife for teaching.   Per Dr. [name], plan will be to drain PleurX 3x/week. Patient will follow with Dr. [name] outpatient. Info faxed to [facility] and supply order placed in patient's chart.  Patient had services arranged with [facility], but [facility] reports they are no longer able to see patient with Pleurx drain. CM sent new referral in hub for [facility].   CM will continue to follow.     15:45 - [name] has accepted patient. CM did verify they are able to take for RN for PleurX.                  Expected Discharge Plan and Services                                               Social Drivers of Health (SDOH) Interventions SDOH Screenings   Food Insecurity: No Food Insecurity (07/11/2024)  Housing: Low Risk (07/11/2024)  Transportation Needs: Unknown (07/11/2024)  Utilities: Not At Risk (07/11/2024)  Alcohol Screen: Low Risk (04/01/2023)  Depression (PHQ2-9): Low Risk (06/29/2024)  Financial Resource Strain: Low Risk (06/26/2024)   Received from [facility]  Physical Activity: Inactive (05/02/2024)   Received from [facility]  Social Connections: Socially Integrated (07/11/2024)  Stress: No Stress Concern Present (05/02/2024)   Received from [facility]  Tobacco Use: Medium Risk (07/10/2024)  Health Literacy: Low Risk (05/02/2024)   Received from [facility]    Readmission Risk Interventions    07/07/2024    2:35 PM 01/30/2024    3:58 PM  Readmission Risk Prevention Plan  Post Dischage Appt  Complete  Medication Screening  Complete  Transportation Screening  Complete Complete  PCP or Specialist Appt within 5-7 Days Complete   Home Care Screening Complete   Medication Review (RN CM) Referral to Pharmacy

## 2024-07-12 NOTE — Plan of Care (Signed)
" °  Problem: Education: Goal: Knowledge of General Education information will improve Description: Including pain rating scale, medication(s)/side effects and non-pharmacologic comfort measures Outcome: Progressing   Problem: Health Behavior/Discharge Planning: Goal: Ability to manage health-related needs will improve Outcome: Progressing   Problem: Clinical Measurements: Goal: Ability to maintain clinical measurements within normal limits will improve Outcome: Progressing Goal: Will remain free from infection Outcome: Progressing Goal: Diagnostic test results will improve Outcome: Progressing Goal: Respiratory complications will improve Outcome: Progressing Goal: Cardiovascular complication will be avoided Outcome: Progressing   Problem: Activity: Goal: Risk for activity intolerance will decrease Outcome: Progressing   Problem: Nutrition: Goal: Adequate nutrition will be maintained Outcome: Progressing   Problem: Coping: Goal: Level of anxiety will decrease Outcome: Progressing   Problem: Elimination: Goal: Will not experience complications related to bowel motility Outcome: Progressing Goal: Will not experience complications related to urinary retention Outcome: Progressing   Problem: Pain Managment: Goal: General experience of comfort will improve and/or be controlled Outcome: Progressing   Problem: Safety: Goal: Ability to remain free from injury will improve Outcome: Progressing   Problem: Skin Integrity: Goal: Risk for impaired skin integrity will decrease Outcome: Progressing   Problem: Education: Goal: Knowledge of the prescribed therapeutic regimen will improve Outcome: Progressing   Problem: Activity: Goal: Ability to perform activities at highest level will improve Outcome: Progressing   Problem: Bowel/Gastric: Goal: Occurrences of nausea will decrease Outcome: Progressing Goal: Bowel function will improve Outcome: Progressing   Problem:  Nutritional: Goal: Maintenance of adequate nutrition will improve Outcome: Progressing   Problem: Clinical Measurements: Goal: Will remain free from infection Outcome: Progressing   Problem: Skin Integrity: Goal: Status of oral mucous membranes will improve Outcome: Progressing   Problem: Education: Goal: Knowledge of the prescribed therapy will improve Outcome: Progressing   Problem: Bowel/Gastric: Goal: Occurrences of nausea, vomiting, and/or diarrhea will decrease Outcome: Progressing   Problem: Coping: Goal: Level of anxiety will decrease Outcome: Progressing Goal: Ability to identify and develop effective coping behavior will improve Outcome: Progressing   Problem: Nutritional: Goal: Will achieve and/or maintain adequate nutritional intake Outcome: Progressing   Problem: Skin Integrity: Goal: Risk for impaired skin integrity will decrease Outcome: Progressing   Problem: Urinary Elimination: Goal: Complications of radiation-induced cystitis will be minimized/avoided Outcome: Progressing   Problem: Fatigue: Goal: Expression of feelings of increased energy will improve Outcome: Progressing   Problem: Education: Goal: Knowledge of disease or condition will improve Outcome: Progressing Goal: Understanding of medication regimen will improve Outcome: Progressing Goal: Individualized Educational Video(s) Outcome: Progressing   Problem: Activity: Goal: Ability to tolerate increased activity will improve Outcome: Progressing   Problem: Cardiac: Goal: Ability to achieve and maintain adequate cardiopulmonary perfusion will improve Outcome: Progressing   Problem: Health Behavior/Discharge Planning: Goal: Ability to safely manage health-related needs after discharge will improve Outcome: Progressing   "

## 2024-07-12 NOTE — Progress Notes (Signed)
 "                        PROGRESS NOTE        PATIENT DETAILS Name: [name] [name] Age: 74 y.o. Sex: male Date of Birth: Apr 09, 1951 Admit Date: 07/06/2024 Admitting Physician No admitting provider for patient encounter. ERE:[name], [name], MD  Brief Summary: Patient is a 74 y.o.  male with history of NSCLC-undergoing chemo/radiation at [facility]-presented to [facility] ER with shortness of breath-he was found to have a large right-sided pleural effusion-thoracocentesis was attempted but unsuccessful-he then got hypotensive following IV Lasix  infusion-he was started on norepinephrine -and transferred to [facility] ICU.  Significant events: 1/12 admit to [facility] 1/14 transfer to [facility] 1/17 second thoracentesis, drained 1.4 L 1/18 started midodrine , levophed  is off 1/20>> transferred to [facility]  Significant studies: 1/16>> CXR: Increasing moderate right pleural effusion. 1/16>> echo: EF 25%  Significant microbiology data: 1/14>> body fluid culture: No growth 1/15>> blood culture: No growth  Procedures: 1/17>> thoracocentesis by PCCM  Consults: PCCM IR  Subjective: Lying comfortably in bed-denies any chest pain or shortness of breath.  Objective: Vitals: Blood pressure (!) 104/53, pulse 93, temperature 97.6 F (36.4 C), temperature source Oral, resp. rate (!) 28, height 5' 10 (1.778 m), weight 55.1 kg, SpO2 100%.   Exam: Gen Exam:Alert awake-not in any distress HEENT:atraumatic, normocephalic Chest: B/L clear to auscultation anteriorly CVS:S1S2 regular Abdomen:soft non tender, non distended Extremities:no edema Neurology: Non focal Skin: no rash  Pertinent Labs/Radiology:    Latest Ref Rng & Units 07/12/2024    2:24 AM 07/11/2024    4:09 AM 07/10/2024    4:47 AM  CBC  WBC 4.0 - 10.5 K/uL 2.7  2.2  2.9   Hemoglobin 13.0 - 17.0 g/dL 8.4  89.4  9.3   Hematocrit 39.0 - 52.0 % 26.0  32.2  28.4   Platelets 150 - 400 K/uL 175  161  172     Lab Results  Component Value Date   NA 133  (L) 07/12/2024   K 4.1 07/12/2024   CL 97 (L) 07/12/2024   CO2 29 07/12/2024      Assessment/Plan: Multifactorial shock Etiology thought to be septic/cardiogenic BP has stabilized with supportive care On midodrine -briefly required pressors while in the ICU Remains on antibiotics  Acute on chronic HFrEF Volume status has stabilized BP too soft to initiate GDMT medications-on midodrine  for BP support Unlikely to be a candidate for advanced therapies given underlying malignancy.  PNA Overall improved-continues to have mild leukopenia Zosyn -EOT 1/21 Completed Zithromax  x 3 days  Right-sided pleural effusion Likely malignant S/p thoracocentesis on 1/14 and 1/17 Plan is for PleurX by IR on 1/20  PAF Rate controlled  IV heparin  till all procedures are complete Continue amiodarone   Known history of CAD Currently no anginal symptoms  HTN Bisoprolol /valsartan  on hold  COPD Bronchodilators  Hypothyroidism Synthroid   Debility/deconditioning PT/OT eval-Home health recommended.  Pressure Ulcer: Agree with assessment as outlined below Wound 07/06/24 0950 Pressure Injury Coccyx Bilateral Stage 1 -  Intact skin with non-blanchable redness of a localized area usually over a bony prominence. (Active)    Code status:   Code Status: Limited: Do not attempt resuscitation (DNR) -DNR-LIMITED -Do Not Intubate/DNI    DVT Prophylaxis: SCDs Start: 07/06/24 1043 IV Heparin   Family Communication: None at bedside   Disposition Plan: Status is: Inpatient Remains inpatient appropriate because: Severity of illness   Planned Discharge Destination:Home health   Diet: Diet  Order             Diet NPO time specified Except for: Sips with Meds  Diet effective midnight                     Antimicrobial agents: Anti-infectives (From admission, onward)    Start     Dose/Rate Route Frequency Ordered Stop   07/08/24 1030  azithromycin  (ZITHROMAX ) tablet 500 mg        500  mg Oral Daily 07/08/24 0941 07/10/24 0927   07/07/24 1100  piperacillin -tazobactam (ZOSYN ) IVPB 3.375 g        3.375 g 12.5 mL/hr over 240 Minutes Intravenous Every 8 hours 07/07/24 1005 07/13/24 2359        MEDICATIONS: Scheduled Meds:  amiodarone   200 mg Oral Daily   arformoterol   15 mcg Nebulization BID   budesonide  (PULMICORT ) nebulizer solution  0.5 mg Nebulization BID   Chlorhexidine  Gluconate Cloth  6 each Topical Daily   fluticasone   1 spray Each Nare Daily   levothyroxine   50 mcg Oral Q0600   midodrine   10 mg Oral Q8H   pantoprazole  (PROTONIX ) IV  40 mg Intravenous QHS   polyethylene glycol  17 g Oral Daily   revefenacin   175 mcg Nebulization Daily   senna-docusate  1 tablet Oral BID   traZODone   100 mg Oral QHS   Continuous Infusions:  heparin  Stopped (07/12/24 0959)   piperacillin -tazobactam (ZOSYN )  IV 3.375 g (07/12/24 1044)   PRN Meds:.ALPRAZolam , guaiFENesin -codeine , ipratropium-albuterol , mouth rinse, polyethylene glycol, senna, sodium chloride  flush   I have personally reviewed following labs and imaging studies  LABORATORY DATA: CBC: Recent Labs  Lab 07/08/24 0519 07/09/24 0445 07/10/24 0447 07/11/24 0409 07/12/24 0224  WBC 3.7* 2.4* 2.9* 2.2* 2.7*  HGB 9.2* 8.4* 9.3* 10.5* 8.4*  HCT 27.2* 25.4* 28.4* 32.2* 26.0*  MCV 85.0 86.7 87.7 87.3 88.7  PLT 190 150 172 161 175    Basic Metabolic Panel: Recent Labs  Lab 07/07/24 0725 07/08/24 0519 07/09/24 0445 07/09/24 1537 07/10/24 0447 07/11/24 0409 07/12/24 0224  NA 134* 134* 137 135 134* 135 133*  K 4.3 4.0 3.8 4.6 4.6 3.5 4.1  CL 98 100 102 98 99 96* 97*  CO2 29 25 28 28 28  32 29  GLUCOSE 143* 138* 116* 147* 117* 99 86  BUN 16 13 9 9 10 12 13   CREATININE 0.99 0.94 0.88 0.95 1.02 1.16 1.10  CALCIUM  7.7* 7.6* 7.6* 7.7* 8.0* 7.9* 7.6*  MG 2.6* 2.2 1.8 2.3 2.1 1.8 2.1  PHOS 2.1* 2.4* 2.5  --  2.2* 4.2  --     GFR: Estimated Creatinine Clearance: 46.6 mL/min (by C-G formula based on SCr  of 1.1 mg/dL).  Liver Function Tests: Recent Labs  Lab 07/06/24 1147  PROT 6.4*   No results for input(s): LIPASE, AMYLASE in the last 168 hours. No results for input(s): AMMONIA in the last 168 hours.  Coagulation Profile: Recent Labs  Lab 07/11/24 0409  INR 1.0    Cardiac Enzymes: No results for input(s): CKTOTAL, CKMB, CKMBINDEX, TROPONINI in the last 168 hours.  BNP (last 3 results) No results for input(s): PROBNP in the last 8760 hours.  Lipid Profile: No results for input(s): CHOL, HDL, LDLCALC, TRIG, CHOLHDL, LDLDIRECT in the last 72 hours.  Thyroid  Function Tests: No results for input(s): TSH, T4TOTAL, FREET4, T3FREE, THYROIDAB in the last 72 hours.  Anemia Panel: No results for input(s): VITAMINB12, FOLATE, FERRITIN, TIBC, IRON,  RETICCTPCT in the last 72 hours.  Urine analysis:    Component Value Date/Time   COLORURINE YELLOW 07/07/2024 1022   APPEARANCEUR CLEAR 07/07/2024 1022   APPEARANCEUR Clear 10/02/2023 0811   LABSPEC 1.005 07/07/2024 1022   PHURINE 7.0 07/07/2024 1022   GLUCOSEU NEGATIVE 07/07/2024 1022   HGBUR NEGATIVE 07/07/2024 1022   BILIRUBINUR NEGATIVE 07/07/2024 1022   BILIRUBINUR Negative 10/02/2023 0811   KETONESUR NEGATIVE 07/07/2024 1022   PROTEINUR NEGATIVE 07/07/2024 1022   UROBILINOGEN negative 03/06/2014 0959   UROBILINOGEN 0.2 03/07/2013 1735   NITRITE NEGATIVE 07/07/2024 1022   LEUKOCYTESUR NEGATIVE 07/07/2024 1022    Sepsis Labs: Lactic Acid, Venous No results found for: LATICACIDVEN  MICROBIOLOGY: Recent Results (from the past 240 hours)  MRSA Next Gen by PCR, Nasal     Status: None   Collection Time: 07/06/24  9:38 AM   Specimen: Nasal Mucosa; Nasal Swab  Result Value Ref Range Status   MRSA by PCR Next Gen NOT DETECTED NOT DETECTED Final    Comment: (NOTE) The GeneXpert MRSA Assay (FDA approved for NASAL specimens only), is one component of a comprehensive MRSA  colonization surveillance program. It is not intended to diagnose MRSA infection nor to guide or monitor treatment for MRSA infections. Test performance is not FDA approved in patients less than 51 years old. Performed at [facility], [address], [place], [place] [address]   Body fluid culture w Gram Stain     Status: None   Collection Time: 07/06/24 11:15 AM   Specimen: Pleural Fluid  Result Value Ref Range Status   Specimen Description PLEURAL  Final   Special Requests NONE  Final   Gram Stain NO WBC SEEN NO ORGANISMS SEEN   Final   Culture   Final    NO GROWTH 3 DAYS Performed at [facility], [address], [place], [place] [address]    Report Status 07/09/2024 FINAL  Final  Culture, blood (Routine X 2) w Reflex to ID Panel     Status: None   Collection Time: 07/07/24 10:17 AM   Specimen: BLOOD  Result Value Ref Range Status   Specimen Description BLOOD RIGHT ANTECUBITAL  Final   Special Requests   Final    AEROBIC BOTTLE ONLY Blood Culture results may not be optimal due to an inadequate volume of blood received in culture bottles   Culture   Final    NO GROWTH 5 DAYS Performed at [facility], [address], [place], [place] [address]    Report Status 07/12/2024 FINAL  Final  Culture, blood (Routine X 2) w Reflex to ID Panel     Status: None   Collection Time: 07/07/24 11:16 AM   Specimen: BLOOD LEFT HAND  Result Value Ref Range Status   Specimen Description BLOOD LEFT HAND  Final   Special Requests AEROBIC BOTTLE ONLY Blood Culture adequate volume  Final   Culture   Final    NO GROWTH 5 DAYS Performed at [facility], [address], [place], [place] [address]    Report Status 07/12/2024 FINAL  Final    RADIOLOGY STUDIES/RESULTS: No results found.   LOS: 6 days   Donalda Applebaum, MD  [facility]    To contact the attending provider between 7A-7P or the covering provider during after hours 7P-7A, please log into the  web site [email or web address] and access using universal [place] password for that web site. If you do not  have the password, please call the hospital operator.  07/12/2024, 10:53 AM    "

## 2024-07-12 NOTE — Progress Notes (Signed)
 Heparin  gtt paused per Verbal given by [place]t,NP

## 2024-07-12 NOTE — Telephone Encounter (Signed)
 Scheduled hosp f/u with [facility] on 2/4. Letter mailed, this will also be printed on discharge papers.

## 2024-07-12 NOTE — Progress Notes (Signed)
 Bp dropped 88/54 then 96/43 on retake map of 59, HR 93 afib. MD Ghimire notified. Per MD okay to continue with scheduled Amiodarone  tab administration. Pt denies any s/s of distress.

## 2024-07-12 NOTE — Progress Notes (Signed)
 PHARMACY - ANTICOAGULATION CONSULT NOTE  Pharmacy Consult:  Heparin  Indication: atrial fibrillation  Allergies[1]  Patient Measurements: Height: 5' 10 (177.8 cm) Weight: 55.7 kg (122 lb 12.7 oz) IBW/kg (Calculated) : 73 HEPARIN  DW (KG): 55.3  Vital Signs: Temp: 97.6 F (36.4 C) (01/19 2342) Temp Source: Axillary (01/19 2342) BP: 106/59 (01/19 2342) Pulse Rate: 94 (01/19 2342)  Labs: Recent Labs    07/09/24 0445 07/09/24 1537 07/10/24 0447 07/11/24 0408 07/11/24 0409 07/12/24 0224  HGB 8.4*  --  9.3*  --  10.5* 8.4*  HCT 25.4*  --  28.4*  --  32.2* 26.0*  PLT 150  --  172  --  161 175  APTT 73*  --  76*  --   --   --   LABPROT  --   --   --   --  13.5  --   INR  --   --   --   --  1.0  --   HEPARINUNFRC 0.41  --  0.33 0.30  --  0.27*  CREATININE 0.88   < > 1.02  --  1.16 1.10   < > = values in this interval not displayed.    Estimated Creatinine Clearance: 47.1 mL/min (by C-G formula based on SCr of 1.1 mg/dL).   Assessment: 55 YOM with lung cancer transferred from [facility] to [facility] for thoracentesis.  Patient has a history of Afib on Eliquis  PTA.  Last Eliquis  dose documented on 1/13  AM per [facility] report upon transfer.  Pharmacy consulted to manage IV heparin .    AM: Heparin  level subtherapeutic on 600 units/hr. Per RN, no signs/symptoms of bleeding, CBC shows Hgb 10 > 8.4 and plts 175.   Goal of Therapy:  Heparin  level 0.3-0.7 units/ml Monitor platelets by anticoagulation protocol: Yes   Plan:  Increase IV heparin  slightly to 700 units/hr 8h heparin  level Daily heparin  level and CBC Follow-up ability to transition back to oral therapy as able.   Lynwood Poplar, PharmD, BCPS Clinical Pharmacist 07/12/2024 3:08 AM         [1]  Allergies Allergen Reactions   Flomax  [Tamsulosin  Hcl] Other (See Comments)    Made patient hypotensive, clammy, sweaty, and feel like he was going to pass out (EMS had to be called)   Crestor [Rosuvastatin Calcium ] Other (See  Comments)    Lethargy and muscle aches   Lipitor [Atorvastatin Calcium ] Other (See Comments)    Body cramps, lethargy, and muscle aches

## 2024-07-12 NOTE — Telephone Encounter (Signed)
 Schedule in office after hospital discharge for pleural effusion and pleurX management

## 2024-07-12 NOTE — Progress Notes (Signed)
 This RN was notified by telemetry that pt was ringing of Vfib/vtach. Upon assessment 2/5 leads were off. Pt alert and breathing, Rhythm with clear P wave. Hr at 108. Pt tachycardic and tachypneic due to pain. MD Ghimire notified for need for more pain control.

## 2024-07-13 DIAGNOSIS — I2510 Atherosclerotic heart disease of native coronary artery without angina pectoris: Secondary | ICD-10-CM

## 2024-07-13 DIAGNOSIS — I5021 Acute systolic (congestive) heart failure: Secondary | ICD-10-CM

## 2024-07-13 DIAGNOSIS — I447 Left bundle-branch block, unspecified: Secondary | ICD-10-CM

## 2024-07-13 DIAGNOSIS — R579 Shock, unspecified: Secondary | ICD-10-CM | POA: Diagnosis not present

## 2024-07-13 LAB — CBC
HCT: 26.7 % — ABNORMAL LOW (ref 39.0–52.0)
Hemoglobin: 8.5 g/dL — ABNORMAL LOW (ref 13.0–17.0)
MCH: 28.7 pg (ref 26.0–34.0)
MCHC: 31.8 g/dL (ref 30.0–36.0)
MCV: 90.2 fL (ref 80.0–100.0)
Platelets: 180 K/uL (ref 150–400)
RBC: 2.96 MIL/uL — ABNORMAL LOW (ref 4.22–5.81)
RDW: 16.1 % — ABNORMAL HIGH (ref 11.5–15.5)
WBC: 3 K/uL — ABNORMAL LOW (ref 4.0–10.5)
nRBC: 0 % (ref 0.0–0.2)

## 2024-07-13 LAB — HEPARIN LEVEL (UNFRACTIONATED)
Heparin Unfractionated: 0.45 [IU]/mL (ref 0.30–0.70)
Heparin Unfractionated: 0.23 [IU]/mL — ABNORMAL LOW (ref 0.30–0.70)
Heparin Unfractionated: 0.25 [IU]/mL — ABNORMAL LOW (ref 0.30–0.70)

## 2024-07-13 MED ORDER — APIXABAN 5 MG PO TABS
5.0000 mg | ORAL_TABLET | Freq: Two times a day (BID) | ORAL | Status: DC
  Administered 2024-07-13 – 2024-07-14 (×2): 5 mg via ORAL
  Filled 2024-07-13 (×2): qty 1

## 2024-07-13 MED ORDER — MIDODRINE HCL 5 MG PO TABS
10.0000 mg | ORAL_TABLET | Freq: Three times a day (TID) | ORAL | Status: DC
  Administered 2024-07-13 – 2024-07-14 (×3): 10 mg via ORAL
  Filled 2024-07-13 (×3): qty 2

## 2024-07-13 MED ORDER — HEPARIN (PORCINE) 25000 UT/250ML-% IV SOLN: 750.0000 [IU]/h | INTRAVENOUS | Status: DC

## 2024-07-13 MED ORDER — APIXABAN 5 MG PO TABS: 5.0000 mg | ORAL_TABLET | Freq: Two times a day (BID) | ORAL | Status: DC

## 2024-07-13 NOTE — Plan of Care (Signed)
" °  Problem: Education: Goal: Knowledge of General Education information will improve Description: Including pain rating scale, medication(s)/side effects and non-pharmacologic comfort measures Outcome: Progressing   Problem: Health Behavior/Discharge Planning: Goal: Ability to manage health-related needs will improve Outcome: Progressing   Problem: Clinical Measurements: Goal: Ability to maintain clinical measurements within normal limits will improve Outcome: Progressing Goal: Will remain free from infection Outcome: Progressing Goal: Diagnostic test results will improve Outcome: Progressing Goal: Respiratory complications will improve Outcome: Progressing Goal: Cardiovascular complication will be avoided Outcome: Progressing   Problem: Activity: Goal: Risk for activity intolerance will decrease Outcome: Progressing   Problem: Nutrition: Goal: Adequate nutrition will be maintained Outcome: Progressing   Problem: Coping: Goal: Level of anxiety will decrease Outcome: Progressing   Problem: Elimination: Goal: Will not experience complications related to bowel motility Outcome: Progressing Goal: Will not experience complications related to urinary retention Outcome: Progressing   Problem: Pain Managment: Goal: General experience of comfort will improve and/or be controlled Outcome: Progressing   Problem: Safety: Goal: Ability to remain free from injury will improve Outcome: Progressing   Problem: Skin Integrity: Goal: Risk for impaired skin integrity will decrease Outcome: Progressing   Problem: Education: Goal: Knowledge of the prescribed therapeutic regimen will improve Outcome: Progressing   Problem: Activity: Goal: Ability to perform activities at highest level will improve Outcome: Progressing   Problem: Bowel/Gastric: Goal: Occurrences of nausea will decrease Outcome: Progressing Goal: Bowel function will improve Outcome: Progressing   Problem:  Nutritional: Goal: Maintenance of adequate nutrition will improve Outcome: Progressing   Problem: Clinical Measurements: Goal: Will remain free from infection Outcome: Progressing   Problem: Skin Integrity: Goal: Status of oral mucous membranes will improve Outcome: Progressing   Problem: Education: Goal: Knowledge of the prescribed therapy will improve Outcome: Progressing   Problem: Bowel/Gastric: Goal: Occurrences of nausea, vomiting, and/or diarrhea will decrease Outcome: Progressing   Problem: Coping: Goal: Level of anxiety will decrease Outcome: Progressing Goal: Ability to identify and develop effective coping behavior will improve Outcome: Progressing   Problem: Nutritional: Goal: Will achieve and/or maintain adequate nutritional intake Outcome: Progressing   Problem: Skin Integrity: Goal: Risk for impaired skin integrity will decrease Outcome: Progressing   Problem: Urinary Elimination: Goal: Complications of radiation-induced cystitis will be minimized/avoided Outcome: Progressing   Problem: Fatigue: Goal: Expression of feelings of increased energy will improve Outcome: Progressing   Problem: Education: Goal: Knowledge of disease or condition will improve Outcome: Progressing Goal: Understanding of medication regimen will improve Outcome: Progressing Goal: Individualized Educational Video(s) Outcome: Progressing   Problem: Activity: Goal: Ability to tolerate increased activity will improve Outcome: Progressing   Problem: Cardiac: Goal: Ability to achieve and maintain adequate cardiopulmonary perfusion will improve Outcome: Progressing   Problem: Health Behavior/Discharge Planning: Goal: Ability to safely manage health-related needs after discharge will improve Outcome: Progressing   "

## 2024-07-13 NOTE — TOC Progression Note (Signed)
 Transition of Care [facility]) - Progression Note    Patient Details  Name: [name] [name] MRN: [id number] Date of Birth: [birth year]  [facility] CM/SW Contact  [name], RN Phone Number: 07/13/2024, 3:29 PM  Clinical Narrative:     PleurX education was completed today by [name] with patient and wife. Lauraine does plan to meet with patient and wife again 1/22 at 11 AM to complete PleurX drain and reinforce education.   Continued medical workup.  CM will continue to follow.                    Expected Discharge Plan and Services                                               Social Drivers of Health (SDOH) Interventions SDOH Screenings   Food Insecurity: No Food Insecurity (07/11/2024)  Housing: Low Risk (07/11/2024)  Transportation Needs: Unknown (07/11/2024)  Utilities: Not At Risk (07/11/2024)  Alcohol Screen: Low Risk (04/01/2023)  Depression (PHQ2-9): Low Risk (06/29/2024)  Financial Resource Strain: Low Risk (06/26/2024)   Received from [facility]  Physical Activity: Inactive (05/02/2024)   Received from [facility]  Social Connections: Socially Integrated (07/11/2024)  Stress: No Stress Concern Present (05/02/2024)   Received from [facility]  Tobacco Use: Medium Risk (07/10/2024)  Health Literacy: Low Risk (05/02/2024)   Received from [facility]    Readmission Risk Interventions    07/07/2024    2:35 PM 01/30/2024    3:58 PM  Readmission Risk Prevention Plan  Post Dischage Appt  Complete  Medication Screening  Complete  Transportation Screening Complete Complete  PCP or Specialist Appt within 5-7 Days Complete   Home Care Screening Complete   Medication Review (RN CM) Referral to Pharmacy

## 2024-07-13 NOTE — Plan of Care (Signed)

## 2024-07-13 NOTE — Progress Notes (Addendum)
 PHARMACY - ANTICOAGULATION CONSULT NOTE  Pharmacy Consult:  Heparin  transition to apixaban  Indication: atrial fibrillation  Allergies[1]  Patient Measurements: Height: 5' 10 (177.8 cm) Weight: 55.1 kg (121 lb 7.6 oz) IBW/kg (Calculated) : 73 HEPARIN  DW (KG): 55.3  Vital Signs: Temp: 97.3 F (36.3 C) (01/21 0327) Temp Source: Oral (01/21 0327) BP: 104/61 (01/21 0327) Pulse Rate: 87 (01/21 0327)  Labs: Recent Labs    07/11/24 0409 07/12/24 0224 07/13/24 0334 07/13/24 1110 07/13/24 1308  HGB 10.5* 8.4* 8.5*  --   --   HCT 32.2* 26.0* 26.7*  --   --   PLT 161 175 180  --   --   LABPROT 13.5  --   --   --   --   INR 1.0  --   --   --   --   HEPARINUNFRC  --  0.27* 0.45 0.23* 0.25*  CREATININE 1.16 1.10  --   --   --     Estimated Creatinine Clearance: 46.6 mL/min (by C-G formula based on SCr of 1.1 mg/dL).   Assessment: 62 YOM with lung cancer transferred from [facility] to [facility] for thoracentesis.  Patient has a history of Afib on Eliquis  PTA.  Last Eliquis  dose documented on 1/13  AM per [facility] report upon transfer.  Pharmacy consulted to manage IV heparin .    1/21 AM update: HL 0.25- heparin  was off for a little while Hgb 8.5 PLT wnl  Goal of Therapy:  Heparin  level 0.3-0.7 units/ml Monitor platelets by anticoagulation protocol: Yes   Plan:  Continue heparin  gtt at 750 units/hr. At 2200, please stop heparin  infusion and Start apixaban  5 mg po bid at 2200 Monitor for signs of bleeding   Xandra Laramee BS, PharmD, BCPS Clinical Pharmacist 07/13/2024 11:28 AM  Contact: [phone] after 3 PM      [1]  Allergies Allergen Reactions   Flomax  [Tamsulosin  Hcl] Other (See Comments)    Made patient hypotensive, clammy, sweaty, and feel like he was going to pass out (EMS had to be called)   Crestor [Rosuvastatin Calcium ] Other (See Comments)    Lethargy and muscle aches   Lipitor [Atorvastatin Calcium ] Other (See Comments)    Body cramps, lethargy, and muscle aches

## 2024-07-13 NOTE — Progress Notes (Signed)
 Physical Therapy Treatment Patient Details Name: [name] [name] MRN: [id number] DOB: Dec 22, 1950 Today's Date: 07/13/2024   History of Present Illness 74 y.o. male presents to [facility] hospital on 07/06/2024 as a transfer from [facility] where he was found to have R effusion and atelectasis. PleurX drain placed 1/20. PMH includes R NSCLC undergoing chemo and radiation, emphysema, HLD, CAD, COPD.    PT Comments  Pt reports symptom improvement since last session. Pt continues to benefit from 2L O2 during fucntional activity due to fatigue and SOB. Pt had pleurX drain placed yesterday. Pt requires minA for bed mobility via hand held assist to pull trunk upright due to avoiding rolling on L side when exiting out of L side of bed. Pt quickly stands without AD and reaches for IV pole for support due to being unsteady on feet. Pt ambulated household distances using walker this session with multiple brief standing rest breaks to manage SOB. Pt demonstrates good management of walker and safety awareness regarding level of fatigue. 6-7/10 reported on DOE scale at end of session, pt stating I sure am breathing hard. Pt would benefit from continued PT services focused on balance, strength, gait, and stair trial per home set up to promote safety and independence with functional mobility at home.    If plan is discharge home, recommend the following: A little help with walking and/or transfers;A little help with bathing/dressing/bathroom;Assistance with cooking/housework;Assist for transportation;Help with stairs or ramp for entrance   Can travel by private vehicle        Equipment Recommendations  Other (comment) (May benefit from transport chair)    Recommendations for Other Services       Precautions / Restrictions Precautions Precautions: Fall Recall of Precautions/Restrictions: Intact Precaution/Restrictions Comments: port, dyspnea, pleurX drain Restrictions Weight Bearing Restrictions Per  Provider Order: No     Mobility  Bed Mobility Overal bed mobility: Needs Assistance Bed Mobility: Supine to Sit     Supine to sit: Min assist, Used rails, HOB elevated     General bed mobility comments: Pt does well mobilizing B LE EOB, requests hand held assist to pull trunk upright, difficulty rolling onto L side due to drain.    Transfers Overall transfer level: Needs assistance Equipment used: Rolling walker (2 wheels), None Transfers: Sit to/from Stand Sit to Stand: Contact guard assist           General transfer comment: Pt initially performs STS without AD, but immediately reaches for IV pole in standing for balance. Mechanics improve with walker, good eccentric control when returning to sitting despite fatigue.    Ambulation/Gait Ambulation/Gait assistance: Contact guard assist Gait Distance (Feet): 50 Feet (50'x2) Assistive device: Rolling walker (2 wheels) Gait Pattern/deviations: Step-through pattern, Decreased stride length, Trunk flexed, Narrow base of support   Gait velocity interpretation: 1.31 - 2.62 ft/sec, indicative of limited community ambulator   General Gait Details: Multiple brief standing rest breaks taken to manage SOB. Pt's IV site begins bleeding during ambulation, RN assists with managing bleeding short term due to dripping in hallway. Pt demonstrates good management of walker but fatigues quickly.   Stairs             Wheelchair Mobility     Tilt Bed    Modified Rankin (Stroke Patients Only)       Balance Overall balance assessment: Needs assistance Sitting-balance support: No upper extremity supported, Feet supported Sitting balance-Leahy Scale: Good Sitting balance - Comments: No sitting balance concerns.   Standing balance  support: Bilateral upper extremity supported Standing balance-Leahy Scale: Fair Standing balance comment: Pt relies on walker and light external support for balance. No overt LOB or knee buckling  occurs. Pt benefits from DME due to SOB and fatigue.                            Communication Communication Communication: No apparent difficulties  Cognition Arousal: Alert Behavior During Therapy: WFL for tasks assessed/performed   PT - Cognitive impairments: No apparent impairments                         Following commands: Intact      Cueing Cueing Techniques: Verbal cues, Tactile cues, Visual cues  Exercises      General Comments General comments (skin integrity, edema, etc.): VSS throughout on 2L O2. Bleeding at IV site, RN notified. No additional skin abnormalities noted.      Pertinent Vitals/Pain Pain Assessment Pain Assessment: No/denies pain    Home Living                          Prior Function            PT Goals (current goals can now be found in the care plan section) Acute Rehab PT Goals Patient Stated Goal: to improve activity tolerance PT Goal Formulation: With patient Time For Goal Achievement: 07/22/24 Potential to Achieve Goals: Fair Additional Goals Additional Goal #1: Pt will report 3/10 DOE or less when ambulating for >150' to demonstrate improved tolerance for household mobility (Currently rating 6-7/10 with household distance ambulation) Progress towards PT goals: Progressing toward goals    Frequency    Min 2X/week      PT Plan      Co-evaluation              AM-PAC PT 6 Clicks Mobility   Outcome Measure  Help needed turning from your back to your side while in a flat bed without using bedrails?: A Little Help needed moving from lying on your back to sitting on the side of a flat bed without using bedrails?: A Little Help needed moving to and from a bed to a chair (including a wheelchair)?: A Little Help needed standing up from a chair using your arms (e.g., wheelchair or bedside chair)?: A Little Help needed to walk in hospital room?: A Little Help needed climbing 3-5 steps with a  railing? : A Lot 6 Click Score: 17    End of Session Equipment Utilized During Treatment: Oxygen  Activity Tolerance: Patient limited by fatigue (Pt limited by SOB) Patient left: in bed;with call bell/phone within reach;with bed alarm set Nurse Communication: Mobility status PT Visit Diagnosis: Unsteadiness on feet (R26.81);Muscle weakness (generalized) (M62.81)     Time: 0940-1006 PT Time Calculation (min) (ACUTE ONLY): 26 min  Charges:    $Gait Training: 8-22 mins $Therapeutic Activity: 8-22 mins PT General Charges $$ ACUTE PT VISIT: 1 Visit                     Sabra Morel, PT, DPT  Acute Rehabilitation Services         Office: [phone]      Sabra MARLA Morel 07/13/2024, 11:12 AM

## 2024-07-13 NOTE — Consult Note (Signed)
 "  Cardiology Consultation   Patient ID: DAVONE SHINAULT MRN: [id number]; DOB: Jan 05, 1951  Admit date: 07/06/2024 Date of Consult: 07/13/2024  PCP:  Dettinger, [name], MD    HeartCare Providers Cardiologist:  Lynwood Schilling, MD      Patient Profile: KENI ELISON is a 74 y.o. male with a hx of hypertension, hyperlipidemia, COPD, tobacco use, carotid artery stenosis s/p right CEA, stage IIIB lung cancer, prior history of tobacco use (40-pack-year), paroxysmal atrial fibrillation, heart failure with moderately reduced LVEF, left bundle branch block, and CAD managed medically and who is being seen 07/13/2024 for the evaluation of cardiomyopathy at the request of Yetta Councilman MD.  History of Present Illness: Mr. Mckim is a 74 year old male with prior cardiac history mentioned below.  Patient had a cardiac catheterization on 07/2017 that showed a chronic total occlusion of the mid RCA with left and right collaterals, mid to distal nonobstructive stenosis in the LAD, and moderate stenosis in the proximal circumflex.  A nuclear stress test with Lexiscan  was done on 12/2023 because of concerns for chest pain and showed a prior inferior infarction, decreased LVEF of 45%, the study was intermediate risk based on the reduced LVEF.  TTE was done on 04/2024 to assess shortness of breath and showed an LVEF of 40 to 45%, wall motion dyssynchrony from bundle branch block, small pericardial effusion, mild to moderate MR, and grossly normal aortic valve function.  The patient wore a ZIO monitor to assess A-fib burden at about this time that showed atrial fibrillation lasting as long as 2 hours and 15 minutes, and runs of NSVT lasting as long as 17.3 seconds.  On 06/25/2024 the patient was hospitalized for at [facility] for pneumonia, and acute hypoxic respiratory failure.  The patient was initially in A-fib with RVR but later converted to sinus rhythm.  Because of this the patient was started on IV  amiodarone  and discharged home on oral amiodarone   Patient has been followed by oncology at [facility] for stage IIIB lung cancer.  He has received radiation therapy and on 05/2024 was started on weekly chemotherapy with Carboplatin and Paclitaxel.  Because of the patient's hospitalization the chemotherapy was placed on hold.  Patient presented to [facility] for concerns of dyspnea.  Pulmonary CTA was negative for PE but found a right pleural effusion.  Because of increased work of breathing and hypotension the patient was transferred to [facility] on 07/07/2023.  The patient was placed on Levophed  but this was able to be discontinued on 07/11/2024.  He remains on midodrine .  The patient's blood pressure continues to remain soft.  The patient was diagnosed with pneumonia and was placed on Zosyn .  The cause of the patient's shock was felt to be possibly septic and cardiogenic  On interview the patient reported that he initially presented to the hospital for concerns of worsening shortness of breath and weakness.  Stated his weakness and dyspnea on exertion have been worsening for the past month.  He can walk around his house using a cane but cannot walk up to the second floor.  Denies any chest pain, lower extremity edema.  Reported having chronic orthopnea.  Denies alcohol use, ongoing tobacco use, illicit substance use.  Has a 40+ pack-year history of cigarette use but quit 6 months to a year ago.   Labs showed potassium of 4.1, creatinine of 1.1, BUN of 13, magnesium  of 2.1, WBC count of 3.0, and anemia with a hemoglobin of 5.8.  EKG showed sinus tachycardia with a rate of 109, and a left bundle branch block  TTE on 07/08/2024 showed a reduced LVEF of 25%, global hypokinesis, normal RV systolic function, mild MR, poorly visualized aortic valve, IVC with greater than 50% respiratory variability, and was a difficult study.  Chest x-ray on 07/09/2024 showed small residual right-sided pleural effusion  that was much improved.   Past Medical History:  Diagnosis Date   Arthritis    Carotid artery stenosis without cerebral infarction    vascular --- dr eliza;   09-24-2007  s/p right CEA   Chronic cholecystitis    w/  large calcified stone in gallbladder   Chronic constipation    COPD (chronic obstructive pulmonary disease) (HCC)    followed by pcp----  (05-28-2021  pt stated last used rescue 05-27-2021 for wheezing resolved after use, stated exacerbation approx  greater than a year ago)   Coronary artery disease 2002   cardiologist-- dr lavona ;  (05-28-2021  pt denies need to use nitro for CP) per last cath 08-04-2017  LM luminal irregularities, LAD 30% ,D1 ostial 35% ,CFx   AV groove ostial 25% , OM1 long prox 30%,  proxRCA occluded and dominant,   excellent left to right collateral filling.   Depression    Patient denies   DOE (dyspnea on exertion)    due to smoking/ copd   Dyslipidemia    Essential hypertension    Full dentures    GERD (gastroesophageal reflux disease)    History of adenomatous polyp of colon    History of COVID-19    per pt winter 2022, mild to moderate symptoms that resolved   History of diverticulitis of colon    History of kidney stones    Hyperlipidemia    Hypothyroidism    followed by pcp   Lung nodule    s/p bronchoscopy w/ bx in 2005,  benign   Osteoporosis    Smokers' cough (HCC)    05-28-2021  pt stated occasionally productive w/ white phlegm   Wears glasses     Past Surgical History:  Procedure Laterality Date   BRONCHIAL BIOPSY  04/25/2024   Procedure: BRONCHOSCOPY, WITH BIOPSY;  Surgeon: Shelah Lamar RAMAN, MD;  Location: [facility] ENDOSCOPY;  Service: Pulmonary;;   BRONCHIAL BRUSHINGS  04/25/2024   Procedure: BRONCHOSCOPY, WITH BRUSH BIOPSY;  Surgeon: Shelah Lamar RAMAN, MD;  Location: [facility]OS[facility];  Service: Pulmonary;;   BRONCHIAL NEEDLE ASPIRATION BIOPSY  04/25/2024   Procedure: BRONCHOSCOPY, WITH NEEDLE ASPIRATION BIOPSY;  Surgeon: Shelah Lamar RAMAN, MD;  Location: [facility] ENDOSCOPY;  Service: Pulmonary;;   BRONCHOSCOPY  2005   CARDIAC CATHETERIZATION  08/11/2000   @[facility]  by dr morris;   preserved LVF,  ostial RCA 70-75%, other nonobstructive disease   CARDIAC CATHETERIZATION  03/30/2008   @[facility]  by dr hochrein;  occluded RCA and other nonobstructive involving LAD, CFx, D1,OM1  and left to right collateral filling,  mild inferior hypokinesis,  ef 55%   CAROTID ENDARTERECTOMY Right 09/24/2007   @[facility]  by dr eliza   COLONOSCOPY  01/19/2017   by armsbruster   EXCISION NEUROMA Left 10/01/2005   @[facility] ;  left inguinal   EXTRACORPOREAL SHOCK WAVE LITHOTRIPSY  2015   INGUINAL HERNIA REPAIR Left    01-13-2003 and 07-22-2006 both @[facility]    IR TUNNELED CENTRAL VENOUS CATH PLC W IMG  07/12/2024   LAPAROSCOPIC CHOLECYSTECTOMY SINGLE SITE WITH INTRAOPERATIVE CHOLANGIOGRAM N/A 05/30/2021   Procedure: LAPAROSCOPIC CHOLECYSTECTOMY SINGLE SITE WITH INTRAOPERATIVE CHOLANGIOGRAM;  Surgeon: Sheldon,  Elspeth, MD;  Location: [facility];  Service: General;  Laterality: N/A;   LAPAROSCOPIC INGUINAL HERNIA REPAIR Bilateral 04/28/2007   @[facility]    LEFT HEART CATH AND CORONARY ANGIOGRAPHY N/A 08/04/2017   Procedure: LEFT HEART CATH AND CORONARY ANGIOGRAPHY;  Surgeon: Verlin Lonni BIRCH, MD;  Location: [facility] INVASIVE CV LAB;  Service: Cardiovascular;  Laterality: N/A;   THROAT SURGERY  1997   pt stated removal of tumor that was benign   VIDEO BRONCHOSCOPY WITH ENDOBRONCHIAL ULTRASOUND Right 04/25/2024   Procedure: BRONCHOSCOPY, WITH EBUS;  Surgeon: Shelah Lamar RAMAN, MD;  Location: [facility] ENDOSCOPY;  Service: Pulmonary;  Laterality: Right;  Right hilar mass and suspected  lymph node involvement     Home Medications:  Prior to Admission medications  Medication Sig Start Date End Date Taking? Authorizing Provider  albuterol  (PROVENTIL ) (2.5 MG/3ML) 0.083% nebulizer solution INHALE ONE vial by NEBULIZER EVERY 6 HOURS AS NEEDED FOR WHEEZING OR SHORTNESS OF BREATH 07/01/24  Yes  Dettinger, [name], MD  albuterol  (VENTOLIN  HFA) 108 (90 Base) MCG/ACT inhaler Inhale 2 puffs into the lungs every 6 (six) hours as needed for wheezing or shortness of breath. 07/02/24  Yes [provider]  ALPRAZolam  (XANAX ) 0.5 MG tablet Take 0.5 mg by mouth at bedtime as needed for sleep or anxiety. 06/24/24  Yes [provider]  amiodarone  (PACERONE ) 200 MG tablet Take 400 mg by mouth 2 (two) times daily. Take 2 tablets (400 mg total) by mouth 2 (two) times a day with meals for 5 days, THEN 1 tablet (200 mg total) 2 (two) times a day with meals for 7 days, THEN 1 tablet (200 mg total) daily.   Yes [provider]  Bempedoic Acid -Ezetimibe  (NEXLIZET ) 180-10 MG TABS TAKE ONE TABLET DAILY 12/22/23  Yes Dettinger, [name], MD  bisoprolol  (ZEBETA ) 5 MG tablet Take 2.5 mg by mouth daily. 06/22/24  Yes [provider]  budesonide -glycopyrrolate -formoterol  (BREZTRI  AEROSPHERE) 160-9-4.8 MCG/ACT AERO inhaler Inhale 2 puffs into the lungs in the morning and at bedtime. 01/31/24  Yes Emokpae, Courage, MD  ELIQUIS  5 MG TABS tablet Take 5 mg by mouth 2 (two) times daily. 06/22/24  Yes [provider]  fluticasone  (FLONASE ) 50 MCG/ACT nasal spray Place 2 sprays into both nostrils daily. 08/10/23  Yes [facility] Morton Hummer, Nena, NP  HYDROcodone bit-homatropine (HYCODAN) 5-1.5 MG/5ML syrup Take 5 mLs by mouth 4 (four) times daily as needed for cough. 06/30/24  Yes [provider]  hydrOXYzine  (VISTARIL ) 25 MG capsule TAKE ONE CAPSULE EVERY 8 HOURS AS NEEDED 05/12/24  Yes Dettinger, [name], MD  levothyroxine  (SYNTHROID ) 50 MCG tablet Take 1 tablet (50 mcg total) by mouth daily before breakfast. 01/31/24  Yes Emokpae, Courage, MD  nitroGLYCERIN  (NITROSTAT ) 0.4 MG SL tablet Place 1 tablet (0.4 mg total) under the tongue every 5 (five) minutes as needed for chest pain. 02/26/24 05/30/97 Yes Miriam Norris, NP  omeprazole  (PRILOSEC) 20 MG capsule Take 1 capsule (20 mg total)  by mouth daily. 01/31/24  Yes Pearlean Manus, MD  ondansetron  (ZOFRAN ) 8 MG tablet Take 8 mg by mouth every 8 (eight) hours as needed for vomiting. 06/06/24  Yes [provider]  prochlorperazine (COMPAZINE) 10 MG tablet Take 10 mg by mouth every 8 (eight) hours as needed for nausea or vomiting. 06/06/24  Yes [provider]  traZODone  (DESYREL ) 100 MG tablet Take 1 tablet (100 mg total) by mouth at bedtime. 02/11/24  Yes Dettinger, [name], MD  cefdinir  (OMNICEF ) 300 MG  capsule Take 300 mg by mouth 2 (two) times daily. Patient not taking: Reported on 07/07/2024 06/28/24   [provider]  doxycycline  (VIBRA -TABS) 100 MG tablet Take 100 mg by mouth 2 (two) times daily. Patient not taking: Reported on 07/07/2024 06/28/24   [provider]  OXYGEN  Inhale 2 L into the lungs daily.    [provider]  valsartan  (DIOVAN ) 80 MG tablet Take 80 mg by mouth daily. Patient not taking: Reported on 07/07/2024 05/13/24   [provider]    Scheduled Meds:  amiodarone   200 mg Oral Daily   apixaban   5 mg Oral BID   arformoterol   15 mcg Nebulization BID   budesonide  (PULMICORT ) nebulizer solution  0.5 mg Nebulization BID   Chlorhexidine  Gluconate Cloth  6 each Topical Daily   fluticasone   1 spray Each Nare Daily   levothyroxine   50 mcg Oral Q0600   midodrine   10 mg Oral Q8H   pantoprazole  (PROTONIX ) IV  40 mg Intravenous QHS   polyethylene glycol  17 g Oral Daily   revefenacin   175 mcg Nebulization Daily   senna-docusate  1 tablet Oral BID   traZODone   100 mg Oral QHS   Continuous Infusions:  heparin      piperacillin -tazobactam (ZOSYN )  IV 3.375 g (07/13/24 1054)   PRN Meds: acetaminophen , ALPRAZolam , guaiFENesin -codeine , ipratropium-albuterol , mouth rinse, polyethylene glycol, senna, sodium chloride  flush  Allergies:   Allergies[1]  Social History:   Social History   Socioeconomic History   Marital status: Married    Spouse name: [name]    Number of children: 4   Years of education: high school diploma   Highest education level: 12th grade  Occupational History   Occupation: truck Air Traffic Controller: [employer]  Tobacco Use   Smoking status: Former    Current packs/day: 0.00    Average packs/day: 1 pack/day for 58.6 years (58.9 ttl pk-yrs)    Types: Cigarettes    Start date: 2025    Quit date: 01/29/2024    Years since quitting: 0.4   Smokeless tobacco: Never   Tobacco comments:    Quit smoking mid September 2025  Vaping Use   Vaping status: Never Used  Substance and Sexual Activity   Alcohol use: Not Currently   Drug use: No   Sexual activity: Yes    Birth control/protection: None  Other Topics Concern   Not on file  Social History Narrative   Lives with wife in a split level home.    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   Social Drivers of Health   Tobacco Use: Medium Risk (07/10/2024)   Patient History    Smoking Tobacco Use: Former    Smokeless Tobacco Use: Never    Passive Exposure: Not on file  Financial Resource Strain: Low Risk (06/26/2024)   Received from [facility]   Overall Financial Resource Strain (CARDIA)    How hard is it for you to pay for the very basics like food, housing, medical care, and heating?: Not hard at all  Food Insecurity: No Food Insecurity (07/11/2024)   Epic    Worried About Radiation Protection Practitioner of Food in the Last Year: Never true    Ran Out of Food in the Last Year: Never true  Transportation Needs: Unknown (07/11/2024)  Holiday Representative (Medical): Not on file    Lack of Transportation (Non-Medical): No  Physical Activity: Inactive (05/02/2024)   Received from [facility]   Exercise Vital Sign    On average, how many days per week do you engage in moderate to strenuous exercise (like a brisk  walk)?: 0 days    On average, how many minutes do you engage in exercise at this level?: 0 min  Stress: No Stress Concern Present (05/02/2024)   Received from [facility] - Occupational Stress Questionnaire    Do you feel stress - tense, restless, nervous, or anxious, or unable to sleep at night because your mind is troubled all the time - these days?: Not at all  Social Connections: Socially Integrated (07/11/2024)   Social Connection and Isolation Panel    Frequency of Communication with Friends and Family: More than three times a week    Frequency of Social Gatherings with Friends and Family: Twice a week    Attends Religious Services: More than 4 times per year    Active Member of [employer] or Organizations: Yes    Attends Banker Meetings: 1 to 4 times per year    Marital Status: Married  Catering Manager Violence: Not At Risk (07/11/2024)   Epic    Fear of Current or Ex-Partner: No    Emotionally Abused: No    Physically Abused: No    Sexually Abused: No  Depression (PHQ2-9): Low Risk (06/29/2024)   Depression (PHQ2-9)    PHQ-2 Score: 0  Alcohol Screen: Low Risk (04/01/2023)   Alcohol Screen    Last Alcohol Screening Score (AUDIT): 0  Housing: Low Risk (07/11/2024)   Epic    Unable to Pay for Housing in the Last Year: No    Number of Times Moved in the Last Year: 0    Homeless in the Last Year: No  Utilities: Not At Risk (07/11/2024)   Epic    Threatened with loss of utilities: No  Health Literacy: Low Risk (05/02/2024)   Received from [facility] Literacy    How often do you need to have someone help you when you read instructions, pamphlets, or other written material from your doctor or pharmacy?: Never    Family History:    Family History  Problem Relation Age of Onset   Lupus Mother    Lung disease Father    Lupus Brother    Alcohol abuse Brother    Lung disease Brother    Colon polyps Brother     Colon cancer Brother 48   Lung disease Brother    Healthy Daughter    Healthy Son    Healthy Son    Healthy Son    Esophageal cancer Neg Hx    Rectal cancer Neg Hx    Stomach cancer Neg Hx    Pancreatic cancer Neg Hx    Crohn's disease Neg Hx    Ulcerative colitis Neg Hx      ROS:  Please see the history of present illness.   All other ROS reviewed and negative.     Physical Exam/Data: Vitals:   07/13/24 0004 07/13/24 0327 07/13/24 0700 07/13/24 1114  BP: (!) 103/45 104/61 (!) 100/55 104/66  Pulse: 90 87  94  Resp: 15 (!) 22  (!) 22  Temp: 98.2 F (36.8 C) (!) 97.3 F (36.3 C) (!) 97.4 F (36.3  C) 97.6 F (36.4 C)  TempSrc: Oral Oral Oral Oral  SpO2: 99% 100%  98%  Weight:      Height:        Intake/Output Summary (Last 24 hours) at 07/13/2024 1227 Last data filed at 07/13/2024 1100 Gross per 24 hour  Intake 207.94 ml  Output 1125 ml  Net -917.06 ml      07/12/2024    5:00 AM 07/11/2024    4:13 AM 07/10/2024    5:00 AM  Last 3 Weights  Weight (lbs) 121 lb 7.6 oz 122 lb 12.7 oz 119 lb 11.4 oz  Weight (kg) 55.1 kg 55.7 kg 54.3 kg     Body mass index is 17.43 kg/m.  General:   Frail elderly male appearing older than stated age in no acute distress on 4 L nasal cannula.  Is chronically on 2 L at home. HEENT: normal Neck: no JVD Vascular: No carotid bruits; Distal pulses 2+ bilaterally Cardiac:  normal S1, S2; RRR; no murmur  Lungs:  clear to auscultation bilaterally, no wheezing, rhonchi or rales  Abd: soft, nontender, no hepatomegaly  Ext: no edema Musculoskeletal:  No deformities Skin: warm and dry  Neuro:  no focal abnormalities noted Psych:  Normal affect   EKG:  The EKG was personally reviewed and demonstrates:  EKG showed sinus tachycardia with a rate of 109, and a left bundle branch block Telemetry:  Telemetry was personally reviewed and demonstrates: Normal sinus rhythm with heart rates in the 90s to 100s.  Relevant CV Studies:  TTE on  07/08/2024 IMPRESSIONS     1. Left ventricular ejection fraction, by estimation, is 25%. The left  ventricle has severely decreased function. The left ventricle demonstrates  global hypokinesis. Indeterminate diastolic filling due to E-A fusion.   2. Right ventricular systolic function is normal. The right ventricular  size is normal.   3. The mitral valve is normal in structure. Mild mitral valve  regurgitation.   4. AV is not visualized . No signficant gradient through the valve.  Aortic valve regurgitation is not visualized.   5. The inferior vena cava is normal in size with greater than 50%  respiratory variability, suggesting right atrial pressure of 3 mmHg.   6. Poor acoustic windows limit study.   Laboratory Data: High Sensitivity Troponin:  No results for input(s): TROPONINIHS in the last 720 hours. No results for input(s): TRNPT in the last 720 hours.    Chemistry Recent Labs  Lab 07/10/24 0447 07/11/24 0409 07/12/24 0224  NA 134* 135 133*  K 4.6 3.5 4.1  CL 99 96* 97*  CO2 28 32 29  GLUCOSE 117* 99 86  BUN 10 12 13   CREATININE 1.02 1.16 1.10  CALCIUM  8.0* 7.9* 7.6*  MG 2.1 1.8 2.1  GFRNONAA >60 >60 >60  ANIONGAP 7 7 8     No results for input(s): PROT, ALBUMIN , AST, ALT, ALKPHOS, BILITOT in the last 168 hours. Lipids No results for input(s): CHOL, TRIG, HDL, LABVLDL, LDLCALC, CHOLHDL in the last 168 hours.  Hematology Recent Labs  Lab 07/11/24 0409 07/12/24 0224 07/13/24 0334  WBC 2.2* 2.7* 3.0*  RBC 3.69* 2.93* 2.96*  HGB 10.5* 8.4* 8.5*  HCT 32.2* 26.0* 26.7*  MCV 87.3 88.7 90.2  MCH 28.5 28.7 28.7  MCHC 32.6 32.3 31.8  RDW 15.9* 15.8* 16.1*  PLT 161 175 180   Thyroid  No results for input(s): TSH, FREET4 in the last 168 hours.  BNPNo results for input(s): BNP, PROBNP in  the last 168 hours.  DDimer No results for input(s): DDIMER in the last 168 hours.  Radiology/Studies:  IR LEWIE LUND CATH W/IMG Result  Date: 07/12/2024 INDICATION: Lung cancer, malignant recurrent right pleural effusion, shortness of breath EXAM: ULTRASOUND FLUOROSCOPIC RIGHT CHEST TUNNELED PLEURAL CATHETER (PLEURX) Date:  07/12/2024 07/12/2024 12:55 pm Radiologist:  CHRISTELLA. Frederic Specking, MD Guidance:  ULTRASOUND AND FLUOROSCOPIC FLUOROSCOPY: 0 minutes 6 seconds (1 mGy). MEDICATIONS: 2 G ANCEF  ANESTHESIA/SEDATION: Moderate (conscious) sedation was employed during this procedure. A total of Versed  1.0 mg and Fentanyl  25 mcg was administered intravenously. Moderate Sedation Time: 12 minutes. The patient's level of consciousness and vital signs were monitored continuously by radiology nursing throughout the procedure under my direct supervision. CONTRAST:  NONE. COMPLICATIONS: None immediate. PROCEDURE: Informed consent was obtained from the patient following explanation of the procedure, risks, benefits and alternatives. The patient understands, agrees and consents for the procedure. All questions were addressed. A time out was performed. Maximal barrier sterile technique utilized including caps, mask, sterile gowns, sterile gloves, large sterile drape, hand hygiene, and ChloraPrep. Previous imaging reviewed. The right pleural effusion was localized in the mid axillary line through a lower intercostal space. Overlying skin marked for access. Under sterile conditions and local anesthesia, ultrasound percutaneous needle access performed of the effusion on the right side. Needle position confirmed with ultrasound. Images obtained for documentation. There was return of clear pleural fluid. Guidewire inserted followed by tract dilatation insert a peel-away sheath. The PleurX catheter was tunneled subcutaneously under sterile conditions and local anesthesia to the entry site and advanced through the valve peel-away sheath into the right chest cavity. Position confirmed with ultrasound and fluoroscopy. 700 cc thoracentesis performed. Catheter secured with a silk  suture. Entry site closed with Dermabond. Sterile dressing applied. No immediate complication. Patient tolerated the procedure well. IMPRESSION: Successful ultrasound and fluoroscopic guided insertion of a tunneled pleural catheter on the right. (Right chest PleurX catheter) 700 cc thoracentesis Electronically Signed   By: CHRISTELLA.  Shick M.D.   On: 07/12/2024 13:10   DG CHEST PORT 1 VIEW Result Date: 07/09/2024 CLINICAL DATA:  Status post thoracentesis. EXAM: PORTABLE CHEST 1 VIEW COMPARISON:  07/08/2024 FINDINGS: Interval evacuation of the right-sided pleural effusion with a very small residual effusion and overall much improved aeration of the right lung with resolution atelectasis. Chronic underlying pulmonary scarring changes. No postprocedural pneumothorax. The cardiac silhouette, mediastinal and hilar contours are stable. The left IJ Port-A-Cath is stable. IMPRESSION: Interval evacuation of right-sided pleural effusion with a very small residual effusion and overall much improved aeration of the right lung. No postprocedural pneumothorax. Electronically Signed   By: MYRTIS Stammer M.D.   On: 07/09/2024 16:18     Assessment and Plan: ERION WEIGHTMAN is a 74 y.o. male with a hx of hypertension, hyperlipidemia, COPD, tobacco use, carotid artery stenosis s/p right CEA, stage IIIB lung cancer, prior history of tobacco use (40-pack-year), paroxysmal atrial fibrillation, heart failure with moderately reduced LVEF, left bundle branch block, and CAD managed medically and who is being seen 07/13/2024 for the evaluation of cardiomyopathy at the request of Yetta Councilman MD.   Acute hypoxic respiratory failure -resolved Shock -resolved Hypotension Pneumonia Patient presented to [facility] for weakness and worsening shortness of breath.  Because of hypotension and increased work of breathing the patient was transferred to [facility].  Chest x-ray and pulmonary CTA at [facility] indicated the patient had  pneumonia and was placed on Zosyn .   Acute on chronic  HFrEF Prior TTE on 04/2024 showed an LVEF of 40 to 45% TTE on 07/08/2024 showed a reduced LVEF of 25%, global hypokinesis, normal RV systolic function, mild MR, poorly visualized aortic valve, IVC with greater than 50% respiratory variability, and was a difficult study. Prior to admission was on bisoprolol . Patient's worsening LVEF is in the setting of acute hypoxic respiratory failure, pneumonia, and shock.  The worsening LVEF may also be provoked by the left bundle branch block and chemotherapy. Labs showed creatinine of 0.99. Patient appears euvolemic on exam. Plan to reassess LVEF in 6 weeks. If remains reduced may consider outpatient ischemic evaluation.  GDMT -limited by ongoing hypotension requiring midodrine . May consider starting Jardiance 10 mg daily.   Multivessel CAD Hyperlipidemia Patient had a cardiac catheterization on 07/2017 that showed a chronic total occlusion of the mid RCA with left and right collaterals, mid to distal nonobstructive stenosis in the LAD, and moderate stenosis in the proximal circumflex.  This is managed medically. A nuclear stress test with Lexiscan  was done on 12/2023 because of concerns for chest pain and showed a prior inferior infarction, decreased LVEF of 45%, the study was intermediate risk based on the reduced LVEF. Denies any chest pain or chest tightness. High-sensitivity troponins 33 > 36 (negative) at [facility]. Not consistent with ACS. May restart home Nexlizet .    Left bundle branch block Patient has had an intermittent left bundle branch block in the past.  On telemetry and EKG patient currently has a left bundle branch block. May consider outpatient consult with EP to assess if patient is a candidate for CRT and possible ICD if LVEF remains reduced.  Suspect patient may not be a candidate given his lung cancer.   Paroxysmal atrial fibrillation CHA2DS2-VASc Score = 3 [CHF History: 0, HTN  History: 1, Diabetes History: 0, Stroke History: 0, Vascular Disease History: 1, Age Score: 1, Gender Score: 0].  Therefore, the patient's annual risk of stroke is 3.2 %.    Labs showed potassium of 4.1, creatinine of 1.1, magnesium  of 2.1,  Denies any melena, hematuria, hematochezia. Continue Eliquis  5 mg twice daily. Continue amiodarone  200 mg daily.   Stage IIIB lung cancer Patient is followed by oncology at [facility] for stage IIIb lung cancer.  On 05/2024 he was started on chemotherapy with with Carboplatin and Paclitaxel.  The patient is also receiving radiation therapy. Management per oncology and primary.   Otherwise management per primary   Risk Assessment/Risk Scores:     New York  Heart Association (NYHA) Functional Class NYHA Class III  CHA2DS2-VASc Score = 3   This indicates a 3.2% annual risk of stroke. The patient's score is based upon: CHF History: 0 HTN History: 1 Diabetes History: 0 Stroke History: 0 Vascular Disease History: 1 Age Score: 1 Gender Score: 0      For questions or updates, please contact  [facility] Please consult [email or web address] for contact info under     Signed, Morse Clause, PA-C  07/13/2024 12:27 PM     [1]  Allergies Allergen Reactions   Flomax  [Tamsulosin  Hcl] Other (See Comments)    Made patient hypotensive, clammy, sweaty, and feel like he was going to pass out (EMS had to be called)   Crestor [Rosuvastatin Calcium ] Other (See Comments)    Lethargy and muscle aches   Lipitor [Atorvastatin Calcium ] Other (See Comments)    Body cramps, lethargy, and muscle aches   "

## 2024-07-13 NOTE — Progress Notes (Signed)
 Met with patient and his wife for PleurX education. Both watched the PleurX patient education video. Patient's wife performed drainage procedure with a practice PleurX drain and drainage kit. She was able to perform the whole procedure with little prompting. A PleurX discharge kit is in the room with 4 drainage kits and a patient education booklit. I also provided a folder with a drainage log, instructions for drainage with pictures and a QR code to the patient education video.

## 2024-07-13 NOTE — Progress Notes (Signed)
 PHARMACY - ANTICOAGULATION CONSULT NOTE  Pharmacy Consult for heparin  Indication: atrial fibrillation  Labs: Recent Labs    07/10/24 0447 07/11/24 0408 07/11/24 0409 07/12/24 0224 07/13/24 0334  HGB 9.3*  --  10.5* 8.4* 8.5*  HCT 28.4*  --  32.2* 26.0* 26.7*  PLT 172  --  161 175 180  APTT 76*  --   --   --   --   LABPROT  --   --  13.5  --   --   INR  --   --  1.0  --   --   HEPARINUNFRC 0.33 0.30  --  0.27* 0.45  CREATININE 1.02  --  1.16 1.10  --    Assessment/Plan:  73yo male therapeutic on heparin  after resumed. Will continue infusion at current rate of 700 units/hr and confirm stable with additional level.  Marvetta Dauphin, PharmD, BCPS 07/13/2024 4:20 AM

## 2024-07-13 NOTE — Progress Notes (Signed)
" °   07/13/24 1200  Vitals  BP (!) 74/39  MAP (mmHg) (!) 46  Pulse Rate 92  ECG Heart Rate 92  Resp (!) 22  MEWS COLOR  MEWS Score Color Yellow  Oxygen  Therapy  SpO2 99 %  MEWS Score  MEWS Temp 0  MEWS Systolic 2  MEWS Pulse 0  MEWS RR 1  MEWS LOC 0  MEWS Score 3   MD Patel notified. Retake BP at 99/58. Pt denies any dizziness, chest pain, or increased SOB. Pt on continuous monitor. Scheduled midodrine  administered.  "

## 2024-07-13 NOTE — Progress Notes (Signed)
 [facility] Progress Note Patient: [name] [name] FMW:[id number] DOB: March 19, 1951  DOA: 07/06/2024 DOS: the patient was seen and examined on 07/13/2024  Brief Summary: Patient is a 74 y.o.  male with history of NSCLC-undergoing chemo/radiation at [facility]-presented to [facility] ER with shortness of breath-he was found to have a large right-sided pleural effusion-thoracocentesis was attempted but unsuccessful-he then got hypotensive following IV Lasix  infusion-he was started on norepinephrine -and transferred to [facility] ICU.   Significant events: 1/12 admit to [facility] R 1/14 transfer to [facility] 1/17 second thoracentesis, drained 1.4 L 1/18 started midodrine , levophed  is off 1/20>> transferred to [facility]   Significant studies: 1/16>> CXR: Increasing moderate right pleural effusion. 1/16>> echo: EF 25%   Significant microbiology data: 1/14>> body fluid culture: No growth 1/15>> blood culture: No growth   Procedures: 1/17>> thoracocentesis by PCCM   Consults: PCCM IR Cardiology  Assessment and Plan: Multifactorial shock Etiology thought to be septic/cardiogenic BP has stabilized with supportive care On midodrine -briefly required pressors while in the ICU Remains on antibiotics   Acute on chronic HFrEF BP too soft to initiate GDMT medications-on midodrine  for BP support Unlikely to be a candidate for advanced therapies given underlying malignancy. Cardiology consulted.  No further workup inpatient recommended.  Outpatient repeat echocardiogram recommended.   PNA Overall improved-continues to have mild leukopenia Zosyn -EOT 1/21 Completed Zithromax  x 3 days   Right-sided pleural effusion Likely malignant S/p thoracocentesis on 1/14 and 1/17 PleurX was placed by IR on 1/20. Currently drainage schedule Tuesday Thursday Saturday. Pulmonary will follow outpatient.   PAF Rate controlled IV heparin  till all procedures are complete Continue amiodarone    Known history of CAD Currently no  anginal symptoms   HTN Bisoprolol /valsartan  on hold   COPD Bronchodilators   Hypothyroidism Synthroid    Debility/deconditioning PT/OT eval-Home health recommended.   Pressure Ulcer: Stage I coccyx ulcer. Medial. Continue foam dressing.  Underweight. Body mass index is 17.43 kg/m.  Placing the patient at high risk for poor outcome.  Stage IIIb lung cancer. Currently on carboplatin paclitaxel.  Also receiving radiation therapy. Outpatient follow-up with [facility].  DVT Prophylaxis: SCDs Start: 07/06/24 1043 apixaban  (ELIQUIS ) tablet 5 mg    Data review I have Reviewed nursing notes, Vitals, and Lab results. Since last encounter, pertinent lab results CBC and BMP   . I have ordered test including CBC and BMP  . I have discussed pt's care plan and test results with cardiology and pulmonary  .   Family Communication: No one at bedside  Disposition Plan: Status is: Inpatient Remains inpatient appropriate because: Monitor for improvement in respiratory status  Planned Discharge Destination: SNF Diet: Diet Order             Diet Heart Room service appropriate? Yes; Fluid consistency: Thin; Fluid restriction: 1500 mL Fluid  Diet effective now                   MEDICATIONS: Scheduled Meds:  amiodarone   200 mg Oral Daily   apixaban   5 mg Oral BID   arformoterol   15 mcg Nebulization BID   budesonide  (PULMICORT ) nebulizer solution  0.5 mg Nebulization BID   Chlorhexidine  Gluconate Cloth  6 each Topical Daily   fluticasone   1 spray Each Nare Daily   levothyroxine   50 mcg Oral Q0600   midodrine   10 mg Oral TID WC   pantoprazole  (PROTONIX ) IV  40 mg Intravenous QHS   polyethylene glycol  17 g Oral Daily   revefenacin   175 mcg Nebulization  Daily   senna-docusate  1 tablet Oral BID   traZODone   100 mg Oral QHS   Continuous Infusions:  heparin  750 Units/hr (07/13/24 1420)   piperacillin -tazobactam (ZOSYN )  IV 3.375 g (07/13/24 1054)   PRN  Meds:.acetaminophen , ALPRAZolam , guaiFENesin -codeine , ipratropium-albuterol , mouth rinse, polyethylene glycol, senna, sodium chloride  flush  Author: Yetta Blanch, MD  [facility] 07/13/2024  6:40 PM Between 7PM-7AM, please contact night-coverage, check [email or web address] for on call.

## 2024-07-14 ENCOUNTER — Other Ambulatory Visit (HOSPITAL_COMMUNITY): Payer: Self-pay

## 2024-07-14 ENCOUNTER — Encounter: Payer: Self-pay | Admitting: Family Medicine

## 2024-07-14 DIAGNOSIS — R579 Shock, unspecified: Secondary | ICD-10-CM | POA: Diagnosis not present

## 2024-07-14 LAB — BASIC METABOLIC PANEL WITH GFR
Anion gap: 8 (ref 5–15)
BUN: 12 mg/dL (ref 8–23)
CO2: 27 mmol/L (ref 22–32)
Calcium: 7.8 mg/dL — ABNORMAL LOW (ref 8.9–10.3)
Chloride: 100 mmol/L (ref 98–111)
Creatinine, Ser: 0.93 mg/dL (ref 0.61–1.24)
GFR, Estimated: >60 mL/min
Glucose, Bld: 85 mg/dL (ref 70–99)
Potassium: 4.2 mmol/L (ref 3.5–5.1)
Sodium: 134 mmol/L — ABNORMAL LOW (ref 135–145)

## 2024-07-14 LAB — CBC
HCT: 27.1 % — ABNORMAL LOW (ref 39.0–52.0)
Hemoglobin: 8.9 g/dL — ABNORMAL LOW (ref 13.0–17.0)
MCH: 29.3 pg (ref 26.0–34.0)
MCHC: 32.8 g/dL (ref 30.0–36.0)
MCV: 89.1 fL (ref 80.0–100.0)
Platelets: 208 K/uL (ref 150–400)
RBC: 3.04 MIL/uL — ABNORMAL LOW (ref 4.22–5.81)
RDW: 16.8 % — ABNORMAL HIGH (ref 11.5–15.5)
WBC: 3.6 K/uL — ABNORMAL LOW (ref 4.0–10.5)
nRBC: 0 % (ref 0.0–0.2)

## 2024-07-14 LAB — MAGNESIUM: Magnesium: 1.8 mg/dL (ref 1.7–2.4)

## 2024-07-14 MED ORDER — MIDODRINE HCL 10 MG PO TABS
10.0000 mg | ORAL_TABLET | Freq: Three times a day (TID) | ORAL | 0 refills | Status: AC
  Filled 2024-07-14: qty 90, 30d supply, fill #0

## 2024-07-14 MED ORDER — HEPARIN SOD (PORK) LOCK FLUSH 100 UNIT/ML IV SOLN
500.0000 [IU] | INTRAVENOUS | Status: AC | PRN
  Administered 2024-07-14: 500 [IU]

## 2024-07-14 MED ORDER — AMIODARONE HCL 200 MG PO TABS
200.0000 mg | ORAL_TABLET | Freq: Every day | ORAL | 0 refills | Status: AC
  Filled 2024-07-14: qty 30, 30d supply, fill #0

## 2024-07-14 NOTE — Plan of Care (Signed)
" °  Problem: Education: Goal: Knowledge of General Education information will improve Description: Including pain rating scale, medication(s)/side effects and non-pharmacologic comfort measures Outcome: Completed/Met   Problem: Health Behavior/Discharge Planning: Goal: Ability to manage health-related needs will improve Outcome: Completed/Met   Problem: Clinical Measurements: Goal: Ability to maintain clinical measurements within normal limits will improve Outcome: Completed/Met Goal: Will remain free from infection Outcome: Completed/Met Goal: Diagnostic test results will improve Outcome: Completed/Met Goal: Respiratory complications will improve Outcome: Completed/Met Goal: Cardiovascular complication will be avoided Outcome: Completed/Met   Problem: Activity: Goal: Risk for activity intolerance will decrease Outcome: Completed/Met   Problem: Nutrition: Goal: Adequate nutrition will be maintained Outcome: Completed/Met   Problem: Coping: Goal: Level of anxiety will decrease Outcome: Completed/Met   Problem: Elimination: Goal: Will not experience complications related to bowel motility Outcome: Completed/Met Goal: Will not experience complications related to urinary retention Outcome: Completed/Met   Problem: Pain Managment: Goal: General experience of comfort will improve and/or be controlled Outcome: Completed/Met   Problem: Safety: Goal: Ability to remain free from injury will improve Outcome: Completed/Met   Problem: Skin Integrity: Goal: Risk for impaired skin integrity will decrease Outcome: Completed/Met   Problem: Education: Goal: Knowledge of the prescribed therapeutic regimen will improve Outcome: Completed/Met   Problem: Activity: Goal: Ability to perform activities at highest level will improve Outcome: Completed/Met   Problem: Bowel/Gastric: Goal: Occurrences of nausea will decrease Outcome: Completed/Met Goal: Bowel function will  improve Outcome: Completed/Met   Problem: Nutritional: Goal: Maintenance of adequate nutrition will improve Outcome: Completed/Met   Problem: Clinical Measurements: Goal: Will remain free from infection Outcome: Completed/Met   Problem: Skin Integrity: Goal: Status of oral mucous membranes will improve Outcome: Completed/Met   Problem: Education: Goal: Knowledge of the prescribed therapy will improve Outcome: Completed/Met   Problem: Bowel/Gastric: Goal: Occurrences of nausea, vomiting, and/or diarrhea will decrease Outcome: Completed/Met   Problem: Coping: Goal: Level of anxiety will decrease Outcome: Completed/Met Goal: Ability to identify and develop effective coping behavior will improve Outcome: Completed/Met   Problem: Nutritional: Goal: Will achieve and/or maintain adequate nutritional intake Outcome: Completed/Met   Problem: Skin Integrity: Goal: Risk for impaired skin integrity will decrease Outcome: Completed/Met   Problem: Urinary Elimination: Goal: Complications of radiation-induced cystitis will be minimized/avoided Outcome: Completed/Met   Problem: Fatigue: Goal: Expression of feelings of increased energy will improve Outcome: Completed/Met   Problem: Education: Goal: Knowledge of disease or condition will improve Outcome: Completed/Met Goal: Understanding of medication regimen will improve Outcome: Completed/Met Goal: Individualized Educational Video(s) Outcome: Completed/Met   Problem: Activity: Goal: Ability to tolerate increased activity will improve Outcome: Completed/Met   Problem: Cardiac: Goal: Ability to achieve and maintain adequate cardiopulmonary perfusion will improve Outcome: Completed/Met   Problem: Health Behavior/Discharge Planning: Goal: Ability to safely manage health-related needs after discharge will improve Outcome: Completed/Met   "

## 2024-07-14 NOTE — Progress Notes (Signed)
 Follow-up for PleurX education. Patient's wife was able to perform drainage procedure today on [name] with little prompting. Patient's daughter also participated and was able to observe the drainage procedure - she also watched the PleurX patient education video today. We went over when to call [facility] pulmonology - signs and symptoms of infection/problems with draining/cuff visible. All questions were answered. 13 drainage kits at bedside to go home with the patient.

## 2024-07-14 NOTE — Plan of Care (Signed)

## 2024-07-14 NOTE — TOC Transition Note (Addendum)
 Transition of Care [facility]) - Discharge Note   Patient Details  Name: [name] [name] MRN: [id number] Date of Birth: April 20, 1951  Transition of [facility] CM/SW Contact:  [name], RN Phone Number: 07/14/2024, 12:11 PM   Clinical Narrative:    Patient will be discharging to home today, with family providing transportation.   Patient and family were able to meet with PleurX educator again today, they report feeling comfortable with completing draining at home. PleurX kits are in room to go home with patient and family. Order sheet has been faxed to [facility].   HH arranged through [facility] for RN/PT/OT. Info on AVS.   DME rec-transport chair per therapy. Family reports they do want this. Transport chair ordered through [employer] and to be delivered to patient room before d/c.   Patient has home O2 through [facility]. Patient's family forgot to bring an O2 tank for transport home. [facility] to bring O2 tank to hospital room for discharge to home.    No further needs identified by CM.    Final next level of care: Home w Home Health Services Barriers to Discharge: No Barriers Identified   Patient Goals and CMS Choice   CMS Medicare.gov Compare Post Acute Care list provided to:: Patient Choice offered to / list presented to : Patient      Discharge Placement                       Discharge Plan and Services Additional resources added to the After Visit Summary for                  DME Arranged: Other see comment (transport chair) DME Agency: [employer] Date DME Agency Contacted: 07/14/24 Time DME Agency Contacted: 1211 Representative spoke with at DME Agency: [place] HH Arranged: RN, PT, OT [facility] Agency: [facility] Date [facility] Agency Contacted: 07/14/24 Time HH Agency Contacted: 1211 Representative spoke with at [facility] Agency: [place]  Social Drivers of Health (SDOH) Interventions SDOH Screenings   Food Insecurity: No Food Insecurity (07/11/2024)   Housing: Low Risk (07/11/2024)  Transportation Needs: Unknown (07/11/2024)  Utilities: Not At Risk (07/11/2024)  Alcohol Screen: Low Risk (04/01/2023)  Depression (PHQ2-9): Low Risk (06/29/2024)  Financial Resource Strain: Low Risk (06/26/2024)   Received from [facility]  Physical Activity: Inactive (05/02/2024)   Received from [facility]  Social Connections: Socially Integrated (07/11/2024)  Stress: No Stress Concern Present (05/02/2024)   Received from [facility]  Tobacco Use: Medium Risk (07/10/2024)  Health Literacy: Low Risk (05/02/2024)   Received from [facility]     Readmission Risk Interventions    07/07/2024    2:35 PM 01/30/2024    3:58 PM  Readmission Risk Prevention Plan  Post Dischage Appt  Complete  Medication Screening  Complete  Transportation Screening Complete Complete  PCP or Specialist Appt within 5-7 Days Complete   Home Care Screening Complete   Medication Review (RN CM) Referral to Pharmacy

## 2024-07-15 ENCOUNTER — Telehealth: Payer: Self-pay

## 2024-07-15 NOTE — Progress Notes (Signed)
 Patient discharged, Important Message Letter mailed to patient.

## 2024-07-15 NOTE — Care Management Important Message (Signed)
 Important Message  Patient Details  Name: [name] [name] MRN: [id number] Date of Birth: 19-Dec-1950   Important Message Given:  No     [name] 07/15/2024, 10:06 AM

## 2024-07-15 NOTE — Transitions of Care (Post Inpatient/ED Visit) (Signed)
" ° °  07/15/2024  Name: [name] [name] MRN: [id number] DOB: [birth year]  Today's TOC FU Call Status: Today's TOC FU Call Status:: Unsuccessful Call (1st Attempt) Unsuccessful Call (1st Attempt) Date: 07/15/24  Attempted to reach the patient regarding the most recent Inpatient/ED visit.  Left a HIPAA approved voicemail message to phone number provided in demographics per DPR.    Follow Up Plan: Additional outreach attempts will be made to reach the patient to complete the Transitions of Care (Post Inpatient/ED visit) call.   Richerd Fish, RN, BSN, CCM [facility], [facility] RN Care Manager Direct Dial: [phone]        "

## 2024-07-16 NOTE — Discharge Summary (Signed)
 " Physician Discharge Summary   Patient: [name] [name] MRN: [id number] DOB: 02/08/1951  Admit date:     07/06/2024  Discharge date: 07/14/2024  Discharge Physician: [name] [name]  PCP: [name], [name] LABOR, MD  Disposition: Home with home health Recommendations at discharge: Follow-up with PCP. Repeat CBC and BMP. Follow-up with cardiology. Follow-up with pulmonary. Follow-up with medical oncology.   Contact information for follow-up providers     [name], [name] LABOR, MD. Schedule an appointment as soon as possible for a visit in 1 week(s).   Specialties: Family Medicine, Cardiology Why: with BMP lab to look at kidney/electrolyte numbers, with CBC lab to look at blood counts, To discuss blood pressure meds Contact information: [address] [place] [address] [phone]         [facility] [place]. Go to.   Specialty: Cardiology Why: the Appointment As Scheduled Contact information: [address] [place]  [address] [phone]        [name] Cools, MD. Schedule an appointment as soon as possible for a visit in 2 week(s).   Specialties: Pulmonary Disease, Internal Medicine Why: with Repeat Chest X ray in 2 weeks Contact information: [address], [address] [place] [address] [phone]         [name] Mertie Raddle., MD. Schedule an appointment as soon as possible for a visit in 1 week(s).   Specialty: Medical Oncology Contact information: [address] [place] [address] [phone]              Contact information for after-discharge care     Home Medical Care     [facility]) .   Service: Home Health Services Contact information: [address] [place]  [address] [phone]                    Hospital Course: Patient is a 74 y.o.  male with history of NSCLC-undergoing chemo/radiation at [facility]-presented to [facility] ER with  shortness of breath-he was found to have a large right-sided pleural effusion-thoracocentesis was attempted but unsuccessful-he then got hypotensive following IV Lasix  infusion-he was started on norepinephrine -and transferred to [facility] ICU.   Significant events: 1/12 admit to [facility] 1/14 transfer to [facility] 1/17 second thoracentesis, drained 1.4 L 1/18 started midodrine , levophed  is off 1/20>> transferred to [facility]   Significant studies: 1/16>> CXR: Increasing moderate right pleural effusion. 1/16>> echo: EF 25%   Significant microbiology data: 1/14>> body fluid culture: No growth 1/15>> blood culture: No growth   Procedures: 1/17>> thoracocentesis by PCCM   Consults: PCCM IR Cardiology   Assessment and Plan: Multifactorial shock Etiology thought to be septic/cardiogenic BP has stabilized with supportive care On midodrine -briefly required pressors while in the ICU Remains on antibiotics   Acute on chronic HFrEF BP too soft to initiate GDMT medications-on midodrine  for BP support Unlikely to be a candidate for advanced therapies given underlying malignancy. Cardiology consulted.  No further workup inpatient recommended.  Outpatient repeat echocardiogram recommended.   PNA Overall improved-continues to have mild leukopenia Zosyn -EOT 1/21 Completed Zithromax  x 3 days   Right-sided pleural effusion Likely malignant S/p thoracocentesis on 1/14 and 1/17 PleurX was placed by IR on 1/20. Currently drainage schedule Tuesday Thursday Saturday. Pulmonary will follow outpatient.   PAF Rate controlled IV heparin  till all procedures are complete Continue amiodarone    Known history of CAD Currently no anginal symptoms   HTN Bisoprolol /valsartan  on hold  COPD Bronchodilators   Hypothyroidism Synthroid    Debility/deconditioning PT/OT eval-Home health recommended.   Pressure Ulcer: Stage I coccyx ulcer. Medial. Continue foam dressing.   Underweight. Body mass index is  16.92 kg/m.  Placing the patient at high risk for poor outcome.   Stage IIIb lung cancer. Currently on carboplatin paclitaxel.  Also receiving radiation therapy. Outpatient follow-up with [facility].   Family Communication: Plan was discussed with [name] pt provided permission to discuss medical plan with the family. Opportunity was given to ask question and all questions were answered satisfactorily.  Consultants:  Primary admission with ICU Cardiology IR  Procedures performed:  Bedside thoracentesis. guided PleurX catheter placement.  DISCHARGE MEDICATION: Allergies as of 07/14/2024       Reactions   Flomax  [tamsulosin  Hcl] Other (See Comments)   Made patient hypotensive, clammy, sweaty, and feel like he was going to pass out (EMS had to be called)   Crestor [rosuvastatin Calcium ] Other (See Comments)   Lethargy and muscle aches   Lipitor [atorvastatin Calcium ] Other (See Comments)   Body cramps, lethargy, and muscle aches        Medication List     STOP taking these medications    bisoprolol  5 MG tablet Commonly known as: ZEBETA    cefdinir  300 MG capsule Commonly known as: OMNICEF    doxycycline  100 MG tablet Commonly known as: VIBRA -TABS   valsartan  80 MG tablet Commonly known as: DIOVAN        TAKE these medications    albuterol  (2.5 MG/3ML) 0.083% nebulizer solution Commonly known as: PROVENTIL  INHALE ONE vial by NEBULIZER EVERY 6 HOURS AS NEEDED FOR WHEEZING OR SHORTNESS OF BREATH   albuterol  108 (90 Base) MCG/ACT inhaler Commonly known as: VENTOLIN  HFA Inhale 2 puffs into the lungs every 6 (six) hours as needed for wheezing or shortness of breath.   ALPRAZolam  0.5 MG tablet Commonly known as: XANAX  Take 0.5 mg by mouth at bedtime as needed for sleep or anxiety.   amiodarone  200 MG tablet Commonly known as: PACERONE  Take 1 tablet (200 mg total) by mouth daily. What changed:  how much to take when to take this additional instructions    Breztri  Aerosphere 160-9-4.8 MCG/ACT Aero inhaler Generic drug: budesonide -glycopyrrolate -formoterol  Inhale 2 puffs into the lungs in the morning and at bedtime.   Eliquis  5 MG Tabs tablet Generic drug: apixaban  Take 5 mg by mouth 2 (two) times daily.   fluticasone  50 MCG/ACT nasal spray Commonly known as: FLONASE  Place 2 sprays into both nostrils daily.   HYDROcodone bit-homatropine 5-1.5 MG/5ML syrup Commonly known as: HYCODAN Take 5 mLs by mouth 4 (four) times daily as needed for cough.   hydrOXYzine  25 MG capsule Commonly known as: VISTARIL  TAKE ONE CAPSULE EVERY 8 HOURS AS NEEDED   levothyroxine  50 MCG tablet Commonly known as: SYNTHROID  Take 1 tablet (50 mcg total) by mouth daily before breakfast.   midodrine  10 MG tablet Commonly known as: PROAMATINE  Take 1 tablet (10 mg total) by mouth 3 (three) times daily with meals. Call PCP if top blood pressure number remain high more than 140 to adjust the medicine.   Nexlizet  180-10 MG Tabs Generic drug: Bempedoic Acid -Ezetimibe  TAKE ONE TABLET DAILY   nitroGLYCERIN  0.4 MG SL tablet Commonly known as: NITROSTAT  Place 1 tablet (0.4 mg total) under the tongue every 5 (five) minutes as needed for chest pain.   omeprazole  20 MG capsule Commonly known as: PRILOSEC Take 1 capsule (20 mg total) by mouth daily.   ondansetron  8  MG tablet Commonly known as: ZOFRAN  Take 8 mg by mouth every 8 (eight) hours as needed for vomiting.   OXYGEN  Inhale 2 L into the lungs daily.   prochlorperazine 10 MG tablet Commonly known as: COMPAZINE Take 10 mg by mouth every 8 (eight) hours as needed for nausea or vomiting.   traZODone  100 MG tablet Commonly known as: DESYREL  Take 1 tablet (100 mg total) by mouth at bedtime.               Discharge Care Instructions  (From admission, onward)           Start     Ordered   07/14/24 0000  Discharge wound care:       Comments: Pleural drainage schedule to start Thursday, 1/22 .  Drain and record 3x weekly (Tuesday/Thursday/Saturday), up to max of 1L until patient is only able to drain out . If <173mL for 3 consecutive drains every other day, then call Interventional Radiology [phone]) for evaluation and possible removal.   07/14/24 1144            Diet recommendation: Regular diet  Discharge Exam: Vitals:   07/14/24 0031 07/14/24 0300 07/14/24 0500 07/14/24 0851  BP: 99/64 112/61  (!) 118/59  Pulse: 98   99  Resp: 17   20  Temp: (!) 97.5 F (36.4 C) (!) 97.2 F (36.2 C)  (!) 97.4 F (36.3 C)  TempSrc: Axillary Oral  Oral  SpO2: 97%   94%  Weight:   53.5 kg   Height:        Filed Weights   07/11/24 0413 07/12/24 0500 07/14/24 0500  Weight: 55.7 kg 55.1 kg 53.5 kg   General: in Mild distress, No Rash Cardiovascular: S1 and S2 Present, No Murmur Respiratory: Good respiratory effort, Bilateral Air entry present.  Basal crackles, No wheezes Abdomen: Bowel Sound present, No tenderness Extremities: No edema Neuro: Alert and oriented x3, no new focal deficit  Condition at discharge: stable  The results of significant diagnostics from this hospitalization (including imaging, microbiology, ancillary and laboratory) are listed below for reference.   Imaging Studies: IR TUNN PLEURX CATH W/IMG Result Date: 07/12/2024 INDICATION: Lung cancer, malignant recurrent right pleural effusion, shortness of breath EXAM: ULTRASOUND FLUOROSCOPIC RIGHT CHEST TUNNELED PLEURAL CATHETER (PLEURX) Date:  07/12/2024 07/12/2024 12:55 pm Radiologist:  CHRISTELLA. Frederic Specking, MD Guidance:  ULTRASOUND AND FLUOROSCOPIC FLUOROSCOPY: 0 minutes 6 seconds (1 mGy). MEDICATIONS: 2 G ANCEF  ANESTHESIA/SEDATION: Moderate (conscious) sedation was employed during this procedure. A total of Versed  1.0 mg and Fentanyl  25 mcg was administered intravenously. Moderate Sedation Time: 12 minutes. The patient's level of consciousness and vital signs were monitored continuously by radiology nursing  throughout the procedure under my direct supervision. CONTRAST:  NONE. COMPLICATIONS: None immediate. PROCEDURE: Informed consent was obtained from the patient following explanation of the procedure, risks, benefits and alternatives. The patient understands, agrees and consents for the procedure. All questions were addressed. A time out was performed. Maximal barrier sterile technique utilized including caps, mask, sterile gowns, sterile gloves, large sterile drape, hand hygiene, and ChloraPrep. Previous imaging reviewed. The right pleural effusion was localized in the mid axillary line through a lower intercostal space. Overlying skin marked for access. Under sterile conditions and local anesthesia, ultrasound percutaneous needle access performed of the effusion on the right side. Needle position confirmed with ultrasound. Images obtained for documentation. There was return of clear pleural fluid. Guidewire inserted followed by tract dilatation insert a peel-away sheath. The PleurX  catheter was tunneled subcutaneously under sterile conditions and local anesthesia to the entry site and advanced through the valve peel-away sheath into the right chest cavity. Position confirmed with ultrasound and fluoroscopy. 700 cc thoracentesis performed. Catheter secured with a silk suture. Entry site closed with Dermabond. Sterile dressing applied. No immediate complication. Patient tolerated the procedure well. IMPRESSION: Successful ultrasound and fluoroscopic guided insertion of a tunneled pleural catheter on the right. (Right chest PleurX catheter) 700 cc thoracentesis Electronically Signed   By: CHRISTELLA.  Shick M.D.   On: 07/12/2024 13:10   DG CHEST PORT 1 VIEW Result Date: 07/09/2024 CLINICAL DATA:  Status post thoracentesis. EXAM: PORTABLE CHEST 1 VIEW COMPARISON:  07/08/2024 FINDINGS: Interval evacuation of the right-sided pleural effusion with a very small residual effusion and overall much improved aeration of the right  lung with resolution atelectasis. Chronic underlying pulmonary scarring changes. No postprocedural pneumothorax. The cardiac silhouette, mediastinal and hilar contours are stable. The left IJ Port-A-Cath is stable. IMPRESSION: Interval evacuation of right-sided pleural effusion with a very small residual effusion and overall much improved aeration of the right lung. No postprocedural pneumothorax. Electronically Signed   By: MYRTIS Stammer M.D.   On: 07/09/2024 16:18   ECHOCARDIOGRAM COMPLETE Result Date: 07/08/2024    ECHOCARDIOGRAM REPORT   Patient Name:   Dina T Karle Date of Exam: 07/08/2024 Medical Rec #:  [id number]          Height:       70.0 in Accession #:    [id number]         Weight:       121.9 lb Date of Birth:  1950-06-24          BSA:          1.691 m Patient Age:    73 years           BP:           81/48 mmHg Patient Gender: M                  HR:           34 bpm. Exam Location:  Inpatient Procedure: 2D Echo, Cardiac Doppler, Color Doppler and Intracardiac            Opacification Agent (Both Spectral and Color Flow Doppler were            utilized during procedure). Indications:    Shock R57.9  History:        Patient has prior history of Echocardiogram examinations, most                 recent 05/13/2024. CAD; Risk Factors:Hypertension.  Sonographer:    Sydnee Wilson RDCS Referring Phys: [id number] PAULA SOUTHERLY IMPRESSIONS  1. Left ventricular ejection fraction, by estimation, is 25%. The left ventricle has severely decreased function. The left ventricle demonstrates global hypokinesis. Indeterminate diastolic filling due to E-A fusion.  2. Right ventricular systolic function is normal. The right ventricular size is normal.  3. The mitral valve is normal in structure. Mild mitral valve regurgitation.  4. AV is not visualized . No signficant gradient through the valve. Aortic valve regurgitation is not visualized.  5. The inferior vena cava is normal in size with greater than 50% respiratory  variability, suggesting right atrial pressure of 3 mmHg.  6. Poor acoustic windows limit study. FINDINGS  Left Ventricle: Left ventricular ejection fraction, by estimation, is 25%. The left ventricle has severely decreased function. The left  ventricle demonstrates global hypokinesis. Definity  contrast agent was given IV to delineate the left ventricular endocardial borders. Indeterminate diastolic filling due to E-A fusion. Right Ventricle: The right ventricular size is normal. Right vetricular wall thickness was not assessed. Right ventricular systolic function is normal. Left Atrium: Left atrial size was normal in size. Right Atrium: Right atrial size was normal in size. Pericardium: There is no evidence of pericardial effusion. Mitral Valve: The mitral valve is normal in structure. Mild mitral valve regurgitation. Tricuspid Valve: The tricuspid valve is normal in structure. Tricuspid valve regurgitation is trivial. Aortic Valve: AV is not visualized . No signficant gradient through the valve. The aortic valve was not well visualized. Aortic valve regurgitation is not visualized. Aortic valve mean gradient measures 2.0 mmHg. Aortic valve peak gradient measures 4.2 mmHg. Aortic valve area, by VTI measures 2.38 cm. Aorta: The aortic root is normal in size and structure. Venous: The inferior vena cava is normal in size with greater than 50% respiratory variability, suggesting right atrial pressure of 3 mmHg. IAS/Shunts: No atrial level shunt detected by color flow Doppler.  LEFT VENTRICLE PLAX 2D LVOT diam:     2.01 cm      Diastology LV SV:         47           LV e' medial:    14.80 cm/s LV SV Index:   28           LV E/e' medial:  7.8 LVOT Area:     3.17 cm     LV e' lateral:   16.60 cm/s                             LV E/e' lateral: 6.9  LV Volumes (MOD) LV vol d, MOD A2C: 90.4 ml LV vol d, MOD A4C: 148.0 ml LV vol s, MOD A2C: 56.0 ml LV vol s, MOD A4C: 96.1 ml LV SV MOD A2C:     34.4 ml LV SV MOD A4C:     148.0  ml LV SV MOD BP:      43.8 ml RIGHT VENTRICLE RV S prime:     12.60 cm/s TAPSE (M-mode): 1.6 cm LEFT ATRIUM             Index        RIGHT ATRIUM          Index LA diam:        3.47 cm 2.05 cm/m   RA Area:     8.31 cm LA Vol (A2C):   28.5 ml 16.85 ml/m  RA Volume:   17.60 ml 10.41 ml/m LA Vol (A4C):   31.2 ml 18.45 ml/m LA Biplane Vol: 32.5 ml 19.22 ml/m  AORTIC VALVE AV Area (Vmax):    2.53 cm AV Area (Vmean):   3.12 cm AV Area (VTI):     2.38 cm AV Vmax:           102.00 cm/s AV Vmean:          57.300 cm/s AV VTI:            0.197 m AV Peak Grad:      4.2 mmHg AV Mean Grad:      2.0 mmHg LVOT Vmax:         81.40 cm/s LVOT Vmean:        56.400 cm/s LVOT VTI:  0.148 m LVOT/AV VTI ratio: 0.75  AORTA Ao Root diam: 2.74 cm MITRAL VALVE MV Area (PHT): 5.02 cm     SHUNTS MV Decel Time: 151 msec     Systemic VTI:  0.15 m MV E velocity: 115.00 cm/s  Systemic Diam: 2.01 cm MV A velocity: 42.40 cm/s MV E/A ratio:  2.71 Vina Gull MD Electronically signed by Vina Gull MD Signature Date/Time: 07/08/2024/4:47:16 PM    Final    DG CHEST PORT 1 VIEW Result Date: 07/08/2024 EXAM: 1 VIEW(S) XRAY OF THE CHEST 07/08/2024 05:42:39 AM COMPARISON: Portable chest 07/06/2024 at 12:30 pm. CLINICAL HISTORY: 33498 Respiratory failure (HCC) 33498 Respiratory failure (HCC). FINDINGS: LINES, TUBES AND DEVICES: Left IJ (internal jugular) port catheter terminates at the superior cavoatrial junction. LUNGS AND PLEURA: Emphysematous and scarring changes of the lungs are noted. Increasing moderate right pleural effusion. Worsening penile Right perihilar airspace disease. Left lung remains clear of infiltrates. No pneumothorax. HEART AND MEDIASTINUM: There is aortic atherosclerosis with a stable mediastinum. The cardiac size is normal. BONES AND SOFT TISSUES: No acute osseous abnormality. IMPRESSION: 1. Increasing moderate right pleural effusion and right perihilar airspace disease. 2. Emphysematous and scarring changes of the  lungs. 3. Left lung remains clear of infiltrates. Electronically signed by: Francis Quam MD 07/08/2024 06:48 AM EST RP Workstation: [id number]   DG CHEST PORT 1 VIEW Result Date: 07/06/2024 CLINICAL DATA:  Status post thoracentesis EXAM: PORTABLE CHEST 1 VIEW COMPARISON:  April 05, 2024 FINDINGS: Stable cardiomediastinal silhouette. Interval placement of left internal jugular Port-A-Cath with distal tip in expected position of cavoatrial junction. Scarring is noted throughout both lungs. Minimal right pleural effusion is noted. No definite pneumothorax. Increased opacity seen laterally in right mid lung which may represent pneumonia. IMPRESSION: Minimal right pleural effusion is noted. No definite pneumothorax is noted. Increased opacity seen laterally in right midlung which may represent pneumonia. Electronically Signed   By: Lynwood Landy Raddle M.D.   On: 07/06/2024 13:04    Microbiology: Results for orders placed or performed during the hospital encounter of 07/06/24  MRSA Next Gen by PCR, Nasal     Status: None   Collection Time: 07/06/24  9:38 AM   Specimen: Nasal Mucosa; Nasal Swab  Result Value Ref Range Status   MRSA by PCR Next Gen NOT DETECTED NOT DETECTED Final    Comment: (NOTE) The GeneXpert MRSA Assay (FDA approved for NASAL specimens only), is one component of a comprehensive MRSA colonization surveillance program. It is not intended to diagnose MRSA infection nor to guide or monitor treatment for MRSA infections. Test performance is not FDA approved in patients less than 29 years old. Performed at [facility], [address] [place], [place] [address]   Body fluid culture w Gram Stain     Status: None   Collection Time: 07/06/24 11:15 AM   Specimen: Pleural Fluid  Result Value Ref Range Status   Specimen Description PLEURAL  Final   Special Requests NONE  Final   Gram Stain NO WBC SEEN NO ORGANISMS SEEN   Final   Culture   Final    NO GROWTH 3 DAYS Performed at  [facility], [address] [place], [place] [address]    Report Status 07/09/2024 FINAL  Final  Culture, blood (Routine X 2) w Reflex to ID Panel     Status: None   Collection Time: 07/07/24 10:17 AM   Specimen: BLOOD  Result Value Ref Range Status   Specimen  Description BLOOD RIGHT ANTECUBITAL  Final   Special Requests   Final    AEROBIC BOTTLE ONLY Blood Culture results may not be optimal due to an inadequate volume of blood received in culture bottles   Culture   Final    NO GROWTH 5 DAYS Performed at [facility], [address] [place], [place] [address]    Report Status 07/12/2024 FINAL  Final  Culture, blood (Routine X 2) w Reflex to ID Panel     Status: None   Collection Time: 07/07/24 11:16 AM   Specimen: BLOOD LEFT HAND  Result Value Ref Range Status   Specimen Description BLOOD LEFT HAND  Final   Special Requests AEROBIC BOTTLE ONLY Blood Culture adequate volume  Final   Culture   Final    NO GROWTH 5 DAYS Performed at [facility], [address], [place], [place] [address]    Report Status 07/12/2024 FINAL  Final   Labs: CBC: Recent Labs  Lab 07/10/24 0447 07/11/24 0409 07/12/24 0224 07/13/24 0334 07/14/24 0343  WBC 2.9* 2.2* 2.7* 3.0* 3.6*  HGB 9.3* 10.5* 8.4* 8.5* 8.9*  HCT 28.4* 32.2* 26.0* 26.7* 27.1*  MCV 87.7 87.3 88.7 90.2 89.1  PLT 172 161 175 180 208   Basic Metabolic Panel: Recent Labs  Lab 07/10/24 0447 07/11/24 0409 07/12/24 0224 07/14/24 0343  NA 134* 135 133* 134*  K 4.6 3.5 4.1 4.2  CL 99 96* 97* 100  CO2 28 32 29 27  GLUCOSE 117* 99 86 85  BUN 10 12 13 12   CREATININE 1.02 1.16 1.10 0.93  CALCIUM  8.0* 7.9* 7.6* 7.8*  MG 2.1 1.8 2.1 1.8  PHOS 2.2* 4.2  --   --    Liver Function Tests: No results for input(s): AST, ALT, ALKPHOS, BILITOT, PROT, ALBUMIN  in the last 168 hours. CBG: Recent Labs  Lab 07/10/24 0713 07/10/24 1127 07/10/24 1525 07/10/24 2320 07/11/24 0751  GLUCAP 118* 109* 105*  104* 124*    Discharge time spent: 35 minutes  Author: Yetta Blanch, MD  [facility] 07/14/2024   "

## 2024-07-18 ENCOUNTER — Telehealth: Payer: Self-pay

## 2024-07-18 ENCOUNTER — Telehealth: Payer: Self-pay | Admitting: *Deleted

## 2024-07-18 NOTE — Telephone Encounter (Signed)
 Copied from CRM [id number]. Topic: Clinical - Home Health Verbal Orders >> Jul 18, 2024 10:12 AM Delon HERO wrote: [name] Galley RN,  calling from [facility] is calling for skilled nursing orders. Frequency- 1 W 1, 2 W 1, 1 W 2  [name] is also calling to report that there is pantoprazole  20mg  in the home. Patient is taking this daily. However, medication is not on medication list .   Client is wearing oxygen  at 3L is this ok?   Should oxygen  be 3 liters.   Patient has  CB- [phone]

## 2024-07-18 NOTE — Patient Instructions (Signed)
 Visit Information  Thank you for taking time to visit with me today. Please don't hesitate to contact me if I can be of assistance to you before our next scheduled telephone appointment.  Our next appointment is by telephone on 07/28/24 at 10:30 am  Following is a copy of your care plan:   Goals Addressed             This Visit's Progress    [facility] Transitions of Care (TOC) Care Plan       Problems:  Recent Hospitalization for treatment of CHF Knowledge Deficit Related to supportive follow up for HF management, daily weights, BP monitoring Care of Pleural Draining and review done, patient is reviewed at Ambulatory Pleural Drainage Schedule Pleural drainage schedule to start Thursday, 1/22 . Drain and record 3x weekly (Tuesday/Thursday/Saturday), up to max of 1L until patient is only able to drain out . If <119mL for 3 consecutive drains every other day, then call Interventional Radiology [phone]) for evaluation and possible removal. Patient express doing well with schedule and knowledge base.  Goal:  Over the next 30 days, the patient will not experience hospital readmission  Interventions:  Transitions of Care: Doctor Visits  - discussed the importance of doctor visits  Heart Failure Interventions: Assessed need for readable accurate scales in home Advised patient to weigh each morning after emptying bladder Discussed importance of daily weight and advised patient to weigh and record daily Discussed the importance of keeping all appointments with provider Screening for signs and symptoms of depression related to chronic disease state  Assessed social determinant of health barriers   Patient Self Care Activities:  Attend all scheduled provider appointments Call pharmacy for medication refills 3-7 days in advance of running out of medications Call provider office for new concerns or questions  Notify RN Care Manager of TOC call rescheduling needs Participate in  Transition of Care Program/Attend TOC scheduled calls Perform all self care activities independently  Take medications as prescribed    Plan:  Follow up with provider re: Post hospital follow up for med review, labs, specialist The patient has been provided with contact information for the care management team and has been advised to call with any health related questions or concerns.  07/18/24  Discussed and offered 30 day TOC program.  Patient agrees to restart 30 day weekly calls.  The patient has been provided with contact information for the care management team and has been advised to call with any health -related questions or concerns.  The patient verbalized understanding with current plan of care.  The patient is directed to their insurance card regarding availability of benefits coverage.   Follow up call scheduled TOC for next week patient prefers after PCP appointment.         Patient verbalizes understanding of instructions and care plan provided today and agrees to view in MyChart. Active MyChart status and patient understanding of how to access instructions and care plan via MyChart confirmed with patient.     The patient has been provided with contact information for the care management team and has been advised to call with any health related questions or concerns.   Please call the care guide team at [phone] if you need to cancel or reschedule your appointment.   Please call the [place]  National Suicide Prevention Lifeline: 7031470234 or TTY: 670-342-4897 TTY (989) 493-0290) to talk to a trained counselor call the [facility]: [phone] if you are experiencing a Mental Health or Behavioral  Health Crisis or need someone to talk to.  Call 911.  Richerd Fish, RN, BSN, CCM [facility], [facility] RN Care Manager Direct Dial: [phone]

## 2024-07-18 NOTE — Transitions of Care (Post Inpatient/ED Visit) (Signed)
 "  07/18/2024  Name: [name] [name] MRN: [id number] DOB: 1951-04-27  Today's TOC FU Call Status: Today's TOC FU Call Status:: Successful TOC FU Call Completed  Patient's Name and Date of Birth confirmed. Name, DOB  Transition Care Management Follow-up Telephone Call Date of Discharge: 07/14/24 Discharge Facility: [facility]) Type of Discharge: Inpatient Admission How have you been since you were released from the hospital?: Better Any questions or concerns?: No  Items Reviewed: Did you receive and understand the discharge instructions provided?: Yes Medications obtained,verified, and reconciled?: Yes (Medications Reviewed) Any new allergies since your discharge?: No Dietary orders reviewed?: Yes Type of Diet Ordered:: Heart Healthy Do you have support at home?: Yes People in Home [RPT]: grandchild(ren), spouse Name of Support/Comfort Primary Source: [name]  Medications Reviewed Today: Medications Reviewed Today     Reviewed by Eilleen Richerd GRADE, RN (Registered Nurse) on 07/18/24 at 1232  Med List Status: <None>   Medication Order Taking? Sig Documenting Provider Last Dose Status Informant  albuterol  (PROVENTIL ) (2.5 MG/3ML) 0.083% nebulizer solution [id number] Yes INHALE ONE vial by NEBULIZER EVERY 6 HOURS AS NEEDED FOR WHEEZING OR SHORTNESS OF BREATH Dettinger, [name], MD  Active Spouse/Significant Other, Pharmacy Records  albuterol  (VENTOLIN  HFA) [phone] Base) MCG/ACT inhaler [id number] Yes Inhale 2 puffs into the lungs every 6 (six) hours as needed for wheezing or shortness of breath. [provider]  Active Spouse/Significant Other, Pharmacy Records  ALPRAZolam  (XANAX ) 0.5 MG tablet [id number] Yes Take 0.5 mg by mouth at bedtime as needed for sleep or anxiety. [provider]  Active Spouse/Significant Other, Pharmacy Records  amiodarone  (PACERONE ) 200 MG tablet [id number] Yes Take 1 tablet (200 mg total) by mouth daily. Tobie Yetta HERO, MD  Active    Bempedoic Acid -Ezetimibe  (NEXLIZET ) 180-10 MG TABS [id number] Yes TAKE ONE TABLET DAILY Dettinger, [name], MD  Active Pharmacy Records, Spouse/Significant Other  budesonide -glycopyrrolate -formoterol  (BREZTRI  AEROSPHERE) 160-9-4.8 MCG/ACT AERO inhaler [id number] Yes Inhale 2 puffs into the lungs in the morning and at bedtime. Pearlean Manus, MD  Active Spouse/Significant Other, Pharmacy Records  ELIQUIS  5 MG TABS tablet [id number] Yes Take 5 mg by mouth 2 (two) times daily. [provider]  Active Spouse/Significant Other, Pharmacy Records  fluticasone  (FLONASE ) 50 MCG/ACT nasal spray [id number] Yes Place 2 sprays into both nostrils daily. [facility] [place] Hummer, Nena, NP  Active Pharmacy Records, Spouse/Significant Other  HYDROcodone bit-homatropine (HYCODAN) 5-1.5 MG/5ML syrup [id number] Yes Take 5 mLs by mouth 4 (four) times daily as needed for cough. [provider]  Active Spouse/Significant Other, Pharmacy Records  hydrOXYzine  (VISTARIL ) 25 MG capsule [id number] Yes TAKE ONE CAPSULE EVERY 8 HOURS AS NEEDED Dettinger, [name], MD  Active Spouse/Significant Other, Pharmacy Records  levothyroxine  (SYNTHROID ) 50 MCG tablet [id number] Yes Take 1 tablet (50 mcg total) by mouth daily before breakfast. Pearlean Manus, MD  Active Spouse/Significant Other, Pharmacy Records  midodrine  (PROAMATINE ) 10 MG tablet [id number] Yes Take 1 tablet (10 mg total) by mouth 3 (three) times daily with meals. Call PCP if top blood pressure number remain high more than 140 to adjust the medicine. Tobie Yetta HERO, MD  Active   nitroGLYCERIN  (NITROSTAT ) 0.4 MG SL tablet [id number]  Place 1 tablet (0.4 mg total) under the tongue every 5 (five) minutes as needed for chest pain. Miriam Norris, NP  Active Spouse/Significant Other, Pharmacy Records  omeprazole  [facility]) 20 MG capsule [id number] Yes Take 1 capsule (20 mg total) by mouth daily. Pearlean Manus, MD  Active Spouse/Significant Other, Pharmacy Records  ondansetron  (ZOFRAN ) 8 MG tablet [id number] Yes Take 8 mg by mouth every 8 (eight) hours as needed for vomiting. [provider]  Active Spouse/Significant Other, Pharmacy Records  OXYGEN  [id number] Yes Inhale 2 L into the lungs daily. [provider]  Active Spouse/Significant Other, Pharmacy Records  prochlorperazine (COMPAZINE) 10 MG tablet [id number] Yes Take 10 mg by mouth every 8 (eight) hours as needed for nausea or vomiting. [provider]  Active Spouse/Significant Other, Pharmacy Records  traZODone  (DESYREL ) 100 MG tablet [id number] Yes Take 1 tablet (100 mg total) by mouth at bedtime. Dettinger, [name], MD  Active Spouse/Significant Other, Pharmacy Records            Home Care and Equipment/Supplies: Were Home Health Services Ordered?: Yes Name of Home Health Agency:: [facility])  Services: Home Health Services  Address: [address], [place] [address] Phone: [phone] Has Agency set up a time to come to your home?: Yes First Home Health Visit Date: 07/16/24 (Due to inclement weather will schedule ongoing visits appropriately per patient)  Functional Questionnaire: Do you need assistance with bathing/showering or dressing?: Yes Do you need assistance with meal preparation?: Yes Do you need assistance with eating?: No Do you have difficulty maintaining continence: No Do you need assistance with getting out of bed/getting out of a chair/moving?: No Do you have difficulty managing or taking your medications?: Yes (Wife helps)  Follow up appointments reviewed: PCP Follow-up appointment confirmed?: Yes (with BMP lab to look at kidney/electrolyte numbers, with CBC lab to look   at blood counts, To discuss blood pressure meds) Date of PCP follow-up appointment?: 07/25/24 Follow-up Provider: Dr. Fonda [facility] Follow-up appointment confirmed?: Yes Date of Specialist follow-up appointment?:  07/20/24 Follow-Up Specialty Provider:: [facility] at [facility]  [phone] noted on AVS - office closed due to inclement weather today, reminded patient Do you need transportation to your follow-up appointment?: No Do you understand care options if your condition(s) worsen?: Yes-patient verbalized understanding  SDOH Interventions Today    Flowsheet Row Most Recent Value  SDOH Interventions   Food Insecurity Interventions Intervention Not Indicated  Housing Interventions Intervention Not Indicated  Transportation Interventions Intervention Not Indicated  Utilities Interventions Intervention Not Indicated     Richerd Fish, RN, BSN, CCM [place]  [facility], [facility] RN Care Manager Direct Dial: [phone]        "

## 2024-07-20 ENCOUNTER — Ambulatory Visit

## 2024-07-22 ENCOUNTER — Telehealth: Payer: Self-pay | Admitting: *Deleted

## 2024-07-22 NOTE — Telephone Encounter (Signed)
 Yes oxygen  can be 3 L, I am fine with that.  Also I am fine with him taking the pantoprazole  I do not know why it would be discontinued.

## 2024-07-22 NOTE — Telephone Encounter (Signed)
 Okay we will just have them continue to monitor and we will discuss it further when we see him at his next visit

## 2024-07-22 NOTE — Telephone Encounter (Signed)
 Left detailed message for [place] with [facility] giving verbal approval on orders and also with the info Dr Dettinger provided regarding patients Rx and Oxygen . Advised for Tonya to call back if she has additional questions.

## 2024-07-22 NOTE — Telephone Encounter (Signed)
 Please advise on O2 setting, Looks like Pantoprazole  was [facility] on 07/14/24 at hospital discharge

## 2024-07-22 NOTE — Telephone Encounter (Signed)
 Todd, occupational therapist, [name] is calling to report HR- 110 at rest and 121 with activity. He is required to report if resting heart rate is 110 or greater.  Patient is scheduled to see PCP on Monday, February 2nd at 12:55 pm..........FYI

## 2024-07-25 ENCOUNTER — Encounter: Payer: Self-pay | Admitting: Family Medicine

## 2024-07-25 ENCOUNTER — Ambulatory Visit: Admitting: Family Medicine

## 2024-07-25 DIAGNOSIS — C342 Malignant neoplasm of middle lobe, bronchus or lung: Secondary | ICD-10-CM | POA: Diagnosis not present

## 2024-07-25 DIAGNOSIS — I10 Essential (primary) hypertension: Secondary | ICD-10-CM | POA: Diagnosis not present

## 2024-07-25 DIAGNOSIS — E039 Hypothyroidism, unspecified: Secondary | ICD-10-CM | POA: Diagnosis not present

## 2024-07-25 DIAGNOSIS — F419 Anxiety disorder, unspecified: Secondary | ICD-10-CM

## 2024-07-25 DIAGNOSIS — E782 Mixed hyperlipidemia: Secondary | ICD-10-CM

## 2024-07-25 MED ORDER — ALPRAZOLAM 0.5 MG PO TABS: 0.5000 mg | ORAL_TABLET | Freq: Every evening | ORAL | 3 refills | Status: AC | PRN

## 2024-07-25 MED ORDER — HYDROCODONE BIT-HOMATROP MBR 5-1.5 MG/5ML PO SOLN: 5.0000 mL | Freq: Four times a day (QID) | ORAL | 0 refills | Status: AC | PRN

## 2024-07-27 ENCOUNTER — Encounter (HOSPITAL_BASED_OUTPATIENT_CLINIC_OR_DEPARTMENT_OTHER): Payer: Self-pay

## 2024-07-27 ENCOUNTER — Ambulatory Visit (HOSPITAL_BASED_OUTPATIENT_CLINIC_OR_DEPARTMENT_OTHER)

## 2024-07-27 VITALS — BP 97/60 | HR 104 | Ht 70.0 in | Wt 120.0 lb

## 2024-07-27 DIAGNOSIS — J910 Malignant pleural effusion: Secondary | ICD-10-CM | POA: Diagnosis not present

## 2024-07-27 DIAGNOSIS — C3490 Malignant neoplasm of unspecified part of unspecified bronchus or lung: Secondary | ICD-10-CM

## 2024-07-27 DIAGNOSIS — C342 Malignant neoplasm of middle lobe, bronchus or lung: Secondary | ICD-10-CM

## 2024-07-27 DIAGNOSIS — R5381 Other malaise: Secondary | ICD-10-CM

## 2024-07-27 DIAGNOSIS — J9611 Chronic respiratory failure with hypoxia: Secondary | ICD-10-CM

## 2024-07-27 DIAGNOSIS — J449 Chronic obstructive pulmonary disease, unspecified: Secondary | ICD-10-CM | POA: Diagnosis not present

## 2024-07-27 MED ORDER — ARFORMOTEROL TARTRATE 15 MCG/2ML IN NEBU: 15.0000 ug | INHALATION_SOLUTION | Freq: Two times a day (BID) | RESPIRATORY_TRACT | 6 refills | Status: AC

## 2024-07-27 NOTE — Patient Instructions (Addendum)
 Start Brovana  nebs twice daily.  May use Albuterol  every 4-6 hours as needed for shortness of breath.  Continue Pleurx drain/care 3x/week as scheduled (Tuesday, Thursday, Saturday); see attached instructions and red flag considerations.  Follow up with Oncology as scheduled tomorrow.  Follow up in our office in 6 weeks with either myself or Dr. [name].  Follow up sooner if new or worsening symptoms up to and including issues with the catheter.  Continue at home therapy to improve mobilization.  Contact us  sooner if drainage less than 200 ml for 3 days in a row, fever, changes in fluid drainage/color, or increased pain.

## 2024-07-27 NOTE — Progress Notes (Signed)
 "  @Patient  ID: [name] [name], male    DOB: 29-Apr-1951, 74 y.o.   MRN: [id number]  Chief Complaint  Patient presents with   Follow-up    Hospital     Referring provider: Dettinger, [name], MD  HPI: Discussed the use of AI scribe software for clinical note transcription with the patient, who gave verbal consent to proceed.  History of Present Illness [name] [name] is a 74 year old male with COPD and NSCLC who presents for follow-up after recent hospitalization for pleural effusion. He is accompanied by his wife, Ronal.  He is currently undergoing treatment with a combination of chemotherapy and radiation treatments managed through [facility].  He was recently hospitalized for shortness of breath and found to have a pleural effusion, during the hospitalization thoracentesis was completed and removed two liters of fluid initially, followed by nearly two more liters two days later. He now has a Pleurx catheter in place for regular drainage, which he performs every other day, removing varying amounts of fluid, such as 800 mL yesterday and 590 mL on Saturday. He is on a Tuesday, Thursday, Saturday regimen for fluid removal.  He reports that his breathing is a whole lot better now that he has the catheter in place, although he experiences some pain at the end of the drainage process.  He has a history of lung cancer and is currently undergoing treatment, with three more chemotherapy sessions and fifteen radiation treatments remaining. He reports that the chemotherapy affects his heart, causing it to 'mess up' and leading to multiple hospitalizations. He has not experienced GI sickness from chemotherapy but is concerned about its impact on his heart.  He uses a nebulizer with Albuterol  and albuterol  inhaler for breathing issues, typically using them a couple of times a day, especially in the morning. He previously used Breztri  but stopped due to feeling 'weird' and lightheaded. He also  tried Trelegy but was transitioned to Breztri .  He reports dyspnea but feels that this has improved since the catheter placement.  He has a variable appetite, eating better at supper than other meals, and avoids protein shakes due to constipation concerns. He experiences a productive cough, bringing up 'thick white' mucus.  His granddaughter assists with his catheter care, and he is receiving physical therapy at home, which he feels is beneficial. He has not seen his oncologist since hospital discharge but has an appointment scheduled for tomorrow.    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  This was reviewed with him today.  Hospitalization 1/14-1/22/2026: Patient is a 73 y.o.  male with history of NSCLC-undergoing chemo/radiation at [facility]-presented to [facility] ER with shortness of breath-he was found to have a large right-sided pleural effusion-thoracocentesis was attempted but unsuccessful-he then got hypotensive following IV Lasix  infusion-he was started on norepinephrine -and transferred to [facility] ICU.   Significant events: 1/12 admit to [facility] R 1/14 transfer to [facility] 1/17 second thoracentesis, drained 1.4 L 1/18 started midodrine , levophed  is off 1/20>> transferred to [facility]   Significant studies: 1/16>> CXR: Increasing moderate right pleural effusion. 1/16>> echo: EF 25%   Significant microbiology data: 1/14>> body fluid culture: No growth 1/15>> blood culture: No growth   Procedures: 1/17>> thoracocentesis by PCCM   Consults: PCCM IR Cardiology   TEST/EVENTS :  Right-sided pleural effusion Likely malignant S/p thoracocentesis on 1/14 and 1/17 PleurX was placed by IR on 1/20. Currently drainage schedule Tuesday Thursday Saturday.  Pulmonary will follow outpatient.  Allergies[1]  Immunization History  Administered Date(s) Administered   Pneumococcal Conjugate-13 01/07/2016   Pneumococcal  Polysaccharide-23 06/29/2017    Past Medical History:  Diagnosis Date   Arthritis    Carotid artery stenosis without cerebral infarction    vascular --- dr eliza;   09-24-2007  s/p right CEA   Chronic cholecystitis    w/  large calcified stone in gallbladder   Chronic constipation    COPD (chronic obstructive pulmonary disease) (HCC)    followed by pcp----  (05-28-2021  pt stated last used rescue 05-27-2021 for wheezing resolved after use, stated exacerbation approx  greater than a year ago)   Coronary artery disease 2002   cardiologist-- dr lavona ;  (05-28-2021  pt denies need to use nitro for CP) per last cath 08-04-2017  LM luminal irregularities, LAD 30% ,D1 ostial 35% ,CFx   AV groove ostial 25% , OM1 long prox 30%,  proxRCA occluded and dominant,   excellent left to right collateral filling.   Depression    Patient denies   DOE (dyspnea on exertion)    due to smoking/ copd   Dyslipidemia    Essential hypertension    Full dentures    GERD (gastroesophageal reflux disease)    History of adenomatous polyp of colon    History of COVID-19    per pt winter 2022, mild to moderate symptoms that resolved   History of diverticulitis of colon    History of kidney stones    Hyperlipidemia    Hypothyroidism    followed by pcp   Lung nodule    s/p bronchoscopy w/ bx in 2005,  benign   Osteoporosis    Smokers' cough (HCC)    05-28-2021  pt stated occasionally productive w/ white phlegm   Wears glasses     Tobacco History: Tobacco Use History[2] Counseling given: Not Answered Tobacco comments: Quit smoking mid September 2025   Outpatient Medications Prior to Visit  Medication Sig Dispense Refill   albuterol  (PROVENTIL ) (2.5 MG/3ML) 0.083% nebulizer solution INHALE ONE vial by NEBULIZER EVERY 6 HOURS AS NEEDED FOR WHEEZING OR SHORTNESS OF BREATH 150 mL 1   albuterol  (VENTOLIN  HFA) 108 (90 Base) MCG/ACT inhaler Inhale 2 puffs into the lungs every 6 (six) hours as needed for  wheezing or shortness of breath.     ALPRAZolam  (XANAX ) 0.5 MG tablet Take 1 tablet (0.5 mg total) by mouth at bedtime as needed for sleep or anxiety. 30 tablet 3   amiodarone  (PACERONE ) 200 MG tablet Take 1 tablet (200 mg total) by mouth daily. 30 tablet 0   Bempedoic Acid -Ezetimibe  (NEXLIZET ) 180-10 MG TABS TAKE ONE TABLET DAILY 90 tablet 1   ELIQUIS  5 MG TABS tablet Take 5 mg by mouth 2 (two) times daily.     fluticasone  (FLONASE ) 50 MCG/ACT nasal spray Place 2 sprays into both nostrils daily. 16 g 6   HYDROcodone  bit-homatropine (HYCODAN) 5-1.5 MG/5ML syrup Take 5 mLs by mouth 4 (four) times daily as needed for cough. 120 mL 0   levothyroxine  (SYNTHROID ) 50 MCG tablet Take 1 tablet (50 mcg total) by mouth daily before breakfast. 90 tablet 3   midodrine  (PROAMATINE ) 10 MG tablet Take 1 tablet (10 mg total) by mouth 3 (three) times daily with meals. Call PCP if top blood pressure number remain high more than 140 to adjust the medicine. 90 tablet 0   nitroGLYCERIN  (NITROSTAT ) 0.4 MG SL tablet Place 1 tablet (0.4 mg total) under  the tongue every 5 (five) minutes as needed for chest pain. 25 tablet 3   omeprazole  (PRILOSEC) 20 MG capsule Take 1 capsule (20 mg total) by mouth daily. 30 capsule 5   ondansetron  (ZOFRAN ) 8 MG tablet Take 8 mg by mouth every 8 (eight) hours as needed for vomiting.     OXYGEN  Inhale 2 L into the lungs daily.     prochlorperazine (COMPAZINE) 10 MG tablet Take 10 mg by mouth every 8 (eight) hours as needed for nausea or vomiting.     traZODone  (DESYREL ) 100 MG tablet Take 1 tablet (100 mg total) by mouth at bedtime. 90 tablet 1   budesonide -glycopyrrolate -formoterol  (BREZTRI  AEROSPHERE) 160-9-4.8 MCG/ACT AERO inhaler Inhale 2 puffs into the lungs in the morning and at bedtime. 10.7 g 11   No facility-administered medications prior to visit.     Review of Systems: as per hpi  Constitutional:   No  weight loss, night sweats,  Fevers, chills, fatigue, or   lassitude.  HEENT:   No headaches,  Difficulty swallowing,  Tooth/dental problems, or  Sore throat,                No sneezing, itching, ear ache, nasal congestion, post nasal drip,   CV:  No chest pain,  Orthopnea, PND, swelling in lower extremities, anasarca, dizziness, palpitations, syncope.   GI  No heartburn, indigestion, abdominal pain, nausea, vomiting, diarrhea, change in bowel habits, loss of appetite, bloody stools.   Resp: No shortness of breath with exertion or at rest.  No excess mucus, no productive cough,  No non-productive cough,  No coughing up of blood.  No change in color of mucus.  No wheezing.  No chest wall deformity  Skin: no rash or lesions.  GU: no dysuria, change in color of urine, no urgency or frequency.  No flank pain, no hematuria   MS:  No joint pain or swelling.  No decreased range of motion.  No back pain.    Physical Exam  BP 97/60   Pulse (!) 104   Ht 5' 10 (1.778 m)   Wt 120 lb (54.4 kg)   SpO2 90% Comment: 2 liters  BMI 17.22 kg/m   GEN: A/Ox3; pleasant , pale and thin.  Speaks in full sentences but some pursed lip breathing noted    HEENT:  [place]/AT,  EACs-clear, TMs-wnl, NOSE-clear, THROAT-clear, no lesions, no postnasal drip or exudate noted.   NECK:  Supple w/ fair ROM; no JVD; normal carotid impulses w/o bruits; no thyromegaly or nodules palpated; no lymphadenopathy.    RESP  Clear but diminished throughout P & A; w/o, wheezes/ rales/ or rhonchi. no accessory muscle use, no dullness to percussion   Chest wall with Pleurx catheter intact on R chest and clean,dry dressing intact.  No surrounding erythema or drainage noted on dressing  CARD:  RRR, no m/r/g, no peripheral edema, pulses intact, no cyanosis or clubbing.  GI:   Soft & nt; nml bowel sounds; no organomegaly or masses detected.   Musco: Warm bil, no deformities or joint swelling noted.   Neuro: alert, no focal deficits noted.    Skin: Warm, no lesions or rashes    Lab  Results:  CBC    Component Value Date/Time   WBC 3.6 (L) 07/14/2024 0343   RBC 3.04 (L) 07/14/2024 0343   HGB 8.9 (L) 07/14/2024 0343   HGB 15.4 10/02/2023 0839   HCT 27.1 (L) 07/14/2024 0343   HCT 46.8 10/02/2023 0839  PLT 208 07/14/2024 0343   PLT 240 10/02/2023 0839   MCV 89.1 07/14/2024 0343   MCV 91 10/02/2023 0839   MCH 29.3 07/14/2024 0343   MCHC 32.8 07/14/2024 0343   RDW 16.8 (H) 07/14/2024 0343   RDW 12.4 10/02/2023 0839   LYMPHSABS 2.8 12/31/2023 1029   LYMPHSABS 3.1 10/02/2023 0839   MONOABS 0.9 12/31/2023 1029   EOSABS 0.2 12/31/2023 1029   EOSABS 0.1 10/02/2023 0839   BASOSABS 0.0 12/31/2023 1029   BASOSABS 0.0 10/02/2023 0839    BMET    Component Value Date/Time   NA 134 (L) 07/14/2024 0343   NA 137 10/08/2023 1425   K 4.2 07/14/2024 0343   CL 100 07/14/2024 0343   CO2 27 07/14/2024 0343   GLUCOSE 85 07/14/2024 0343   BUN 12 07/14/2024 0343   BUN 19 10/08/2023 1425   CREATININE 0.93 07/14/2024 0343   CALCIUM  7.8 (L) 07/14/2024 0343   GFRNONAA >60 07/14/2024 0343   GFRAA 72 03/09/2020 1033    BNP    Component Value Date/Time   BNP 127.0 (H) 01/29/2024 1726    ProBNP No results found for: PROBNP  Imaging: IR TUNN PLEURX CATH W/IMG Result Date: 07/12/2024 INDICATION: Lung cancer, malignant recurrent right pleural effusion, shortness of breath EXAM: ULTRASOUND FLUOROSCOPIC RIGHT CHEST TUNNELED PLEURAL CATHETER (PLEURX) Date:  07/12/2024 07/12/2024 12:55 pm Radiologist:  CHRISTELLA. Frederic Specking, MD Guidance:  ULTRASOUND AND FLUOROSCOPIC FLUOROSCOPY: 0 minutes 6 seconds (1 mGy). MEDICATIONS: 2 G ANCEF  ANESTHESIA/SEDATION: Moderate (conscious) sedation was employed during this procedure. A total of Versed  1.0 mg and Fentanyl  25 mcg was administered intravenously. Moderate Sedation Time: 12 minutes. The patient's level of consciousness and vital signs were monitored continuously by radiology nursing throughout the procedure under my direct supervision.  CONTRAST:  NONE. COMPLICATIONS: None immediate. PROCEDURE: Informed consent was obtained from the patient following explanation of the procedure, risks, benefits and alternatives. The patient understands, agrees and consents for the procedure. All questions were addressed. A time out was performed. Maximal barrier sterile technique utilized including caps, mask, sterile gowns, sterile gloves, large sterile drape, hand hygiene, and ChloraPrep. Previous imaging reviewed. The right pleural effusion was localized in the mid axillary line through a lower intercostal space. Overlying skin marked for access. Under sterile conditions and local anesthesia, ultrasound percutaneous needle access performed of the effusion on the right side. Needle position confirmed with ultrasound. Images obtained for documentation. There was return of clear pleural fluid. Guidewire inserted followed by tract dilatation insert a peel-away sheath. The PleurX catheter was tunneled subcutaneously under sterile conditions and local anesthesia to the entry site and advanced through the valve peel-away sheath into the right chest cavity. Position confirmed with ultrasound and fluoroscopy. 700 cc thoracentesis performed. Catheter secured with a silk suture. Entry site closed with Dermabond. Sterile dressing applied. No immediate complication. Patient tolerated the procedure well. IMPRESSION: Successful ultrasound and fluoroscopic guided insertion of a tunneled pleural catheter on the right. (Right chest PleurX catheter) 700 cc thoracentesis Electronically Signed   By: CHRISTELLA.  Shick M.D.   On: 07/12/2024 13:10   DG CHEST PORT 1 VIEW Result Date: 07/09/2024 CLINICAL DATA:  Status post thoracentesis. EXAM: PORTABLE CHEST 1 VIEW COMPARISON:  07/08/2024 FINDINGS: Interval evacuation of the right-sided pleural effusion with a very small residual effusion and overall much improved aeration of the right lung with resolution atelectasis. Chronic underlying  pulmonary scarring changes. No postprocedural pneumothorax. The cardiac silhouette, mediastinal and hilar contours are stable. The left  IJ Port-A-Cath is stable. IMPRESSION: Interval evacuation of right-sided pleural effusion with a very small residual effusion and overall much improved aeration of the right lung. No postprocedural pneumothorax. Electronically Signed   By: MYRTIS Stammer M.D.   On: 07/09/2024 16:18   ECHOCARDIOGRAM COMPLETE Result Date: 07/08/2024    ECHOCARDIOGRAM REPORT   Patient Name:   Bryon T Feild Date of Exam: 07/08/2024 Medical Rec #:  [id number]          Height:       70.0 in Accession #:    [id number]         Weight:       121.9 lb Date of Birth:  01/30/1951          BSA:          1.691 m Patient Age:    73 years           BP:           81/48 mmHg Patient Gender: M                  HR:           34 bpm. Exam Location:  Inpatient Procedure: 2D Echo, Cardiac Doppler, Color Doppler and Intracardiac            Opacification Agent (Both Spectral and Color Flow Doppler were            utilized during procedure). Indications:    Shock R57.9  History:        Patient has prior history of Echocardiogram examinations, most                 recent 05/13/2024. CAD; Risk Factors:Hypertension.  Sonographer:    Sydnee Wilson RDCS Referring Phys: [id number] PAULA SOUTHERLY IMPRESSIONS  1. Left ventricular ejection fraction, by estimation, is 25%. The left ventricle has severely decreased function. The left ventricle demonstrates global hypokinesis. Indeterminate diastolic filling due to E-A fusion.  2. Right ventricular systolic function is normal. The right ventricular size is normal.  3. The mitral valve is normal in structure. Mild mitral valve regurgitation.  4. AV is not visualized . No signficant gradient through the valve. Aortic valve regurgitation is not visualized.  5. The inferior vena cava is normal in size with greater than 50% respiratory variability, suggesting right atrial pressure of 3  mmHg.  6. Poor acoustic windows limit study. FINDINGS  Left Ventricle: Left ventricular ejection fraction, by estimation, is 25%. The left ventricle has severely decreased function. The left ventricle demonstrates global hypokinesis. Definity  contrast agent was given IV to delineate the left ventricular endocardial borders. Indeterminate diastolic filling due to E-A fusion. Right Ventricle: The right ventricular size is normal. Right vetricular wall thickness was not assessed. Right ventricular systolic function is normal. Left Atrium: Left atrial size was normal in size. Right Atrium: Right atrial size was normal in size. Pericardium: There is no evidence of pericardial effusion. Mitral Valve: The mitral valve is normal in structure. Mild mitral valve regurgitation. Tricuspid Valve: The tricuspid valve is normal in structure. Tricuspid valve regurgitation is trivial. Aortic Valve: AV is not visualized . No signficant gradient through the valve. The aortic valve was not well visualized. Aortic valve regurgitation is not visualized. Aortic valve mean gradient measures 2.0 mmHg. Aortic valve peak gradient measures 4.2 mmHg. Aortic valve area, by VTI measures 2.38 cm. Aorta: The aortic root is normal in size and structure. Venous: The inferior vena cava is normal in  size with greater than 50% respiratory variability, suggesting right atrial pressure of 3 mmHg. IAS/Shunts: No atrial level shunt detected by color flow Doppler.  LEFT VENTRICLE PLAX 2D LVOT diam:     2.01 cm      Diastology LV SV:         47           LV e' medial:    14.80 cm/s LV SV Index:   28           LV E/e' medial:  7.8 LVOT Area:     3.17 cm     LV e' lateral:   16.60 cm/s                             LV E/e' lateral: 6.9  LV Volumes (MOD) LV vol d, MOD A2C: 90.4 ml LV vol d, MOD A4C: 148.0 ml LV vol s, MOD A2C: 56.0 ml LV vol s, MOD A4C: 96.1 ml LV SV MOD A2C:     34.4 ml LV SV MOD A4C:     148.0 ml LV SV MOD BP:      43.8 ml RIGHT VENTRICLE RV S  prime:     12.60 cm/s TAPSE (M-mode): 1.6 cm LEFT ATRIUM             Index        RIGHT ATRIUM          Index LA diam:        3.47 cm 2.05 cm/m   RA Area:     8.31 cm LA Vol (A2C):   28.5 ml 16.85 ml/m  RA Volume:   17.60 ml 10.41 ml/m LA Vol (A4C):   31.2 ml 18.45 ml/m LA Biplane Vol: 32.5 ml 19.22 ml/m  AORTIC VALVE AV Area (Vmax):    2.53 cm AV Area (Vmean):   3.12 cm AV Area (VTI):     2.38 cm AV Vmax:           102.00 cm/s AV Vmean:          57.300 cm/s AV VTI:            0.197 m AV Peak Grad:      4.2 mmHg AV Mean Grad:      2.0 mmHg LVOT Vmax:         81.40 cm/s LVOT Vmean:        56.400 cm/s LVOT VTI:          0.148 m LVOT/AV VTI ratio: 0.75  AORTA Ao Root diam: 2.74 cm MITRAL VALVE MV Area (PHT): 5.02 cm     SHUNTS MV Decel Time: 151 msec     Systemic VTI:  0.15 m MV E velocity: 115.00 cm/s  Systemic Diam: 2.01 cm MV A velocity: 42.40 cm/s MV E/A ratio:  2.71 Vina Gull MD Electronically signed by Vina Gull MD Signature Date/Time: 07/08/2024/4:47:16 PM    Final    DG CHEST PORT 1 VIEW Result Date: 07/08/2024 EXAM: 1 VIEW(S) XRAY OF THE CHEST 07/08/2024 05:42:39 AM COMPARISON: Portable chest 07/06/2024 at 12:30 pm. CLINICAL HISTORY: 33498 Respiratory failure (HCC) 33498 Respiratory failure (HCC). FINDINGS: LINES, TUBES AND DEVICES: Left IJ (internal jugular) port catheter terminates at the superior cavoatrial junction. LUNGS AND PLEURA: Emphysematous and scarring changes of the lungs are noted. Increasing moderate right pleural effusion. Worsening penile Right perihilar airspace disease. Left lung remains clear of infiltrates. No pneumothorax. HEART  AND MEDIASTINUM: There is aortic atherosclerosis with a stable mediastinum. The cardiac size is normal. BONES AND SOFT TISSUES: No acute osseous abnormality. IMPRESSION: 1. Increasing moderate right pleural effusion and right perihilar airspace disease. 2. Emphysematous and scarring changes of the lungs. 3. Left lung remains clear of infiltrates.  Electronically signed by: Francis Quam MD 07/08/2024 06:48 AM EST RP Workstation: [id number]   DG CHEST PORT 1 VIEW Result Date: 07/06/2024 CLINICAL DATA:  Status post thoracentesis EXAM: PORTABLE CHEST 1 VIEW COMPARISON:  April 05, 2024 FINDINGS: Stable cardiomediastinal silhouette. Interval placement of left internal jugular Port-A-Cath with distal tip in expected position of cavoatrial junction. Scarring is noted throughout both lungs. Minimal right pleural effusion is noted. No definite pneumothorax. Increased opacity seen laterally in right mid lung which may represent pneumonia. IMPRESSION: Minimal right pleural effusion is noted. No definite pneumothorax is noted. Increased opacity seen laterally in right midlung which may represent pneumonia. Electronically Signed   By: Lynwood Landy Raddle M.D.   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 Fluid analysis confirmed malignancy. Symptoms improved post-catheter placement. Oncology follow-up scheduled. - Continue PleurX catheter drainage every other day.  Instructions for catheter care and red flag symptoms attached to AVS - Follow up with oncology to discuss further treatment options and goals of care.  Chronic obstructive pulmonary disease (COPD) COPD management complicated by Breztri  intolerance. Current regimen includes albuterol . Discussed nebulizer-based regimen for better tolerance. Proposed Brovana  as long-acting alternative. - Prescribed Brovana   (arformoterol ) for nebulizer use twice daily. - Continue albuterol  as needed for acute symptoms. - Sent prescription to [facility] - Monitor response to Brovana  and adjust treatment as necessary.    Return in about 6 weeks (around 09/07/2024).  Candis Dandy, PA-C 07/27/2024      [1]  Allergies Allergen Reactions   Flomax  [Tamsulosin  Hcl] Other (See Comments)    Made patient hypotensive, clammy, sweaty, and feel like he was going to pass out (EMS had to be called)   Crestor [Rosuvastatin Calcium ] Other (See Comments)    Lethargy and muscle aches   Lipitor [Atorvastatin Calcium ] Other (See Comments)    Body cramps, lethargy, and muscle aches  [2]  Social History Tobacco Use  Smoking Status Former   Current packs/day: 0.00   Average packs/day: 1 pack/day for 58.6 years (58.9 ttl pk-yrs)   Types: Cigarettes   Start date: 2025   Quit date: 01/29/2024   Years since quitting: 0.4  Smokeless Tobacco Never  Tobacco Comments   Quit smoking mid September 2025   "

## 2024-07-28 ENCOUNTER — Telehealth: Payer: Self-pay

## 2024-07-28 ENCOUNTER — Other Ambulatory Visit: Payer: Self-pay

## 2024-07-28 NOTE — Patient Instructions (Signed)
 Visit Information  Thank you for taking time to visit with me today. Please don't hesitate to contact me if I can be of assistance to you before our next scheduled telephone appointment.  Our next appointment is by telephone on 08/04/24 at 13:00  Following is a copy of your care plan:   Goals Addressed             This Visit's Progress    [facility] Transitions of Care (TOC) Care Plan       Problems:  Recent Hospitalization for treatment of CHF Knowledge Deficit Related to supportive follow up for HF management, daily weights, BP monitoring Care of Pleural Draining and review done, patient is reviewed at Ambulatory Pleural Drainage Schedule Pleural drainage schedule to start Thursday, 1/22 . Drain and record 3x weekly (Tuesday/Thursday/Saturday), up to max of 1L until patient is only able to drain out . If <157mL for 3 consecutive drains every other day, then call Interventional Radiology [phone]) for evaluation and possible removal. Patient express doing well with schedule and knowledge base. 07/28/24 Patient states he has been unable to check BP due to batteries are dead and he is on his way to oncology appointment. BP reviewed at Pulmonology appointment yesterday 97/60 and verbalized importance of checking regularly.  Goal:  Over the next 30 days, the patient will not experience hospital readmission  Interventions:  Transitions of Care: Doctor Visits  - discussed the importance of doctor visits  Heart Failure Interventions: Assessed need for readable accurate scales in home Advised patient to weigh each morning after emptying bladder Discussed importance of daily weight and advised patient to weigh and record daily Discussed the importance of keeping all appointments with provider Screening for signs and symptoms of depression related to chronic disease state  Assessed social determinant of health barriers   Patient Self Care Activities:  Attend all scheduled provider  appointments Call pharmacy for medication refills 3-7 days in advance of running out of medications Call provider office for new concerns or questions  Notify RN Care Manager of TOC call rescheduling needs Participate in Transition of Care Program/Attend TOC scheduled calls Perform all self care activities independently  Take medications as prescribed    Plan:  Follow up with provider re: Post hospital follow up for med review, labs, specialist The patient has been provided with contact information for the care management team and has been advised to call with any health related questions or concerns.  07/18/24  Discussed and offered 30 day TOC program.  Patient agrees to restart 30 day weekly calls.  The patient has been provided with contact information for the care management team and has been advised to call with any health -related questions or concerns.  The patient verbalized understanding with current plan of care.  The patient is directed to their insurance card regarding availability of benefits coverage.   Follow up call scheduled TOC for next week patient prefers after PCP appointment.         Patient verbalizes understanding of instructions and care plan provided today and agrees to view in MyChart. Active MyChart status and patient understanding of how to access instructions and care plan via MyChart confirmed with patient.     The patient has been provided with contact information for the care management team and has been advised to call with any health related questions or concerns.   Please call the care guide team at [phone] if you need to cancel or reschedule your appointment.  Please call the [place]  National Suicide Prevention Lifeline: 267-687-5313 or TTY: (854)818-7905 TTY (602)168-0300) to talk to a trained counselor if you are experiencing a Mental Health or Behavioral Health Crisis or need someone to talk to.  Richerd Fish, RN, BSN, CCM [facility], [facility] RN Care Manager Direct Dial: [phone]

## 2024-07-28 NOTE — Transitions of Care (Post Inpatient/ED Visit) (Unsigned)
 " Transition of Care week 2  Visit Note  07/28/2024  Name: [name] [name] MRN: [id number]          DOB: [birth year]  Situation: Patient enrolled in [facility] 30-day program. Visit completed with patient by telephone.   Background:   Initial Transition Care Management Follow-up Telephone Call Discharge Date and Diagnosis: 07/14/24, Pleural Effusion   Past Medical History:  Diagnosis Date   Arthritis    Carotid artery stenosis without cerebral infarction    vascular --- dr eliza;   09-24-2007  s/p right CEA   Chronic cholecystitis    w/  large calcified stone in gallbladder   Chronic constipation    COPD (chronic obstructive pulmonary disease) (HCC)    followed by pcp----  (05-28-2021  pt stated last used rescue 05-27-2021 for wheezing resolved after use, stated exacerbation approx  greater than a year ago)   Coronary artery disease 2002   cardiologist-- dr lavona ;  (05-28-2021  pt denies need to use nitro for CP) per last cath 08-04-2017  LM luminal irregularities, LAD 30% ,D1 ostial 35% ,CFx   AV groove ostial 25% , OM1 long prox 30%,  proxRCA occluded and dominant,   excellent left to right collateral filling.   Depression    Patient denies   DOE (dyspnea on exertion)    due to smoking/ copd   Dyslipidemia    Essential hypertension    Full dentures    GERD (gastroesophageal reflux disease)    History of adenomatous polyp of colon    History of COVID-19    per pt winter 2022, mild to moderate symptoms that resolved   History of diverticulitis of colon    History of kidney stones    Hyperlipidemia    Hypothyroidism    followed by pcp   Lung nodule    s/p bronchoscopy w/ bx in 2005,  benign   Osteoporosis    Smokers' cough (HCC)    05-28-2021  pt stated occasionally productive w/ white phlegm   Wears glasses     Assessment: Patient Reported Symptoms: Cognitive Cognitive Status: Able to follow simple commands, Alert and oriented to person, place, and time, Normal  speech and language skills      Neurological Neurological Review of Symptoms: No symptoms reported    HEENT HEENT Symptoms Reported: No symptoms reported HEENT Self-Management Outcome: 4 (good)    Cardiovascular Is patient checking Blood Pressure at home?:  (Blood pressue cuff batteries are dead BP was good at appointment yesterday) Patient's Recent BP reading at home: 97/60 at appointment Cardiovascular Management Strategies: Medication therapy, Routine screening Cardiovascular Self-Management Outcome: 3 (uncertain) (feel pretty good)  Respiratory Respiratory Symptoms Reported: Shortness of breath Other Respiratory Symptoms: doing better Additional Respiratory Details: Also, stage IIIb non-small cell lung cancer being treated with weekly chemo and daily radiation, Uses oxygen  @ 2liters routinely - no new problems going to oncology appointment now Respiratory Management Strategies: Medication therapy, Routine screening, Oxygen  therapy Respiratory Self-Management Outcome: 3 (uncertain)  Endocrine Endocrine Symptoms Reported: No symptoms reported Is patient diabetic?: No Endocrine Self-Management Outcome: 4 (good)  Gastrointestinal Gastrointestinal Symptoms Reported: No symptoms reported Gastrointestinal Management Strategies: Medication therapy    Genitourinary Genitourinary Symptoms Reported: No symptoms reported    Integumentary Integumentary Symptoms Reported: Other Other Integumentary Symptoms: Pleural drain    Musculoskeletal Musculoskelatal Symptoms Reviewed: Difficulty walking, Weakness Additional Musculoskeletal Details: using walker, wife with me Musculoskeletal Management Strategies: Medical device, Routine screening      Psychosocial Psychosocial  Symptoms Reported: No symptoms reported Behavioral Management Strategies: Medication therapy Behavioral Health Self-Management Outcome: 4 (good)       There were no vitals filed for this visit. Pain Scale: 0-10 Pain  Score: 0-No pain  Medications Reviewed Today     Reviewed by Eilleen Richerd GRADE, RN (Registered Nurse) on 07/28/24 at 1036  Med List Status: <None>   Medication Order Taking? Sig Documenting Provider Last Dose Status Informant  albuterol  (PROVENTIL ) (2.5 MG/3ML) 0.083% nebulizer solution [id number] Yes INHALE ONE vial by NEBULIZER EVERY 6 HOURS AS NEEDED FOR WHEEZING OR SHORTNESS OF BREATH Dettinger, [name], MD  Active Spouse/Significant Other, Pharmacy Records  albuterol  (VENTOLIN  HFA) 108 (90 Base) MCG/ACT inhaler [id number] Yes Inhale 2 puffs into the lungs every 6 (six) hours as needed for wheezing or shortness of breath. [provider]  Active Spouse/Significant Other, Pharmacy Records  ALPRAZolam  (XANAX ) 0.5 MG tablet [id number] Yes Take 1 tablet (0.5 mg total) by mouth at bedtime as needed for sleep or anxiety. Dettinger, [name], MD  Active   amiodarone  (PACERONE ) 200 MG tablet [id number] Yes Take 1 tablet (200 mg total) by mouth daily. Tobie Yetta HERO, MD  Active   arformoterol  (BROVANA ) 15 MCG/2ML NEBU [id number] Yes Take 2 mLs (15 mcg total) by nebulization 2 (two) times daily. Charley Conger, PA-C  Active   Bempedoic Acid -Ezetimibe  (NEXLIZET ) 180-10 MG TABS [id number] Yes TAKE ONE TABLET DAILY Dettinger, [name], MD  Active Pharmacy Records, Spouse/Significant Other  ELIQUIS  5 MG TABS tablet [id number] Yes Take 5 mg by mouth 2 (two) times daily. [provider]  Active Spouse/Significant Other, Pharmacy Records  fluticasone  (FLONASE ) 50 MCG/ACT nasal spray [id number] Yes Place 2 sprays into both nostrils daily. [facility] [place] Hummer, Nena, NP  Active Pharmacy Records, Spouse/Significant Other  HYDROcodone  bit-homatropine [facility]) 5-1.5 MG/5ML syrup [id number] Yes Take 5 mLs by mouth 4 (four) times daily as needed for cough. Dettinger, [name], MD  Active   levothyroxine  (SYNTHROID ) 50 MCG tablet [id number] Yes Take 1 tablet (50 mcg total) by mouth daily before breakfast.  Pearlean Manus, MD  Active Spouse/Significant Other, Pharmacy Records  midodrine  (PROAMATINE ) 10 MG tablet [id number] Yes Take 1 tablet (10 mg total) by mouth 3 (three) times daily with meals. Call PCP if top blood pressure number remain high more than 140 to adjust the medicine. Tobie Yetta HERO, MD  Active   nitroGLYCERIN  (NITROSTAT ) 0.4 MG SL tablet [id number] Yes Place 1 tablet (0.4 mg total) under the tongue every 5 (five) minutes as needed for chest pain. Miriam Norris, NP  Active Spouse/Significant Other, Pharmacy Records  omeprazole  [facility]) 20 MG capsule [id number] Yes Take 1 capsule (20 mg total) by mouth daily. Pearlean Manus, MD  Active Spouse/Significant Other, Pharmacy Records  ondansetron  (ZOFRAN ) 8 MG tablet [id number] Yes Take 8 mg by mouth every 8 (eight) hours as needed for vomiting. [provider]  Active Spouse/Significant Other, Pharmacy Records  OXYGEN  [id number] Yes Inhale 2 L into the lungs daily. [provider]  Active Spouse/Significant Other, Pharmacy Records  prochlorperazine (COMPAZINE) 10 MG tablet [id number] Yes Take 10 mg by mouth every 8 (eight) hours as needed for nausea or vomiting. [provider]  Active Spouse/Significant Other, Pharmacy Records  traZODone  (DESYREL ) 100 MG tablet [id number] Yes Take 1 tablet (100 mg total) by mouth at bedtime. Dettinger, [name], MD  Active Spouse/Significant Other, Pharmacy Records            Recommendation:   Continue Current Plan of Care  Follow Up Plan:   Telephone follow-up in 1 week  Richerd Fish, RN, BSN, CCM [facility], [facility] RN Care Manager Direct Dial: [phone]           "

## 2024-08-04 ENCOUNTER — Telehealth

## 2024-08-08 ENCOUNTER — Ambulatory Visit

## 2024-09-07 ENCOUNTER — Ambulatory Visit (HOSPITAL_BASED_OUTPATIENT_CLINIC_OR_DEPARTMENT_OTHER)

## 2024-09-15 ENCOUNTER — Ambulatory Visit: Admitting: Internal Medicine

## 2024-11-29 ENCOUNTER — Ambulatory Visit: Admitting: Nurse Practitioner
